# Patient Record
Sex: Female | Born: 1935
Health system: Southern US, Community
[De-identification: ages and names within clinical notes are randomized; demographics above are authoritative.]

## PROBLEM LIST (undated history)

## (undated) DIAGNOSIS — N183 Chronic kidney disease, stage 3 unspecified: Secondary | ICD-10-CM

## (undated) DIAGNOSIS — E890 Postprocedural hypothyroidism: Secondary | ICD-10-CM

## (undated) DIAGNOSIS — M949 Disorder of cartilage, unspecified: Secondary | ICD-10-CM

## (undated) DIAGNOSIS — E785 Hyperlipidemia, unspecified: Secondary | ICD-10-CM

## (undated) DIAGNOSIS — M899 Disorder of bone, unspecified: Secondary | ICD-10-CM

## (undated) DIAGNOSIS — C73 Malignant neoplasm of thyroid gland: Secondary | ICD-10-CM

## (undated) DIAGNOSIS — J309 Allergic rhinitis, unspecified: Secondary | ICD-10-CM

## (undated) HISTORY — PX: CERVICAL POLYPECTOMY: SHX88

## (undated) HISTORY — DX: Postprocedural hypothyroidism: E89.0

## (undated) HISTORY — DX: Allergic rhinitis, unspecified: J30.9

## (undated) HISTORY — PX: ABDOMINAL HYSTERECTOMY: SHX81

## (undated) HISTORY — DX: Malignant neoplasm of thyroid gland: C73

## (undated) HISTORY — DX: Hyperlipidemia, unspecified: E78.5

## (undated) HISTORY — DX: Disorder of bone, unspecified: M89.9

## (undated) HISTORY — DX: Disorder of cartilage, unspecified: M94.9

---

## 1999-04-14 ENCOUNTER — Other Ambulatory Visit: Admission: RE | Admit: 1999-04-14 | Discharge: 1999-04-14 | Payer: Self-pay | Admitting: Internal Medicine

## 1999-11-09 ENCOUNTER — Ambulatory Visit (HOSPITAL_COMMUNITY): Admission: RE | Admit: 1999-11-09 | Discharge: 1999-11-09 | Payer: Self-pay | Admitting: Internal Medicine

## 1999-11-09 ENCOUNTER — Encounter: Payer: Self-pay | Admitting: Internal Medicine

## 2004-02-23 ENCOUNTER — Ambulatory Visit: Payer: Self-pay | Admitting: Internal Medicine

## 2004-03-11 ENCOUNTER — Ambulatory Visit: Payer: Self-pay | Admitting: Internal Medicine

## 2004-05-26 ENCOUNTER — Other Ambulatory Visit: Admission: RE | Admit: 2004-05-26 | Discharge: 2004-05-26 | Payer: Self-pay | Admitting: Obstetrics and Gynecology

## 2004-07-06 ENCOUNTER — Ambulatory Visit: Payer: Self-pay | Admitting: Internal Medicine

## 2004-08-09 ENCOUNTER — Encounter (INDEPENDENT_AMBULATORY_CARE_PROVIDER_SITE_OTHER): Payer: Self-pay | Admitting: *Deleted

## 2004-08-09 ENCOUNTER — Inpatient Hospital Stay (HOSPITAL_COMMUNITY): Admission: RE | Admit: 2004-08-09 | Discharge: 2004-08-11 | Payer: Self-pay | Admitting: Obstetrics and Gynecology

## 2004-10-03 ENCOUNTER — Ambulatory Visit: Payer: Self-pay | Admitting: Internal Medicine

## 2005-02-09 ENCOUNTER — Ambulatory Visit: Payer: Self-pay | Admitting: Internal Medicine

## 2005-05-08 ENCOUNTER — Ambulatory Visit: Payer: Self-pay | Admitting: Internal Medicine

## 2005-06-01 ENCOUNTER — Ambulatory Visit: Payer: Self-pay | Admitting: Cardiology

## 2005-06-05 ENCOUNTER — Ambulatory Visit: Payer: Self-pay | Admitting: Internal Medicine

## 2005-06-12 ENCOUNTER — Other Ambulatory Visit: Admission: RE | Admit: 2005-06-12 | Discharge: 2005-06-12 | Payer: Self-pay | Admitting: Interventional Radiology

## 2005-06-12 ENCOUNTER — Encounter: Admission: RE | Admit: 2005-06-12 | Discharge: 2005-06-12 | Payer: Self-pay | Admitting: Internal Medicine

## 2005-06-12 ENCOUNTER — Encounter (INDEPENDENT_AMBULATORY_CARE_PROVIDER_SITE_OTHER): Payer: Self-pay | Admitting: *Deleted

## 2005-06-20 ENCOUNTER — Ambulatory Visit: Payer: Self-pay | Admitting: Internal Medicine

## 2005-08-01 ENCOUNTER — Other Ambulatory Visit: Admission: RE | Admit: 2005-08-01 | Discharge: 2005-08-01 | Payer: Self-pay | Admitting: Obstetrics and Gynecology

## 2005-08-21 ENCOUNTER — Encounter (INDEPENDENT_AMBULATORY_CARE_PROVIDER_SITE_OTHER): Payer: Self-pay | Admitting: *Deleted

## 2005-08-21 ENCOUNTER — Inpatient Hospital Stay (HOSPITAL_COMMUNITY): Admission: RE | Admit: 2005-08-21 | Discharge: 2005-08-22 | Payer: Self-pay | Admitting: Surgery

## 2005-09-27 ENCOUNTER — Ambulatory Visit: Payer: Self-pay | Admitting: Endocrinology

## 2005-11-22 ENCOUNTER — Encounter (INDEPENDENT_AMBULATORY_CARE_PROVIDER_SITE_OTHER): Payer: Self-pay | Admitting: *Deleted

## 2005-11-22 ENCOUNTER — Observation Stay (HOSPITAL_COMMUNITY): Admission: RE | Admit: 2005-11-22 | Discharge: 2005-11-23 | Payer: Self-pay | Admitting: Surgery

## 2005-12-13 ENCOUNTER — Ambulatory Visit: Payer: Self-pay | Admitting: Endocrinology

## 2005-12-26 ENCOUNTER — Ambulatory Visit: Payer: Self-pay | Admitting: Endocrinology

## 2006-01-08 ENCOUNTER — Ambulatory Visit: Payer: Self-pay | Admitting: Endocrinology

## 2006-01-16 ENCOUNTER — Encounter: Admission: RE | Admit: 2006-01-16 | Discharge: 2006-01-16 | Payer: Self-pay | Admitting: Endocrinology

## 2006-02-15 ENCOUNTER — Ambulatory Visit: Payer: Self-pay | Admitting: Internal Medicine

## 2006-02-19 ENCOUNTER — Encounter: Admission: RE | Admit: 2006-02-19 | Discharge: 2006-02-19 | Payer: Self-pay | Admitting: Endocrinology

## 2006-02-26 ENCOUNTER — Ambulatory Visit: Payer: Self-pay | Admitting: Endocrinology

## 2006-04-30 ENCOUNTER — Ambulatory Visit: Payer: Self-pay | Admitting: Endocrinology

## 2006-06-05 ENCOUNTER — Ambulatory Visit: Payer: Self-pay | Admitting: Endocrinology

## 2006-06-05 LAB — CONVERTED CEMR LAB: TSH: 25.33 microintl units/mL — ABNORMAL HIGH (ref 0.35–5.50)

## 2006-07-03 ENCOUNTER — Ambulatory Visit: Payer: Self-pay | Admitting: Internal Medicine

## 2006-07-23 ENCOUNTER — Ambulatory Visit: Payer: Self-pay | Admitting: Endocrinology

## 2006-07-23 LAB — CONVERTED CEMR LAB
Calcium, Total (PTH): 8.2 mg/dL — ABNORMAL LOW (ref 8.4–10.5)
PTH: 7.5 pg/mL — ABNORMAL LOW (ref 14.0–72.0)
TSH: 0.11 microintl units/mL — ABNORMAL LOW (ref 0.35–5.50)

## 2006-08-27 ENCOUNTER — Ambulatory Visit: Payer: Self-pay | Admitting: Endocrinology

## 2006-09-12 ENCOUNTER — Ambulatory Visit: Payer: Self-pay | Admitting: Endocrinology

## 2006-09-12 LAB — CONVERTED CEMR LAB: Thyroglobulin Ab: <30 (ref 0.0–60.0)

## 2006-09-18 ENCOUNTER — Encounter: Admission: RE | Admit: 2006-09-18 | Discharge: 2006-09-18 | Payer: Self-pay | Admitting: Endocrinology

## 2006-09-25 ENCOUNTER — Ambulatory Visit: Payer: Self-pay | Admitting: Endocrinology

## 2006-11-02 DIAGNOSIS — M899 Disorder of bone, unspecified: Secondary | ICD-10-CM

## 2006-11-02 DIAGNOSIS — J309 Allergic rhinitis, unspecified: Secondary | ICD-10-CM | POA: Insufficient documentation

## 2006-11-02 DIAGNOSIS — M949 Disorder of cartilage, unspecified: Secondary | ICD-10-CM

## 2006-11-02 HISTORY — DX: Disorder of bone, unspecified: M89.9

## 2006-11-02 HISTORY — DX: Allergic rhinitis, unspecified: J30.9

## 2006-11-04 DIAGNOSIS — E785 Hyperlipidemia, unspecified: Secondary | ICD-10-CM | POA: Insufficient documentation

## 2006-11-04 HISTORY — DX: Hyperlipidemia, unspecified: E78.5

## 2006-12-20 ENCOUNTER — Ambulatory Visit: Payer: Self-pay | Admitting: Endocrinology

## 2006-12-20 LAB — CONVERTED CEMR LAB: TSH: 0.03 microintl units/mL — ABNORMAL LOW (ref 0.35–5.50)

## 2007-01-23 ENCOUNTER — Ambulatory Visit: Payer: Self-pay | Admitting: Internal Medicine

## 2007-03-11 ENCOUNTER — Ambulatory Visit: Payer: Self-pay | Admitting: Internal Medicine

## 2007-03-11 ENCOUNTER — Telehealth: Payer: Self-pay | Admitting: Internal Medicine

## 2007-03-14 ENCOUNTER — Encounter: Payer: Self-pay | Admitting: Internal Medicine

## 2007-03-14 ENCOUNTER — Ambulatory Visit: Payer: Self-pay

## 2007-03-18 ENCOUNTER — Telehealth: Payer: Self-pay | Admitting: Internal Medicine

## 2007-03-28 ENCOUNTER — Ambulatory Visit: Payer: Self-pay | Admitting: Endocrinology

## 2007-03-28 DIAGNOSIS — E890 Postprocedural hypothyroidism: Secondary | ICD-10-CM

## 2007-03-28 DIAGNOSIS — C73 Malignant neoplasm of thyroid gland: Secondary | ICD-10-CM | POA: Insufficient documentation

## 2007-03-28 DIAGNOSIS — E209 Hypoparathyroidism, unspecified: Secondary | ICD-10-CM | POA: Insufficient documentation

## 2007-03-28 HISTORY — DX: Postprocedural hypothyroidism: E89.0

## 2007-03-28 HISTORY — DX: Malignant neoplasm of thyroid gland: C73

## 2007-04-01 ENCOUNTER — Ambulatory Visit: Payer: Self-pay | Admitting: Internal Medicine

## 2007-04-03 ENCOUNTER — Telehealth: Payer: Self-pay | Admitting: Endocrinology

## 2007-04-08 ENCOUNTER — Encounter (HOSPITAL_COMMUNITY): Admission: RE | Admit: 2007-04-08 | Discharge: 2007-04-10 | Payer: Self-pay | Admitting: Endocrinology

## 2007-04-11 ENCOUNTER — Encounter (HOSPITAL_COMMUNITY): Admission: RE | Admit: 2007-04-11 | Discharge: 2007-04-12 | Payer: Self-pay | Admitting: Endocrinology

## 2007-07-02 ENCOUNTER — Ambulatory Visit: Payer: Self-pay | Admitting: Internal Medicine

## 2007-09-24 ENCOUNTER — Ambulatory Visit: Payer: Self-pay | Admitting: Endocrinology

## 2007-09-24 LAB — CONVERTED CEMR LAB: TSH: 0.02 microintl units/mL — ABNORMAL LOW (ref 0.35–5.50)

## 2007-09-27 ENCOUNTER — Telehealth: Payer: Self-pay | Admitting: Internal Medicine

## 2007-11-06 ENCOUNTER — Ambulatory Visit: Payer: Self-pay | Admitting: Internal Medicine

## 2007-11-08 LAB — CONVERTED CEMR LAB
CO2: 33 meq/L — ABNORMAL HIGH (ref 19–32)
Chloride: 101 meq/L (ref 96–112)
Creatinine, Ser: 0.9 mg/dL (ref 0.4–1.2)
GFR calc Af Amer: 78 mL/min
GFR calc non Af Amer: 65 mL/min
HDL: 64.8 mg/dL (ref >39.0)
Sodium: 140 meq/L (ref 135–145)
TSH: 0.09 microintl units/mL — ABNORMAL LOW (ref 0.35–5.50)
Total CHOL/HDL Ratio: 3.2
Triglycerides: 72 mg/dL (ref 0–149)
VLDL: 14 mg/dL (ref 0–40)

## 2008-02-18 ENCOUNTER — Ambulatory Visit: Payer: Self-pay | Admitting: Internal Medicine

## 2008-03-23 ENCOUNTER — Ambulatory Visit: Payer: Self-pay | Admitting: Endocrinology

## 2008-03-23 LAB — CONVERTED CEMR LAB: TSH: 0.04 microintl units/mL — ABNORMAL LOW (ref 0.35–5.50)

## 2008-03-27 LAB — CONVERTED CEMR LAB: Calcium, Total (PTH): 8.7 mg/dL (ref 8.4–10.5)

## 2008-04-08 ENCOUNTER — Telehealth: Payer: Self-pay | Admitting: Internal Medicine

## 2008-04-15 ENCOUNTER — Encounter: Payer: Self-pay | Admitting: Internal Medicine

## 2008-05-21 ENCOUNTER — Ambulatory Visit: Payer: Self-pay | Admitting: Internal Medicine

## 2008-08-30 LAB — HM MAMMOGRAPHY

## 2008-09-08 LAB — CONVERTED CEMR LAB

## 2008-09-10 ENCOUNTER — Telehealth: Payer: Self-pay | Admitting: Internal Medicine

## 2008-09-29 ENCOUNTER — Encounter: Payer: Self-pay | Admitting: Internal Medicine

## 2008-09-30 ENCOUNTER — Ambulatory Visit: Payer: Self-pay | Admitting: Endocrinology

## 2008-09-30 LAB — CONVERTED CEMR LAB
Calcium, Total (PTH): 8.2 mg/dL — ABNORMAL LOW (ref 8.4–10.5)
PTH: 21.5 pg/mL (ref 14.0–72.0)
TSH: 0.13 microintl units/mL — ABNORMAL LOW (ref 0.35–5.50)

## 2008-11-25 ENCOUNTER — Ambulatory Visit: Payer: Self-pay | Admitting: Internal Medicine

## 2008-12-07 ENCOUNTER — Telehealth: Payer: Self-pay | Admitting: Internal Medicine

## 2008-12-11 ENCOUNTER — Encounter: Payer: Self-pay | Admitting: Internal Medicine

## 2009-01-14 ENCOUNTER — Ambulatory Visit: Payer: Self-pay | Admitting: Internal Medicine

## 2009-03-24 ENCOUNTER — Ambulatory Visit: Payer: Self-pay | Admitting: Endocrinology

## 2009-03-24 LAB — CONVERTED CEMR LAB: TSH: 0.02 microintl units/mL — ABNORMAL LOW (ref 0.35–5.50)

## 2009-03-29 ENCOUNTER — Encounter: Admission: RE | Admit: 2009-03-29 | Discharge: 2009-03-29 | Payer: Self-pay | Admitting: Endocrinology

## 2009-03-31 ENCOUNTER — Telehealth: Payer: Self-pay | Admitting: Endocrinology

## 2009-04-15 ENCOUNTER — Telehealth: Payer: Self-pay | Admitting: Endocrinology

## 2009-04-16 ENCOUNTER — Telehealth: Payer: Self-pay | Admitting: Endocrinology

## 2009-05-26 ENCOUNTER — Ambulatory Visit: Payer: Self-pay | Admitting: Internal Medicine

## 2009-05-27 LAB — CONVERTED CEMR LAB: TSH: 0.01 microintl units/mL — ABNORMAL LOW (ref 0.35–5.50)

## 2009-05-28 ENCOUNTER — Telehealth: Payer: Self-pay | Admitting: Endocrinology

## 2009-08-23 ENCOUNTER — Ambulatory Visit: Payer: Self-pay | Admitting: Internal Medicine

## 2009-08-26 ENCOUNTER — Ambulatory Visit: Payer: Self-pay | Admitting: Family Medicine

## 2009-09-27 ENCOUNTER — Ambulatory Visit: Payer: Self-pay | Admitting: Family Medicine

## 2009-09-27 DIAGNOSIS — L02419 Cutaneous abscess of limb, unspecified: Secondary | ICD-10-CM | POA: Insufficient documentation

## 2009-09-27 DIAGNOSIS — L03119 Cellulitis of unspecified part of limb: Secondary | ICD-10-CM

## 2009-09-28 ENCOUNTER — Ambulatory Visit: Payer: Self-pay | Admitting: Endocrinology

## 2009-09-28 ENCOUNTER — Ambulatory Visit: Payer: Self-pay | Admitting: Family Medicine

## 2009-09-28 LAB — CONVERTED CEMR LAB: Magnesium: 2.2 mg/dL (ref 1.5–2.5)

## 2009-09-29 ENCOUNTER — Encounter: Payer: Self-pay | Admitting: Endocrinology

## 2009-09-29 LAB — CONVERTED CEMR LAB
PTH: 28.2 pg/mL (ref 14.0–72.0)
Thyroglobulin Ab: <30 (ref 0.0–60.0)
Thyroperoxidase Ab SerPl-aCnc: 40.9 (ref 0.0–60.0)

## 2009-09-30 ENCOUNTER — Ambulatory Visit: Payer: Self-pay | Admitting: Family Medicine

## 2009-10-18 ENCOUNTER — Encounter: Admission: RE | Admit: 2009-10-18 | Discharge: 2009-10-18 | Payer: Self-pay | Admitting: Obstetrics and Gynecology

## 2009-11-03 ENCOUNTER — Encounter: Payer: Self-pay | Admitting: Internal Medicine

## 2009-11-03 ENCOUNTER — Ambulatory Visit: Payer: Self-pay | Admitting: Endocrinology

## 2009-11-04 LAB — CONVERTED CEMR LAB
Calcium, Total (PTH): 8.1 mg/dL — ABNORMAL LOW (ref 8.4–10.5)
PTH: 16.9 pg/mL (ref 14.0–72.0)

## 2009-12-01 ENCOUNTER — Encounter: Payer: Self-pay | Admitting: Family Medicine

## 2009-12-01 ENCOUNTER — Ambulatory Visit: Payer: Self-pay | Admitting: Family Medicine

## 2009-12-01 DIAGNOSIS — L0291 Cutaneous abscess, unspecified: Secondary | ICD-10-CM | POA: Insufficient documentation

## 2009-12-01 DIAGNOSIS — L0390 Cellulitis, unspecified: Secondary | ICD-10-CM

## 2009-12-03 ENCOUNTER — Ambulatory Visit: Payer: Self-pay | Admitting: Family Medicine

## 2009-12-03 DIAGNOSIS — IMO0002 Reserved for concepts with insufficient information to code with codable children: Secondary | ICD-10-CM | POA: Insufficient documentation

## 2009-12-04 ENCOUNTER — Encounter: Payer: Self-pay | Admitting: Internal Medicine

## 2010-01-11 ENCOUNTER — Ambulatory Visit: Payer: Self-pay | Admitting: Family Medicine

## 2010-02-23 ENCOUNTER — Ambulatory Visit: Payer: Self-pay | Admitting: Endocrinology

## 2010-02-23 LAB — CONVERTED CEMR LAB: Calcium, Total (PTH): 7.9 mg/dL — ABNORMAL LOW (ref 8.4–10.5)

## 2010-03-15 ENCOUNTER — Telehealth: Payer: Self-pay | Admitting: Internal Medicine

## 2010-05-01 ENCOUNTER — Encounter: Payer: Self-pay | Admitting: Endocrinology

## 2010-05-01 ENCOUNTER — Encounter: Payer: Self-pay | Admitting: Obstetrics and Gynecology

## 2010-05-12 NOTE — Assessment & Plan Note (Signed)
Summary: 2 DAY ROV/NJR   Vital Signs:  Patient profile:   77 year old female Weight:      144 pounds Temp:     97.8 degrees F oral BP sitting:   120 / 80  (left arm) Cuff size:   regular  Vitals Entered By: Duard Brady LPN (September 30, 2009 8:24 AM) CC: rov - cellulitis improving  Is Patient Diabetic? No   History of Present Illness: Pt here for f/u cellulitis thigh and buttock with draining abscess of buttock. Overall less pain.  Still some drainage but decreasing. No fever or chills.  Taking antibiotic and no adverse side effects. No new rashes.  Allergies (verified): No Known Drug Allergies  Past History:  Past Medical History: Last updated: 03/24/2009 MALIGNANT NEOPLASM OF THYROID GLAND, stage 2, follicular (ICD-193) 5/07:  right lobectomy--path shows 3 cm follicular ca 8/07:  completion left lobectomy, no more tumor seen 9/07  thyroglobulin 11.8 (ab neg) 11/07: i-131 rx, [facility] 6/08 thyroglogulin 0.2 (ab neg) 6/08: hypothyroid body scan neg (clinically tolerated hypothyroidism poorly) 6/09 tg undetectable (ab=1.5, normal) 12/10: tg undetectable (ab neg) HYPOTHYROIDISM, POSTSURGICAL (ICD-244.0) HYPOPARATHYROIDISM (ICD-252.1) LEG PAIN (ICD-729.5) HYPERLIPIDEMIA (ICD-272.4) OSTEOPENIA (ICD-733.90) ALLERGIC RHINITIS (ICD-477.9)  Review of Systems      See HPI  Physical Exam  General:  Well-developed,well-nourished,in no acute distress; alert,appropriate and cooperative throughout examination Heart:  normal rate and regular rhythm.   Skin:  greatly improved cellulits.  Erythema thigh almost resolved.  Buttock lesion not draining today.  Much less tender.   Impression & Recommendations:  Problem # 1:  CELLULITIS, THIGH (ICD-682.6) Assessment Improved finish out Keflex.  F/u with Dr Cato Mulligan as scheduled for med f/u.  Complete Medication List: 1)  Caltrate 600+d 600-400 Mg-unit Tabs (Calcium carbonate-vitamin d) .... Two times a day 2)  Zovirax 5 % Crea  (Acyclovir) .... Apply to lesion three times a day as needed outbreak of rash 3)  Centrum Silver Tabs (Multiple vitamins-minerals) .... Once a day 4)  Levothyroxine Sodium 100 Mcg Tabs (Levothyroxine sodium) .Marland Kitchen.. 1 once daily 5)  Calcitriol 0.25 Mcg Caps (Calcitriol) .Marland Kitchen.. 1 once daily  Patient Instructions: 1)  Continue sitz baths with epsom salts for another couple of days then should  be OK.
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
## 2010-05-12 NOTE — Assessment & Plan Note (Signed)
Summary: PAIN / SORE ON L ARM (INFECTED?) // RS   Vital Signs:  Patient profile:   77 year old female Temp:     97.5 degrees F oral BP sitting:   130 / 84  (left arm) Cuff size:   regular  Vitals Entered By: Sid Falcon LPN (Aug 26, 2009 11:52 AM) CC: Left arm red and itiching   History of Present Illness: L arm red since Monday this week.  Noted after friend's dog scratched arm.  No dog bite.  No fever.  tried peroxide and alcohol. redness spreading past couple of days. tetanus 2010.  Allergies (verified): No Known Drug Allergies  Past History:  Past Medical History: Last updated: 03/24/2009 MALIGNANT NEOPLASM OF THYROID GLAND, stage 2, follicular (ICD-193) 5/07:  right lobectomy--path shows 3 cm follicular ca 8/07:  completion left lobectomy, no more tumor seen 9/07  thyroglobulin 11.8 (ab neg) 11/07: i-131 rx, 101 [facility] 6/08 thyroglogulin 0.2 (ab neg) 6/08: hypothyroid body scan neg (clinically tolerated hypothyroidism poorly) 6/09 tg undetectable (ab=1.5, normal) 12/10: tg undetectable (ab neg) HYPOTHYROIDISM, POSTSURGICAL (ICD-244.0) HYPOPARATHYROIDISM (ICD-252.1) LEG PAIN (ICD-729.5) HYPERLIPIDEMIA (ICD-272.4) OSTEOPENIA (ICD-733.90) ALLERGIC RHINITIS (ICD-477.9) PMH reviewed for relevance  Review of Systems      See HPI  Physical Exam  General:  Well-developed,well-nourished,in no acute distress; alert,appropriate and cooperative throughout examination Lungs:  Normal respiratory effort, chest expands symmetrically. Lungs are clear to auscultation, no crackles or wheezes. Heart:  normal rate and regular rhythm.   Skin:  L prox forearm 5 X 7 cm area of erythema with mild tenderness.No drainage.  No pustules or vesicles.   Impression & Recommendations:  Problem # 1:  CELLULITIS AND ABSCESS OF UPPER ARM AND FOREARM (ICD-682.3)  Her updated medication list for this problem includes:    Cephalexin 500 Mg Caps (Cephalexin) ..... One by mouth three times a  day for 10 days  Orders: Prescription Created Electronically [phone])  Complete Medication List: 1)  Caltrate 600+d 600-400 Mg-unit Tabs (Calcium carbonate-vitamin d) .... Two times a day 2)  Zovirax 5 % Crea (Acyclovir) .... Apply to lesion three times a day as needed outbreak of rash 3)  Centrum Silver Tabs (Multiple vitamins-minerals) .... Once a day 4)  Levothyroxine Sodium 50 Mcg Tabs (Levothyroxine sodium) .... Take 1 tablet by mouth once a day 5)  Cephalexin 500 Mg Caps (Cephalexin) .... One by mouth three times a day for 10 days  Patient Instructions: 1)  use heating pad 2-3 times per day to forearm. 2)  Take your antibiotic as prescribed until ALL of it is gone, but stop if you develop a rash or swelling and contact our office as soon as possible.  3)  Follow up if you notice any progressive redness or fever. Prescriptions: CEPHALEXIN 500 MG CAPS (CEPHALEXIN) one by mouth three times a day for 10 days  #30 x 0   Entered and Authorized by:   Evelena Peat MD   Signed by:   Evelena Peat MD on 08/26/2009   Method used:   Electronically to        [facility] (retail)       [address]       [place], [place]  [address]       Ph: [phone]       Fax: [phone]   RxID:   [id number]

## 2010-05-12 NOTE — Assessment & Plan Note (Signed)
Summary: next day fup per dr burchette//ccm   Vital Signs:  Patient profile:   77 year old female Temp:     98.0 degrees F oral BP sitting:   150 / 80  (left arm) Cuff size:   regular  Vitals Entered By: Sid Falcon LPN (September 28, 2009 10:55 AM) CC: 1 day follow-up   History of Present Illness: Pt here for f/u cellulitis and abscess R buttock and cellulitis R ant thigh. Feels "100% better" today.  No fever or chills.  Cont to have some dranage. Less pain.  Able to sit better today. No nausea or vomiting.  Rocephin and started Keflex yesterday.  Allergies: No Known Drug Allergies  Past History:  Past Medical History: Last updated: 03/24/2009 MALIGNANT NEOPLASM OF THYROID GLAND, stage 2, follicular (ICD-193) 5/07:  right lobectomy--path shows 3 cm follicular ca 8/07:  completion left lobectomy, no more tumor seen 9/07  thyroglobulin 11.8 (ab neg) 11/07: i-131 rx, 101 [facility] 6/08 thyroglogulin 0.2 (ab neg) 6/08: hypothyroid body scan neg (clinically tolerated hypothyroidism poorly) 6/09 tg undetectable (ab=1.5, normal) 12/10: tg undetectable (ab neg) HYPOTHYROIDISM, POSTSURGICAL (ICD-244.0) HYPOPARATHYROIDISM (ICD-252.1) LEG PAIN (ICD-729.5) HYPERLIPIDEMIA (ICD-272.4) OSTEOPENIA (ICD-733.90) ALLERGIC RHINITIS (ICD-477.9)  Review of Systems      See HPI  Physical Exam  General:  Well-developed,well-nourished,in no acute distress; alert,appropriate and cooperative throughout examination Lungs:  Normal respiratory effort, chest expands symmetrically. Lungs are clear to auscultation, no crackles or wheezes. Heart:  normal rate and regular rhythm.   Skin:  R ant thigh less erythema and less tender.  R buttock-draining abscess with less drainage today than yesterday.  Erythema is less.   Impression & Recommendations:  Problem # 1:  CELLULITIS, THIGH (ICD-682.6) Assessment Improved Cont heating pad and oral antibiotics. Her updated medication list for this problem  includes:    Cephalexin 500 Mg Caps (Cephalexin) ..... One by mouth three times a day for 10 days  Complete Medication List: 1)  Caltrate 600+d 600-400 Mg-unit Tabs (Calcium carbonate-vitamin d) .... Two times a day 2)  Zovirax 5 % Crea (Acyclovir) .... Apply to lesion three times a day as needed outbreak of rash 3)  Centrum Silver Tabs (Multiple vitamins-minerals) .... Once a day 4)  Levothyroxine Sodium 50 Mcg Tabs (Levothyroxine sodium) .... Take 1 tablet by mouth once a day 5)  Cephalexin 500 Mg Caps (Cephalexin) .... One by mouth three times a day for 10 days  Patient Instructions: 1)  Consider daily bath with epsom salts to help with drainage. 2)  Change dressing daily. 3)  Office follow up in 2 days to reassess. 4)  Follow up sooner if you notice any fever or worsening rash.

## 2010-05-12 NOTE — Assessment & Plan Note (Signed)
Summary: Has a sore on hip/? insect bite/red around area/cjr   Vital Signs:  Patient profile:   77 year old female Weight:      145 pounds Temp:     98.7 degrees F oral BP sitting:   148 / 88  (left arm) Cuff size:   regular  Vitals Entered By: Sid Falcon LPN (September 27, 2009 3:32 PM) CC: Sore on right hip   History of Present Illness: Patient is seen with sore area right buttock for approximately one week. She initially thought was an insect bite. Earlier today had some spontaneous drainage of bleeding and pus. She denies any fever or chills. Also noticed earlier today red tender or warm spot right upper thigh. No history of MRSA. Recently placed on cephalexin for another unrelated problem and did well with that antibiotic.  Allergies (verified): No Known Drug Allergies  Past History:  Past Medical History: Last updated: 03/24/2009 MALIGNANT NEOPLASM OF THYROID GLAND, stage 2, follicular (ICD-193) 5/07:  right lobectomy--path shows 3 cm follicular ca 8/07:  completion left lobectomy, no more tumor seen 9/07  thyroglobulin 11.8 (ab neg) 11/07: i-131 rx, 101 [facility] 6/08 thyroglogulin 0.2 (ab neg) 6/08: hypothyroid body scan neg (clinically tolerated hypothyroidism poorly) 6/09 tg undetectable (ab=1.5, normal) 12/10: tg undetectable (ab neg) HYPOTHYROIDISM, POSTSURGICAL (ICD-244.0) HYPOPARATHYROIDISM (ICD-252.1) LEG PAIN (ICD-729.5) HYPERLIPIDEMIA (ICD-272.4) OSTEOPENIA (ICD-733.90) ALLERGIC RHINITIS (ICD-477.9)  Past Surgical History: Last updated: 11/02/2006 Hysterectomy Cervical polyps Colonoscopy-08/05/2003  Family History: Last updated: 11/06/2007 Family History Diabetes 1st degree relative---sister  Social History: Last updated: 05/26/2009 Married--widowed Never Smoked doesn't drive  Risk Factors: Smoking Status: never (05/26/2009) PMH-FH-SH reviewed for relevance  Review of Systems  The patient denies anorexia, fever, weight loss, abdominal pain,  hematuria, incontinence, muscle weakness, difficulty walking, and enlarged lymph nodes.    Physical Exam  General:  Well-developed,well-nourished,in no acute distress; alert,appropriate and cooperative throughout examination Lungs:  Normal respiratory effort, chest expands symmetrically. Lungs are clear to auscultation, no crackles or wheezes. Heart:  normal rate and regular rhythm.   Skin:  right buttock reveals she has area 7 x 12 centimeters of erythema. Tender to palpation and she has an area that is spontaneously draining and further purulence is expressed but there is no significant fluctuance. There is a little bloody drainage as well. Right anterior upper thigh reveals 9-by 15 cm area of erythema and slight tenderness and warmth. No pustules or evidence for abscess in this region.   Impression & Recommendations:  Problem # 1:  CELLULITIS, THIGH (ICD-682.6) Assessment New  patient has fairly severe cellulitis involving right thigh and buttock area with spontaneously draining abscess in the buttock region. Rocephin 1 g IM is given and start back Keflex 500 mg t.i.d. for 10 days and reassess tomorrow.  pt does not appear toxic. Her updated medication list for this problem includes:    Cephalexin 500 Mg Caps (Cephalexin) ..... One by mouth three times a day for 10 days  Orders: Rocephin  250mg  (Z6109) Admin of Therapeutic Inj  intramuscular or subcutaneous (60454)  Complete Medication List: 1)  Caltrate 600+d 600-400 Mg-unit Tabs (Calcium carbonate-vitamin d) .... Two times a day 2)  Zovirax 5 % Crea (Acyclovir) .... Apply to lesion three times a day as needed outbreak of rash 3)  Centrum Silver Tabs (Multiple vitamins-minerals) .... Once a day 4)  Levothyroxine Sodium 50 Mcg Tabs (Levothyroxine sodium) .... Take 1 tablet by mouth once a day 5)  Cephalexin 500 Mg Caps (Cephalexin) .... One by  mouth three times a day for 10 days  Patient Instructions: 1)  Schedule followup tomorrow  to reassess 2)  Use heating pad on low to moderate heat right thigh and right buttock area for the next few days 3)  start back cephalexin one tablet 3 times daily Prescriptions: CEPHALEXIN 500 MG CAPS (CEPHALEXIN) one by mouth three times a day for 10 days  #30 x 0   Entered and Authorized by:   Evelena Peat MD   Signed by:   Evelena Peat MD on 09/27/2009   Method used:   Electronically to        [facility] (retail)       [address]       [place], [place]  [address]       Ph: [phone]       Fax: [phone]   RxID:   [id number]    Medication Administration  Injection # 1:    Medication: Rocephin  250mg     Diagnosis: CELLULITIS, THIGH (ICD-682.6)    Route: IM    Site: RUOQ gluteus    Exp Date: 02/09/2012    Lot #: [id number]    Mfr: Sander Radon Plus    Patient tolerated injection without complications    Given by: Sid Falcon LPN (September 27, 2009 4:39 PM)  Orders Added: 1)  Est. Patient Level IV [40102] 2)  Rocephin  250mg  [J0696] 3)  Admin of Therapeutic Inj  intramuscular or subcutaneous [72536]

## 2010-05-12 NOTE — Progress Notes (Signed)
Summary: OTC med question  Phone Note Call from Patient Call back at [facility] Phone [phone]   Summary of Call: Patient called the office still c/o runny nose and sore throat stating that she was told by her drugist to get non-drowsy Claritin/Loratadine. She wants to know if this is ok per MD. Please advise. Initial call taken by: Lucious Groves,  April 16, 2009 4:00 PM  Follow-up for Phone Call        ok Follow-up by: Minus Breeding MD,  April 16, 2009 4:10 PM  Additional Follow-up for Phone Call Additional follow up Details #1::        Patient notified. Additional Follow-up by: Lucious Groves,  April 16, 2009 4:21 PM

## 2010-05-12 NOTE — Assessment & Plan Note (Signed)
Summary: flu shot//ccm  Nurse Visit   Allergies: No Known Drug Allergies  Immunizations Administered:  Influenza Vaccine # 1:    Vaccine Type: Fluvax MCR    Site: left deltoid    Mfr: GlaxoSmithKline    Dose: 0.5 ml    Route: IM    Given by: Kathrynn Speed CMA    Exp. Date: 10/08/2010    Lot #: [id number]    VIS given: 11/02/09 version given January 11, 2010.  Flu Vaccine Consent Questions:    Do you have a history of severe allergic reactions to this vaccine? no    Any prior history of allergic reactions to egg and/or gelatin? no    Do you have a sensitivity to the preservative Thimersol? no    Do you have a past history of Guillan-Barre Syndrome? no    Do you currently have an acute febrile illness? no    Have you ever had a severe reaction to latex? no    Vaccine information given and explained to patient? yes    Are you currently pregnant? no  Orders Added: 1)  Influenza Vaccine MCR [00025]

## 2010-05-12 NOTE — Assessment & Plan Note (Signed)
Summary: 2 DAY ROV/NJR   Vital Signs:  Patient profile:   77 year old female Weight:      140 pounds Temp:     97.9 degrees F oral BP sitting:   148 / 78  (left arm) Cuff size:   regular  Vitals Entered By: Sid Falcon LPN (December 03, 2009 11:44 AM) CC: 2 day follow-up   History of Present Illness: Patient seen for followup abscess buttock which has been draining some spontaneously. When seen 2 days ago this was not draining but indurated and nonfluctuant. We started doxycycline to cover for possible methicillin-resistant staph. Overall improved today with less pain and swelling. She is using saltwater soaks twice daily.  Since 2 days ago has developed small erythematous nodule with pustular center right axilla. Minimally painful.  No drainage.  Using heat topically.  Allergies (verified): No Known Drug Allergies  Past History:  Past Medical History: Last updated: 09/28/2009 MALIGNANT NEOPLASM OF THYROID GLAND, stage 2, follicular (ICD-193) 5/07:  right lobectomy--path shows 3 cm follicular ca 8/07:  completion left lobectomy, no more tumor seen 9/07  thyroglobulin 11.8 (ab neg) 11/07: i-131 rx, 101 [facility] 6/08 thyroglogulin 0.2 (ab neg) 6/08: hypothyroid body scan neg (clinically tolerated hypothyroidism poorly) 6/09 tg undetectable (ab=1.5, normal) 12/10: tg undetectable (ab neg) 6/11:  tg undetectable (ab neg) HYPOTHYROIDISM, POSTSURGICAL (ICD-244.0) HYPOPARATHYROIDISM (ICD-252.1) LEG PAIN (ICD-729.5) HYPERLIPIDEMIA (ICD-272.4) OSTEOPENIA (ICD-733.90) ALLERGIC RHINITIS (ICD-477.9) PMH reviewed for relevance  Review of Systems  The patient denies anorexia, fever, and weight loss.    Physical Exam  General:  Well-developed,well-nourished,in no acute distress; alert,appropriate and cooperative throughout examination Lungs:  Normal respiratory effort, chest expands symmetrically. Lungs are clear to auscultation, no crackles or wheezes. Heart:  normal rate and  regular rhythm.   Extremities:  right axillary region reveals about 1/2 cm area of erythema and slight swelling in induration. Very small pustular center which is unroofed with needle and cultured.  Minimal purulence with pressure. Skin:  right buttock reveals spontaneously draining wound with very minimal drainage at this time. Much less erythema and tenderness compared to 2 days ago. No fluctuance.   Impression & Recommendations:  Problem # 1:  ABSCESS, AXILLA, RIGHT (ICD-682.3) Assessment New this was opened with syringe and cultured to rule out MRSA. Continue doxycycline and warm soaks. Her updated medication list for this problem includes:    Doxycycline Hyclate 100 Mg Caps (Doxycycline hyclate) ..... One by mouth two times a day for 10 days  Orders: T-Culture, Wound ([id number]) I&D Abscess, Simple / Single (10060)  Problem # 2:  CELLULITIS AND ABSCESS OF BUTTOCK (ICD-682.5) Assessment: Improved follow up with Dr Cato Mulligan next week. Her updated medication list for this problem includes:    Doxycycline Hyclate 100 Mg Caps (Doxycycline hyclate) ..... One by mouth two times a day for 10 days  Complete Medication List: 1)  Caltrate 600+d 600-400 Mg-unit Tabs (Calcium carbonate-vitamin d) .... Two times a day 2)  Zovirax 5 % Crea (Acyclovir) .... Apply to lesion three times a day as needed outbreak of rash 3)  Centrum Silver Tabs (Multiple vitamins-minerals) .... Once a day 4)  Calcitriol 0.25 Mcg Caps (Calcitriol) .Marland Kitchen.. 1 once daily 5)  Levothyroxine Sodium 112 Mcg Tabs (Levothyroxine sodium) .Marland Kitchen.. 1 once daily 6)  Doxycycline Hyclate 100 Mg Caps (Doxycycline hyclate) .... One by mouth two times a day for 10 days

## 2010-05-12 NOTE — Assessment & Plan Note (Signed)
Vital Signs:  Patient profile:   77 year old female Temp:     98.0 degrees F oral BP sitting:   130 / 78  (left arm) Cuff size:   regular  Vitals Entered By: Sid Falcon LPN (December 01, 2009 11:48 AM) CC: Rt hip ongoing pain   History of Present Illness: Patient seen with a sore area on her right buttock.  back in June of this year she had cellulitis and spontaneously draining abscess of the right buttock area. She improved with Keflex at that time. No known history of MRSA. Current place has been sore for approximately one week. Denies fever or chills. She is doing warm soaks with minimal improvement.  Allergies (verified): No Known Drug Allergies  Past History:  Past Medical History: Last updated: 09/28/2009 MALIGNANT NEOPLASM OF THYROID GLAND, stage 2, follicular (ICD-193) 5/07:  right lobectomy--path shows 3 cm follicular ca 8/07:  completion left lobectomy, no more tumor seen 9/07  thyroglobulin 11.8 (ab neg) 11/07: i-131 rx, [facility] 6/08 thyroglogulin 0.2 (ab neg) 6/08: hypothyroid body scan neg (clinically tolerated hypothyroidism poorly) 6/09 tg undetectable (ab=1.5, normal) 12/10: tg undetectable (ab neg) 6/11:  tg undetectable (ab neg) HYPOTHYROIDISM, POSTSURGICAL (ICD-244.0) HYPOPARATHYROIDISM (ICD-252.1) LEG PAIN (ICD-729.5) HYPERLIPIDEMIA (ICD-272.4) OSTEOPENIA (ICD-733.90) ALLERGIC RHINITIS (ICD-477.9) PMH reviewed for relevance  Review of Systems  The patient denies fever and weight loss.    Physical Exam  General:  Well-developed,well-nourished,in no acute distress; alert,appropriate and cooperative throughout examination Lungs:  Normal respiratory effort, chest expands symmetrically. Lungs are clear to auscultation, no crackles or wheezes. Heart:  normal rate and regular rhythm.   Skin:  patient has area of cellulitis approximately 3 x 4 cm right buttock. In the center she has an indurated area.  NO fluctuance.  Slightly crusted central area  suggesting this has drained somewhat.   Impression & Recommendations:  Problem # 1:  CELLULITIS AND ABSCESS OF BUTTOCK (ICD-682.5) Assessment New  patient may have early abscess but this is nonfluctuant currently. Given recurrence consider possibility of MRSA. Start doxycycline continue warm soaks needs reassessment couple days  Her updated medication list for this problem includes:    Doxycycline Hyclate 100 Mg Caps (Doxycycline hyclate) ..... One by mouth two times a day for 10 days  Complete Medication List: 1)  Caltrate 600+d 600-400 Mg-unit Tabs (Calcium carbonate-vitamin d) .... Two times a day 2)  Zovirax 5 % Crea (Acyclovir) .... Apply to lesion three times a day as needed outbreak of rash 3)  Centrum Silver Tabs (Multiple vitamins-minerals) .... Once a day 4)  Calcitriol 0.25 Mcg Caps (Calcitriol) .Marland Kitchen.. 1 once daily 5)  Levothyroxine Sodium 112 Mcg Tabs (Levothyroxine sodium) .Marland Kitchen.. 1 once daily 6)  Doxycycline Hyclate 100 Mg Caps (Doxycycline hyclate) .... One by mouth two times a day for 10 days  Patient Instructions: 1)  Schedule follow up in 2 days to reassess 2)  Continue warm baths daily. Prescriptions: DOXYCYCLINE HYCLATE 100 MG CAPS (DOXYCYCLINE HYCLATE) one by mouth two times a day for 10 days  #20 x 0   Entered and Authorized by:   Evelena Peat MD   Signed by:   Evelena Peat MD on 12/01/2009   Method used:   Electronically to        [facility] (retail)       [address]       [place], [place]  [address]  Ph: [phone]       Fax: [phone]   RxID:   [id number]

## 2010-05-12 NOTE — Assessment & Plan Note (Signed)
Summary: Follow up    Vital Signs:  Patient profile:   77 year old female Height:      63 inches (160.02 cm) Weight:      138.50 pounds (62.95 kg) BMI:     24.62 O2 Sat:      97 % on Room air Temp:     97.8 degrees F (36.56 degrees C) oral Pulse rate:   67 / minute BP sitting:   128 / 70  (left arm) Cuff size:   regular  Vitals Entered By: Brenton Grills CMA Duncan Dull) (February 23, 2010 8:12 AM)  O2 Flow:  Room air CC: Follow-up visit/aj Is Patient Diabetic? No   Primary Provider:  Birdie Sons MD  CC:  Follow-up visit/aj.  History of Present Illness: pt has postsurgical hypothyroidism: denies numbness pt has postsurgical hypoparathyroidism. denies muscle weakness   Current Medications (verified): 1)  Caltrate 600+d 600-400 Mg-Unit  Tabs (Calcium Carbonate-Vitamin D) .... Two Times A Day 2)  Zovirax 5 % Crea (Acyclovir) .... Apply To Lesion Three Times A Day As Needed Outbreak of Rash 3)  Centrum Silver  Tabs (Multiple Vitamins-Minerals) .... Once A Day 4)  Calcitriol 0.25 Mcg Caps (Calcitriol) .Marland Kitchen.. 1 Once Daily 5)  Levothyroxine Sodium 112 Mcg Tabs (Levothyroxine Sodium) .Marland Kitchen.. 1 Once Daily  Allergies (verified): No Known Drug Allergies  Past History:  Past Medical History: MALIGNANT NEOPLASM OF THYROID GLAND, stage 2, follicular (ICD-193) 5/07:  right lobectomy--path shows 3 cm follicular ca 8/07:  completion left lobectomy, no more tumor seen 9/07  thyroglobulin 11.8 (ab neg) 11/07: i-131 rx, [facility] 6/08 thyroglogulin 0.2 (ab neg) 6/08: hypothyroid body scan neg (clinically tolerated hypothyroidism poorly) 6/09 tg undetectable (ab=1.5, normal) 12/10: tg undetectable (ab neg) 6/11:  tg undetectable (ab neg) HYPOTHYROIDISM, POSTSURGICAL (ICD-244.0) HYPOPARATHYROIDISM (ICD-252.1) LEG PAIN (ICD-729.5) HYPERLIPIDEMIA (ICD-272.4) OSTEOPENIA (ICD-733.90) ALLERGIC RHINITIS (ICD-477.9)  Review of Systems  The patient denies weight loss and weight gain.     denies seizure.   Physical Exam  General:  normal appearance.   Neck:  a healed scar is present.  i do not appreciate a nodule in the thyroid or elsewhere in the neck  Additional Exam:  Parathyroid Hormone       23.2 pg/mL                  14.0-72.0 Calcium              [L]  7.9 mg/dL   FastTSH              [L]  0.03 uIU/mL       Impression & Recommendations:  Problem # 1:  HYPOPARATHYROIDISM (ICD-252.1) pt declines changing rocaltrol to forteo needs increased rx  Problem # 2:  HYPOTHYROIDISM, POSTSURGICAL (ICD-244.0) in view of the big change in her tsh recently, with a small change in herdosage, i favor continuing the same synthroid  Medications Added to Medication List This Visit: 1)  Calcitriol 0.5 Mcg Caps (Calcitriol) .Marland Kitchen.. 1 tab once daily  Other Orders: T-Parathyroid Hormone, Intact w/ Calcium ([address]454) TLB-TSH (Thyroid Stimulating Hormone) (84443-TSH) Est. Patient Level III ([address])  Patient Instructions: 1)  blood tests are being ordered for you today.  please call [phone] to hear your test results. 2)  Please schedule a follow-up appointment in 6 months. 3)  (update: i left message on phone-tree:  increase rocaltrol to 0.5 micrograms/day) Prescriptions: CALCITRIOL 0.5 MCG CAPS (CALCITRIOL) 1 tab once daily  #30 x 11   Entered and  Authorized by:   Minus Breeding MD   Signed by:   Minus Breeding MD on 02/24/2010   Method used:   Electronically to        [facility] (retail)       [address]       [place], [place]  [address]       Ph: [phone]       Fax: [phone]   RxID:   [id number]    Orders Added: 1)  T-Parathyroid Hormone, Intact w/ Calcium [87564-33295] 2)  TLB-TSH (Thyroid Stimulating Hormone) [84443-TSH] 3)  Est. Patient Level III [[address]]

## 2010-05-12 NOTE — Assessment & Plan Note (Signed)
Summary: FOLLOW UP-LB   Vital Signs:  Patient profile:   77 year old female Height:      63 inches (160.02 cm) Weight:      146 pounds (66.36 kg) BMI:     25.96 O2 Sat:      96 % on Room air Temp:     98.3 degrees F (36.83 degrees C) oral Pulse rate:   66 / minute BP sitting:   114 / 80  (left arm) Cuff size:   regular  Vitals Entered By: Brenton Grills MA (September 28, 2009 1:15 PM)  O2 Flow:  Room air CC: endo f/u/aj   Primary Provider:  Birdie Sons MD  CC:  endo f/u/aj.  History of Present Illness: the status of at least 3 ongoing medical problems is addressed today: thyroid cancer:  she does not notice any nodule at the neck.   hypoparathyroidism:  she denies cramps.   postsurgical hypothyroidism:  she denies numbness.  she takes synthroid only 50 micrograms/day.  Current Medications (verified): 1)  Caltrate 600+d 600-400 Mg-Unit  Tabs (Calcium Carbonate-Vitamin D) .... Two Times A Day 2)  Zovirax 5 % Crea (Acyclovir) .... Apply To Lesion Three Times A Day As Needed Outbreak of Rash 3)  Centrum Silver  Tabs (Multiple Vitamins-Minerals) .... Once A Day 4)  Levothyroxine Sodium 50 Mcg Tabs (Levothyroxine Sodium) .... Take 1 Tablet By Mouth Once A Day 5)  Cephalexin 500 Mg Caps (Cephalexin) .... One By Mouth Three Times A Day For 10 Days  Allergies (verified): No Known Drug Allergies  Past History:  Past Medical History: MALIGNANT NEOPLASM OF THYROID GLAND, stage 2, follicular (ICD-193) 5/07:  right lobectomy--path shows 3 cm follicular ca 8/07:  completion left lobectomy, no more tumor seen 9/07  thyroglobulin 11.8 (ab neg) 11/07: i-131 rx, 101 [facility] 6/08 thyroglogulin 0.2 (ab neg) 6/08: hypothyroid body scan neg (clinically tolerated hypothyroidism poorly) 6/09 tg undetectable (ab=1.5, normal) 12/10: tg undetectable (ab neg) 6/11:  tg undetectable (ab neg) HYPOTHYROIDISM, POSTSURGICAL (ICD-244.0) HYPOPARATHYROIDISM (ICD-252.1) LEG PAIN (ICD-729.5) HYPERLIPIDEMIA  (ICD-272.4) OSTEOPENIA (ICD-733.90) ALLERGIC RHINITIS (ICD-477.9)  Review of Systems  The patient denies weight loss and weight gain.    Physical Exam  General:  normal appearance.   Neck:  a healed scar is present.  i do not appreciate a nodule in the thyroid or elsewhere in the neck  Additional Exam:  FastTSH              [H]  18.66 uIU/mL                0.35-5.50 Magnesium                 2.2 mg/dL                   5.6-4.3 Thyroglobulin Antibody           <30.0 U/mL                  0.0-60.0 Thyroglobulin             <0.2 ng/mL                  0.0-55.0    Parathyroid Hormone       28.2 pg/mL                  14.0-72.0   Calcium              [L]  7.2 mg/dL  Impression & Recommendations:  Problem # 1:  MALIGNANT NEOPLASM OF THYROID GLAND (ICD-193) stage 2 follicular adenocarcinoma no evidence of recurrence  Problem # 2:  HYPOTHYROIDISM, POSTSURGICAL (ICD-244.0) needs increased rx  Problem # 3:  HYPOPARATHYROIDISM (ICD-252.1) needs increased rx  Medications Added to Medication List This Visit: 1)  Levothyroxine Sodium 100 Mcg Tabs (Levothyroxine sodium) .Marland Kitchen.. 1 once daily 2)  Calcitriol 0.25 Mcg Caps (Calcitriol) .Marland Kitchen.. 1 once daily  Other Orders: T-Parathyroid Hormone, Intact w/ Calcium (44010-27253) T-Thyroglobulin Panel (66440, [id number]) TLB-TSH (Thyroid Stimulating Hormone) (84443-TSH) TLB-Magnesium (Mg) (83735-MG) Est. Patient Level IV (87564)  Patient Instructions: 1)  blood tests are being ordered for you today.  please call [phone] to hear your test results. 2)  pending the test results, please increase levothyroxine to 100 micrograms/day. 3)  go to lab in 1 month for tsh 244.0 and parathyroid hormone 252.1 4)  (update: i left message on phone-tree:  add rocaltrol 0.25 micrograms/day). Prescriptions: CALCITRIOL 0.25 MCG CAPS (CALCITRIOL) 1 once daily  #30 x 11   Entered and Authorized by:   Minus Breeding MD   Signed by:   Minus Breeding MD on  09/29/2009   Method used:   Electronically to        [facility] (retail)       [address]       [place], [place]  [address]       Ph: [phone]       Fax: [phone]   RxID:   [id number] LEVOTHYROXINE SODIUM 100 MCG TABS (LEVOTHYROXINE SODIUM) 1 once daily  #30 x 11   Entered and Authorized by:   Minus Breeding MD   Signed by:   Minus Breeding MD on 09/28/2009   Method used:   Electronically to        [facility] (retail)       [address]       [place], [place]  [address]       Ph: [phone]       Fax: [phone]   RxID:   [id number]

## 2010-05-12 NOTE — Progress Notes (Signed)
Summary: OTC meds?  Phone Note Call from Patient   Caller: Patient Summary of Call: pt called stating that she has a running nose, ST and cough. pt would like to know what OTC medications are okay to take with current meds? Initial call taken by: Margaret Pyle, CMA,  April 15, 2009 11:04 AM  Follow-up for Phone Call        only restriction is that you should avoid decongestants.  take saline nasal spray instead. Follow-up by: Minus Breeding MD,  April 15, 2009 11:21 AM  Additional Follow-up for Phone Call Additional follow up Details #1::        pt imformed and willgot to local pharmacy to buy Tylenol cold without decongestant and saline spray. Additional Follow-up by: Margaret Pyle, CMA,  April 15, 2009 11:29 AM

## 2010-05-12 NOTE — Progress Notes (Signed)
Summary: Pt req called req refill of Zovirax Cream  Phone Note Refill Request Call back at Home Phone [phone] Message from:  Patient on March 15, 2010 4:08 PM  Refills Requested: Medication #1:  ZOVIRAX 5 % CREA apply to lesion three times a day as needed outbreak of rash   Dosage confirmed as above?Dosage Confirmed   Supply Requested: 1 month Pls call in to [facility] on [address]    Caryn Section Requested: Telephone to Pharmacy Initial call taken by: Lucy Antigua,  March 15, 2010 4:08 PM  Follow-up for Phone Call        Rx called to pharmacy Follow-up by: Alfred Levins, CMA,  March 15, 2010 5:06 PM    Prescriptions: ZOVIRAX 5 % CREA (ACYCLOVIR) apply to lesion three times a day as needed outbreak of rash  #1 tube x 3   Entered by:   Alfred Levins, CMA   Authorized by:   Birdie Sons MD   Signed by:   Alfred Levins, CMA on 03/15/2010   Method used:   Electronically to        [facility] (retail)       [address]       [place], [place]  [address]       Ph: [phone]       Fax: [phone]   RxID:   [id number]

## 2010-05-12 NOTE — Progress Notes (Signed)
Summary: Sythroid reduction?  Phone Note Call from Patient   Caller: Patient [phone] Summary of Call: pt's daughter called stating that pt's PCP Dr. Cato Mulligan wants to reduce pt's Sythroid from to . pt's daughter is requesting MD opinion. please advise Initial call taken by: Margaret Pyle, CMA,  May 28, 2009 11:52 AM  Follow-up for Phone Call        yes, that is ok.  please erepeat the blood test (tsh) in 1 month 244.0 Follow-up by: Minus Breeding MD,  May 28, 2009 12:40 PM  Additional Follow-up for Phone Call Additional follow up Details #1::        pt's daughter informed and will have pt repeat blood work at PCP in 1 month Additional Follow-up by: Margaret Pyle, CMA,  May 28, 2009 1:45 PM

## 2010-08-23 NOTE — Consult Note (Signed)
[facility]                          ENDOCRINOLOGY CONSULTATION   NAME:[name], [name] WENGERT                       MRN:          [id number]  DATE:08/27/2006                            DOB:          1927-10-15    REASON FOR VISIT:  Follow up thyroid cancer.   HISTORY OF PRESENT ILLNESS:  1. This is a 77 year old woman, who had Iodine-131 therapy for her      follicular adenocarcinoma of the thyroid five months ago.  She does      not notice any nodule at the neck nor elsewhere.  2. Regarding her postoperative hypothyroidism, she currently takes      Synthroid 150 mcg a day.  3. Regarding her postoperative hypoparathyroidism, she does not take      Rocaltrol, but has only required a calcium supplement so far.   PAST MEDICAL HISTORY:  Otherwise healthy.  Osteoporosis.   REVIEW OF SYSTEMS:  She denies any change in her weight.   PHYSICAL EXAMINATION:  VITAL SIGNS:  Blood pressure 121/60, heart rate  72, temp is 97.2, the weight is 143.  GENERAL:  No distress.  NECK:  No nodule is palpable at the thyroid or elsewhere at the neck.   LABORATORY STUDIES:  On Aug 22, 2006, parathyroid hormone 7.5, calcium  8.2, TSH 0.11.   IMPRESSION:  1. Follicular adenocarcinoma of the thyroid, ready for rescanning.  2. Regarding her postoperative hypothyroidism, her Synthroid is at an      appropriate dosage (she is currently on Cytomel to prepare her for      her scanning).  3. Postoperative hypoparathyroidism.  It should be noted that the      recommendation in the setting of thyroid cancer is to slightly over      treat hypothyroidism, and to slightly under treat      hypoparathyroidism.   PLAN:  1. Continue your calcium supplement.  2. Discontinue the Cytomel.  3. Check a TSH and thyroglobulin on June 4th.  4. Radioiodine dose for scanning June 9th.  5. Scan in Nuclear Medicine Department June 12th.  6. Return here approximately June 13th or 16th.     Sean  A. Everardo All, MD  Electronically Signed    SAE/MedQ  DD: 08/29/2006  DT: 08/29/2006  Job #: [id number]   cc:   [name] Mole. Swords, MD

## 2010-08-23 NOTE — Consult Note (Signed)
[facility]                          ENDOCRINOLOGY CONSULTATION   NAME:[name], [name] WIDDOWSON                       MRN:          [id number]  DATE:09/25/2006                            DOB:          [birth year]    REASON FOR VISIT:  Follow up thyroid cancer.   HISTORY OF THE PRESENT ILLNESS:  A 77 year old woman who is recently  status post followup scanning for stage II follicular adenocarcinoma of  the thyroid.   Regarding her postoperative hypothyroidism, her appropriate dosage was  Synthroid 150 mcg a day.  She has been feeling very tired since she has  been off this and is anxious to resume.   PAST MEDICAL HISTORY:  Osteoporosis.   REVIEW OF SYSTEMS:  She has gained a few pounds since her last visit.   PHYSICAL EXAMINATION:  Blood pressure is 133/79, heart rate 65,  temperature is 97.5, her weight is 148.  GENERAL:  No distress.  NECK:  She has a healed surgical scar.  There is no palpable nodule in  the neck.   LABORATORY STUDIES:  On September 21, 2006, nuclear medicine total body scan  is negative.  On September 12, 2006, thyroglobulin is 0.2 ng/mL.  Thyroglobulin antibody is negative.   IMPRESSION:  1. Stage II follicular adenocarcinoma of the thyroid, status post      successful surgical resection and followup radioactive iodine      ablation of the remnant.  No evidence of recurrent or persistent      disease.  2. Postoperative hypothyroidism.   PLAN:  1. Resume Synthroid 150 mcg a day.  2. Return in about 5 months.     Sean A. Everardo All, MD  Electronically Signed    SAE/MedQ  DD: 09/27/2006  DT: 09/27/2006  Job #: [id number]   cc:   Valetta Mole. Swords, MD

## 2010-08-26 NOTE — Op Note (Signed)
NAMELANESHIA, PINA                ACCOUNT NO.:  [id number]   MEDICAL RECORD NO.:  [id number]          PATIENT TYPE:  INP   LOCATION:  0002                         FACILITY:  [facility]   PHYSICIAN:  Michelle L. Grewal, M.D.DATE OF BIRTH:  Feb 09, 1928   DATE OF PROCEDURE:  08/09/2004  DATE OF DISCHARGE:                                 OPERATIVE REPORT   PREOPERATIVE DIAGNOSES:  Total procidentia, stress urinary incontinence.   POSTOPERATIVE DIAGNOSES:  Total procidentia stress urinary incontinence.   PROCEDURE:  Total vaginal hysterectomy, anterior and posterior repair.   SURGEON:  Michelle L. Vincente Poli, M.D.   ASSISTANT:  Duke Salvia. Marcelle Overlie, M.D.   ANESTHESIA:  General.   SPECIMENS:  Uterus and cervix.   ESTIMATED BLOOD LOSS:  300 mL.   COMPLICATIONS:  None.   DESCRIPTION OF PROCEDURE:  The patient was taken to the operating room, she  was given informed consent. She was prepped and draped in the usual sterile  fashion, Foley catheter is inserted into the bladder and draining clear  urine. Total procidentia is noted at the time the patient is placed in  stirrups. She is prepped and draped in the usual sterile fashion. A  circumferential incision was performed around the cervix and carried up and  we then dissected with sharp and blunt dissection the vaginal epithelium  away from the cervix. The posterior cul-de-sac was entered sharply. We then  used curved Heaney clamps to clamp just beside the cervix on either side.  Each pedicle was cut and suture ligated using #0 Vicryl suture. We worked  our way up along the broad ligament and then entered the cul-de-sac  anteriorly using Metzenbaum scissors. The uterus was extremely small, the  cervix was approximately 4 cm long. We then retroflexed the uterus and  placed a curved Heaney clamp across the triple pedicle on either side. Each  pedicle was cut and suture ligated using #0 Vicryl suture. After the vaginal  hysterectomy was  performed, hemostasis was excellent. We placed a small  sponge in the peritoneal cavity, inspected all pedicles, pedicles were  hemostatic. At this point, we had performed our anterior repair where we  placed Allis clamps on either side and then dissected the vesicovaginal  fascia away from the vaginal epithelium, made a midline incision all the way  from the vaginal cuff back to the UV angle. We then reduced the cystocele  with sharp and blunt dissection, the cystocele was reduced using three  pursestring sutures using #0 Vicryl suture and at that point I trimmed off  the redundant vaginal epithelium. Dr. Wanda Plump then scrubbed in and  performed a pubovaginal sling and cystoscopy which that will be dictated by  him. We then closed the anterior vaginal cuff using 2-0 Vicryl in continuous  running locked stitch. At that point, I then placed the stitch in the cul-de-  sac using #0 Vicryl suture and closed the peritoneal cavity. I then placed  Allis clamps at the perineum at 5 and 7 o'clock, made a V shaped incision  and worked my way up all the way up to the  vaginal cuff posteriorly to do  the posterior repair using Strulley scissors. The rectocele was reduced  using sharp and blunt dissection with excellent hemostasis. The excess  vaginal epithelium was trimmed and then I closed the vaginal cuff as well as  the posterior incision using 2-0 Vicryl in continuous running locked stitch.  Hemostasis  was excellent. A vaginal packing with Estrace cream was inserted into the  vagina. Dr. Wanda Plump did assist me for the posterior repair. All sponge,  lap and instrument counts were correct x2. She was extubated and went to the  recovery room in stable condition.      MLG/MEDQ  D:  08/09/2004  T:  08/09/2004  Job:  [id number]

## 2010-08-26 NOTE — Op Note (Signed)
[name] [name], [name] [name]                ACCOUNT NO.:  [id number]   MEDICAL RECORD NO.:  [id number]          PATIENT TYPE:  INP   LOCATION:  0006                         FACILITY:  [facility]   PHYSICIAN:  Currie Paris, M.D.DATE OF BIRTH:  February 15, 1928   DATE OF PROCEDURE:  11/22/2005  DATE OF DISCHARGE:                                 OPERATIVE REPORT   OFFICE MEDICAL RECORD NUMBER:  [id number].   PREOPERATIVE DIAGNOSIS:  Carcinoma, right lobe of thyroid, status post right  thyroid lobectomy.   POSTOPERATIVE DIAGNOSIS:  Carcinoma, right lobe of thyroid, status post  right thyroid lobectomy.   OPERATION:  Completion thyroidectomy.   SURGEON:  Currie Paris, M.D.   ASSISTANT:  Ollen Gross. Carolynne Edouard, M.D.   ANESTHESIA:  General endotracheal.   CLINICAL HISTORY:  This is a 77 year old lady who had a large thyroid nodule  and underwent right thyroid lobectomy about 3 months ago.  Preliminary  pathology was benign but final pathology indicated a low-grade about 3 cm  carcinoma.  After evaluation by her endocrinologist, Dr. Everardo All, and  further discussions with me, she elected to proceed to a completion  thyroidectomy.  Of note was the fact that on the right thyroid lobe she had  an intrathyroidal parathyroid gland noted, which was obviously removed at  the time of surgery.  She was therefore missing at least one parathyroid   DESCRIPTION OF PROCEDURE:  The patient was seen in the holding area and she  had no further questions.  She was taken to the operating room and after  satisfactory general endotracheal anesthesia had been obtained, the neck was  prepped and draped.  The time-out occurred.   The old scar was opened and extended a little bit to the left side.  The  subcutaneous tissue and scar where the platysma used to be was divided and  subplatysmal flaps raised in the usual fashion.  The midline pretracheal  fascia was opened.  The thyroid was identified and the left lobe  dissected  off of the overlying strap muscles.   With downward traction I was able to identify the superior pole and gently  get around this and tie it off in continuity with 2-0 silk.  I also put a  clip on the stay side so that there was both a clip and a tie and the  superior pole was divided.  With the thyroid rotated medially, I was able to  divide some of the small vessels coming up onto the thyroid.  I identified  the superior parathyroid, dissected that off and let it rotate laterally.  Then what appeared to be the inferior parathyroid was also dissected off,  rotated laterally, keeping my dissection in the capsule of the thyroid and  the surgical plane of the thyroid.   As I rotated this medially I saw small branch of the arteries coming in, and  these were clipped individually.  The nerve was identified and I was able to  trace it up into the larynx in its normal anatomic position.  It was  carefully avoided.   As  I got close to the nerve I left a little thyroid behind on it so that I  did not impinge on that or have too much traction.  The remaining  attachments of the thyroid to the trachea were divided and the lobectomy  completed.   Careful palpation of the neck revealed no significant adenopathy, so no  further neck dissection was carried out.  There was no apparent thyroid  pyramidal lobe of the thyroid.   I made sure everything was dry and then put a little Surgicel along of the  area of the nerve just to be sure there was no oozing from this tiny bit of  residual thyroid.  The incision was closed in layers in the usual fashion  with 3-0 Vicryl, followed by some staples on the skin.   The patient tolerated the procedure well.  There were no operative  complications.  All counts were correct.      Currie Paris, M.D.  Electronically Signed     CJS/MEDQ  D:  11/22/2005  T:  11/22/2005  Job:  [id number]   cc:   Valetta Mole. Swords, MD  [address]  [place]  [place] [address]   Gregary Signs A. Everardo All, MD  [address]  [place]  [place] [address]

## 2010-08-26 NOTE — Op Note (Signed)
[name] [name], [name] [name]                ACCOUNT NO.:  [id number]   MEDICAL RECORD NO.:  [id number]          PATIENT TYPE:  INP   LOCATION:  0162                         FACILITY:  [facility]   PHYSICIAN:  Currie Paris, M.D.DATE OF BIRTH:  26-Nov-1927   DATE OF PROCEDURE:  08/21/2005  DATE OF DISCHARGE:                                 OPERATIVE REPORT   CCS [phone]   PREOPERATIVE DIAGNOSIS:  Right thyroid nodule.   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   OPERATION PERFORMED:  Right thyroid lobectomy with isthmusectomy.   SURGEON:  Currie Paris, M.D.   ASSISTANT:  Sheppard Plumber. Earlene Plater, M.D.   ANESTHESIA:  General.   INDICATIONS FOR PROCEDURE:  This is a 77 year old with a fairly large  hypervascular nodule of the right lobe and a couple of other smaller nodules  in both lobes.  Fine needle aspiration showed some atypical follicular cells  with a large dominant nodule.   DESCRIPTION OF PROCEDURE:  The patient was seen in the holding area and had  no further questions.  I confirmed right thyroid lobectomy with possible  total thyroidectomy as the planned procedure.   The patient was taken to the operating room and after satisfactory general  anesthesia had been obtained, she was positioned on the operating table with  the neck extended.  It was then prepped and draped.  The time out occurred.   I made a transverse incision about one fingerbreadth above the clavicles and  divided this platysma.  The subplatysmal flaps were raised in the usual  fashion and the self-retaining retractor was placed.  The midline fascia was  opened and the right lobe exposed.   I was able to free up and divide a couple of small inferior pole veins that  were very medially located to get better mobility here.  The muscle was  somewhat stuck to the superior pole where the biopsy had occurred.  I peeled  this off and then was able to expose the  superior pole vessels.  These were  ligated individually with clips and 2-0 silk ties ligating in continuity.  This gave me more mobility.   The middle vein was identified, clipped and divided.  Then further  dissection in the tracheo-esophageal groove allowed me to identify the  recurrent laryngeal nerve and traced it as it came out to the larynx.  Once  that was done, I began dividing in the surgical plane of the thyroid to  sweep what looked like superior parathyroid back down towards the nerve and  pulled the thyroid up and swept that material laterally and posterior again  keeping close attention to the nerve.  Several blood vessels were clipped or  cauterized.  Once I had this freed off the nerve, I then used cautery to  divide the thyroid off of the trachea. This was somewhat tethered and stuck  and I did supeficially enter into the trachea with the cautery but not all  the way through into the lumen.  Once I got across the midline, I divided  the isthmus on the left side of the midline and suture ligated with 2-0  silk.   I put a little Surgicel where there had been a little oozing around the  nerve.  This was dry at the end of the case.  I put several 4-0 Vicryl  sutures to be sure that the area where we had superficially entered into the  surface of the trachea were well sealed and there were no air leaks.   Once we had irrigated and everything was dry and the surgical site had  remained dry, I went ahead and closed using 4-0 Vicryl to close the midline  and the platysma and staples on the skin.  I held a little pressure for a  few minutes.  We waited about 25 minutes until pathology reported back that  the giant nodule appeared to be a Hurthle cell lesion.  It was well  circumscribed but no further assessment of benign or malignant nature could  be made on frozen section.  She also had nodules consistent with  multinodular goiter.   At this point we concluded the procedure.   She tolerated the procedure well.  There were no complications otherwise noted.  All counts were correct.      Currie Paris, M.D.  Electronically Signed     CJS/MEDQ  D:  08/21/2005  T:  08/22/2005  Job:  [id number]   cc:   Valetta Mole. Swords, M.D. [facility]  [address] [place]  [place] [address]

## 2010-08-26 NOTE — Op Note (Signed)
[name] [name], [name] [name]                ACCOUNT NO.:  [id number]   MEDICAL RECORD NO.:  [id number]          PATIENT TYPE:  INP   LOCATION:  0002                         FACILITY:  [facility]   PHYSICIAN:  Boston Service, M.D.DATE OF BIRTH:  1928/03/28   DATE OF PROCEDURE:  08/09/2004  DATE OF DISCHARGE:                                 OPERATIVE REPORT   INTERNAL MEDICINE:  Valetta Mole. Swords, M.D.   GYNECOLOGY:  Marcelle Overlie, M.D.   UROLOGY:  Boston Service, M.D.   PREOPERATIVE DIAGNOSES:  Complete uterine prolapse, rectocele, cystocele,  enterocele and stress urinary incontinence.   PROCEDURE:  Pubovaginal sling, fascial sling 15 x 3 cm.   POSTOPERATIVE DIAGNOSES:  Complete uterine prolapse, rectocele, cystocele,  enterocele and stress urinary incontinence.   DESCRIPTION OF PROCEDURE:  After Dr. Vincente Poli and Dr. Marcelle Overlie had completed  their portion of the procedure, an anterior incision along the vaginal wall  was left open. Index finger was used to create a space lateral to the  urethra, posterior to the symphysis pubis. Indwelling Foley catheter ensured  that the bladder was emptied. Dissection was carried up to the posterior  aspect of the rectus sheath on both the right and left side. Transverse  incision was then made suprapubically about 1 cm above the symphysis pubis,  carried down through the skin and subcutaneous layers to the fascia of the  rectus abdominis. Single Stamey needle was then passed lateral to the  bladder neck on both the right and left sides using the index finger within  the retropubic space as a guide. With right and left Stamey needles in  place, the catheter was removed. The cystoscope was inserted. The bladder  was carefully inspected with the 70 and 12 degrees lenses, showed no  evidence of bladder perforation. The trigone appeared to be well away from  the path of the needles. Clear efflux right and left side without evidence  of compromise or  obstruction. Cystoscope was removed. Foley catheter was  reinserted and left to straight drain. A 15 x 3 cm sling of fascia had been  prepared with a whipstitch of #1 nylon at either end. Tails of the nylon  sutures were brought through the eyelets of the Stamey needles and they were  retracted superiorly. [place] eye needle was used to separate the two ends of  the nylon stitch. The butt end of a DeBakey clamp was then passed between  the urethra and the fascial sling.  The sling was tied down with no undue tension on the urethra. Tails of the  two-line nylon stitches were then tied together in the midline. Foley  catheter was left to straight drain. The skin was closed with skin staples  after irrigation of the wound. Dr. Vincente Poli will dictate her portion of the  procedure.      RH/MEDQ  D:  08/09/2004  T:  08/09/2004  Job:  [id number]   cc:   Marcelino Duster L. Vincente Poli, M.D.  [address]  [place]  [place] [address]  Fax: [phone]   Valetta Mole. Swords, M.D. [facility]

## 2010-08-31 ENCOUNTER — Encounter: Payer: Self-pay | Admitting: Family Medicine

## 2010-08-31 ENCOUNTER — Ambulatory Visit (INDEPENDENT_AMBULATORY_CARE_PROVIDER_SITE_OTHER): Payer: Medicare Other | Admitting: Family Medicine

## 2010-08-31 VITALS — BP 120/80 | Temp 98.0°F | Ht 63.25 in | Wt 137.0 lb

## 2010-08-31 DIAGNOSIS — S0990XA Unspecified injury of head, initial encounter: Secondary | ICD-10-CM

## 2010-08-31 NOTE — Progress Notes (Signed)
  Subjective:    Patient ID: [name] [name], female    DOB: 12/15/1927, 77 y.o.   MRN: [id number]  HPI Patient seen with closed head injury. Occurred yesterday. She was working in the garden and hit in the head inadvertently with a large piece of wood. She fell to the ground but there was no loss of consciousness.  Injury was to left  Forehead.  She denies any headache, nausea, vomiting, focal weakness, extreme fatigue, dizziness, double vision, or knee other symptoms at this time. Gait is normal. Used ice after injury.  No anticoagulant use.  Her chronic counts include history of allergic rhinitis, osteopenia and past history of thyroid cancer.  Past Medical History  Diagnosis Date  . Malignant neoplasm of thyroid gland 03/28/2007  . HYPOTHYROIDISM, POSTSURGICAL 03/28/2007  . HYPERLIPIDEMIA 11/04/2006  . ALLERGIC RHINITIS 11/02/2006  . OSTEOPENIA 11/02/2006   Past Surgical History  Procedure Date  . Abdominal hysterectomy   . Cervical polypectomy     reports that she has never smoked. She does not have any smokeless tobacco history on file. Her alcohol and drug histories not on file. family history includes Diabetes in her sister. No Known Allergies   Review of Systems  Constitutional: Negative for appetite change, fatigue and unexpected weight change.  HENT: Negative for ear pain, neck pain and neck stiffness.   Eyes: Negative for pain and visual disturbance.  Cardiovascular: Negative for chest pain.  Neurological: Negative for dizziness, seizures, syncope, weakness, light-headedness and headaches.  Hematological: Negative for adenopathy.  Psychiatric/Behavioral: Negative for confusion.       Objective:   Physical Exam  Constitutional: She is oriented to person, place, and time. She appears well-developed and well-nourished. No distress.  HENT:  Head: Normocephalic.  Right Ear: External ear normal.  Left Ear: External ear normal.  Mouth/Throat: Oropharynx is clear and moist.        Patient has some mild swelling and ecchymosis left frontal region. Mildly tender to palpation. No periorbital tenderness.  Eyes: Pupils are equal, round, and reactive to light.  Neck: Neck supple.  Cardiovascular: Normal rate, regular rhythm and normal heart sounds.   Pulmonary/Chest: Effort normal and breath sounds normal. No respiratory distress. She has no wheezes. She has no rales.  Lymphadenopathy:    She has no cervical adenopathy.  Neurological: She is alert and oriented to person, place, and time.       Gait is normal. Cerebellar function normal. No focal strength deficits.  Skin: No rash noted.  Psychiatric: She has a normal mood and affect. Her behavior is normal.          Assessment & Plan:  Contusion left forehead.  She denies any cognitive impairment or any red flags such as nausea, vomiting, seizure, excessive malaise, or any focal neurologic symptoms. Head injury sheet given. Follow up promptly for any changes symptoms

## 2010-08-31 NOTE — Patient Instructions (Signed)
Head Injuries, Adult You have had a head injury which does not appear serious at this time. A concussion is a state of changed mental ability, usually from a blow to the head. You should take clear liquids for the rest of the day and then resume your regular diet. You should not take sedatives or alcoholic beverages for 48 hours after discharge. After injuries such as yours, most problems occur within the first 24 hours.  THESE MINOR SYMPTOMS MAY BE SEEN AFTER DISCHARGE:  Memory difficulties  Dizziness   Headaches   Double vision  Hearing difficulties   Depression   Tiredness  Weakness   Difficulty with concentration   If you experience any of these problems, you should not be alarmed. A concussion requires a few days for recovery. Many patients with head injuries frequently experience such symptoms. Usually, these problems disappear without medical care. If symptoms last for more than one day, notify your caregiver. See your caregiver sooner if symptoms are becoming worse rather than better. HOME CARE INSTRUCTIONS  During the next 24 hours you must stay with someone who can watch you for the warning signs listed below.  Although it is unlikely that serious side effects will occur, you should be aware of signs and symptoms which may necessitate your return to this location. Side effects may occur up to 7 - 10 days following the injury. It is important for you to carefully monitor your condition and contact your caregiver or seek immediate medical attention if there is a change in your condition. 1. SEEK IMMEDIATE MEDICAL CARE IF:   There is confusion or drowsiness.   You can not awaken the injured person.   There is nausea (feeling sick to your stomach) or continued, forceful vomiting.   You notice dizziness or unsteadiness which is getting worse, or inability to walk.   You have convulsions or unconsciousness.   You experience severe, persistent headaches not relieved by  over-the-counter or prescription medicines for pain. (Do not take aspirin as this impairs clotting abilities). Take other pain medications only as directed.   You can not use arms or legs normally.   There is clear or bloody discharge from the nose or ears.  MAKE SURE YOU:   Understand these instructions.   Will watch your condition.   Will get help right away if you are not doing well or get worse.  Document Released: 03/27/2005 Document Re-Released: 03/15/2009 [employer] Patient Information 2011 [employer]. 

## 2010-09-07 ENCOUNTER — Encounter: Payer: Self-pay | Admitting: Family Medicine

## 2010-09-07 ENCOUNTER — Ambulatory Visit (INDEPENDENT_AMBULATORY_CARE_PROVIDER_SITE_OTHER): Payer: Medicare Other | Admitting: Family Medicine

## 2010-09-07 VITALS — BP 120/70 | Temp 98.7°F

## 2010-09-07 DIAGNOSIS — L03119 Cellulitis of unspecified part of limb: Secondary | ICD-10-CM

## 2010-09-07 DIAGNOSIS — L02419 Cutaneous abscess of limb, unspecified: Secondary | ICD-10-CM

## 2010-09-07 MED ORDER — DOXYCYCLINE HYCLATE 100 MG PO CAPS: 100.0000 mg | ORAL_CAPSULE | Freq: Two times a day (BID) | ORAL | Status: AC

## 2010-09-07 NOTE — Patient Instructions (Signed)
Heating pad to leg few times daily. Follow up promptly for any fever or worsening redness or pain.

## 2010-09-07 NOTE — Progress Notes (Signed)
  Subjective:    Patient ID: [name] [name], female    DOB: [birth year], 77 y.o.   MRN: [id number]  HPI Patient seen with erythematous slightly tender slightly swollen area left medial calf first noted yesterday. She has had history of abscess in the past. No known history of MRSA. Denies recent leg injury. No fever or chills.  She noted small pustular center yesterday but none today. No drainage today.  Recent head injury. No headaches or any focal neurologic symptoms. Denies dizziness.   Review of Systems  Constitutional: Negative for fever, chills and fatigue.  Skin: Positive for rash.       Objective:   Physical Exam  Constitutional: She appears well-developed and well-nourished. No distress.  Cardiovascular: Normal rate, regular rhythm and normal heart sounds.   Pulmonary/Chest: Effort normal and breath sounds normal. No respiratory distress. She has no wheezes. She has no rales.  Skin:       Left upper medial calf reveals 3 x 5 cm area of erythema. Small punctate non-pustular center. No fluctuance          Assessment & Plan:  Cellulitis left calf possibly related to staph with history of small pustular center initially. No evidence for abscess. Doxycycline 100 mg twice daily for 10 days and warm compresses several times daily

## 2010-09-12 ENCOUNTER — Encounter: Payer: Self-pay | Admitting: *Deleted

## 2010-09-13 ENCOUNTER — Encounter: Payer: Self-pay | Admitting: Endocrinology

## 2010-09-13 ENCOUNTER — Other Ambulatory Visit (INDEPENDENT_AMBULATORY_CARE_PROVIDER_SITE_OTHER): Payer: Medicare Other

## 2010-09-13 ENCOUNTER — Telehealth: Payer: Self-pay | Admitting: Endocrinology

## 2010-09-13 ENCOUNTER — Ambulatory Visit (INDEPENDENT_AMBULATORY_CARE_PROVIDER_SITE_OTHER): Payer: Medicare Other | Admitting: Endocrinology

## 2010-09-13 DIAGNOSIS — E209 Hypoparathyroidism, unspecified: Secondary | ICD-10-CM

## 2010-09-13 DIAGNOSIS — E890 Postprocedural hypothyroidism: Secondary | ICD-10-CM

## 2010-09-13 DIAGNOSIS — C73 Malignant neoplasm of thyroid gland: Secondary | ICD-10-CM

## 2010-09-13 MED ORDER — LEVOTHYROXINE SODIUM 88 MCG PO TABS: 88.0000 ug | ORAL_TABLET | Freq: Every day | ORAL | Status: DC

## 2010-09-13 NOTE — Progress Notes (Signed)
  Subjective:    Patient ID: [name] [name], female    DOB: Dec 19, 1927, 77 y.o.   MRN: [id number]  HPI The state of at least three ongoing medical problems is addressed today: Stage-2 follicular adenocarcinoma of the thyroid.  She does not notice any nodule at the neck. Postsurgical hypothyroidism:  Denies weight change. Postsurgical hypoparathyroidism:  Denies cramps. Past Medical History  Diagnosis Date  . Malignant neoplasm of thyroid gland 03/28/2007    5/07: righte lobectomy--path shows 3 cm follicular ca 8/07: completion left lobectomy, no more tumor seen 09/07: thyroglobulin 0.2 9ab neg) 11/07: i-131 rx, [facility] 6/08: thyroglogulin 0.2 (ab neg) , hypothyroid body scan neg (clinically tolerated hypothyroidism poorly) 06/09: tg undetectable (ab=1.5, normal) 12/10: tg undetectable (ab neg) 6/11: tg undectable (ab neg)  . HYPOTHYROIDISM, POSTSURGICAL 03/28/2007  . HYPERLIPIDEMIA 11/04/2006  . ALLERGIC RHINITIS 11/02/2006  . OSTEOPENIA 11/02/2006    Past Surgical History  Procedure Date  . Abdominal hysterectomy   . Cervical polypectomy     History   Social History  . Marital Status: Married    Spouse Name: N/A    Number of Children: N/A  . Years of Education: N/A   Occupational History  . Not on file.   Social History Main Topics  . Smoking status: Never Smoker   . Smokeless tobacco: Not on file  . Alcohol Use: Not on file  . Drug Use: Not on file  . Sexually Active: Not on file   Other Topics Concern  . Not on file   Social History Narrative   Doesn't drive    Current Outpatient Prescriptions on File Prior to Visit  Medication Sig Dispense Refill  . acyclovir (ZOVIRAX) 5 % cream Apply topically every 3 (three) hours.        . calcium carbonate (OS-CAL) 600 MG TABS Take 600 mg by mouth 2 (two) times daily with a meal.        . doxycycline (VIBRAMYCIN) 100 MG capsule Take 1 capsule (100 mg total) by mouth 2 (two) times daily.  20 capsule  0  . levothyroxine  (SYNTHROID, LEVOTHROID) 112 MCG tablet Take 112 mcg by mouth daily.          No Known Allergies  Family History  Problem Relation Age of Onset  . Diabetes Sister     There were no vitals taken for this visit.    Review of Systems Denies neck pain and numbness.     Objective:   Physical Exam GENERAL: no distress Neck: a healed scar is present.  i do not appreciate a nodule in the thyroid or elsewhere in the neck        Assessment & Plan:  Stage-2 follicular adenocarcinoma of the thyroid: no evidence of recurrence Postsurgical hypothyroidism: in view of her age, she can now have a goal of a normal tsh Postsurgical hypoparathyroidism.  She has not required rocaltrol.

## 2010-09-13 NOTE — Patient Instructions (Addendum)
blood tests are being ordered for you today.  please call [phone] to hear your test results.  You will be prompted to enter the 9-digit "MRN" number that appears at the top left of this page, followed by #.  Then you will hear the message. Please make a follow-up appointment in 1 year.

## 2010-09-13 NOTE — Telephone Encounter (Signed)
i left message on phone tree Reduce synthroid to 88 mcg/day.  Go to lab in 1 month for tsh.

## 2010-09-14 ENCOUNTER — Telehealth: Payer: Self-pay | Admitting: Endocrinology

## 2010-09-14 DIAGNOSIS — E209 Hypoparathyroidism, unspecified: Secondary | ICD-10-CM

## 2010-09-14 LAB — THYROGLOBULIN LEVEL: Thyroglobulin: <0.2 ng/mL (ref 0.0–55.0)

## 2010-09-14 LAB — PTH, INTACT AND CALCIUM
Calcium, Total (PTH): 8.3 mg/dL — ABNORMAL LOW (ref 8.4–10.5)
PTH: 16.8 pg/mL (ref 14.0–72.0)

## 2010-09-14 MED ORDER — CALCITRIOL 0.25 MCG PO CAPS: 0.2500 ug | ORAL_CAPSULE | ORAL | Status: DC

## 2010-09-14 NOTE — Telephone Encounter (Signed)
i left message on phone tree: Add rocaltrol 0.25 mcg tiw (m, w, f) Recheck pth in 1 month

## 2010-10-27 ENCOUNTER — Other Ambulatory Visit (INDEPENDENT_AMBULATORY_CARE_PROVIDER_SITE_OTHER): Payer: Medicare Other

## 2010-10-27 DIAGNOSIS — E890 Postprocedural hypothyroidism: Secondary | ICD-10-CM

## 2010-10-27 DIAGNOSIS — E209 Hypoparathyroidism, unspecified: Secondary | ICD-10-CM

## 2010-10-27 MED ORDER — LEVOTHYROXINE SODIUM 75 MCG PO TABS: 75.0000 ug | ORAL_TABLET | Freq: Every day | ORAL | Status: DC

## 2010-10-27 NOTE — Progress Notes (Signed)
  Subjective:    Patient ID: [name] [name], female    DOB: 1928-04-05, 77 y.o.   MRN: [id number]  HPI    Review of Systems     Objective:   Physical Exam        Assessment & Plan:

## 2010-10-27 NOTE — Patient Instructions (Signed)
i left message on phone tree Reduce synthroid to 75/d Ret 2013

## 2010-10-28 LAB — PTH, INTACT AND CALCIUM: PTH: 16.3 pg/mL (ref 14.0–72.0)

## 2010-12-28 LAB — THYROGLOBULIN LEVEL

## 2011-01-09 ENCOUNTER — Ambulatory Visit (INDEPENDENT_AMBULATORY_CARE_PROVIDER_SITE_OTHER): Payer: Medicare Other | Admitting: Internal Medicine

## 2011-01-09 ENCOUNTER — Ambulatory Visit: Payer: Medicare Other

## 2011-01-09 DIAGNOSIS — Z23 Encounter for immunization: Secondary | ICD-10-CM

## 2011-01-20 ENCOUNTER — Telehealth: Payer: Self-pay | Admitting: Internal Medicine

## 2011-01-20 MED ORDER — ACYCLOVIR 5 % EX CREA: 1.0000 "application " | TOPICAL_CREAM | CUTANEOUS | Status: DC

## 2011-01-20 NOTE — Telephone Encounter (Signed)
Refill Zovirax 5% cream to [facility] on [address]. Thanks.

## 2011-01-20 NOTE — Telephone Encounter (Signed)
rx sent in electronically 

## 2011-03-12 ENCOUNTER — Emergency Department (HOSPITAL_COMMUNITY)
Admission: EM | Admit: 2011-03-12 | Discharge: 2011-03-13 | Disposition: A | Discharge status: Home / Self Care | Payer: Medicare Other | Attending: Emergency Medicine | Admitting: Emergency Medicine

## 2011-03-12 ENCOUNTER — Emergency Department (HOSPITAL_COMMUNITY): Payer: Medicare Other

## 2011-03-12 ENCOUNTER — Encounter (HOSPITAL_COMMUNITY): Payer: Self-pay | Admitting: Emergency Medicine

## 2011-03-12 DIAGNOSIS — R5383 Other fatigue: Secondary | ICD-10-CM | POA: Insufficient documentation

## 2011-03-12 DIAGNOSIS — R059 Cough, unspecified: Secondary | ICD-10-CM | POA: Insufficient documentation

## 2011-03-12 DIAGNOSIS — B9789 Other viral agents as the cause of diseases classified elsewhere: Secondary | ICD-10-CM | POA: Insufficient documentation

## 2011-03-12 DIAGNOSIS — E785 Hyperlipidemia, unspecified: Secondary | ICD-10-CM | POA: Insufficient documentation

## 2011-03-12 DIAGNOSIS — B349 Viral infection, unspecified: Secondary | ICD-10-CM

## 2011-03-12 DIAGNOSIS — E039 Hypothyroidism, unspecified: Secondary | ICD-10-CM | POA: Insufficient documentation

## 2011-03-12 DIAGNOSIS — R5381 Other malaise: Secondary | ICD-10-CM | POA: Insufficient documentation

## 2011-03-12 DIAGNOSIS — R05 Cough: Secondary | ICD-10-CM | POA: Insufficient documentation

## 2011-03-12 DIAGNOSIS — R112 Nausea with vomiting, unspecified: Secondary | ICD-10-CM | POA: Insufficient documentation

## 2011-03-12 LAB — BASIC METABOLIC PANEL
BUN: 17 mg/dL (ref 6–23)
CO2: 28 mEq/L (ref 19–32)
Chloride: 94 mEq/L — ABNORMAL LOW (ref 96–112)
GFR calc non Af Amer: 57 mL/min — ABNORMAL LOW (ref >90)
Glucose, Bld: 162 mg/dL — ABNORMAL HIGH (ref 70–99)
Potassium: 3.6 mEq/L (ref 3.5–5.1)

## 2011-03-12 LAB — DIFFERENTIAL
Eosinophils Absolute: 0 10*3/uL (ref 0.0–0.7)
Lymphocytes Relative: 3 % — ABNORMAL LOW (ref 12–46)
Lymphs Abs: 0.5 10*3/uL — ABNORMAL LOW (ref 0.7–4.0)
Monocytes Relative: 5 % (ref 3–12)
Neutrophils Relative %: 92 % — ABNORMAL HIGH (ref 43–77)

## 2011-03-12 LAB — CBC
Hemoglobin: 13.3 g/dL (ref 12.0–15.0)
MCH: 27.8 pg (ref 26.0–34.0)
RBC: 4.78 MIL/uL (ref 3.87–5.11)
WBC: 16.6 10*3/uL — ABNORMAL HIGH (ref 4.0–10.5)

## 2011-03-12 MED ORDER — SODIUM CHLORIDE 0.9 % IV BOLUS (SEPSIS)
500.0000 mL | Freq: Once | INTRAVENOUS | Status: AC
  Administered 2011-03-12: 1000 mL via INTRAVENOUS

## 2011-03-12 MED ORDER — MORPHINE SULFATE 4 MG/ML IJ SOLN
4.0000 mg | Freq: Once | INTRAMUSCULAR | Status: AC
  Administered 2011-03-12: 4 mg via INTRAVENOUS
  Filled 2011-03-12: qty 1

## 2011-03-12 MED ORDER — ONDANSETRON HCL 4 MG/2ML IJ SOLN
4.0000 mg | Freq: Once | INTRAMUSCULAR | Status: AC
  Administered 2011-03-12: 4 mg via INTRAVENOUS
  Filled 2011-03-12: qty 2

## 2011-03-12 MED ORDER — SODIUM CHLORIDE 0.9 % IV SOLN: INTRAVENOUS | Status: DC

## 2011-03-12 NOTE — ED Notes (Signed)
(  Denies: pain, sob,nd, bleeding, fever), here fro  Cough and weakness, c/o general weakness, sore from coughing, vomited x1 PTA (questionably post tussive emesis), coughing up thick yellow sputum regularly, mentions: wears dentures (upper & lower), has bilateral hearing aids, no assistive devices used to walk at home, husband at [facility].

## 2011-03-12 NOTE — ED Notes (Signed)
C/o nausea, vomiting, and productive cough with yellow phlegm since yesterday morning.  Reports generalized weakness.

## 2011-03-12 NOTE — ED Notes (Signed)
To x-ray in w/c

## 2011-03-12 NOTE — ED Notes (Signed)
IV team returned call to start IV

## 2011-03-12 NOTE — ED Notes (Signed)
Patient presents from stretcher with c/o cough since Friday night, slight fever.  Today her cough has gotten worse and fever increased.

## 2011-03-12 NOTE — ED Notes (Signed)
Assisted to the bathroom in a wheelchair but was unable to go

## 2011-03-13 LAB — URINALYSIS, ROUTINE W REFLEX MICROSCOPIC
Glucose, UA: NEGATIVE mg/dL
Ketones, ur: 15 mg/dL — AB
Nitrite: NEGATIVE
Protein, ur: 100 mg/dL — AB
Specific Gravity, Urine: 1.027 (ref 1.005–1.030)
Urobilinogen, UA: 1 mg/dL (ref 0.0–1.0)
pH: 5.5 (ref 5.0–8.0)

## 2011-03-13 LAB — URINE MICROSCOPIC-ADD ON

## 2011-03-13 MED ORDER — HYDROCOD POLST-CHLORPHEN POLST 10-8 MG/5ML PO LQCR: 5.0000 mL | Freq: Two times a day (BID) | ORAL | Status: DC | PRN

## 2011-03-13 MED ORDER — ONDANSETRON HCL 4 MG/2ML IJ SOLN
4.0000 mg | Freq: Once | INTRAMUSCULAR | Status: AC
  Administered 2011-03-13: 4 mg via INTRAVENOUS
  Filled 2011-03-13: qty 2

## 2011-03-13 MED ORDER — ONDANSETRON 8 MG PO TBDP: 8.0000 mg | ORAL_TABLET | Freq: Three times a day (TID) | ORAL | Status: AC | PRN

## 2011-03-13 NOTE — ED Provider Notes (Signed)
History     CSN: [id number] Arrival date & time: 03/12/2011  6:37 PM   First MD Initiated Contact with Patient 03/12/11 2107      Chief Complaint  Patient presents with  . Emesis     Patient is a 77 y.o. female presenting with cough. The history is provided by the patient.  Cough This is a new problem. The current episode started more than 2 days ago. The problem occurs every few minutes. The problem has been gradually worsening. The cough is productive of sputum. Associated symptoms include chills. Pertinent negatives include no chest pain. She is not a smoker. Her past medical history does not include pneumonia, COPD or asthma.   patient is an 77 year old ill-appearing female who presents with complaint of persistent productive cough since Friday. Yesterday patient began to feel as if she had a fever with chills several episodes of nausea and vomiting today. Reports increasing weakness with persistent coughing and vomiting. Denies abdominal pain, diarrhea, urinary tract infection symptoms or other associated complaints.   Past Medical History  Diagnosis Date  . Malignant neoplasm of thyroid gland 03/28/2007    5/07: righte lobectomy--path shows 3 cm follicular ca 8/07: completion left lobectomy, no more tumor seen 09/07: thyroglobulin 0.2 9ab neg) 11/07: i-131 rx, [facility] 6/08: thyroglogulin 0.2 (ab neg) , hypothyroid body scan neg (clinically tolerated hypothyroidism poorly) 06/09: tg undetectable (ab=1.5, normal) 12/10: tg undetectable (ab neg) 6/11: tg undectable (ab neg)  . HYPOTHYROIDISM, POSTSURGICAL 03/28/2007  . HYPERLIPIDEMIA 11/04/2006  . ALLERGIC RHINITIS 11/02/2006  . OSTEOPENIA 11/02/2006    Past Surgical History  Procedure Date  . Abdominal hysterectomy   . Cervical polypectomy     Family History  Problem Relation Age of Onset  . Diabetes Sister     History  Substance Use Topics  . Smoking status: Never Smoker   . Smokeless tobacco: Not on file  . Alcohol Use:  Not on file    OB History    Grav Para Term Preterm Abortions TAB SAB Ect Mult Living                  Review of Systems  Constitutional: Positive for chills.  Respiratory: Positive for cough.   Cardiovascular: Negative for chest pain.    Allergies  Review of patient's allergies indicates no known allergies.  Home Medications   Current Outpatient Rx  Name Route Sig Dispense Refill  . ACYCLOVIR 5 % EX CREA Topical Apply 1 application topically every 3 (three) hours. 15 g 11  . CALCIUM CARBONATE 600 MG PO TABS Oral Take 600 mg by mouth 2 (two) times daily with a meal.      . LEVOTHYROXINE SODIUM 75 MCG PO TABS Oral Take 1 tablet (75 mcg total) by mouth daily. 30 tablet 11  . THERA M PLUS PO TABS Oral Take 1 tablet by mouth daily.        BP 123/67  Pulse 90  Temp(Src) 101.9 F (38.8 C) (Oral)  Resp 16  SpO2 91%  Physical Exam  Constitutional: She is oriented to person, place, and time. She appears well-developed and well-nourished. She appears distressed.  HENT:  Head: Normocephalic and atraumatic.  Eyes: Conjunctivae are normal.  Neck: Neck supple.  Cardiovascular: Normal rate and regular rhythm.   Pulmonary/Chest: Effort normal and breath sounds normal.  Abdominal: Soft.  Musculoskeletal: Normal range of motion.  Neurological: She is alert and oriented to person, place, and time.  Skin: Skin is warm and  dry.  Psychiatric: She has a normal mood and affect.    ED Course  Procedures CXR and labs w/o acute findings. Will culture urine. Patient reports feeling much better after IV fluids and medication for nausea. Discussed patient with Dr. Hermina Staggers who has also seen and examined patient. We'll keep patient in CDU until morning for continued hydration and observation. Patient is agreeable with plan.  0600:  Patient has rested throughout the night in no acute distress. She reports cough has improved and there has been no further episodes of nausea and vomiting. Has been  tolerating by mouth fluids well. We'll plan for discharge home with medication for nausea and encourage close followup with her primary care physician this week. Patient and spouse agreeable with plan. Discussed patient with Dr. Dierdre Highman who is also agreeable with plan. Labs Reviewed  CBC - Abnormal; Notable for the following:    WBC 16.6 (*)    All other components within normal limits  DIFFERENTIAL - Abnormal; Notable for the following:    Neutrophils Relative 92 (*)    Neutro Abs 15.2 (*)    Lymphocytes Relative 3 (*)    Lymphs Abs 0.5 (*)    All other components within normal limits  BASIC METABOLIC PANEL - Abnormal; Notable for the following:    Sodium 133 (*)    Chloride 94 (*)    Glucose, Bld 162 (*)    Calcium 7.6 (*)    GFR calc non Af Amer 57 (*)    GFR calc Af Amer 66 (*)    All other components within normal limits  URINALYSIS, ROUTINE W REFLEX MICROSCOPIC - Abnormal; Notable for the following:    Color, Urine AMBER (*) BIOCHEMICALS MAY BE AFFECTED BY COLOR   APPearance CLOUDY (*)    Hgb urine dipstick SMALL (*)    Bilirubin Urine SMALL (*)    Ketones, ur 15 (*)    Protein, ur 100 (*)    Leukocytes, UA SMALL (*)    All other components within normal limits  URINE MICROSCOPIC-ADD ON - Abnormal; Notable for the following:    Squamous Epithelial / LPF FEW (*)    All other components within normal limits   Dg Chest 2 View  03/12/2011  *RADIOLOGY REPORT*  Clinical Data: Productive cough with chest pain, nausea and vomiting.  CHEST - 2 VIEW  Comparison: 08/14/2005.  Findings: The heart size and mediastinal contours are stable. There is probable chronic basilar lung disease with possible bronchiectasis.  No airspace disease, edema or pleural effusion is demonstrated.  There are surgical clips in the lower neck.  No acute osseous findings are identified.  IMPRESSION: No acute cardiopulmonary process.  Suspected chronic basilar lung disease.  Original Report Authenticated By: Gerrianne Scale, M.D.     No diagnosis found.    MDM  HPI, PE and clinical findings c/w viral syndrome.        Leanne Chang, NP 03/13/11 [phone]

## 2011-03-13 NOTE — ED Notes (Signed)
Patient and husband resting quietly   Patient states that is feeling better

## 2011-03-13 NOTE — ED Provider Notes (Signed)
Medical screening examination/treatment/procedure(s) were conducted as a shared visit with non-physician practitioner(s) and myself.  I personally evaluated the patient during the encounter  Patient seen by me currently in the CDU. Treat consistent with influenza or a severe upper respiratory infection. Patient states that she did have her flu shot this year. It started 2 days ago with a cough bodyaches and she's had a productive cough. Workup in the emergency department negative for any specific findings other than some leukocytosis. She is improved in the emergency department with IV fluids and feels better on 2 L of oxygen where her saturations are 96%.   Fields best for patient to stay in CDU overnight if by sunrise she is still feeling as good as she is now in her room air oxygen sats are normal she can be discharged home. If worse will require admission.   Shelda Jakes, MD 03/13/11 [phone]

## 2011-03-13 NOTE — ED Notes (Signed)
Patient states that she is feeling better.

## 2011-03-14 LAB — URINE CULTURE
Colony Count: 30000
Culture  Setup Time: 201212030825

## 2011-03-15 ENCOUNTER — Encounter: Payer: Self-pay | Admitting: Internal Medicine

## 2011-03-15 ENCOUNTER — Ambulatory Visit (INDEPENDENT_AMBULATORY_CARE_PROVIDER_SITE_OTHER): Payer: Medicare Other | Admitting: Internal Medicine

## 2011-03-15 VITALS — BP 120/70 | HR 88 | Temp 98.3°F | Wt 142.0 lb

## 2011-03-15 DIAGNOSIS — J069 Acute upper respiratory infection, unspecified: Secondary | ICD-10-CM

## 2011-03-15 MED ORDER — HYDROCODONE-HOMATROPINE 5-1.5 MG/5ML PO SYRP: 5.0000 mL | ORAL_SOLUTION | Freq: Four times a day (QID) | ORAL | Status: AC | PRN

## 2011-03-15 NOTE — Patient Instructions (Signed)
Get plenty of rest, Drink lots of  clear liquids, and use Tylenol or ibuprofen for fever and discomfort.    

## 2011-03-15 NOTE — Progress Notes (Signed)
  Subjective:    Patient ID: [name] [name], female    DOB: 1927-05-03, 77 y.o.   MRN: [id number]  HPI  77 year old patient who is seen today in followup. She is seen in the ED 3 days ago complaining of cough and congestion. A chest x-ray was negative at that time for pneumonia. She seems to be slowly improving but still having largely nonproductive cough. There's been no fever wheezing shortness of breath or chills. She has been using anti-tussive medications with some benefit    Review of Systems  Constitutional: Negative.   HENT: Positive for congestion, rhinorrhea, sneezing and sinus pressure. Negative for hearing loss, sore throat, dental problem and tinnitus.   Eyes: Negative for pain, discharge and visual disturbance.  Respiratory: Positive for cough. Negative for chest tightness, shortness of breath and wheezing.   Cardiovascular: Negative for chest pain, palpitations and leg swelling.  Gastrointestinal: Negative for nausea, vomiting, abdominal pain, diarrhea, constipation, blood in stool and abdominal distention.  Genitourinary: Negative for dysuria, urgency, frequency, hematuria, flank pain, vaginal bleeding, vaginal discharge, difficulty urinating, vaginal pain and pelvic pain.  Musculoskeletal: Negative for joint swelling, arthralgias and gait problem.  Skin: Negative for rash.  Neurological: Negative for dizziness, syncope, speech difficulty, weakness, numbness and headaches.  Hematological: Negative for adenopathy.  Psychiatric/Behavioral: Negative for behavioral problems, dysphoric mood and agitation. The patient is not nervous/anxious.        Objective:   Physical Exam  Constitutional: She is oriented to person, place, and time. She appears well-developed and well-nourished.  HENT:  Head: Normocephalic.  Right Ear: External ear normal.  Left Ear: External ear normal.  Mouth/Throat: Oropharynx is clear and moist.  Eyes: Conjunctivae and EOM are normal. Pupils are equal,  round, and reactive to light.  Neck: Normal range of motion. Neck supple. No thyromegaly present.  Cardiovascular: Normal rate, regular rhythm, normal heart sounds and intact distal pulses.   Pulmonary/Chest: Effort normal and breath sounds normal.       Breasts are diminished but clear. O2 saturation 94% pulse rate 86  Abdominal: Soft. Bowel sounds are normal. She exhibits no mass. There is no tenderness.  Musculoskeletal: Normal range of motion.  Lymphadenopathy:    She has no cervical adenopathy.  Neurological: She is alert and oriented to person, place, and time.  Skin: Skin is warm and dry. No rash noted.  Psychiatric: She has a normal mood and affect. Her behavior is normal.          Assessment & Plan:   Resolving URI. Will continue symptomatic treatment she seems to be improving daily. She'll will call there is any clinical worsening fever or chills

## 2011-03-27 ENCOUNTER — Encounter: Payer: Self-pay | Admitting: Family Medicine

## 2011-03-27 ENCOUNTER — Ambulatory Visit (INDEPENDENT_AMBULATORY_CARE_PROVIDER_SITE_OTHER): Payer: Medicare Other | Admitting: Family Medicine

## 2011-03-27 VITALS — BP 120/70 | HR 100 | Temp 97.8°F | Wt 136.0 lb

## 2011-03-27 DIAGNOSIS — R05 Cough: Secondary | ICD-10-CM

## 2011-03-27 DIAGNOSIS — J40 Bronchitis, not specified as acute or chronic: Secondary | ICD-10-CM

## 2011-03-27 MED ORDER — AZITHROMYCIN 250 MG PO TABS: 250.0000 mg | ORAL_TABLET | Freq: Every day | ORAL | Status: AC

## 2011-03-27 NOTE — Patient Instructions (Signed)

## 2011-03-27 NOTE — Progress Notes (Signed)
  Subjective:    Patient ID: Al Pimple, female    DOB: [birth year], 77 y.o.   MRN: [id number]  HPI 77 year old white female, nonsmoker, in with complaints of cough, congestion x2 weeks. She was seen in the emergency department on 03/12/2011 was given a cough medication, her symptoms never resolved. She now feels that she is worse, with increasing fatigue. Cough is nonproductive. Denies any lightheadedness or dizziness, chest pain, shortness of breath, nausea, vomiting, or edema.   Review of Systems As noted     Objective:   Physical Exam Constitutional: Alert and oriented in no acute distress ENT: Ears are clear, pharynx normal normal dentition, no sinus tenderness to palpation Neck: No lymphadenopathy Lungs: Clear to auscultation coarse breath sounds noted in the left base.  Cardiac: Regular rate and rhythm no murmurs rubs or gallops Skin: Warm and dry, no cyanosis  Psychiatric: Normal mood and affect       Assessment & Plan:   Assessment: Acute bronchitis, cough  Plan: Z-Pak as directed. Mucinex over-the-counter when necessary. Also mannitol and on ibuprofen when necessary. Call if symptoms worsen or persist. Recheck as scheduled and sooner when necessary.

## 2011-03-29 ENCOUNTER — Telehealth: Payer: Self-pay | Admitting: Internal Medicine

## 2011-03-29 MED ORDER — GUAIFENESIN-CODEINE 100-10 MG/5ML PO SYRP: 5.0000 mL | ORAL_SOLUTION | Freq: Three times a day (TID) | ORAL | Status: AC | PRN

## 2011-03-29 NOTE — Telephone Encounter (Signed)
Rx for cough faxed to pharmacy and pt aware

## 2011-03-29 NOTE — Telephone Encounter (Signed)
Here to see [facility] Monday. Only has 2 pills of abx left, but is not better. Wants to know if more or something else can be called in to [facility] on [address].

## 2011-09-13 ENCOUNTER — Encounter: Payer: Self-pay | Admitting: Endocrinology

## 2011-09-13 ENCOUNTER — Ambulatory Visit (INDEPENDENT_AMBULATORY_CARE_PROVIDER_SITE_OTHER): Payer: Medicare Other | Admitting: Endocrinology

## 2011-09-13 ENCOUNTER — Other Ambulatory Visit (INDEPENDENT_AMBULATORY_CARE_PROVIDER_SITE_OTHER): Payer: Medicare Other

## 2011-09-13 VITALS — BP 142/74 | HR 77 | Temp 97.0°F | Ht 64.0 in | Wt 137.0 lb

## 2011-09-13 DIAGNOSIS — E209 Hypoparathyroidism, unspecified: Secondary | ICD-10-CM

## 2011-09-13 DIAGNOSIS — C73 Malignant neoplasm of thyroid gland: Secondary | ICD-10-CM

## 2011-09-13 DIAGNOSIS — E890 Postprocedural hypothyroidism: Secondary | ICD-10-CM

## 2011-09-13 LAB — TSH: TSH: 3.36 u[IU]/mL (ref 0.35–5.50)

## 2011-09-13 NOTE — Progress Notes (Signed)
Subjective:    Patient ID: [name] [name], female    DOB: October 12, 1927, 78 y.o.   MRN: [id number]  HPI The state of at least three ongoing medical problems is addressed today: Stage-2 follicular adenocarcinoma of the thyroid.  She does not notice any nodule at the neck. 5/07:  right lobectomy--path shows 3 cm follicular ca 8/07:  completion left lobectomy, no more tumor seen 9/07  thyroglobulin 11.8 (ab neg) 11/07: i-131 rx, 101 [facility] 6/08 thyroglogulin 0.2 (ab neg) 6/08: hypothyroid body scan neg (clinically tolerated hypothyroidism poorly) 6/09 tg undetectable (ab=1.5, normal) 12/10: tg undetectable (ab neg) 6/11:  tg undetectable (ab neg) 6/12: tg undetectable (ab neg) Postsurgical hypothyroidism:  Denies weight change. Postsurgical hypoparathyroidism:  Denies cramps.  She does not take rocaltrol. Past Medical History  Diagnosis Date  . Malignant neoplasm of thyroid gland 03/28/2007    5/07: righte lobectomy--path shows 3 cm follicular ca 8/07: completion left lobectomy, no more tumor seen 09/07: thyroglobulin 0.2 9ab neg) 11/07: i-131 rx, 101 [facility] 6/08: thyroglogulin 0.2 (ab neg) , hypothyroid body scan neg (clinically tolerated hypothyroidism poorly) 06/09: tg undetectable (ab=1.5, normal) 12/10: tg undetectable (ab neg) 6/11: tg undectable (ab neg)  . HYPOTHYROIDISM, POSTSURGICAL 03/28/2007  . HYPERLIPIDEMIA 11/04/2006  . ALLERGIC RHINITIS 11/02/2006  . OSTEOPENIA 11/02/2006    Past Surgical History  Procedure Date  . Abdominal hysterectomy   . Cervical polypectomy     History   Social History  . Marital Status: Married    Spouse Name: N/A    Number of Children: N/A  . Years of Education: N/A   Occupational History  . Not on file.   Social History Main Topics  . Smoking status: Never Smoker   . Smokeless tobacco: Not on file  . Alcohol Use: Not on file  . Drug Use: Not on file  . Sexually Active: Not on file   Other Topics Concern  . Not on file   Social History  Narrative   Doesn't drive    Current Outpatient Prescriptions on File Prior to Visit  Medication Sig Dispense Refill  . acyclovir (ZOVIRAX) 5 % cream Apply 1 application topically every 3 (three) hours.  15 g  11  . calcium carbonate (OS-CAL) 600 MG TABS Take 600 mg by mouth 2 (two) times daily with a meal.        . levothyroxine (SYNTHROID, LEVOTHROID) 75 MCG tablet Take 1 tablet (75 mcg total) by mouth daily.  30 tablet  11  . Multiple Vitamins-Minerals (MULTIVITAMINS THER. W/MINERALS) TABS Take 1 tablet by mouth daily.          No Known Allergies  Family History  Problem Relation Age of Onset  . Diabetes Sister     There were no vitals taken for this visit.  Review of Systems Denies neck pain and dysphagia.    Objective:   Physical Exam VITAL SIGNS:  See vs page GENERAL: no distress Neck: a healed scar is present.  i do not appreciate a nodule in the thyroid or elsewhere in the neck  Lab Results  Component Value Date   TSH 3.36 09/13/2011   Lab Results  Component Value Date   PTH 22.1 09/13/2011   CALCIUM 8.3* 09/13/2011   TG is undetectable    Assessment & Plan:  Stage-2 papillary adenocarcinoma of the thyroid.  No evidence of recurrence. Postsurgical hypothyroidism.  In view of her long disease-free interval, she can take a replacement (rather than suppressive) dosage of synthroid now.  Postsurgical hypocalcemia, resolved.  She doesn't need rocaltrol

## 2011-09-13 NOTE — Patient Instructions (Addendum)
blood tests are being requested for you today.  You will receive a letter with results. Please return in 1 year. Please make an appointment for a regular physical with dr swords.

## 2011-09-14 LAB — THYROGLOBULIN LEVEL: Thyroglobulin: <0.2 ng/mL (ref 0.0–55.0)

## 2011-09-14 LAB — PTH, INTACT AND CALCIUM: PTH: 22.1 pg/mL (ref 14.0–72.0)

## 2011-09-15 ENCOUNTER — Encounter: Payer: Self-pay | Admitting: Endocrinology

## 2011-09-18 ENCOUNTER — Telehealth: Payer: Self-pay | Admitting: *Deleted

## 2011-09-18 MED ORDER — LEVOTHYROXINE SODIUM 75 MCG PO TABS: 75.0000 ug | ORAL_TABLET | Freq: Every day | ORAL | Status: DC

## 2011-09-18 NOTE — Telephone Encounter (Signed)
Called pt to inform of thyroid results, pt informed (letter also mailed to pt).

## 2011-11-10 ENCOUNTER — Ambulatory Visit (INDEPENDENT_AMBULATORY_CARE_PROVIDER_SITE_OTHER): Payer: Medicare Other | Admitting: Internal Medicine

## 2011-11-10 ENCOUNTER — Encounter: Payer: Self-pay | Admitting: Internal Medicine

## 2011-11-10 VITALS — BP 140/80 | Temp 97.8°F | Wt 143.0 lb

## 2011-11-10 DIAGNOSIS — M79605 Pain in left leg: Secondary | ICD-10-CM

## 2011-11-10 DIAGNOSIS — M79609 Pain in unspecified limb: Secondary | ICD-10-CM

## 2011-11-10 DIAGNOSIS — E890 Postprocedural hypothyroidism: Secondary | ICD-10-CM

## 2011-11-10 NOTE — Patient Instructions (Signed)
You  may move around, but avoid painful motions and activities.  Apply ice to the sore area for 15 to 20 minutes 3 or 4 times daily for the next two to 3 days.  celebrex  2 daily  Call or return to clinic prn if these symptoms worsen or fail to improve as anticipated.

## 2011-11-10 NOTE — Progress Notes (Signed)
  Subjective:    Patient ID: [name] [name], female    DOB: 31-May-1927, 78 y.o.   MRN: [id number]  HPI  78 year old patient who presents with a week and a half history of left lower leg pain. She, ties her anterior lower leg about a week and a half ago. Pain was fairly minor and did not really effect her usual daily activities. For the past several days she has had some pain mainly in the left popliteal area; there's been no swelling or calf pain  Past Medical History  Diagnosis Date  . Malignant neoplasm of thyroid gland 03/28/2007    5/07: righte lobectomy--path shows 3 cm follicular ca 8/07: completion left lobectomy, no more tumor seen 09/07: thyroglobulin 0.2 9ab neg) 11/07: i-131 rx, [facility] 6/08: thyroglogulin 0.2 (ab neg) , hypothyroid body scan neg (clinically tolerated hypothyroidism poorly) 06/09: tg undetectable (ab=1.5, normal) 12/10: tg undetectable (ab neg) 6/11: tg undectable (ab neg)  . HYPOTHYROIDISM, POSTSURGICAL 03/28/2007  . HYPERLIPIDEMIA 11/04/2006  . ALLERGIC RHINITIS 11/02/2006  . OSTEOPENIA 11/02/2006    History   Social History  . Marital Status: Married    Spouse Name: N/A    Number of Children: N/A  . Years of Education: N/A   Occupational History  . Not on file.   Social History Main Topics  . Smoking status: Never Smoker   . Smokeless tobacco: Not on file  . Alcohol Use: Not on file  . Drug Use: Not on file  . Sexually Active: Not on file   Other Topics Concern  . Not on file   Social History Narrative   Doesn't drive    Past Surgical History  Procedure Date  . Abdominal hysterectomy   . Cervical polypectomy     Family History  Problem Relation Age of Onset  . Diabetes Sister     No Known Allergies  Current Outpatient Prescriptions on File Prior to Visit  Medication Sig Dispense Refill  . acyclovir (ZOVIRAX) 5 % cream Apply 1 application topically every 3 (three) hours.  15 g  11  . calcium carbonate (OS-CAL) 600 MG TABS Take 600 mg  by mouth 2 (two) times daily with a meal.        . levothyroxine (SYNTHROID, LEVOTHROID) 75 MCG tablet Take 1 tablet (75 mcg total) by mouth daily.  30 tablet  11  . Multiple Vitamins-Minerals (MULTIVITAMINS THER. W/MINERALS) TABS Take 1 tablet by mouth daily.          BP 140/80  Temp 97.8 F (36.6 C) (Oral)  Wt 143 lb (64.864 kg)    Review of Systems  Musculoskeletal:       Left leg pain       Objective:   Physical Exam  Constitutional: She appears well-developed and well-nourished. No distress.  Musculoskeletal:       Extremities the left leg revealed no abnormalities. There is no local tenderness or swelling. No popliteal tenderness          Assessment & Plan:   L Leg pain-  Will check d dimer and treat with celebrex

## 2011-11-13 ENCOUNTER — Other Ambulatory Visit: Payer: Self-pay | Admitting: Internal Medicine

## 2011-11-13 ENCOUNTER — Encounter (INDEPENDENT_AMBULATORY_CARE_PROVIDER_SITE_OTHER): Payer: Medicare Other

## 2011-11-13 ENCOUNTER — Telehealth: Payer: Self-pay | Admitting: Internal Medicine

## 2011-11-13 DIAGNOSIS — M79606 Pain in leg, unspecified: Secondary | ICD-10-CM

## 2011-11-13 DIAGNOSIS — R7989 Other specified abnormal findings of blood chemistry: Secondary | ICD-10-CM

## 2011-11-13 DIAGNOSIS — M7989 Other specified soft tissue disorders: Secondary | ICD-10-CM

## 2011-11-13 DIAGNOSIS — R229 Localized swelling, mass and lump, unspecified: Secondary | ICD-10-CM

## 2011-11-13 DIAGNOSIS — M79609 Pain in unspecified limb: Secondary | ICD-10-CM

## 2011-11-13 NOTE — Telephone Encounter (Signed)
Pt husband called in regard to pt, husband seemed a little confused. Pt went today to a venus doppler (referral by Dr. Kirtland Bouchard)  and husband  called to see if pt needed to continue taking her normal medication. Please contact

## 2011-11-13 NOTE — Telephone Encounter (Signed)
Spoke with husband - informed results neg for clot- ok to take celebrex bid until samples run out - call back if sx dont improve. KIK

## 2011-11-15 ENCOUNTER — Other Ambulatory Visit: Payer: Self-pay | Admitting: Internal Medicine

## 2011-11-15 MED ORDER — CITALOPRAM HYDROBROMIDE 20 MG PO TABS: 20.0000 mg | ORAL_TABLET | Freq: Every day | ORAL | Status: DC

## 2011-11-15 NOTE — Telephone Encounter (Signed)
#  60 one or two daily

## 2011-11-15 NOTE — Telephone Encounter (Signed)
Please advise - this is a dr. Cato Mulligan pt

## 2011-11-15 NOTE — Telephone Encounter (Signed)
Pt was given samples of celebrex 200mg  by Dr Kirtland Bouchard. Please call new rx into [facility] [phone].

## 2011-11-15 NOTE — Telephone Encounter (Signed)
done

## 2012-01-05 ENCOUNTER — Ambulatory Visit (INDEPENDENT_AMBULATORY_CARE_PROVIDER_SITE_OTHER): Payer: Medicare Other

## 2012-01-05 DIAGNOSIS — Z23 Encounter for immunization: Secondary | ICD-10-CM

## 2012-01-24 ENCOUNTER — Other Ambulatory Visit: Payer: Self-pay | Admitting: Obstetrics and Gynecology

## 2012-01-24 DIAGNOSIS — Z1231 Encounter for screening mammogram for malignant neoplasm of breast: Secondary | ICD-10-CM

## 2012-02-15 ENCOUNTER — Other Ambulatory Visit: Payer: Self-pay | Admitting: Obstetrics and Gynecology

## 2012-02-15 DIAGNOSIS — Z780 Asymptomatic menopausal state: Secondary | ICD-10-CM

## 2012-02-15 DIAGNOSIS — M8580 Other specified disorders of bone density and structure, unspecified site: Secondary | ICD-10-CM

## 2012-02-19 ENCOUNTER — Ambulatory Visit
Admission: RE | Admit: 2012-02-19 | Discharge: 2012-02-19 | Disposition: A | Discharge status: Home / Self Care | Payer: Medicare Other | Source: Ambulatory Visit | Attending: Obstetrics and Gynecology | Admitting: Obstetrics and Gynecology

## 2012-02-19 DIAGNOSIS — M8580 Other specified disorders of bone density and structure, unspecified site: Secondary | ICD-10-CM

## 2012-02-19 DIAGNOSIS — Z780 Asymptomatic menopausal state: Secondary | ICD-10-CM

## 2012-02-26 ENCOUNTER — Ambulatory Visit: Payer: Medicare Other

## 2012-02-26 ENCOUNTER — Ambulatory Visit
Admission: RE | Admit: 2012-02-26 | Discharge: 2012-02-26 | Disposition: A | Discharge status: Home / Self Care | Payer: Medicare Other | Source: Ambulatory Visit | Attending: Obstetrics and Gynecology | Admitting: Obstetrics and Gynecology

## 2012-02-26 DIAGNOSIS — Z1231 Encounter for screening mammogram for malignant neoplasm of breast: Secondary | ICD-10-CM

## 2012-07-05 ENCOUNTER — Other Ambulatory Visit: Payer: Self-pay | Admitting: *Deleted

## 2012-07-05 MED ORDER — LEVOTHYROXINE SODIUM 75 MCG PO TABS: 75.0000 ug | ORAL_TABLET | Freq: Every day | ORAL | Status: DC

## 2012-07-30 ENCOUNTER — Telehealth: Payer: Self-pay | Admitting: Internal Medicine

## 2012-07-30 NOTE — Telephone Encounter (Signed)
Pt will come in to see Dr Selena Batten on Thurs. Advised pt she would need to come in the office in order to get script.

## 2012-07-30 NOTE — Telephone Encounter (Signed)
She has not seen Dr Cato Mulligan since 2011.  Can you see if she wants to transfer to another physician or if not then she needs to see Dr Cato Mulligan

## 2012-07-30 NOTE — Telephone Encounter (Signed)
Pt needs refill of acyclovir (ZOVIRAX) 5 % cream [facility]/ [address]

## 2012-08-01 ENCOUNTER — Encounter: Payer: Self-pay | Admitting: Family Medicine

## 2012-08-01 ENCOUNTER — Ambulatory Visit (INDEPENDENT_AMBULATORY_CARE_PROVIDER_SITE_OTHER): Payer: Medicare Other | Admitting: Family Medicine

## 2012-08-01 VITALS — BP 140/82 | HR 87 | Temp 98.0°F | Wt 151.0 lb

## 2012-08-01 DIAGNOSIS — B09 Unspecified viral infection characterized by skin and mucous membrane lesions: Secondary | ICD-10-CM

## 2012-08-01 MED ORDER — ACYCLOVIR 5 % EX CREA: 1.0000 "application " | TOPICAL_CREAM | CUTANEOUS | Status: DC

## 2012-08-01 NOTE — Progress Notes (Signed)
Chief Complaint  Patient presents with  . follow up on meds    HPI:  Here because advised needed appt for refill of medication: -reports needs refill on zovirax 5% cream -reports uses this 1-2 times per year when has mild flare of shingles on her buttock - has rx package prescribed by her PCP in 2013 - no outbreak currently -sees gyn for her yearly physicals and sees Dr. Everardo All for her thyroid - has follow up in 1 month  -otherwise no concerns or complaints today  ROS: See pertinent positives and negatives per HPI.  Past Medical History  Diagnosis Date  . Malignant neoplasm of thyroid gland 03/28/2007    5/07: righte lobectomy--path shows 3 cm follicular ca 8/07: completion left lobectomy, no more tumor seen 09/07: thyroglobulin 0.2 9ab neg) 11/07: i-131 rx, 101 [facility] 6/08: thyroglogulin 0.2 (ab neg) , hypothyroid body scan neg (clinically tolerated hypothyroidism poorly) 06/09: tg undetectable (ab=1.5, normal) 12/10: tg undetectable (ab neg) 6/11: tg undectable (ab neg)  . HYPOTHYROIDISM, POSTSURGICAL 03/28/2007  . HYPERLIPIDEMIA 11/04/2006  . ALLERGIC RHINITIS 11/02/2006  . OSTEOPENIA 11/02/2006    Family History  Problem Relation Age of Onset  . Diabetes Sister     History   Social History  . Marital Status: Married    Spouse Name: N/A    Number of Children: N/A  . Years of Education: N/A   Social History Main Topics  . Smoking status: Never Smoker   . Smokeless tobacco: None  . Alcohol Use: None  . Drug Use: None  . Sexually Active: None   Other Topics Concern  . None   Social History Narrative   Doesn't drive    Current outpatient prescriptions:acyclovir cream (ZOVIRAX) 5 %, Apply 1 application topically every 3 (three) hours., Disp: 5 g, Rfl: 2;  calcium carbonate (OS-CAL) 600 MG TABS, Take 600 mg by mouth 2 (two) times daily with a meal.  , Disp: , Rfl: ;  levothyroxine (SYNTHROID, LEVOTHROID) 75 MCG tablet, Take 1 tablet (75 mcg total) by mouth daily., Disp: 30  tablet, Rfl: 10 Multiple Vitamins-Minerals (MULTIVITAMINS THER. W/MINERALS) TABS, Take 1 tablet by mouth daily.  , Disp: , Rfl: ;  citalopram (CELEXA) 20 MG tablet, Take 1 tablet (20 mg total) by mouth daily., Disp: 60 tablet, Rfl: 1  EXAM:  Filed Vitals:   08/01/12 1256  BP: 140/82  Pulse: 87  Temp: 98 F (36.7 C)    Body mass index is 25.91 kg/(m^2).  GENERAL: vitals reviewed and listed above, alert, oriented, appears well hydrated and in no acute distress  HEENT: atraumatic, conjunttiva clear, no obvious abnormalities on inspection of external nose and ears  NECK: no obvious masses on inspection  SKIN: no abnormal lesions  MS: moves all extremities without noticeable abnormality  PSYCH: pleasant and cooperative, no obvious depression or anxiety  ASSESSMENT AND PLAN:  Discussed the following assessment and plan:  Viral rash - Plan: acyclovir cream (ZOVIRAX) 5 %  -refilled med per her request and prior PCP refill -has follow up with endo for her thyroid -Patient advised to return or notify a doctor immediately if symptoms worsen or persist or new concerns arise.  There are no Patient Instructions on file for this visit.   Kriste Basque R.

## 2012-09-11 ENCOUNTER — Ambulatory Visit (INDEPENDENT_AMBULATORY_CARE_PROVIDER_SITE_OTHER): Payer: Medicare Other | Admitting: Endocrinology

## 2012-09-11 ENCOUNTER — Encounter: Payer: Self-pay | Admitting: Endocrinology

## 2012-09-11 VITALS — BP 122/70 | HR 76 | Ht 64.0 in | Wt 150.0 lb

## 2012-09-11 DIAGNOSIS — C73 Malignant neoplasm of thyroid gland: Secondary | ICD-10-CM

## 2012-09-11 DIAGNOSIS — E209 Hypoparathyroidism, unspecified: Secondary | ICD-10-CM

## 2012-09-11 DIAGNOSIS — E890 Postprocedural hypothyroidism: Secondary | ICD-10-CM

## 2012-09-11 NOTE — Progress Notes (Signed)
Subjective:    Patient ID: [name] [name], female    DOB: January 10, 1928, 79 y.o.   MRN: [id number]  HPI The state of at least three ongoing medical problems is addressed today, with interval history of each noted here: Stage-2 follicular adenocarcinoma of the thyroid.  She does not notice any nodule at the neck. 5/07:  right lobectomy--path shows 3 cm right lobe follicular ca (T2 N0 M0). 8/07:  completion left lobectomy, no more tumor seen 9/07  thyroglobulin 11.8 (ab neg) 11/07: i-131 rx, [facility] 6/08 thyroglogulin 0.2 (ab neg) 6/08: hypothyroid body scan neg (clinically tolerated hypothyroidism poorly) 6/09 tg undetectable (ab=1.5, normal) 12/10: tg undetectable (ab neg) 6/11:  tg undetectable (ab neg) 6/12: tg undetectable (ab neg) 6/13: tg undetectable (ab neg) Postsurgical hypothyroidism:  Denies weight change. Postsurgical hypoparathyroidism:  Denies cramps.  She has not needed rx for this.  Past Medical History  Diagnosis Date  . Malignant neoplasm of thyroid gland 03/28/2007    5/07: righte lobectomy--path shows 3 cm follicular ca 8/07: completion left lobectomy, no more tumor seen 09/07: thyroglobulin 0.2 9ab neg) 11/07: i-131 rx, [facility] 6/08: thyroglogulin 0.2 (ab neg) , hypothyroid body scan neg (clinically tolerated hypothyroidism poorly) 06/09: tg undetectable (ab=1.5, normal) 12/10: tg undetectable (ab neg) 6/11: tg undectable (ab neg)  . HYPOTHYROIDISM, POSTSURGICAL 03/28/2007  . HYPERLIPIDEMIA 11/04/2006  . ALLERGIC RHINITIS 11/02/2006  . OSTEOPENIA 11/02/2006    Past Surgical History  Procedure Laterality Date  . Abdominal hysterectomy    . Cervical polypectomy      History   Social History  . Marital Status: Married    Spouse Name: N/A    Number of Children: N/A  . Years of Education: N/A   Occupational History  . Not on file.   Social History Main Topics  . Smoking status: Never Smoker   . Smokeless tobacco: Not on file  . Alcohol Use: Not on file  .  Drug Use: Not on file  . Sexually Active: Not on file   Other Topics Concern  . Not on file   Social History Narrative   Doesn't drive    Current Outpatient Prescriptions on File Prior to Visit  Medication Sig Dispense Refill  . acyclovir cream (ZOVIRAX) 5 % Apply 1 application topically every 3 (three) hours.  5 g  2  . calcium carbonate (OS-CAL) 600 MG TABS Take 600 mg by mouth 2 (two) times daily with a meal.        . citalopram (CELEXA) 20 MG tablet Take 1 tablet (20 mg total) by mouth daily.  60 tablet  1  . levothyroxine (SYNTHROID, LEVOTHROID) 75 MCG tablet Take 1 tablet (75 mcg total) by mouth daily.  30 tablet  10  . Multiple Vitamins-Minerals (MULTIVITAMINS THER. W/MINERALS) TABS Take 1 tablet by mouth daily.         No current facility-administered medications on file prior to visit.    No Known Allergies  Family History  Problem Relation Age of Onset  . Diabetes Sister     BP 122/70  Pulse 76  Ht 5\' 4"  (1.626 m)  Wt 150 lb (68.04 kg)  BMI 25.73 kg/m2  SpO2 98%    Review of Systems Denies neck pain and numbness.    Objective:   Physical Exam VITAL SIGNS:  See vs page GENERAL: no distress Neck: a healed scar is present.  i do not appreciate a nodule in the thyroid or elsewhere in the neck  Lab  Results  Component Value Date   TSH 1.51 09/11/2012      Assessment & Plan:  Thyroid cancer: no evidence of recurrence Postsurgical hypothyroidism: We discussed rx options.  She no longer needs a suppressed TSH, in view of her long disease-free interval Postsurgical hypoparathyroidism: We discussed rx options: she can stay off rocaltrol unless Ca++ goes below 8.

## 2012-09-11 NOTE — Patient Instructions (Addendum)
blood tests are being requested for you today.  We'll contact you with results.   Please return in 1 year.   

## 2012-09-12 LAB — PTH, INTACT AND CALCIUM: Calcium: 8.8 mg/dL (ref 8.4–10.5)

## 2012-09-12 LAB — THYROGLOBULIN LEVEL: Thyroglobulin: <0.2 ng/mL (ref 0.0–55.0)

## 2012-09-12 LAB — THYROGLOBULIN ANTIBODY: Thyroglobulin Ab: <20 U/mL (ref <40.0)

## 2013-03-31 ENCOUNTER — Other Ambulatory Visit: Payer: Self-pay | Admitting: *Deleted

## 2013-03-31 MED ORDER — LEVOTHYROXINE SODIUM 75 MCG PO TABS: 75.0000 ug | ORAL_TABLET | Freq: Every day | ORAL | Status: DC

## 2013-04-25 ENCOUNTER — Other Ambulatory Visit: Payer: Self-pay | Admitting: Obstetrics and Gynecology

## 2013-04-29 ENCOUNTER — Other Ambulatory Visit: Payer: Self-pay | Admitting: Obstetrics and Gynecology

## 2013-04-29 DIAGNOSIS — R928 Other abnormal and inconclusive findings on diagnostic imaging of breast: Secondary | ICD-10-CM

## 2013-05-12 ENCOUNTER — Ambulatory Visit
Admission: RE | Admit: 2013-05-12 | Discharge: 2013-05-12 | Disposition: A | Discharge status: Home / Self Care | Payer: Medicare Other | Source: Ambulatory Visit | Attending: Obstetrics and Gynecology | Admitting: Obstetrics and Gynecology

## 2013-05-12 DIAGNOSIS — R928 Other abnormal and inconclusive findings on diagnostic imaging of breast: Secondary | ICD-10-CM

## 2013-08-01 ENCOUNTER — Telehealth: Payer: Self-pay | Admitting: Internal Medicine

## 2013-08-01 NOTE — Telephone Encounter (Signed)
Ok per Dr Swords 

## 2013-08-01 NOTE — Telephone Encounter (Signed)
Pt would like to switch to dr Burnice Logan due to dr swords unavailable

## 2013-08-05 NOTE — Telephone Encounter (Signed)
ok 

## 2013-08-05 NOTE — Telephone Encounter (Signed)
lmom for pt to cb

## 2013-08-07 NOTE — Telephone Encounter (Signed)
Pt is aware new pcp

## 2013-09-04 ENCOUNTER — Telehealth: Payer: Self-pay | Admitting: Endocrinology

## 2013-09-04 NOTE — Telephone Encounter (Signed)
Pt needs Levothyroxine called in she has no more refills and only 2 pills left until her appt 6/9

## 2013-09-05 MED ORDER — LEVOTHYROXINE SODIUM 75 MCG PO TABS: 75.0000 ug | ORAL_TABLET | Freq: Every day | ORAL | Status: DC

## 2013-09-05 NOTE — Telephone Encounter (Signed)
Rx refilled.

## 2013-09-16 ENCOUNTER — Ambulatory Visit (INDEPENDENT_AMBULATORY_CARE_PROVIDER_SITE_OTHER): Payer: Medicare Other | Admitting: Endocrinology

## 2013-09-16 ENCOUNTER — Encounter: Payer: Self-pay | Admitting: Endocrinology

## 2013-09-16 VITALS — BP 126/80 | HR 91 | Temp 98.0°F | Ht 64.0 in | Wt 154.0 lb

## 2013-09-16 DIAGNOSIS — E890 Postprocedural hypothyroidism: Secondary | ICD-10-CM

## 2013-09-16 DIAGNOSIS — E209 Hypoparathyroidism, unspecified: Secondary | ICD-10-CM

## 2013-09-16 DIAGNOSIS — C73 Malignant neoplasm of thyroid gland: Secondary | ICD-10-CM

## 2013-09-16 LAB — TSH: TSH: 5.22 u[IU]/mL — ABNORMAL HIGH (ref 0.35–4.50)

## 2013-09-16 MED ORDER — LEVOTHYROXINE SODIUM 100 MCG PO TABS: 100.0000 ug | ORAL_TABLET | Freq: Every day | ORAL | Status: DC

## 2013-09-16 NOTE — Patient Instructions (Signed)
blood tests are being requested for you today.  We'll contact you with results. Let's also check an ultrasound.  you will receive a phone call, about a day and time for an appointment Please return in 1 year.  

## 2013-09-16 NOTE — Progress Notes (Signed)
Subjective:    Patient ID: [name] [name], female    DOB: May 21, 1927, 80 y.o.   MRN: [id number]  HPI Stage-2 follicular adenocarcinoma of the thyroid.  Pt states few weeks of slight swelling at the right anterior neck, but no assoc pain.  5/07:  right lobectomy--path shows 3 cm right lobe follicular ca (T2 N0 M0). 8/07:  completion left lobectomy, no more tumor seen 9/07  thyroglobulin 11.8 (ab neg) 11/07: i-131 rx, [facility] 6/08 thyroglogulin 0.2 (ab neg) 6/08: hypothyroid body scan neg (clinically tolerated hypothyroidism poorly) 6/09 tg undetectable (ab=1.5, normal) 12/10: tg undetectable (ab neg) 6/11:  tg undetectable (ab neg) 6/12: tg undetectable (ab neg) 6/13: tg undetectable (ab neg) Postsurgical hypothyroidism:  Denies weight change.   Postsurgical hypoparathyroidism:  Denies cramps.  She has not needed rx for this.  Past Medical History  Diagnosis Date  . Malignant neoplasm of thyroid gland 03/28/2007    5/07: righte lobectomy--path shows 3 cm follicular ca 6/62: completion left lobectomy, no more tumor seen 09/07: thyroglobulin 0.2 9ab neg) 11/07: i-131 rx, 101 [facility] 6/08: thyroglogulin 0.2 (ab neg) , hypothyroid body scan neg (clinically tolerated hypothyroidism poorly) 06/09: tg undetectable (ab=1.5, normal) 12/10: tg undetectable (ab neg) 6/11: tg undectable (ab neg)  . HYPOTHYROIDISM, POSTSURGICAL 03/28/2007  . HYPERLIPIDEMIA 11/04/2006  . ALLERGIC RHINITIS 11/02/2006  . OSTEOPENIA 11/02/2006    Past Surgical History  Procedure Laterality Date  . Abdominal hysterectomy    . Cervical polypectomy      History   Social History  . Marital Status: Married    Spouse Name: N/A    Number of Children: N/A  . Years of Education: N/A   Occupational History  . Not on file.   Social History Main Topics  . Smoking status: Never Smoker   . Smokeless tobacco: Not on file  . Alcohol Use: Not on file  . Drug Use: Not on file  . Sexual Activity: Not on file   Other  Topics Concern  . Not on file   Social History Narrative   Doesn't drive    Current Outpatient Prescriptions on File Prior to Visit  Medication Sig Dispense Refill  . acyclovir cream (ZOVIRAX) 5 % Apply 1 application topically every 3 (three) hours.  5 g  2  . calcium carbonate (OS-CAL) 600 MG TABS Take 600 mg by mouth 2 (two) times daily with a meal.        . Multiple Vitamins-Minerals (MULTIVITAMINS THER. W/MINERALS) TABS Take 1 tablet by mouth daily.        . citalopram (CELEXA) 20 MG tablet Take 1 tablet (20 mg total) by mouth daily.  60 tablet  1   No current facility-administered medications on file prior to visit.    No Known Allergies  Family History  Problem Relation Age of Onset  . Diabetes Sister     BP 126/80  Pulse 91  Temp(Src) 98 F (36.7 C) (Oral)  Ht 5\' 4"  (1.626 m)  Wt 154 lb (69.854 kg)  BMI 26.42 kg/m2  SpO2 91%    Review of Systems Denies tremor and dry skin.      Objective:   Physical Exam VITAL SIGNS:  See vs page GENERAL: no distress Neck: a healed scar is present.  i do not appreciate a nodule in the thyroid or elsewhere in the neck.    Lab Results  Component Value Date   TSH 5.22* 09/16/2013      Assessment & Plan:  Neck swelling, new, uncertain etiology.   Postsurgical hypothyroidism: mild exacerbation. Thyroid cancer: low risk of recurrence.   Hypoparathyroidism: has been improved in the past 1-2 years, but she should have recheck.    Patient is advised the following: Patient Instructions  blood tests are being requested for you today.  We'll contact you with results. Let's also check an ultrasound.  you will receive a phone call, about a day and time for an appointment Please return in 1 year.

## 2013-09-17 LAB — THYROGLOBULIN ANTIBODY: Thyroglobulin Ab: <20 IU/mL (ref <40.0)

## 2013-09-17 LAB — THYROGLOBULIN LEVEL

## 2013-09-18 ENCOUNTER — Ambulatory Visit
Admission: RE | Admit: 2013-09-18 | Discharge: 2013-09-18 | Disposition: A | Discharge status: Home / Self Care | Payer: Medicare Other | Source: Ambulatory Visit | Attending: Endocrinology | Admitting: Endocrinology

## 2013-09-18 DIAGNOSIS — C73 Malignant neoplasm of thyroid gland: Secondary | ICD-10-CM

## 2013-09-18 LAB — PTH, INTACT AND CALCIUM
Calcium: 7.9 mg/dL — ABNORMAL LOW (ref 8.4–10.5)
PTH: 22.4 pg/mL (ref 14.0–72.0)

## 2013-11-06 ENCOUNTER — Telehealth: Payer: Self-pay | Admitting: Endocrinology

## 2013-11-06 NOTE — Telephone Encounter (Signed)
Patient stated that ever since she started taking levothyroxine 100 mg she has been feeling really dizzy. Please advise

## 2013-11-07 NOTE — Telephone Encounter (Signed)
Called pt. She states that she has been having off and on dizzy spells for a couple of days and and has attributed it to her thyroid medication. Advised pt that Dr. Loanne Drilling is out of the office and he will be back on 11/17/2013. Suggested to pt that message could be forwarded to covering MD and she states that she would rather wait and until Dr. Cordelia Pen comes back to address. Pt instructed pt to contact office if dizzy spells continue or become worse. Pt voice understanding.

## 2014-01-14 ENCOUNTER — Emergency Department (HOSPITAL_COMMUNITY): Payer: Medicare Other | Admitting: Anesthesiology

## 2014-01-14 ENCOUNTER — Ambulatory Visit (HOSPITAL_COMMUNITY)
Admission: EM | Admit: 2014-01-14 | Discharge: 2014-01-15 | Disposition: A | Discharge status: Home / Self Care | Payer: Medicare Other | Attending: Orthopedic Surgery | Admitting: Orthopedic Surgery

## 2014-01-14 ENCOUNTER — Emergency Department (HOSPITAL_COMMUNITY): Payer: Medicare Other

## 2014-01-14 ENCOUNTER — Encounter (HOSPITAL_COMMUNITY): Payer: Medicare Other | Admitting: Anesthesiology

## 2014-01-14 ENCOUNTER — Encounter (HOSPITAL_COMMUNITY): Payer: Self-pay | Admitting: Emergency Medicine

## 2014-01-14 ENCOUNTER — Encounter (HOSPITAL_COMMUNITY)
Admission: EM | Disposition: A | Discharge status: Home / Self Care | Payer: Self-pay | Source: Home / Self Care | Attending: Emergency Medicine

## 2014-01-14 DIAGNOSIS — E785 Hyperlipidemia, unspecified: Secondary | ICD-10-CM | POA: Diagnosis not present

## 2014-01-14 DIAGNOSIS — W1789XA Other fall from one level to another, initial encounter: Secondary | ICD-10-CM | POA: Insufficient documentation

## 2014-01-14 DIAGNOSIS — Y9289 Other specified places as the place of occurrence of the external cause: Secondary | ICD-10-CM | POA: Insufficient documentation

## 2014-01-14 DIAGNOSIS — M8580 Other specified disorders of bone density and structure, unspecified site: Secondary | ICD-10-CM | POA: Diagnosis not present

## 2014-01-14 DIAGNOSIS — Z85850 Personal history of malignant neoplasm of thyroid: Secondary | ICD-10-CM | POA: Insufficient documentation

## 2014-01-14 DIAGNOSIS — Y9389 Activity, other specified: Secondary | ICD-10-CM | POA: Insufficient documentation

## 2014-01-14 DIAGNOSIS — Y998 Other external cause status: Secondary | ICD-10-CM | POA: Insufficient documentation

## 2014-01-14 DIAGNOSIS — S52509A Unspecified fracture of the lower end of unspecified radius, initial encounter for closed fracture: Secondary | ICD-10-CM | POA: Diagnosis present

## 2014-01-14 DIAGNOSIS — S52572A Other intraarticular fracture of lower end of left radius, initial encounter for closed fracture: Secondary | ICD-10-CM | POA: Insufficient documentation

## 2014-01-14 DIAGNOSIS — S62102A Fracture of unspecified carpal bone, left wrist, initial encounter for closed fracture: Secondary | ICD-10-CM

## 2014-01-14 DIAGNOSIS — E890 Postprocedural hypothyroidism: Secondary | ICD-10-CM | POA: Diagnosis not present

## 2014-01-14 HISTORY — PX: OPEN REDUCTION INTERNAL FIXATION (ORIF) DISTAL RADIAL FRACTURE: SHX5989

## 2014-01-14 LAB — CBC WITH DIFFERENTIAL/PLATELET
BASOS ABS: 0 10*3/uL (ref 0.0–0.1)
Basophils Relative: 0 % (ref 0–1)
EOS ABS: 0.1 10*3/uL (ref 0.0–0.7)
Eosinophils Relative: 1 % (ref 0–5)
HCT: 40.4 % (ref 36.0–46.0)
Hemoglobin: 13.1 g/dL (ref 12.0–15.0)
Lymphocytes Relative: 14 % (ref 12–46)
Lymphs Abs: 1.1 10*3/uL (ref 0.7–4.0)
MCH: 27.6 pg (ref 26.0–34.0)
MCHC: 32.4 g/dL (ref 30.0–36.0)
MCV: 85.2 fL (ref 78.0–100.0)
MONOS PCT: 6 % (ref 3–12)
Monocytes Absolute: 0.5 10*3/uL (ref 0.1–1.0)
NEUTROS PCT: 79 % — AB (ref 43–77)
Neutro Abs: 6.2 10*3/uL (ref 1.7–7.7)
Platelets: 332 10*3/uL (ref 150–400)
RBC: 4.74 MIL/uL (ref 3.87–5.11)
RDW: 13.1 % (ref 11.5–15.5)
WBC: 7.8 10*3/uL (ref 4.0–10.5)

## 2014-01-14 LAB — COMPREHENSIVE METABOLIC PANEL
ALT: 9 U/L (ref 0–35)
AST: 18 U/L (ref 0–37)
Albumin: 4 g/dL (ref 3.5–5.2)
Alkaline Phosphatase: 58 U/L (ref 39–117)
Anion gap: 13 (ref 5–15)
BILIRUBIN TOTAL: 0.5 mg/dL (ref 0.3–1.2)
BUN: 16 mg/dL (ref 6–23)
CALCIUM: 8.5 mg/dL (ref 8.4–10.5)
CO2: 26 meq/L (ref 19–32)
Chloride: 99 mEq/L (ref 96–112)
Creatinine, Ser: 0.8 mg/dL (ref 0.50–1.10)
GFR, EST AFRICAN AMERICAN: 76 mL/min — AB (ref >90)
GFR, EST NON AFRICAN AMERICAN: 66 mL/min — AB (ref >90)
GLUCOSE: 114 mg/dL — AB (ref 70–99)
Potassium: 4 mEq/L (ref 3.7–5.3)
Sodium: 138 mEq/L (ref 137–147)
Total Protein: 8.4 g/dL — ABNORMAL HIGH (ref 6.0–8.3)

## 2014-01-14 SURGERY — OPEN REDUCTION INTERNAL FIXATION (ORIF) DISTAL RADIUS FRACTURE
Anesthesia: General | Site: Arm Lower | Laterality: Left

## 2014-01-14 MED ORDER — ADULT MULTIVITAMIN W/MINERALS CH
1.0000 | ORAL_TABLET | Freq: Every day | ORAL | Status: DC
  Administered 2014-01-14 – 2014-01-15 (×2): 1 via ORAL
  Filled 2014-01-14 (×2): qty 1

## 2014-01-14 MED ORDER — LIDOCAINE HCL (CARDIAC) 20 MG/ML IV SOLN
INTRAVENOUS | Status: DC | PRN
  Administered 2014-01-14: 20 mg via INTRAVENOUS

## 2014-01-14 MED ORDER — MIDAZOLAM HCL 2 MG/2ML IJ SOLN
INTRAMUSCULAR | Status: AC
  Filled 2014-01-14: qty 2

## 2014-01-14 MED ORDER — CEFAZOLIN SODIUM 1-5 GM-% IV SOLN
INTRAVENOUS | Status: AC
  Filled 2014-01-14: qty 50

## 2014-01-14 MED ORDER — METHOCARBAMOL 500 MG PO TABS
ORAL_TABLET | ORAL | Status: AC
  Filled 2014-01-14: qty 1

## 2014-01-14 MED ORDER — FENTANYL CITRATE 0.05 MG/ML IJ SOLN
INTRAMUSCULAR | Status: DC | PRN
  Administered 2014-01-14: 50 ug via INTRAVENOUS

## 2014-01-14 MED ORDER — BUPIVACAINE HCL (PF) 0.25 % IJ SOLN
INTRAMUSCULAR | Status: DC | PRN
  Administered 2014-01-14: 30 mL

## 2014-01-14 MED ORDER — METHOCARBAMOL 1000 MG/10ML IJ SOLN
500.0000 mg | Freq: Four times a day (QID) | INTRAVENOUS | Status: DC | PRN
  Filled 2014-01-14: qty 5

## 2014-01-14 MED ORDER — METHOCARBAMOL 500 MG PO TABS
500.0000 mg | ORAL_TABLET | Freq: Four times a day (QID) | ORAL | Status: DC | PRN
Start: 2014-01-14 — End: 2014-01-15
  Administered 2014-01-14: 500 mg via ORAL

## 2014-01-14 MED ORDER — MORPHINE SULFATE 2 MG/ML IJ SOLN: 1.0000 mg | INTRAMUSCULAR | Status: DC | PRN

## 2014-01-14 MED ORDER — PHENYLEPHRINE HCL 10 MG/ML IJ SOLN
INTRAMUSCULAR | Status: DC | PRN
  Administered 2014-01-14: 120 ug via INTRAVENOUS
  Administered 2014-01-14: 40 ug via INTRAVENOUS
  Administered 2014-01-14 (×3): 80 ug via INTRAVENOUS

## 2014-01-14 MED ORDER — KCL IN DEXTROSE-NACL 20-5-0.45 MEQ/L-%-% IV SOLN
INTRAVENOUS | Status: AC
  Filled 2014-01-14: qty 1000

## 2014-01-14 MED ORDER — ONDANSETRON HCL 4 MG/2ML IJ SOLN
4.0000 mg | Freq: Four times a day (QID) | INTRAMUSCULAR | Status: DC | PRN
  Administered 2014-01-14: 4 mg via INTRAVENOUS
  Filled 2014-01-14: qty 2

## 2014-01-14 MED ORDER — HYDROCODONE-ACETAMINOPHEN 5-325 MG PO TABS
1.0000 | ORAL_TABLET | ORAL | Status: DC | PRN
Start: 2014-01-14 — End: 2014-01-15
  Administered 2014-01-14: 1 via ORAL
  Filled 2014-01-14: qty 1

## 2014-01-14 MED ORDER — DIPHENHYDRAMINE HCL 25 MG PO CAPS: 25.0000 mg | ORAL_CAPSULE | Freq: Four times a day (QID) | ORAL | Status: DC | PRN

## 2014-01-14 MED ORDER — VITAMIN C 500 MG PO TABS: 500.0000 mg | ORAL_TABLET | Freq: Every day | ORAL | Status: DC

## 2014-01-14 MED ORDER — BUPIVACAINE HCL (PF) 0.25 % IJ SOLN
INTRAMUSCULAR | Status: AC
  Filled 2014-01-14: qty 30

## 2014-01-14 MED ORDER — LACTATED RINGERS IV SOLN
INTRAVENOUS | Status: DC | PRN
  Administered 2014-01-14: 18:00:00 via INTRAVENOUS

## 2014-01-14 MED ORDER — ONDANSETRON HCL 4 MG PO TABS: 4.0000 mg | ORAL_TABLET | Freq: Four times a day (QID) | ORAL | Status: DC | PRN

## 2014-01-14 MED ORDER — HYDROCODONE-ACETAMINOPHEN 5-300 MG PO TABS: 1.0000 | ORAL_TABLET | Freq: Four times a day (QID) | ORAL | Status: DC | PRN

## 2014-01-14 MED ORDER — KETOROLAC TROMETHAMINE 15 MG/ML IJ SOLN: 15.0000 mg | Freq: Once | INTRAMUSCULAR | Status: DC | PRN

## 2014-01-14 MED ORDER — KCL IN DEXTROSE-NACL 20-5-0.45 MEQ/L-%-% IV SOLN
INTRAVENOUS | Status: DC
  Filled 2014-01-14 (×2): qty 1000

## 2014-01-14 MED ORDER — CEFAZOLIN SODIUM 1-5 GM-% IV SOLN
1.0000 g | Freq: Three times a day (TID) | INTRAVENOUS | Status: DC
  Administered 2014-01-15: 1 g via INTRAVENOUS
  Filled 2014-01-14 (×3): qty 50

## 2014-01-14 MED ORDER — PROPOFOL 10 MG/ML IV BOLUS
INTRAVENOUS | Status: AC
  Filled 2014-01-14: qty 20

## 2014-01-14 MED ORDER — LEVOTHYROXINE SODIUM 100 MCG PO TABS
100.0000 ug | ORAL_TABLET | Freq: Every day | ORAL | Status: DC
  Administered 2014-01-15: 100 ug via ORAL
  Filled 2014-01-14 (×2): qty 1

## 2014-01-14 MED ORDER — CEFAZOLIN SODIUM-DEXTROSE 2-3 GM-% IV SOLR
INTRAVENOUS | Status: AC
  Filled 2014-01-14: qty 50

## 2014-01-14 MED ORDER — HYDROCODONE-ACETAMINOPHEN 5-325 MG PO TABS
1.0000 | ORAL_TABLET | Freq: Once | ORAL | Status: AC
  Administered 2014-01-14: 1 via ORAL
  Filled 2014-01-14: qty 1

## 2014-01-14 MED ORDER — VITAMIN C 500 MG PO TABS
1000.0000 mg | ORAL_TABLET | Freq: Every day | ORAL | Status: DC
  Administered 2014-01-14 – 2014-01-15 (×2): 1000 mg via ORAL
  Filled 2014-01-14 (×2): qty 2

## 2014-01-14 MED ORDER — CEFAZOLIN SODIUM 1-5 GM-% IV SOLN
1.0000 g | INTRAVENOUS | Status: AC
  Administered 2014-01-14: 1 g via INTRAVENOUS

## 2014-01-14 MED ORDER — DOCUSATE SODIUM 100 MG PO CAPS
100.0000 mg | ORAL_CAPSULE | Freq: Two times a day (BID) | ORAL | Status: DC
  Administered 2014-01-14 – 2014-01-15 (×2): 100 mg via ORAL
  Filled 2014-01-14 (×3): qty 1

## 2014-01-14 MED ORDER — SODIUM CHLORIDE 0.9 % IV SOLN
Freq: Once | INTRAVENOUS | Status: AC
  Administered 2014-01-14: 100 mL/h via INTRAVENOUS

## 2014-01-14 MED ORDER — ONDANSETRON HCL 4 MG/2ML IJ SOLN
INTRAMUSCULAR | Status: DC | PRN
  Administered 2014-01-14: 4 mg via INTRAVENOUS

## 2014-01-14 MED ORDER — METOCLOPRAMIDE HCL 5 MG/ML IJ SOLN: 10.0000 mg | Freq: Once | INTRAMUSCULAR | Status: DC | PRN

## 2014-01-14 MED ORDER — LACTATED RINGERS IV SOLN: INTRAVENOUS | Status: DC

## 2014-01-14 MED ORDER — CEFAZOLIN SODIUM-DEXTROSE 2-3 GM-% IV SOLR
2.0000 g | INTRAVENOUS | Status: AC
  Administered 2014-01-14: 2 g via INTRAVENOUS

## 2014-01-14 MED ORDER — FENTANYL CITRATE 0.05 MG/ML IJ SOLN
25.0000 ug | Freq: Once | INTRAMUSCULAR | Status: AC
  Administered 2014-01-14: 25 ug via INTRAVENOUS
  Filled 2014-01-14: qty 2

## 2014-01-14 MED ORDER — CHLORHEXIDINE GLUCONATE 4 % EX LIQD: 60.0000 mL | Freq: Once | CUTANEOUS | Status: DC

## 2014-01-14 MED ORDER — OXYCODONE-ACETAMINOPHEN 5-325 MG PO TABS: 1.0000 | ORAL_TABLET | ORAL | Status: DC | PRN

## 2014-01-14 MED ORDER — FENTANYL CITRATE 0.05 MG/ML IJ SOLN
INTRAMUSCULAR | Status: AC
  Filled 2014-01-14: qty 5

## 2014-01-14 MED ORDER — DOCUSATE SODIUM 100 MG PO CAPS: 100.0000 mg | ORAL_CAPSULE | Freq: Two times a day (BID) | ORAL | Status: DC

## 2014-01-14 MED ORDER — CALCIUM CARBONATE 1250 (500 CA) MG PO TABS
1250.0000 mg | ORAL_TABLET | Freq: Every day | ORAL | Status: DC
  Administered 2014-01-15 (×2): 1250 mg via ORAL
  Filled 2014-01-14 (×3): qty 1

## 2014-01-14 MED ORDER — FENTANYL CITRATE 0.05 MG/ML IJ SOLN: 25.0000 ug | INTRAMUSCULAR | Status: DC | PRN

## 2014-01-14 MED ORDER — PROPOFOL 10 MG/ML IV BOLUS
INTRAVENOUS | Status: DC | PRN
  Administered 2014-01-14: 120 mg via INTRAVENOUS

## 2014-01-14 MED ORDER — 0.9 % SODIUM CHLORIDE (POUR BTL) OPTIME
TOPICAL | Status: DC | PRN
  Administered 2014-01-14: 1000 mL

## 2014-01-14 SURGICAL SUPPLY — 63 items
BANDAGE ELASTIC 3 VELCRO ST LF (GAUZE/BANDAGES/DRESSINGS) ×3 IMPLANT
BANDAGE ELASTIC 4 VELCRO ST LF (GAUZE/BANDAGES/DRESSINGS) ×3 IMPLANT
BIT DRILL 2.2 SS TIBIAL (BIT) ×2 IMPLANT
BLADE SURG ROTATE 9660 (MISCELLANEOUS) IMPLANT
BNDG CMPR 9X4 STRL LF SNTH (GAUZE/BANDAGES/DRESSINGS) ×1
BNDG ESMARK 4X9 LF (GAUZE/BANDAGES/DRESSINGS) ×3 IMPLANT
BNDG GAUZE ELAST 4 BULKY (GAUZE/BANDAGES/DRESSINGS) ×3 IMPLANT
CANISTER SUCTION 2500CC (MISCELLANEOUS) ×3 IMPLANT
CLOSURE WOUND 1/2 X4 (GAUZE/BANDAGES/DRESSINGS)
CORDS BIPOLAR (ELECTRODE) ×3 IMPLANT
COVER SURGICAL LIGHT HANDLE (MISCELLANEOUS) ×3 IMPLANT
CUFF TOURNIQUET SINGLE 18IN (TOURNIQUET CUFF) ×3 IMPLANT
CUFF TOURNIQUET SINGLE 24IN (TOURNIQUET CUFF) IMPLANT
DRAIN TLS ROUND 10FR (DRAIN) IMPLANT
DRAPE OEC MINIVIEW 54X84 (DRAPES) ×3 IMPLANT
DRAPE SURG 17X11 SM STRL (DRAPES) ×3 IMPLANT
DRSG ADAPTIC 3X8 NADH LF (GAUZE/BANDAGES/DRESSINGS) ×3 IMPLANT
ELECT REM PT RETURN 9FT ADLT (ELECTROSURGICAL)
ELECTRODE REM PT RTRN 9FT ADLT (ELECTROSURGICAL) IMPLANT
GAUZE SPONGE 4X4 12PLY STRL (GAUZE/BANDAGES/DRESSINGS) ×3 IMPLANT
GAUZE SPONGE 4X4 16PLY XRAY LF (GAUZE/BANDAGES/DRESSINGS) ×3 IMPLANT
GLOVE BIOGEL PI IND STRL 8.5 (GLOVE) ×1 IMPLANT
GLOVE BIOGEL PI INDICATOR 8.5 (GLOVE) ×2
GLOVE SURG ORTHO 8.0 STRL STRW (GLOVE) ×3 IMPLANT
GOWN STRL REUS W/ TWL LRG LVL3 (GOWN DISPOSABLE) ×1 IMPLANT
GOWN STRL REUS W/ TWL XL LVL3 (GOWN DISPOSABLE) ×1 IMPLANT
GOWN STRL REUS W/TWL LRG LVL3 (GOWN DISPOSABLE) ×3
GOWN STRL REUS W/TWL XL LVL3 (GOWN DISPOSABLE) ×3
GUIDEWIRE ORTH 6X062XTROC NS (WIRE) IMPLANT
K-WIRE .062 (WIRE) ×3
KIT BASIN OR (CUSTOM PROCEDURE TRAY) ×3 IMPLANT
KIT ROOM TURNOVER OR (KITS) ×3 IMPLANT
MANIFOLD NEPTUNE II (INSTRUMENTS) ×3 IMPLANT
NDL HYPO 25X1 1.5 SAFETY (NEEDLE) ×1 IMPLANT
NEEDLE HYPO 25X1 1.5 SAFETY (NEEDLE) ×3 IMPLANT
NS IRRIG 1000ML POUR BTL (IV SOLUTION) ×3 IMPLANT
PACK ORTHO EXTREMITY (CUSTOM PROCEDURE TRAY) ×3 IMPLANT
PAD ARMBOARD 7.5X6 YLW CONV (MISCELLANEOUS) ×6 IMPLANT
PAD CAST 4YDX4 CTTN HI CHSV (CAST SUPPLIES) ×1 IMPLANT
PADDING CAST COTTON 4X4 STRL (CAST SUPPLIES) ×3
PEG LOCKING SMOOTH 2.2X18 (Peg) ×2 IMPLANT
PEG LOCKING SMOOTH 2.2X20 (Screw) ×8 IMPLANT
PEG LOCKING SMOOTH 2.2X22 (Screw) ×2 IMPLANT
PLATE NARROW DVR LEFT (Plate) ×2 IMPLANT
SCREW LOCK 14X2.7X 3 LD TPR (Screw) IMPLANT
SCREW LOCKING 2.7X14 (Screw) ×12 IMPLANT
SCREW LOCKING 2.7X15MM (Screw) ×2 IMPLANT
SOAP 2 % CHG 4 OZ (WOUND CARE) ×3 IMPLANT
SPLINT FIBERGLASS 3X35 (CAST SUPPLIES) ×2 IMPLANT
SPONGE LAP 4X18 X RAY DECT (DISPOSABLE) ×3 IMPLANT
STRIP CLOSURE SKIN 1/2X4 (GAUZE/BANDAGES/DRESSINGS) IMPLANT
SUT ETHILON 4 0 PS 2 18 (SUTURE) IMPLANT
SUT MNCRL AB 4-0 PS2 18 (SUTURE) IMPLANT
SUT VIC AB 2-0 FS1 27 (SUTURE) ×2 IMPLANT
SUT VICRYL 4-0 PS2 18IN ABS (SUTURE) ×2 IMPLANT
SYR CONTROL 10ML LL (SYRINGE) IMPLANT
SYSTEM CHEST DRAIN TLS 7FR (DRAIN) IMPLANT
TOWEL OR 17X24 6PK STRL BLUE (TOWEL DISPOSABLE) ×3 IMPLANT
TOWEL OR 17X26 10 PK STRL BLUE (TOWEL DISPOSABLE) ×3 IMPLANT
TUBE CONNECTING 12'X1/4 (SUCTIONS) ×1
TUBE CONNECTING 12X1/4 (SUCTIONS) ×2 IMPLANT
WATER STERILE IRR 1000ML POUR (IV SOLUTION) ×3 IMPLANT
YANKAUER SUCT BULB TIP NO VENT (SUCTIONS) IMPLANT

## 2014-01-14 NOTE — Op Note (Signed)
PREOPERATIVE DIAGNOSIS: Left wrist intra-articular distal radius  fracture, 3 or more fragments.   POSTOPERATIVE DIAGNOSIS: Left wrist intra-articular distal radius  fracture, 3 or more fragments.   ATTENDING PHYSICIAN: Linna Hoff IV, MD who scrubbed and present  entire procedure.   ASSISTANT SURGEON: None.   ANESTHESIA: Supraclavicular block performed by Dr. Linna Caprice and general  anesthesia.   SURGICAL IMPLANTS: Biomet Cross Lock narrow plate  SURGICAL PROCEDURE:  1. Open treatment of left wrist intra-articular distal radius  fracture, 3 or more fragments.   2. Left wrist brachioradialis tenotomy and release.   3. Radiographs left wrist 3 view  SURGICAL INDICATIONS: [name] [name] is a right-hand-dominant female  sustained an intra-articular distal radius fracture after a fall. The  patient was seen and evaluated in the Hospital and  based on degree of  displacement, recommended that she undergo  the above procedure. Risks, benefits, and alternatives were discussed  in detail with the patient. Signed informed consent was obtained.  Risks include, but not limited to bleeding, infection, damage to nearby  nerves, arteries, or tendons, nonunion, malunion, hardware failure, loss  of motion of the elbow, wrist, and digits, and need for further surgical  intervention.   PROCEDURE: The patient was properly identified in the preop holding  area. A mark with a permanent marker was made on the left wrist to  indicate correct operative site. The patient tolerated the block  performed by Anesthesia. The patient was then brought back to the  operating room. The patient received preoperative antibiotics. General  anesthesia was induced. Left upper extremity was prepped and draped in  normal sterile fashion. Time-out was called. Correct site was  identified, and procedure then begun. Attention was then turned to the  left wrist. The limb was then elevated using Esmarch exsanguination and   tourniquet insufflated. A longitudinal incision was made directly over  the FCR sheath. Dissection was then carried down through the skin and  subcutaneous tissue. The FCR sheath was then opened proximally and  distally. Careful dissection was done going through the floor of the  FCR sheath where the FPL was identified. An L-shaped pronator quadratus  flap was then elevated. In order to aiding reduction of the radial condyle brachioradialis was  then released, careful dissection was then carried out to release and  tenotomize the brachioradialis off the radial styloid and make sure to  Protect the 1st dorsal compartment tendons. The fracture site was then opened and the  patient did have several fracture fragment extending in a fracture line,  extending into the joint through intra-articular fragment. Careful open  reduction was then carried out. The narrow DVR Cross lock plate was  then applied. The oblong screw hole was then drilled with a 2.2 mm  drill bit, then 2.4 mm bicortical screw. Plate height was adjusted.  After position was then confirmed using mini C-arm, the distal row  fixation was then carried out with the beginning from an ulnar to radial  direction with the 1.2-mm drill bit and 2.4 mm  locking pegs.  The total of 6 locking pegs were then placed. Following this, attention  was then turned proximally where 2 more locking screws were then placed  And 2 more nonlocking srews.   The wound was then thoroughly irrigated. Final stress radiography was then carried out.   Stress radiographs were then obtained under live fluoro showing no widening of the SL interval. I  did not see any carpal dissociation with good fixation,  without any  evidence of penetration in the articular margin with the locking pegs.  Postop, the pronator quadratus was then closed with 2-0 Vicryl.  Tourniquet was then deflated. Hemostasis was then obtained. The  subcutaneous tissues closed with 4-0 Vicryl  and skin closed with simple  prolene sutures. Adaptic dressing and sterile compressive bandage was  then applied. The patient was then placed in a well-padded sugar-tong  splint. Extubated and taken to recovery room in good condition.    RADIOGRAPHIC INTERPRETATION:, 3 views of the wrist do show  the volar plate fixation in place. There is good position in both  planes.   POSTOPERATIVE PLAN: The patient will be admitted for IV antibiotics and  pain control; discharged in the morning. Seen back in the office for  approximately 10-14 days for wound check, suture removal, and then x-  rays, short-arm cast for total 4 weeks, and then begin a therapy regimen  around a 4-week mark. Radiographs at each visit.   [name] Nakayama, MD

## 2014-01-14 NOTE — H&P (Signed)
[name] [name] is an 80 y.o. female.   Chief Complaint: LEFT WRIST INJURY HPI: [name] [name] is a 80 y.o. female who presents to the Emergency Department complaining of L wrist injury sustained PTA. Pt states that she was getting out of the car when she tripped and fell on her L wrist. She denies HA, dizziness, light-headedness. No other complaints at this time. No other injuries. No numbness or weakness in her hand. Did not hit her head or had LOC.   Past Medical History  Diagnosis Date  . Malignant neoplasm of thyroid gland 03/28/2007    5/07: righte lobectomy--path shows 3 cm follicular ca 2/94: completion left lobectomy, no more tumor seen 09/07: thyroglobulin 0.2 9ab neg) 11/07: i-131 rx, [facility] 6/08: thyroglogulin 0.2 (ab neg) , hypothyroid body scan neg (clinically tolerated hypothyroidism poorly) 06/09: tg undetectable (ab=1.5, normal) 12/10: tg undetectable (ab neg) 6/11: tg undectable (ab neg)  . HYPOTHYROIDISM, POSTSURGICAL 03/28/2007  . HYPERLIPIDEMIA 11/04/2006  . ALLERGIC RHINITIS 11/02/2006  . OSTEOPENIA 11/02/2006    Past Surgical History  Procedure Laterality Date  . Abdominal hysterectomy    . Cervical polypectomy      Family History  Problem Relation Age of Onset  . Diabetes Sister    Social History:  reports that she has never smoked. She does not have any smokeless tobacco history on file. Her alcohol and drug histories are not on file.  Allergies: No Known Allergies   (Not in a hospital admission)  Results for orders placed during the hospital encounter of 01/14/14 (from the past 48 hour(s))  CBC WITH DIFFERENTIAL     Status: Abnormal   Collection Time    01/14/14  1:00 PM      Result Value Ref Range   WBC 7.8  4.0 - 10.5 K/uL   RBC 4.74  3.87 - 5.11 MIL/uL   Hemoglobin 13.1  12.0 - 15.0 g/dL   HCT 40.4  36.0 - 46.0 %   MCV 85.2  78.0 - 100.0 fL   MCH 27.6  26.0 - 34.0 pg   MCHC 32.4  30.0 - 36.0 g/dL   RDW 13.1  11.5 - 15.5 %   Platelets 332  150 -  400 K/uL   Neutrophils Relative % 79 (*) 43 - 77 %   Neutro Abs 6.2  1.7 - 7.7 K/uL   Lymphocytes Relative 14  12 - 46 %   Lymphs Abs 1.1  0.7 - 4.0 K/uL   Monocytes Relative 6  3 - 12 %   Monocytes Absolute 0.5  0.1 - 1.0 K/uL   Eosinophils Relative 1  0 - 5 %   Eosinophils Absolute 0.1  0.0 - 0.7 K/uL   Basophils Relative 0  0 - 1 %   Basophils Absolute 0.0  0.0 - 0.1 K/uL  COMPREHENSIVE METABOLIC PANEL     Status: Abnormal   Collection Time    01/14/14  1:00 PM      Result Value Ref Range   Sodium 138  137 - 147 mEq/L   Potassium 4.0  3.7 - 5.3 mEq/L   Chloride 99  96 - 112 mEq/L   CO2 26  19 - 32 mEq/L   Glucose, Bld 114 (*) 70 - 99 mg/dL   BUN 16  6 - 23 mg/dL   Creatinine, Ser 0.80  0.50 - 1.10 mg/dL   Calcium 8.5  8.4 - 10.5 mg/dL   Total Protein 8.4 (*) 6.0 - 8.3 g/dL  Albumin 4.0  3.5 - 5.2 g/dL   AST 18  0 - 37 U/L   ALT 9  0 - 35 U/L   Alkaline Phosphatase 58  39 - 117 U/L   Total Bilirubin 0.5  0.3 - 1.2 mg/dL   GFR calc non Af Amer 66 (*) >90 mL/min   GFR calc Af Amer 76 (*) >90 mL/min   Comment: (NOTE)     The eGFR has been calculated using the CKD EPI equation.     This calculation has not been validated in all clinical situations.     eGFR's persistently <90 mL/min signify possible Chronic Kidney     Disease.   Anion gap 13  5 - 15   Dg Chest 2 View  01/14/2014   CLINICAL DATA:  Preoperative examination (left wrist fracture). Initial encounter.  EXAM: CHEST  2 VIEW  COMPARISON:  03/12/2011  FINDINGS: Grossly unchanged enlarged cardiac silhouette with atherosclerotic plaque within the thoracic aorta. The lungs remain mildly hyperexpanded with mild diffuse slightly nodular thickening of the pulmonary interstitium. Evaluation of the retrosternal clear space obscured secondary to overlying soft tissues. Grossly unchanged bilateral infrahilar heterogeneous opacities favored to represent atelectasis. No new focal airspace opacities. No pleural effusion or  pneumothorax. No evidence of edema. No acute osseus abnormalities.  IMPRESSION: Cardiomegaly, lung hyperexpansion and bronchitic change without acute cardiopulmonary disease.   Electronically Signed   By: Sandi Mariscal M.D.   On: 01/14/2014 13:38   Dg Wrist Complete Left  01/14/2014   CLINICAL DATA:  Fall.  Wrist pain and deformity.  Swelling.  EXAM: LEFT WRIST - COMPLETE 3+ VIEW  COMPARISON:  None.  FINDINGS: Transverse mildly comminuted fracture of the distal radial metaphysis. Distal fragment is displaced laterally by 6 mm and posteriorly by 12 mm with apex anterior angulation. The fracture excellent extends into the distal radioulnar joint and there is potentially a nondisplaced longitudinal component extending into the distal radial articular surface although this is uncertain and mostly based on mild irregularity of the posterior radial rim on the frontal and oblique views.  Borderline widening of the scapholunate space.  IMPRESSION: 1. Displaced transverse fracture of the distal radial metaphysis. Possible nondisplaced linear extension to the distal radial articular surface. The fracture does extend into the radial side of the distal radioulnar joint. 2. Borderline widening of the scapholunate space potentially representing a tear of the scapholunate ligament.   Electronically Signed   By: Sherryl Barters M.D.   On: 01/14/2014 12:38    ROS NO RECENT ILLNESSES OR HOSPITALIZATIONS  Blood pressure 171/67, pulse 67, temperature 97.6 F (36.4 C), temperature source Oral, resp. rate 16, SpO2 96.00%. Physical Exam  General Appearance:  Alert, cooperative, no distress, appears stated age  Head:  Normocephalic, without obvious abnormality, atraumatic  Eyes:  Pupils equal, conjunctiva/corneas clear,         Throat: Lips, mucosa, and tongue normal; teeth and gums normal  Neck: No visible masses     Lungs:   respirations unlabored  Chest Wall:  No tenderness or deformity  Heart:  Regular rate and  rhythm,  Abdomen:   Soft, non-tender,         Extremities: LUE: SKIN INTACT FINGERS WARM WELL PERFUSED LIMITED WRIST AND FOREARM MOBILITY ABLE TO EXTEND THUMB   Pulses: 2+ and symmetric  Skin: Skin color, texture, turgor normal, no rashes or lesions     Neurologic: Normal    Assessment/Plan LEFT DISTAL RADIUS COMMINUTED INTRAARTICULAR DISTAL RADIUS FRACTURE  LEFT WRIST OPEN REDUCTION AND INTERNAL FIXATION  R/B/A DISCUSSED WITH PT IN HOSPITAL.  PT VOICED UNDERSTANDING OF PLAN CONSENT SIGNED DAY OF SURGERY PT SEEN AND EXAMINED PRIOR TO OPERATIVE PROCEDURE/DAY OF SURGERY SITE MARKED. QUESTIONS ANSWERED WILL REMAIN OVERNIGHT OBSERVATION FOLLOWING SURGERY  WE ARE PLANNING SURGERY FOR YOUR UPPER EXTREMITY. THE RISKS AND BENEFITS OF SURGERY INCLUDE BUT NOT LIMITED TO BLEEDING INFECTION, DAMAGE TO NEARBY NERVES ARTERIES TENDONS, FAILURE OF SURGERY TO ACCOMPLISH ITS INTENDED GOALS, PERSISTENT SYMPTOMS AND NEED FOR FURTHER SURGICAL INTERVENTION. WITH THIS IN MIND WE WILL PROCEED. I HAVE DISCUSSED WITH THE PATIENT THE PRE AND POSTOPERATIVE REGIMEN AND THE DOS AND DON'TS. PT VOICED UNDERSTANDING AND INFORMED CONSENT SIGNED.  Linna Hoff 01/14/2014, 1700

## 2014-01-14 NOTE — ED Notes (Signed)
Pt sts was getting out of the car and fel injuring left wrist. Obvious deformity. Pulse and feeling present.

## 2014-01-14 NOTE — Anesthesia Procedure Notes (Addendum)
Procedure Name: LMA Insertion Date/Time: 01/14/2014 6:05 PM Performed by: Maude Leriche D Pre-anesthesia Checklist: Patient identified, Emergency Drugs available, Suction available, Patient being monitored and Timeout performed Patient Re-evaluated:Patient Re-evaluated prior to inductionOxygen Delivery Method: Circle system utilized Preoxygenation: Pre-oxygenation with 100% oxygen Intubation Type: IV induction Ventilation: Mask ventilation without difficulty LMA: LMA inserted LMA Size: 4.0 Placement Confirmation: positive ETCO2 and breath sounds checked- equal and bilateral Tube secured with: Tape Dental Injury: Teeth and Oropharynx as per pre-operative assessment    Anesthesia Regional Block:  Supraclavicular block  Pre-Anesthetic Checklist: ,, timeout performed, Correct Patient, Correct Site, Correct Laterality, Correct Procedure, Correct Position, site marked, Risks and benefits discussed,  Surgical consent,  Pre-op evaluation,  At surgeon's request and post-op pain management  Laterality: Left     Needles:  Injection technique: Single-shot  Needle Type: Stimulator Needle - 80     Needle Length: 9cm 9 cm Needle Gauge: 22 and 22 G    Additional Needles: Supraclavicular block Narrative:  Start time: 01/14/2014 5:35 PM End time: 01/14/2014 5:40 PM Injection made incrementally with aspirations every 5 mL.  Performed by: Personally   Additional Notes: 25 cc 0.5% Marcaine 1:200 Epi injected easily

## 2014-01-14 NOTE — ED Notes (Signed)
Ortho tech notified.  

## 2014-01-14 NOTE — Progress Notes (Signed)
Orthopedic Tech Progress Note Patient Details:  [name] [name] 1927/11/16 [id number]  Ortho Devices Type of Ortho Device: Ace wrap;Sugartong splint Ortho Device/Splint Location: lue Ortho Device/Splint Interventions: Application   Tyreesha Maharaj 01/14/2014, 2:22 PM

## 2014-01-14 NOTE — ED Provider Notes (Signed)
CSN: [id number]     Arrival date & time 01/14/14  1045 History  This chart was scribed for non-physician practitioner, Renold Genta, PA-C, working with Mariea Clonts, MD by Ladene Artist, ED Scribe. This patient was seen in room TR07C/TR07C and the patient's care was started at 11:07 AM.   Chief Complaint  Patient presents with  . Wrist Injury   The history is provided by the patient. No language interpreter was used.   HPI Comments: [name] [name] is a 80 y.o. female who presents to the Emergency Department complaining of L wrist injury sustained PTA. Pt states that she was getting out of the car when she tripped and fell on her L wrist. She denies HA, dizziness, light-headedness. No other complaints at this time. No other injuries. No numbness or weakness in her hand. Did not hit her head or had LOC.  Past Medical History  Diagnosis Date  . Malignant neoplasm of thyroid gland 03/28/2007    5/07: righte lobectomy--path shows 3 cm follicular ca 4/00: completion left lobectomy, no more tumor seen 09/07: thyroglobulin 0.2 9ab neg) 11/07: i-131 rx, [facility] 6/08: thyroglogulin 0.2 (ab neg) , hypothyroid body scan neg (clinically tolerated hypothyroidism poorly) 06/09: tg undetectable (ab=1.5, normal) 12/10: tg undetectable (ab neg) 6/11: tg undectable (ab neg)  . HYPOTHYROIDISM, POSTSURGICAL 03/28/2007  . HYPERLIPIDEMIA 11/04/2006  . ALLERGIC RHINITIS 11/02/2006  . OSTEOPENIA 11/02/2006   Past Surgical History  Procedure Laterality Date  . Abdominal hysterectomy    . Cervical polypectomy     Family History  Problem Relation Age of Onset  . Diabetes Sister    History  Substance Use Topics  . Smoking status: Never Smoker   . Smokeless tobacco: Not on file  . Alcohol Use: Not on file   OB History   Grav Para Term Preterm Abortions TAB SAB Ect Mult Living                 Review of Systems  Musculoskeletal: Positive for arthralgias.  Neurological: Negative for dizziness,  light-headedness and headaches.  All other systems reviewed and are negative.  Allergies  Review of patient's allergies indicates no known allergies.  Home Medications   Prior to Admission medications   Medication Sig Start Date End Date Taking? Authorizing Provider  acyclovir cream (ZOVIRAX) 5 % Apply 1 application topically every 3 (three) hours. 08/01/12   Lucretia Kern, DO  calcium carbonate (OS-CAL) 600 MG TABS Take 600 mg by mouth 2 (two) times daily with a meal.      Historical Provider, MD  citalopram (CELEXA) 20 MG tablet Take 1 tablet (20 mg total) by mouth daily. 11/15/11 11/14/12  Marletta Lor, MD  levothyroxine (SYNTHROID, LEVOTHROID) 100 MCG tablet Take 1 tablet (100 mcg total) by mouth daily before breakfast. 09/16/13   Renato Shin, MD  Multiple Vitamins-Minerals (MULTIVITAMINS THER. W/MINERALS) TABS Take 1 tablet by mouth daily.      Historical Provider, MD   Triage Vitals: BP 170/88  Pulse 88  Temp(Src) 98.3 F (36.8 C)  Resp 18  SpO2 98% Physical Exam  Nursing note and vitals reviewed. Constitutional: She is oriented to person, place, and time. She appears well-developed and well-nourished.  HENT:  Head: Normocephalic and atraumatic.  Eyes: Conjunctivae and EOM are normal.  Neck: Neck supple.  Cardiovascular: Normal rate.   Pulmonary/Chest: Effort normal.  Musculoskeletal: Normal range of motion.  Deformity noted to the left the wrist. Tender to palpation diffusely. Normal hand.  Normal elbow. Radial pulses intact. Hand is warm, pink, Refill less than 2 seconds in all fingertips. Sensation intact in all dermatomes.  Neurological: She is alert and oriented to person, place, and time.  Skin: Skin is warm and dry.  Psychiatric: She has a normal mood and affect. Her behavior is normal.   ED Course  Procedures (including critical care time) DIAGNOSTIC STUDIES: Oxygen Saturation is 98% on RA, normal by my interpretation.    COORDINATION OF CARE: 11:11  AM-Discussed treatment plan which includesXR with pt at bedside and pt agreed to plan.   Labs Review Labs Reviewed  CBC WITH DIFFERENTIAL - Abnormal; Notable for the following:    Neutrophils Relative % 79 (*)    All other components within normal limits  COMPREHENSIVE METABOLIC PANEL   Imaging Review Dg Chest 2 View  01/14/2014   CLINICAL DATA:  Preoperative examination (left wrist fracture). Initial encounter.  EXAM: CHEST  2 VIEW  COMPARISON:  03/12/2011  FINDINGS: Grossly unchanged enlarged cardiac silhouette with atherosclerotic plaque within the thoracic aorta. The lungs remain mildly hyperexpanded with mild diffuse slightly nodular thickening of the pulmonary interstitium. Evaluation of the retrosternal clear space obscured secondary to overlying soft tissues. Grossly unchanged bilateral infrahilar heterogeneous opacities favored to represent atelectasis. No new focal airspace opacities. No pleural effusion or pneumothorax. No evidence of edema. No acute osseus abnormalities.  IMPRESSION: Cardiomegaly, lung hyperexpansion and bronchitic change without acute cardiopulmonary disease.   Electronically Signed   By: Sandi Mariscal M.D.   On: 01/14/2014 13:38   Dg Wrist Complete Left  01/14/2014   CLINICAL DATA:  Fall.  Wrist pain and deformity.  Swelling.  EXAM: LEFT WRIST - COMPLETE 3+ VIEW  COMPARISON:  None.  FINDINGS: Transverse mildly comminuted fracture of the distal radial metaphysis. Distal fragment is displaced laterally by 6 mm and posteriorly by 12 mm with apex anterior angulation. The fracture excellent extends into the distal radioulnar joint and there is potentially a nondisplaced longitudinal component extending into the distal radial articular surface although this is uncertain and mostly based on mild irregularity of the posterior radial rim on the frontal and oblique views.  Borderline widening of the scapholunate space.  IMPRESSION: 1. Displaced transverse fracture of the distal  radial metaphysis. Possible nondisplaced linear extension to the distal radial articular surface. The fracture does extend into the radial side of the distal radioulnar joint. 2. Borderline widening of the scapholunate space potentially representing a tear of the scapholunate ligament.   Electronically Signed   By: Sherryl Barters M.D.   On: 01/14/2014 12:38    EKG Interpretation   Date/Time:  Wednesday January 14 2014 13:48:55 EDT Ventricular Rate:  67 PR Interval:  152 QRS Duration: 80 QT Interval:  398 QTC Calculation: 420 R Axis:   -20 Text Interpretation:  Normal sinus rhythm Left ventricular hypertrophy  with repolarization abnormality Abnormal ECG Confirmed by ZAVITZ  MD,  JOSHUA ([id number]) on 01/14/2014 3:21:48 PM      MDM   Final diagnoses:  None   Pt with left wrist deformity, mechanical fall, no head injury. Will get x-ray   Xray showing displaced comminuted fracture with displacement. Spoke with Dr. Arta Silence, will take to OR. Preop labs pending. Pt received norco when came in, otherwise NPO. Will monitor until goes to OR.  Neurovascularly intact.   Filed Vitals:   01/14/14 1102  BP: 170/88  Pulse: 88  Temp: 98.3 F (36.8 C)  Resp: 18  SpO2: 98%  I personally performed the services described in this documentation, which was scribed in my presence. The recorded information has been reviewed and is accurate.    Renold Genta, PA-C 01/15/14 1625

## 2014-01-14 NOTE — Transfer of Care (Signed)
Immediate Anesthesia Transfer of Care Note  Patient: [name] [name]  Procedure(s) Performed: Procedure(s): OPEN REDUCTION INTERNAL FIXATION DISTAL RADIAL FRACTURE (Left)  Patient Location: PACU  Anesthesia Type:General  Level of Consciousness: awake  Airway & Oxygen Therapy: Patient Spontanous Breathing and Patient connected to face mask oxygen  Post-op Assessment: Report given to PACU RN and Post -op Vital signs reviewed and stable  Post vital signs: Reviewed and stable  Complications: No apparent anesthesia complications

## 2014-01-14 NOTE — Anesthesia Preprocedure Evaluation (Signed)
Anesthesia Evaluation  Patient identified by MRN, date of birth, ID band Patient awake    Reviewed: Allergy & Precautions, H&P , NPO status   Airway Mallampati: II TM Distance: >3 FB Neck ROM: Full    Dental  (+) Edentulous Upper, Edentulous Lower   Pulmonary  breath sounds clear to auscultation        Cardiovascular Rhythm:Regular Rate:Normal     Neuro/Psych    GI/Hepatic   Endo/Other    Renal/GU      Musculoskeletal   Abdominal   Peds  Hematology   Anesthesia Other Findings   Reproductive/Obstetrics                           Anesthesia Physical Anesthesia Plan  ASA: II  Anesthesia Plan: General   Post-op Pain Management:    Induction: Intravenous  Airway Management Planned: LMA  Additional Equipment:   Intra-op Plan:   Post-operative Plan:   Informed Consent: I have reviewed the patients History and Physical, chart, labs and discussed the procedure including the risks, benefits and alternatives for the proposed anesthesia with the patient or authorized representative who has indicated his/her understanding and acceptance.     Plan Discussed with: CRNA and Anesthesiologist  Anesthesia Plan Comments:         Anesthesia Quick Evaluation

## 2014-01-14 NOTE — Brief Op Note (Signed)
01/14/2014  3:52 PM  PATIENT:  [name] [name]  80 y.o. female  PRE-OPERATIVE DIAGNOSIS:  Left Distal Radius Fx.  POST-OPERATIVE DIAGNOSIS:  SAME  PROCEDURE:  Procedure(s): OPEN REDUCTION INTERNAL FIXATION (ORIF) DISTAL RADIAL FRACTURE (Left)  SURGEON:  Surgeon(s) and Role:    * Linna Hoff, MD - Primary  PHYSICIAN ASSISTANT: NONE  ASSISTANTS: none   ANESTHESIA:   general  EBL:     BLOOD ADMINISTERED:none  DRAINS: none   LOCAL MEDICATIONS USED:  MARCAINE     SPECIMEN:  No Specimen  DISPOSITION OF SPECIMEN:  N/A  COUNTS:  YES  TOURNIQUET:    DICTATION: .SEE NOTE  PLAN OF CARE: Admit for overnight observation  PATIENT DISPOSITION:  PACU - hemodynamically stable.   Delay start of Pharmacological VTE agent (>24hrs) due to surgical blood loss or risk of bleeding: not applicable

## 2014-01-14 NOTE — Anesthesia Postprocedure Evaluation (Signed)
  Anesthesia Post-op Note  Patient: [name] [name]  Procedure(s) Performed: Procedure(s): OPEN REDUCTION INTERNAL FIXATION DISTAL RADIAL FRACTURE (Left)  Patient Location: PACU  Anesthesia Type:General and block  Level of Consciousness: awake and alert   Airway and Oxygen Therapy: Patient Spontanous Breathing  Post-op Pain: none  Post-op Assessment: Post-op Vital signs reviewed, Patient's Cardiovascular Status Stable and Respiratory Function Stable  Post-op Vital Signs: Reviewed  Filed Vitals:   01/14/14 2000  BP: 162/92  Pulse: 80  Temp:   Resp: 17    Complications: No apparent anesthesia complications

## 2014-01-14 NOTE — Discharge Instructions (Signed)
KEEP BANDAGE CLEAN AND DRY CALL OFFICE FOR F/U APPT [phone] IN 14 DAYS DR [facility] CELL PHONE [phone] KEEP HAND ELEVATED ABOVE HEART OK TO APPLY ICE TO OPERATIVE AREA CONTACT OFFICE IF ANY WORSENING PAIN OR CONCERNS.

## 2014-01-15 ENCOUNTER — Encounter (HOSPITAL_COMMUNITY): Payer: Self-pay | Admitting: Orthopedic Surgery

## 2014-01-15 NOTE — Discharge Summary (Signed)
Physician Discharge Summary  Patient ID: [name] [name] MRN: [id number] DOB/AGE: 1927/06/01 80 y.o.  Admit date: 01/14/2014 Discharge date: 01/15/2014  Admission Diagnoses: Left Distal Radius Fx. Past Medical History  Diagnosis Date  . Malignant neoplasm of thyroid gland 03/28/2007    5/07: righte lobectomy--path shows 3 cm follicular ca 6/38: completion left lobectomy, no more tumor seen 09/07: thyroglobulin 0.2 9ab neg) 11/07: i-131 rx, 101 [facility] 6/08: thyroglogulin 0.2 (ab neg) , hypothyroid body scan neg (clinically tolerated hypothyroidism poorly) 06/09: tg undetectable (ab=1.5, normal) 12/10: tg undetectable (ab neg) 6/11: tg undectable (ab neg)  . HYPOTHYROIDISM, POSTSURGICAL 03/28/2007  . HYPERLIPIDEMIA 11/04/2006  . ALLERGIC RHINITIS 11/02/2006  . OSTEOPENIA 11/02/2006    Discharge Diagnoses:  Active Problems:   Distal radius fracture   Surgeries: Procedure(s): OPEN REDUCTION INTERNAL FIXATION DISTAL RADIAL FRACTURE on 01/14/2014    Consultants:  none  Discharged Condition: Improved  Hospital Course: [name] [name] is an 80 y.o. female who was admitted 01/14/2014 with a chief complaint of  Chief Complaint  Patient presents with  . Wrist Injury  , and found to have a diagnosis of Left Distal Radius Fx..  They were brought to the operating room on 01/14/2014 and underwent Procedure(s): OPEN REDUCTION INTERNAL FIXATION DISTAL RADIAL FRACTURE.    They were given perioperative antibiotics: Anti-infectives   Start     Dose/Rate Route Frequency Ordered Stop   01/15/14 0600  ceFAZolin (ANCEF) IVPB 2 g/50 mL premix     2 g 100 mL/hr over 30 Minutes Intravenous On call to O.R. 01/14/14 1624 01/14/14 1755   01/15/14 0400  ceFAZolin (ANCEF) IVPB 1 g/50 mL premix     1 g 100 mL/hr over 30 Minutes Intravenous 3 times per day 01/14/14 2031     01/14/14 1915  ceFAZolin (ANCEF) IVPB 1 g/50 mL premix     1 g 100 mL/hr over 30 Minutes Intravenous NOW 01/14/14 1908 01/14/14 1943   01/14/14  1910  ceFAZolin (ANCEF) 1-5 GM-% IVPB    Comments:  Veneta Penton   : cabinet override      01/14/14 1910 01/15/14 0714   01/14/14 1628  ceFAZolin (ANCEF) 2-3 GM-% IVPB SOLR    Comments:  Sammuel Cooper   : cabinet override      01/14/14 1628 01/15/14 0429    .  They were given sequential compression devices, early ambulation, and Other (comment)ambulation for DVT prophylaxis.  Recent vital signs: Patient Vitals for the past 24 hrs:  BP Temp Temp src Pulse Resp SpO2  01/15/14 0426 137/68 mmHg 98.4 F (36.9 C) - 75 17 93 %  01/15/14 0110 135/64 mmHg 98.4 F (36.9 C) - 71 17 94 %  01/14/14 2043 164/84 mmHg 98.4 F (36.9 C) - 78 17 92 %  01/14/14 2000 162/92 mmHg - - 80 17 91 %  01/14/14 1945 156/75 mmHg 97.7 F (36.5 C) - 72 15 93 %  01/14/14 1930 163/69 mmHg - - 79 11 97 %  01/14/14 1915 159/70 mmHg - - 78 12 97 %  01/14/14 1900 178/93 mmHg - - 82 15 99 %  01/14/14 1856 178/93 mmHg 98.1 F (36.7 C) - 96 16 93 %  01/14/14 1543 171/67 mmHg 97.6 F (36.4 C) Oral 67 16 96 %  01/14/14 1434 169/86 mmHg 98.4 F (36.9 C) Oral 69 14 98 %  01/14/14 1102 170/88 mmHg 98.3 F (36.8 C) - 88 18 98 %  .  Recent laboratory studies: Dg Chest 2  View  01/14/2014   CLINICAL DATA:  Preoperative examination (left wrist fracture). Initial encounter.  EXAM: CHEST  2 VIEW  COMPARISON:  03/12/2011  FINDINGS: Grossly unchanged enlarged cardiac silhouette with atherosclerotic plaque within the thoracic aorta. The lungs remain mildly hyperexpanded with mild diffuse slightly nodular thickening of the pulmonary interstitium. Evaluation of the retrosternal clear space obscured secondary to overlying soft tissues. Grossly unchanged bilateral infrahilar heterogeneous opacities favored to represent atelectasis. No new focal airspace opacities. No pleural effusion or pneumothorax. No evidence of edema. No acute osseus abnormalities.  IMPRESSION: Cardiomegaly, lung hyperexpansion and bronchitic change without acute  cardiopulmonary disease.   Electronically Signed   By: Sandi Mariscal M.D.   On: 01/14/2014 13:38   Dg Wrist Complete Left  01/14/2014   CLINICAL DATA:  Fall.  Wrist pain and deformity.  Swelling.  EXAM: LEFT WRIST - COMPLETE 3+ VIEW  COMPARISON:  None.  FINDINGS: Transverse mildly comminuted fracture of the distal radial metaphysis. Distal fragment is displaced laterally by 6 mm and posteriorly by 12 mm with apex anterior angulation. The fracture excellent extends into the distal radioulnar joint and there is potentially a nondisplaced longitudinal component extending into the distal radial articular surface although this is uncertain and mostly based on mild irregularity of the posterior radial rim on the frontal and oblique views.  Borderline widening of the scapholunate space.  IMPRESSION: 1. Displaced transverse fracture of the distal radial metaphysis. Possible nondisplaced linear extension to the distal radial articular surface. The fracture does extend into the radial side of the distal radioulnar joint. 2. Borderline widening of the scapholunate space potentially representing a tear of the scapholunate ligament.   Electronically Signed   By: Sherryl Barters M.D.   On: 01/14/2014 12:38    Discharge Medications:     Medication List         calcium carbonate 600 MG Tabs tablet  Commonly known as:  OS-CAL  Take 600 mg by mouth daily.     docusate sodium 100 MG capsule  Commonly known as:  COLACE  Take 1 capsule (100 mg total) by mouth 2 (two) times daily.     Hydrocodone-Acetaminophen 5-300 MG Tabs  Commonly known as:  VICODIN  Take 1 tablet by mouth 4 (four) times daily as needed (PAIN).     levothyroxine 100 MCG tablet  Commonly known as:  SYNTHROID, LEVOTHROID  Take 1 tablet (100 mcg total) by mouth daily before breakfast.     multivitamins ther. w/minerals Tabs tablet  Take 1 tablet by mouth daily.     vitamin C 500 MG tablet  Commonly known as:  ASCORBIC ACID  Take 1 tablet  (500 mg total) by mouth daily.        Diagnostic Studies: Dg Chest 2 View  01/14/2014   CLINICAL DATA:  Preoperative examination (left wrist fracture). Initial encounter.  EXAM: CHEST  2 VIEW  COMPARISON:  03/12/2011  FINDINGS: Grossly unchanged enlarged cardiac silhouette with atherosclerotic plaque within the thoracic aorta. The lungs remain mildly hyperexpanded with mild diffuse slightly nodular thickening of the pulmonary interstitium. Evaluation of the retrosternal clear space obscured secondary to overlying soft tissues. Grossly unchanged bilateral infrahilar heterogeneous opacities favored to represent atelectasis. No new focal airspace opacities. No pleural effusion or pneumothorax. No evidence of edema. No acute osseus abnormalities.  IMPRESSION: Cardiomegaly, lung hyperexpansion and bronchitic change without acute cardiopulmonary disease.   Electronically Signed   By: Sandi Mariscal M.D.   On: 01/14/2014 13:38  Dg Wrist Complete Left  01/14/2014   CLINICAL DATA:  Fall.  Wrist pain and deformity.  Swelling.  EXAM: LEFT WRIST - COMPLETE 3+ VIEW  COMPARISON:  None.  FINDINGS: Transverse mildly comminuted fracture of the distal radial metaphysis. Distal fragment is displaced laterally by 6 mm and posteriorly by 12 mm with apex anterior angulation. The fracture excellent extends into the distal radioulnar joint and there is potentially a nondisplaced longitudinal component extending into the distal radial articular surface although this is uncertain and mostly based on mild irregularity of the posterior radial rim on the frontal and oblique views.  Borderline widening of the scapholunate space.  IMPRESSION: 1. Displaced transverse fracture of the distal radial metaphysis. Possible nondisplaced linear extension to the distal radial articular surface. The fracture does extend into the radial side of the distal radioulnar joint. 2. Borderline widening of the scapholunate space potentially representing a  tear of the scapholunate ligament.   Electronically Signed   By: Sherryl Barters M.D.   On: 01/14/2014 12:38    They benefited maximally from their hospital stay and there were no complications.     Disposition: 01-Home or Self Care      Follow-up Information   Follow up with Linna Hoff, MD In 2 weeks.   Specialty:  Orthopedic Surgery   Contact information:   [address] [place] [address] [phone]        Signed: Linna Hoff 01/15/2014, 8:57 AM

## 2014-01-15 NOTE — Evaluation (Signed)
Physical Therapy Evaluation Patient Details Name: [name] [name] MRN: [id number] DOB: 01-05-1928 Today's Date: 01/15/2014   History of Present Illness  L distal radius fracture  Clinical Impression  Pt is supervision for all mobility, including gait and stairs.  Pt d/c'd home today.    Follow Up Recommendations Home health PT;Supervision for mobility/OOB    Equipment Recommendations  None recommended by PT    Recommendations for Other Services       Precautions / Restrictions Precautions Precautions: None Required Braces or Orthoses: Sling Restrictions Other Position/Activity Restrictions: NWB LUE      Mobility  Bed Mobility Overal bed mobility: Needs Assistance Bed Mobility: Supine to Sit     Supine to sit: Supervision        Transfers Overall transfer level: Needs assistance Equipment used: None Transfers: Sit to/from Stand;Stand Pivot Transfers Sit to Stand: Supervision Stand pivot transfers: Supervision          Ambulation/Gait Ambulation/Gait assistance: Supervision Ambulation Distance (Feet): 250 Feet Assistive device: None Gait Pattern/deviations: WFL(Within Functional Limits)   Gait velocity interpretation: at or above normal speed for age/gender    Stairs Stairs: Yes Stairs assistance: Supervision Stair Management: One rail Right Number of Stairs: 5    Wheelchair Mobility    Modified Rankin (Stroke Patients Only)       Balance Overall balance assessment: Modified Independent                                           Pertinent Vitals/Pain Pain Assessment: 0-10 Pain Score: 3  Pain Descriptors / Indicators: Sore Pain Intervention(s): Monitored during session    Home Living Family/patient expects to be discharged to:: Private residence Living Arrangements: Spouse/significant other Available Help at Discharge: Family Type of Home: Mobile home Home Access: Stairs to enter   Technical brewer of Steps:  4 Home Layout: One level Home Equipment: Shower seat      Prior Function Level of Independence: Independent               Hand Dominance   Dominant Hand: Right    Extremity/Trunk Assessment   Upper Extremity Assessment: LUE deficits/detail       LUE Deficits / Details: s/p surgery. Pt educated on AROM L shoulder and fingers but needs encouragemet             Communication   Communication: No difficulties  Cognition Arousal/Alertness: Awake/alert Behavior During Therapy: WFL for tasks assessed/performed Overall Cognitive Status: Within Functional Limits for tasks assessed                      General Comments      Exercises        Assessment/Plan    PT Assessment All further PT needs can be met in the next venue of care  PT Diagnosis Difficulty walking;Acute pain   PT Problem List Decreased mobility;Pain  PT Treatment Interventions     PT Goals (Current goals can be found in the Care Plan section) Acute Rehab PT Goals Patient Stated Goal: get home with home therapy and my husband PT Goal Formulation: No goals set, d/c therapy    Frequency     Barriers to discharge        Co-evaluation               End of Session Equipment Utilized During Treatment:  Gait belt;Other (comment) (sling) Activity Tolerance: Patient tolerated treatment well Patient left: in chair;with call bell/phone within reach;with family/visitor present      Functional Assessment Tool Used: clinical judgement Functional Limitation: Mobility: Walking and moving around Mobility: Walking and Moving Around Current Status ([id number]): At least 1 percent but less than 20 percent impaired, limited or restricted Mobility: Walking and Moving Around Goal Status [phone]): At least 1 percent but less than 20 percent impaired, limited or restricted Mobility: Walking and Moving Around Discharge Status [id number]): At least 1 percent but less than 20 percent impaired, limited or  restricted    Time: 1028-1053 PT Time Calculation (min): 25 min   Charges:   PT Evaluation $Initial PT Evaluation Tier I: 1 Procedure PT Treatments $Gait Training: 8-22 mins   PT G Codes:   Functional Assessment Tool Used: clinical judgement Functional Limitation: Mobility: Walking and moving around    Lorriane Shire 01/15/2014, 12:05 PM

## 2014-01-15 NOTE — Progress Notes (Signed)
Patients daughter Debbe Bales called to check on the patient. RN shared with daughter that patient slept fine throughout the night and that her pain levels were controlled as well. Daughter thanked Therapist, sports for nursing care.

## 2014-01-15 NOTE — Discharge Summary (Signed)
KEEP BANDAGE CLEAN AND DRY °CALL OFFICE FOR F/U APPT [phone] in 14 days °KEEP HAND ELEVATED ABOVE HEART °OK TO APPLY ICE TO OPERATIVE AREA °CONTACT OFFICE IF ANY WORSENING PAIN OR CONCERNS. °

## 2014-01-21 NOTE — ED Provider Notes (Signed)
Medical screening examination/treatment/procedure(s) were conducted as a shared visit with non-physician practitioner(s) or resident and myself. I personally evaluated the patient during the encounter and agree with the findings.  I have personally reviewed any xrays and/ or EKG's with the provider and I agree with interpretation.  Patient with mechanical fall and injured her left wrist. On exam patient has mild swelling and moderate tenderness to dorsal distal wrist with obvious deformity. Neurovascularly intact. Patient has flexion and extension with mild decreased range of extension. 2+ distal pulses in the left arm. Patient well-appearing otherwise. Ortho consult and plan for surgery.  Fall, distal radius fracture left       Mariea Clonts, MD 01/21/14 2156

## 2014-04-08 ENCOUNTER — Other Ambulatory Visit: Payer: Self-pay | Admitting: Endocrinology

## 2014-04-29 ENCOUNTER — Other Ambulatory Visit: Payer: Self-pay | Admitting: Endocrinology

## 2014-06-10 ENCOUNTER — Other Ambulatory Visit: Payer: Self-pay | Admitting: Endocrinology

## 2014-06-17 DIAGNOSIS — H903 Sensorineural hearing loss, bilateral: Secondary | ICD-10-CM | POA: Diagnosis not present

## 2014-06-17 DIAGNOSIS — H6123 Impacted cerumen, bilateral: Secondary | ICD-10-CM | POA: Diagnosis not present

## 2014-06-23 DIAGNOSIS — S52562D Barton's fracture of left radius, subsequent encounter for closed fracture with routine healing: Secondary | ICD-10-CM | POA: Diagnosis not present

## 2014-07-07 ENCOUNTER — Other Ambulatory Visit: Payer: Self-pay | Admitting: Endocrinology

## 2014-09-17 ENCOUNTER — Ambulatory Visit: Payer: Medicare Other | Admitting: Endocrinology

## 2014-09-18 ENCOUNTER — Encounter: Payer: Self-pay | Admitting: Endocrinology

## 2014-09-18 ENCOUNTER — Ambulatory Visit (INDEPENDENT_AMBULATORY_CARE_PROVIDER_SITE_OTHER): Payer: Medicare Other | Admitting: Endocrinology

## 2014-09-18 VITALS — BP 138/70 | HR 86 | Temp 97.6°F | Wt 152.0 lb

## 2014-09-18 DIAGNOSIS — E890 Postprocedural hypothyroidism: Secondary | ICD-10-CM | POA: Diagnosis not present

## 2014-09-18 DIAGNOSIS — E209 Hypoparathyroidism, unspecified: Secondary | ICD-10-CM

## 2014-09-18 DIAGNOSIS — C73 Malignant neoplasm of thyroid gland: Secondary | ICD-10-CM | POA: Diagnosis not present

## 2014-09-18 LAB — VITAMIN D 25 HYDROXY (VIT D DEFICIENCY, FRACTURES): VITD: 33.54 ng/mL (ref 30.00–100.00)

## 2014-09-18 LAB — TSH: TSH: 0.41 u[IU]/mL (ref 0.35–4.50)

## 2014-09-18 NOTE — Progress Notes (Signed)
Subjective:    Patient ID: [name] [name], female    DOB: 1927/06/05, 81 y.o.   MRN: [id number]  HPI Stage-2 follicular adenocarcinoma of the thyroid.  Pt states few weeks of slight swelling at the right anterior neck, but no assoc pain.  5/07:  right lobectomy--path shows 3 cm right lobe follicular ca (T2 N0 M0). 8/07:  completion left lobectomy, no more tumor seen 9/07  thyroglobulin 11.8 (ab neg) 11/07: i-131 rx, 101 mci 6/08 thyroglogulin 0.2 (ab neg) 6/08: hypothyroid body scan neg (clinically tolerated hypothyroidism poorly) 6/09 tg undetectable (ab=1.5, normal) 12/10: tg undetectable (ab neg) 6/11:  tg undetectable (ab neg) 6/12: tg undetectable (ab neg) 6/13: tg undetectable (ab neg) 6/15: tg undetectable (ab neg) Postsurgical hypothyroidism:  Denies weight change.   Postsurgical hypoparathyroidism:  She has slight leg cramps.  She has not needed rx for this.   Past Medical History  Diagnosis Date  . Malignant neoplasm of thyroid gland 03/28/2007    5/07: righte lobectomy--path shows 3 cm follicular ca 0/25: completion left lobectomy, no more tumor seen 09/07: thyroglobulin 0.2 9ab neg) 11/07: i-131 rx, 101 mci 6/08: thyroglogulin 0.2 (ab neg) , hypothyroid body scan neg (clinically tolerated hypothyroidism poorly) 06/09: tg undetectable (ab=1.5, normal) 12/10: tg undetectable (ab neg) 6/11: tg undectable (ab neg)  . HYPOTHYROIDISM, POSTSURGICAL 03/28/2007  . HYPERLIPIDEMIA 11/04/2006  . ALLERGIC RHINITIS 11/02/2006  . OSTEOPENIA 11/02/2006    Past Surgical History  Procedure Laterality Date  . Abdominal hysterectomy    . Cervical polypectomy    . Open reduction internal fixation (orif) distal radial fracture Left 01/14/2014    Procedure: OPEN REDUCTION INTERNAL FIXATION DISTAL RADIAL FRACTURE;  Surgeon: Linna Hoff, MD;  Location: [place];  Service: Orthopedics;  Laterality: Left;    History   Social History  . Marital Status: Married    Spouse Name: N/A  . Number  of Children: N/A  . Years of Education: N/A   Occupational History  . Not on file.   Social History Main Topics  . Smoking status: Never Smoker   . Smokeless tobacco: Not on file  . Alcohol Use: Not on file  . Drug Use: Not on file  . Sexual Activity: Not on file   Other Topics Concern  . Not on file   Social History Narrative   Doesn't drive    Current Outpatient Prescriptions on File Prior to Visit  Medication Sig Dispense Refill  . calcium carbonate (OS-CAL) 600 MG TABS Take 600 mg by mouth daily.     Marland Kitchen docusate sodium (COLACE) 100 MG capsule Take 1 capsule (100 mg total) by mouth 2 (two) times daily. 10 capsule 0  . Hydrocodone-Acetaminophen (VICODIN) 5-300 MG TABS Take 1 tablet by mouth 4 (four) times daily as needed (PAIN). 40 each 0  . levothyroxine (SYNTHROID, LEVOTHROID) 100 MCG tablet TAKE ONE TABLET BY MOUTH ONCE DAILY BEFORE BREAKFAST 30 tablet 3  . Multiple Vitamins-Minerals (MULTIVITAMINS THER. W/MINERALS) TABS Take 1 tablet by mouth daily.      . vitamin C (ASCORBIC ACID) 500 MG tablet Take 1 tablet (500 mg total) by mouth daily. 50 tablet 0   No current facility-administered medications on file prior to visit.    No Known Allergies  Family History  Problem Relation Age of Onset  . Diabetes Sister     BP 138/70 mmHg  Pulse 86  Temp(Src) 97.6 F (36.4 C) (Oral)  Wt 152 lb (68.947 kg)  SpO2 96%  Review  of Systems Denies depression and tremor    Objective:   Physical Exam VITAL SIGNS:  See vs page GENERAL: no distress Neck: a healed scar is present.  i do not appreciate a nodule in the thyroid or elsewhere in the neck.    Lab Results  Component Value Date   TSH 0.41 09/18/2014       Assessment & Plan:  Differentiated thyroid cancer, no clinical evidence of recurrence.   Postsurgical hypothyroidism, well-replaced.  Please continue the same medication Postsurgical hypoparathyroidism: he has not recently required rx.  Patient is advised the  following: Patient Instructions  blood tests are being requested for you today.  We'll contact you with results. Let's also check an ultrasound.  you will receive a phone call, about a day and time for an appointment Please return in 1 year.

## 2014-09-18 NOTE — Patient Instructions (Addendum)
blood tests are being requested for you today.  We'll contact you with results. Let's also check an ultrasound.  you will receive a phone call, about a day and time for an appointment Please return in 1 year.

## 2014-09-19 LAB — THYROGLOBULIN LEVEL: Thyroglobulin: <0.1 ng/mL — ABNORMAL LOW (ref 2.8–40.9)

## 2014-09-19 LAB — THYROGLOBULIN ANTIBODY: Thyroglobulin Ab: <1 IU/mL (ref <2)

## 2014-09-21 ENCOUNTER — Other Ambulatory Visit: Payer: Self-pay | Admitting: Endocrinology

## 2014-09-21 LAB — PTH, INTACT AND CALCIUM
Calcium: 7.9 mg/dL — ABNORMAL LOW (ref 8.4–10.5)
PTH: 22 pg/mL (ref 14–64)

## 2014-09-21 MED ORDER — CALCITRIOL 0.25 MCG PO CAPS: 0.2500 ug | ORAL_CAPSULE | ORAL | Status: DC

## 2014-11-03 ENCOUNTER — Other Ambulatory Visit: Payer: Self-pay | Admitting: Endocrinology

## 2014-11-19 ENCOUNTER — Ambulatory Visit (INDEPENDENT_AMBULATORY_CARE_PROVIDER_SITE_OTHER): Payer: Medicare Other | Admitting: Endocrinology

## 2014-11-19 ENCOUNTER — Encounter: Payer: Self-pay | Admitting: Endocrinology

## 2014-11-19 VITALS — BP 137/82 | HR 81 | Temp 97.7°F | Wt 153.0 lb

## 2014-11-19 DIAGNOSIS — E209 Hypoparathyroidism, unspecified: Secondary | ICD-10-CM | POA: Diagnosis not present

## 2014-11-19 DIAGNOSIS — G629 Polyneuropathy, unspecified: Secondary | ICD-10-CM | POA: Insufficient documentation

## 2014-11-19 DIAGNOSIS — R200 Anesthesia of skin: Secondary | ICD-10-CM | POA: Insufficient documentation

## 2014-11-19 DIAGNOSIS — E890 Postprocedural hypothyroidism: Secondary | ICD-10-CM | POA: Diagnosis not present

## 2014-11-19 LAB — TSH: TSH: 0.35 u[IU]/mL (ref 0.35–4.50)

## 2014-11-19 LAB — MAGNESIUM: Magnesium: 2.1 mg/dL (ref 1.5–2.5)

## 2014-11-19 LAB — VITAMIN B12: Vitamin B-12: 493 pg/mL (ref 211–911)

## 2014-11-19 LAB — PHOSPHORUS: Phosphorus: 3.4 mg/dL (ref 2.3–4.6)

## 2014-11-19 NOTE — Patient Instructions (Signed)
blood tests are being requested for you today.  We'll contact you with results. Please come back for a follow-up appointment in 6 months  

## 2014-11-19 NOTE — Progress Notes (Signed)
Subjective:    Patient ID: [name] [name], female    DOB: Nov 05, 1927, 81 y.o.   MRN: [id number]  HPI Stage-2 follicular adenocarcinoma of the thyroid.    5/07:  right lobectomy--path shows 3 cm right lobe follicular ca (T2 N0 M0). 8/07: completion left lobectomy, no more tumor seen.   9/07  thyroglobulin 11.8 (ab neg) 11/07: i-131 rx, 101 mci 6/08 thyroglogulin 0.2 (ab neg) 6/08: hypothyroid body scan neg (she tolerated thyroid hormone withdrawal poorly) 6/09 tg undetectable (ab=1.5, normal) 12/10: tg undetectable (ab neg) 6/11:  tg undetectable (ab neg) 6/12: tg undetectable (ab neg) 6/13: tg undetectable (ab neg) 6/15: tg undetectable (ab neg) 6/16: tg undetectable (ab neg) She denies any nodule at the neck Postsurgical hypothyroidism:  Denies weight change.   Postsurgical hypoparathyroidism:  Since on the rocaltrol, leg cramps have resolved.   Past Medical History  Diagnosis Date  . Malignant neoplasm of thyroid gland 03/28/2007    5/07: righte lobectomy--path shows 3 cm follicular ca 9/67: completion left lobectomy, no more tumor seen 09/07: thyroglobulin 0.2 9ab neg) 11/07: i-131 rx, 101 mci 6/08: thyroglogulin 0.2 (ab neg) , hypothyroid body scan neg (clinically tolerated hypothyroidism poorly) 06/09: tg undetectable (ab=1.5, normal) 12/10: tg undetectable (ab neg) 6/11: tg undectable (ab neg)  . HYPOTHYROIDISM, POSTSURGICAL 03/28/2007  . HYPERLIPIDEMIA 11/04/2006  . ALLERGIC RHINITIS 11/02/2006  . OSTEOPENIA 11/02/2006    Past Surgical History  Procedure Laterality Date  . Abdominal hysterectomy    . Cervical polypectomy    . Open reduction internal fixation (orif) distal radial fracture Left 01/14/2014    Procedure: OPEN REDUCTION INTERNAL FIXATION DISTAL RADIAL FRACTURE;  Surgeon: Linna Hoff, MD;  Location: [place];  Service: Orthopedics;  Laterality: Left;    Social History   Social History  . Marital Status: Married    Spouse Name: N/A  . Number of Children:  N/A  . Years of Education: N/A   Occupational History  . Not on file.   Social History Main Topics  . Smoking status: Never Smoker   . Smokeless tobacco: Not on file  . Alcohol Use: Not on file  . Drug Use: Not on file  . Sexual Activity: Not on file   Other Topics Concern  . Not on file   Social History Narrative   Doesn't drive    Current Outpatient Prescriptions on File Prior to Visit  Medication Sig Dispense Refill  . calcitRIOL (ROCALTROL) 0.25 MCG capsule Take 1 capsule (0.25 mcg total) by mouth 3 (three) times a week. 13 capsule 5  . calcium carbonate (OS-CAL) 600 MG TABS Take 600 mg by mouth daily.     Marland Kitchen docusate sodium (COLACE) 100 MG capsule Take 1 capsule (100 mg total) by mouth 2 (two) times daily. 10 capsule 0  . Hydrocodone-Acetaminophen (VICODIN) 5-300 MG TABS Take 1 tablet by mouth 4 (four) times daily as needed (PAIN). 40 each 0  . levothyroxine (SYNTHROID, LEVOTHROID) 100 MCG tablet TAKE ONE TABLET BY MOUTH ONCE DAILY BEFORE  BREAKFAST 30 tablet 0  . Multiple Vitamins-Minerals (MULTIVITAMINS THER. W/MINERALS) TABS Take 1 tablet by mouth daily.      . vitamin C (ASCORBIC ACID) 500 MG tablet Take 1 tablet (500 mg total) by mouth daily. 50 tablet 0   No current facility-administered medications on file prior to visit.    No Known Allergies  Family History  Problem Relation Age of Onset  . Diabetes Sister     BP 137/82 mmHg  Pulse 81  Temp(Src) 97.7 F (36.5 C) (Oral)  Wt 153 lb (69.4 kg)  SpO2 91%  Review of Systems She has slight numbness of the hands.  Denies edema    Objective:   Physical Exam VITAL SIGNS:  See vs page GENERAL: no distress Neck: a healed scar is present.  i do not appreciate a nodule in the thyroid or elsewhere in the neck   Lab Results  Component Value Date   TSH 0.35 11/19/2014   Lab Results  Component Value Date   PTH 18 11/19/2014   CALCIUM 8.2* 11/19/2014   PHOS 3.4 11/19/2014      Assessment & Plan:    Postsurgical hypoparathyroidism, improved on rocaltrol. Please continue the same medication Postsurgical hypothyroidism, well-replaced.  Please continue the same medication. Differentiated thyroid cancer: no clinical evidence of recurrence.  We;ll follow  Patient is advised the following: Patient Instructions  blood tests are being requested for you today.  We'll contact you with results. Please come back for a follow-up appointment in 6 months.

## 2014-11-20 LAB — PTH, INTACT AND CALCIUM
Calcium: 8.2 mg/dL — ABNORMAL LOW (ref 8.4–10.5)
PTH: 18 pg/mL (ref 14–64)

## 2014-12-01 ENCOUNTER — Telehealth: Payer: Self-pay | Admitting: Internal Medicine

## 2014-12-02 ENCOUNTER — Encounter: Payer: Self-pay | Admitting: Internal Medicine

## 2014-12-02 ENCOUNTER — Ambulatory Visit (INDEPENDENT_AMBULATORY_CARE_PROVIDER_SITE_OTHER): Payer: Medicare Other | Admitting: Internal Medicine

## 2014-12-02 VITALS — BP 150/84 | HR 91 | Temp 98.1°F | Resp 20 | Ht 64.0 in | Wt 150.0 lb

## 2014-12-02 DIAGNOSIS — L0291 Cutaneous abscess, unspecified: Secondary | ICD-10-CM | POA: Diagnosis not present

## 2014-12-02 DIAGNOSIS — L0390 Cellulitis, unspecified: Secondary | ICD-10-CM | POA: Diagnosis not present

## 2014-12-02 DIAGNOSIS — E890 Postprocedural hypothyroidism: Secondary | ICD-10-CM

## 2014-12-02 MED ORDER — DOXYCYCLINE HYCLATE 100 MG PO CAPS: 100.0000 mg | ORAL_CAPSULE | Freq: Two times a day (BID) | ORAL | Status: DC

## 2014-12-02 NOTE — Progress Notes (Signed)
Subjective:    Patient ID: [name] [name], female    DOB: May 17, 1927, 81 y.o.   MRN: [id number]  HPI  81 year old patient who is seen today complaining of some lesions of the left buttock area.  She has remote history of shingles and was concerned about recurrent shingles.  She has been seen in the past for treatment of cellulitis with small abscesses of the buttock area.  She has been applying topical antibiotics with some improvement   She is followed by endocrine due to surgical hypothyroidism  Past Medical History  Diagnosis Date  . Malignant neoplasm of thyroid gland 03/28/2007    5/07: righte lobectomy--path shows 3 cm follicular ca 2/77: completion left lobectomy, no more tumor seen 09/07: thyroglobulin 0.2 9ab neg) 11/07: i-131 rx, 101 mci 6/08: thyroglogulin 0.2 (ab neg) , hypothyroid body scan neg (clinically tolerated hypothyroidism poorly) 06/09: tg undetectable (ab=1.5, normal) 12/10: tg undetectable (ab neg) 6/11: tg undectable (ab neg)  . HYPOTHYROIDISM, POSTSURGICAL 03/28/2007  . HYPERLIPIDEMIA 11/04/2006  . ALLERGIC RHINITIS 11/02/2006  . OSTEOPENIA 11/02/2006    Social History   Social History  . Marital Status: Married    Spouse Name: N/A  . Number of Children: N/A  . Years of Education: N/A   Occupational History  . Not on file.   Social History Main Topics  . Smoking status: Never Smoker   . Smokeless tobacco: Not on file  . Alcohol Use: Not on file  . Drug Use: Not on file  . Sexual Activity: Not on file   Other Topics Concern  . Not on file   Social History Narrative   Doesn't drive    Past Surgical History  Procedure Laterality Date  . Abdominal hysterectomy    . Cervical polypectomy    . Open reduction internal fixation (orif) distal radial fracture Left 01/14/2014    Procedure: OPEN REDUCTION INTERNAL FIXATION DISTAL RADIAL FRACTURE;  Surgeon: Linna Hoff, MD;  Location: [place];  Service: Orthopedics;  Laterality: Left;    Family History   Problem Relation Age of Onset  . Diabetes Sister     No Known Allergies  Current Outpatient Prescriptions on File Prior to Visit  Medication Sig Dispense Refill  . calcitRIOL (ROCALTROL) 0.25 MCG capsule Take 1 capsule (0.25 mcg total) by mouth 3 (three) times a week. 13 capsule 5  . calcium carbonate (OS-CAL) 600 MG TABS Take 600 mg by mouth daily.     . cetirizine (ZYRTEC) 10 MG tablet Take 10 mg by mouth daily.    Marland Kitchen docusate sodium (COLACE) 100 MG capsule Take 1 capsule (100 mg total) by mouth 2 (two) times daily. 10 capsule 0  . levothyroxine (SYNTHROID, LEVOTHROID) 100 MCG tablet TAKE ONE TABLET BY MOUTH ONCE DAILY BEFORE  BREAKFAST 30 tablet 0  . Multiple Vitamins-Minerals (MULTIVITAMINS THER. W/MINERALS) TABS Take 1 tablet by mouth daily.      . vitamin C (ASCORBIC ACID) 500 MG tablet Take 1 tablet (500 mg total) by mouth daily. 50 tablet 0   No current facility-administered medications on file prior to visit.    BP 150/84 mmHg  Pulse 91  Temp(Src) 98.1 F (36.7 C) (Oral)  Resp 20  Ht 5\' 4"  (1.626 m)  Wt 150 lb (68.04 kg)  BMI 25.73 kg/m2  SpO2 97%     Review of Systems  Constitutional: Negative.   HENT: Negative for congestion, dental problem, hearing loss, rhinorrhea, sinus pressure, sore throat and tinnitus.   Eyes:  Negative for pain, discharge and visual disturbance.  Respiratory: Negative for cough and shortness of breath.   Cardiovascular: Negative for chest pain, palpitations and leg swelling.  Gastrointestinal: Negative for nausea, vomiting, abdominal pain, diarrhea, constipation, blood in stool and abdominal distention.  Genitourinary: Negative for dysuria, urgency, frequency, hematuria, flank pain, vaginal bleeding, vaginal discharge, difficulty urinating, vaginal pain and pelvic pain.  Musculoskeletal: Negative for joint swelling, arthralgias and gait problem.  Skin: Positive for wound. Negative for rash.  Neurological: Negative for dizziness, syncope,  speech difficulty, weakness, numbness and headaches.  Hematological: Negative for adenopathy.  Psychiatric/Behavioral: Negative for behavioral problems, dysphoric mood and agitation. The patient is not nervous/anxious.        Objective:   Physical Exam  Neck:  Status post thyroidectomy scar.  No lesions  Skin:  2.  Inflammatory nodules involving the left buttock 1 and 1.5 centimeters in diameter.  No fluctuance          Assessment & Plan:   Skin and soft tissue infection left buttock area.  Local wound care discussed.  Will treat with doxycycline for 7 days Surgical hypothyroidism.  Recent TSH normal  CPX one year

## 2014-12-02 NOTE — Progress Notes (Signed)
Pre visit review using our clinic review tool, if applicable. No additional management support is needed unless otherwise documented below in the visit note. 

## 2014-12-02 NOTE — Patient Instructions (Addendum)
Take your antibiotic as prescribed until ALL of it is gone, but stop if you develop a rash, swelling, or any side effects of the medication.  Contact our office as soon as possible if  there are side effects of the medication.Abscess An abscess (boil or furuncle) is an infected area on or under the skin. This area is filled with yellowish-white fluid (pus) and other material (debris). HOME CARE   Only take medicines as told by your doctor.  If you were given antibiotic medicine, take it as directed. Finish the medicine even if you start to feel better.  If gauze is used, follow your doctor's directions for changing the gauze.  To avoid spreading the infection:  Keep your abscess covered with a bandage.  Wash your hands well.  Do not share personal care items, towels, or whirlpools with others.  Avoid skin contact with others.  Keep your skin and clothes clean around the abscess.  Keep all doctor visits as told. GET HELP RIGHT AWAY IF:   You have more pain, puffiness (swelling), or redness in the wound site.  You have more fluid or blood coming from the wound site.  You have muscle aches, chills, or you feel sick.  You have a fever. MAKE SURE YOU:   Understand these instructions.  Will watch your condition.  Will get help right away if you are not doing well or get worse. Document Released: 09/13/2007 Document Revised: 09/26/2011 Document Reviewed: 06/09/2011 [facility] Patient Information 2015 [place], [place]. This information is not intended to replace advice given to you by your health care provider. Make sure you discuss any questions you have with your health care provider.

## 2014-12-04 ENCOUNTER — Other Ambulatory Visit: Payer: Self-pay | Admitting: Endocrinology

## 2015-01-04 ENCOUNTER — Other Ambulatory Visit: Payer: Self-pay | Admitting: Endocrinology

## 2015-01-18 ENCOUNTER — Ambulatory Visit (INDEPENDENT_AMBULATORY_CARE_PROVIDER_SITE_OTHER): Payer: Medicare Other | Admitting: *Deleted

## 2015-01-18 DIAGNOSIS — Z23 Encounter for immunization: Secondary | ICD-10-CM

## 2015-02-03 ENCOUNTER — Other Ambulatory Visit: Payer: Self-pay | Admitting: Endocrinology

## 2015-02-23 NOTE — Telephone Encounter (Signed)
error 

## 2015-03-06 ENCOUNTER — Other Ambulatory Visit: Payer: Self-pay | Admitting: Endocrinology

## 2015-04-05 ENCOUNTER — Other Ambulatory Visit: Payer: Self-pay | Admitting: Endocrinology

## 2015-05-20 DIAGNOSIS — H6123 Impacted cerumen, bilateral: Secondary | ICD-10-CM | POA: Diagnosis not present

## 2015-05-20 DIAGNOSIS — H903 Sensorineural hearing loss, bilateral: Secondary | ICD-10-CM | POA: Diagnosis not present

## 2015-05-21 ENCOUNTER — Ambulatory Visit (INDEPENDENT_AMBULATORY_CARE_PROVIDER_SITE_OTHER): Payer: Medicare Other | Admitting: Endocrinology

## 2015-05-21 VITALS — BP 124/84 | HR 76 | Temp 97.9°F | Wt 153.0 lb

## 2015-05-21 DIAGNOSIS — E890 Postprocedural hypothyroidism: Secondary | ICD-10-CM | POA: Diagnosis not present

## 2015-05-21 DIAGNOSIS — E208 Other hypoparathyroidism: Secondary | ICD-10-CM

## 2015-05-21 LAB — TSH: TSH: 0.21 u[IU]/mL — ABNORMAL LOW (ref 0.35–4.50)

## 2015-05-21 MED ORDER — LEVOTHYROXINE SODIUM 88 MCG PO TABS: 88.0000 ug | ORAL_TABLET | Freq: Every day | ORAL | Status: DC

## 2015-05-21 NOTE — Progress Notes (Signed)
Subjective:    Patient ID: [name] [name], female    DOB: [birth year], 82 y.o.   MRN: [id number]  HPI  The state of at least three ongoing medical problems is addressed today, with interval history of each noted here: Stage-2 follicular adenocarcinoma of the thyroid.    5/07:  right lobectomy--path shows 3 cm right lobe follicular ca (T2 N0 M0). 8/07: completion left lobectomy, no more tumor seen.   9/07  thyroglobulin 11.8 (ab neg) 11/07: i-131 rx, 101 mci 6/08 thyroglogulin 0.2 (ab neg) 6/08: hypothyroid body scan neg (she tolerated thyroid hormone withdrawal poorly) 6/09 tg undetectable (ab=1.5, normal) 12/10: tg undetectable (ab neg) 6/11:  tg undetectable (ab neg) 6/12: tg undetectable (ab neg) 6/13: tg undetectable (ab neg) 6/15: tg undetectable (ab neg) 6/16: tg undetectable (ab neg) She denies any nodule at the neck Postsurgical hypothyroidism:  She has cold intolerance Postsurgical hypoparathyroidism:  Denies leg cramps Past Medical History  Diagnosis Date  . Malignant neoplasm of thyroid gland 03/28/2007    5/07: righte lobectomy--path shows 3 cm follicular ca [id number]: completion left lobectomy, no more tumor seen 09/07: thyroglobulin 0.2 9ab neg) 11/07: i-131 rx, 101 mci 6/08: thyroglogulin 0.2 (ab neg) , hypothyroid body scan neg (clinically tolerated hypothyroidism poorly) 06/09: tg undetectable (ab=1.5, normal) 12/10: tg undetectable (ab neg) 6/11: tg undectable (ab neg)  . HYPOTHYROIDISM, POSTSURGICAL 03/28/2007  . HYPERLIPIDEMIA 11/04/2006  . ALLERGIC RHINITIS 11/02/2006  . OSTEOPENIA 11/02/2006    Past Surgical History  Procedure Laterality Date  . Abdominal hysterectomy    . Cervical polypectomy    . Open reduction internal fixation (orif) distal radial fracture Left 01/14/2014    Procedure: OPEN REDUCTION INTERNAL FIXATION DISTAL RADIAL FRACTURE;  Surgeon: Linna Hoff, MD;  Location: [place];  Service: Orthopedics;  Laterality: Left;    Social History   Social  History  . Marital Status: Married    Spouse Name: N/A  . Number of Children: N/A  . Years of Education: N/A   Occupational History  . Not on file.   Social History Main Topics  . Smoking status: Never Smoker   . Smokeless tobacco: Not on file  . Alcohol Use: Not on file  . Drug Use: Not on file  . Sexual Activity: Not on file   Other Topics Concern  . Not on file   Social History Narrative   Doesn't drive    Current Outpatient Prescriptions on File Prior to Visit  Medication Sig Dispense Refill  . calcitRIOL (ROCALTROL) 0.25 MCG capsule Take 1 capsule (0.25 mcg total) by mouth 3 (three) times a week. 13 capsule 5  . calcium carbonate (OS-CAL) 600 MG TABS Take 600 mg by mouth daily.     . cetirizine (ZYRTEC) 10 MG tablet Take 10 mg by mouth daily.    . Multiple Vitamins-Minerals (MULTIVITAMINS THER. W/MINERALS) TABS Take 1 tablet by mouth daily.      . vitamin C (ASCORBIC ACID) 500 MG tablet Take 1 tablet (500 mg total) by mouth daily. 50 tablet 0   No current facility-administered medications on file prior to visit.    No Known Allergies  Family History  Problem Relation Age of Onset  . Diabetes Sister     BP 124/84 mmHg  Pulse 76  Temp(Src) 97.9 F (36.6 C) (Oral)  Wt 153 lb (69.4 kg)  SpO2 95%  Review of Systems Denies weight change and fatigue.      Objective:   Physical Exam VITAL SIGNS:  See vs page GENERAL: no distress Neck: a healed scar is present.  i do not appreciate a nodule in the thyroid or elsewhere in the neck  Lab Results  Component Value Date   TSH 0.21* 05/21/2015      Assessment & Plan:  Hypothyroidism, slightly overreplaced Differentiated thyroid cancer: no clinical evidence of recurrence Postsurgical hypoparathyroidism: due for recheck.   Patient is advised the following: Patient Instructions  blood tests are being requested for you today.  We'll contact you with results. Please come back for a follow-up appointment in 6  months.    addendum: i have sent a prescription to your pharmacy, to reduce synthroid.

## 2015-05-21 NOTE — Patient Instructions (Signed)
blood tests are being requested for you today.  We'll contact you with results. Please come back for a follow-up appointment in 6 months  

## 2015-05-24 LAB — PTH, INTACT AND CALCIUM
Calcium: 8.5 mg/dL (ref 8.4–10.5)
PTH: 20 pg/mL (ref 14–64)

## 2015-06-16 ENCOUNTER — Other Ambulatory Visit: Payer: Self-pay | Admitting: Endocrinology

## 2015-08-23 DIAGNOSIS — H903 Sensorineural hearing loss, bilateral: Secondary | ICD-10-CM | POA: Diagnosis not present

## 2015-08-23 DIAGNOSIS — H6123 Impacted cerumen, bilateral: Secondary | ICD-10-CM | POA: Diagnosis not present

## 2015-09-02 ENCOUNTER — Other Ambulatory Visit: Payer: Self-pay | Admitting: Endocrinology

## 2015-10-14 ENCOUNTER — Ambulatory Visit (INDEPENDENT_AMBULATORY_CARE_PROVIDER_SITE_OTHER): Payer: Medicare Other | Admitting: Family Medicine

## 2015-10-14 ENCOUNTER — Encounter: Payer: Self-pay | Admitting: Family Medicine

## 2015-10-14 VITALS — BP 120/74 | HR 94 | Temp 97.9°F | Ht 64.0 in | Wt 152.8 lb

## 2015-10-14 DIAGNOSIS — L03317 Cellulitis of buttock: Secondary | ICD-10-CM | POA: Diagnosis not present

## 2015-10-14 MED ORDER — DOXYCYCLINE HYCLATE 100 MG PO TABS: 100.0000 mg | ORAL_TABLET | Freq: Two times a day (BID) | ORAL | Status: DC

## 2015-10-14 NOTE — Progress Notes (Signed)
Pre visit review using our clinic review tool, if applicable. No additional management support is needed unless otherwise documented below in the visit note. 

## 2015-10-14 NOTE — Progress Notes (Signed)
Subjective:    Patient ID: [name] [name], female    DOB: February 25, 1928, 82 y.o.   MRN: [id number]  HPI  [name] is an 82 year old female who presents today with "sore on her bottom" that she noticed 6 days ago when sitting in a chair. Associated symptoms of erythema and warmth have been present. She denies fever, chills, sweats, recent trigger or injury. She does report a bee sting on her leg that occurred just prior to this episode that resolved without difficulty.  She has not noticed a pustule or drainage at this time. Treatment at home includes warm epsom salt soaks and warm washcloth compresses that have provided some benefit. She reports a history of cellulitis in the past that was treated successfully with oral antibiotic therapy.  Review of Systems  Constitutional: Negative for fever and chills.  Eyes: Negative for visual disturbance.  Respiratory: Negative for cough, shortness of breath and wheezing.   Cardiovascular: Negative for chest pain and palpitations.  Gastrointestinal: Negative for nausea, vomiting, abdominal pain, diarrhea and constipation.  Genitourinary: Negative for dysuria and hematuria.  Skin:       Erythematous area on right buttock   Neurological: Negative for dizziness, light-headedness and headaches.  Psychiatric/Behavioral:       Denies depressed or anxious mood   Past Medical History  Diagnosis Date  . Malignant neoplasm of thyroid gland ([place]) 03/28/2007    5/07: righte lobectomy--path shows 3 cm follicular ca [id number]: completion left lobectomy, no more tumor seen 09/07: thyroglobulin 0.2 9ab neg) 11/07: i-131 rx, 101 mci 6/08: thyroglogulin 0.2 (ab neg) , hypothyroid body scan neg (clinically tolerated hypothyroidism poorly) 06/09: tg undetectable (ab=1.5, normal) 12/10: tg undetectable (ab neg) 6/11: tg undectable (ab neg)  . HYPOTHYROIDISM, POSTSURGICAL 03/28/2007  . HYPERLIPIDEMIA 11/04/2006  . ALLERGIC RHINITIS 11/02/2006  . OSTEOPENIA 11/02/2006       Social History   Social History  . Marital Status: Married    Spouse Name: N/A  . Number of Children: N/A  . Years of Education: N/A   Occupational History  . Not on file.   Social History Main Topics  . Smoking status: Never Smoker   . Smokeless tobacco: Not on file  . Alcohol Use: Not on file  . Drug Use: Not on file  . Sexual Activity: Not on file   Other Topics Concern  . Not on file   Social History Narrative   Doesn't drive    Past Surgical History  Procedure Laterality Date  . Abdominal hysterectomy    . Cervical polypectomy    . Open reduction internal fixation (orif) distal radial fracture Left 01/14/2014    Procedure: OPEN REDUCTION INTERNAL FIXATION DISTAL RADIAL FRACTURE;  Surgeon: Linna Hoff, MD;  Location: [facility];  Service: Orthopedics;  Laterality: Left;    Family History  Problem Relation Age of Onset  . Diabetes Sister     No Known Allergies  Current Outpatient Prescriptions on File Prior to Visit  Medication Sig Dispense Refill  . calcitRIOL (ROCALTROL) 0.25 MCG capsule TAKE ONE TABLET BY MOUTH THREE TIMES WEEKLY 13 capsule 0  . calcium carbonate (OS-CAL) 600 MG TABS Take 600 mg by mouth daily.     . cetirizine (ZYRTEC) 10 MG tablet Take 10 mg by mouth daily.    Marland Kitchen levothyroxine (SYNTHROID, LEVOTHROID) 88 MCG tablet Take 1 tablet (88 mcg total) by mouth daily before breakfast. 90 tablet 3  . Multiple Vitamins-Minerals (MULTIVITAMINS THER. W/MINERALS) TABS Take  1 tablet by mouth daily.      . vitamin C (ASCORBIC ACID) 500 MG tablet Take 1 tablet (500 mg total) by mouth daily. 50 tablet 0   No current facility-administered medications on file prior to visit.    BP 120/74 mmHg  Pulse 94  Temp(Src) 97.9 F (36.6 C) (Oral)  Ht 5\' 4"  (1.626 m)  Wt 152 lb 12.8 oz (69.31 kg)  BMI 26.22 kg/m2        Objective:   Physical Exam  Constitutional: She is oriented to person, place, and time. She appears well-developed and well-nourished.   Cardiovascular: Normal rate and regular rhythm.   Pulmonary/Chest: Effort normal and breath sounds normal. She has no wheezes. She has no rales.  Neurological: She is alert and oriented to person, place, and time. Coordination normal.  Skin: Skin is warm and dry.  Right lower buttock reveals a 4 x 5 cm area of erythema.  No pustule or fluctuance noted.  Psychiatric: She has a normal mood and affect. Her behavior is normal. Judgment and thought content normal.       Assessment & Plan:  1. Cellulitis of buttock History of cellulitis noted. Treat for 10 days. Advised patient to seek care if she develops increase swelling, erythema, tenderness,or develops a fever with treatment. Also advised her to monitor area for red streaks and if these are noticed, she has agreed to seek care at an urgent care setting if needed. Continue warm compresses   - doxycycline (VIBRA-TABS) 100 MG tablet; Take 1 tablet (100 mg total) by mouth 2 (two) times daily.  Dispense: 20 tablet; Refill: 0  Delano Metz, FNP-C

## 2015-10-14 NOTE — Patient Instructions (Signed)
Please take antibiotic as directed and seek care if you notice any increased redness, swelling, or you develop red streaks or a fever or you do not tolerate antibiotic.   Cellulitis Cellulitis is an infection of the skin and the tissue beneath it. The infected area is usually red and tender. Cellulitis occurs most often in the arms and lower legs.  CAUSES  Cellulitis is caused by bacteria that enter the skin through cracks or cuts in the skin. The most common types of bacteria that cause cellulitis are staphylococci and streptococci. SIGNS AND SYMPTOMS   Redness and warmth.  Swelling.  Tenderness or pain.  Fever. DIAGNOSIS  Your health care provider can usually determine what is wrong based on a physical exam. Blood tests may also be done. TREATMENT  Treatment usually involves taking an antibiotic medicine. HOME CARE INSTRUCTIONS   Take your antibiotic medicine as directed by your health care provider. Finish the antibiotic even if you start to feel better.  Keep the infected arm or leg elevated to reduce swelling.  Apply a warm cloth to the affected area up to 4 times per day to relieve pain.  Take medicines only as directed by your health care provider.  Keep all follow-up visits as directed by your health care provider. SEEK MEDICAL CARE IF:   You notice red streaks coming from the infected area.  Your red area gets larger or turns dark in color.  Your bone or joint underneath the infected area becomes painful after the skin has healed.  Your infection returns in the same area or another area.  You notice a swollen bump in the infected area.  You develop new symptoms.  You have a fever. SEEK IMMEDIATE MEDICAL CARE IF:   You feel very sleepy.  You develop vomiting or diarrhea.  You have a general ill feeling (malaise) with muscle aches and pains.   This information is not intended to replace advice given to you by your health care provider. Make sure you  discuss any questions you have with your health care provider.   Document Released: 01/04/2005 Document Revised: 12/16/2014 Document Reviewed: 06/12/2011 Elsevier Interactive Patient Education [employer].

## 2015-10-25 ENCOUNTER — Telehealth: Payer: Self-pay | Admitting: Internal Medicine

## 2015-10-25 NOTE — Telephone Encounter (Signed)
Pt would like you to know that the cyst is not sore or hurting any longer, but she is wondering if she needs to get a another antibiotic for the small spot that is still there.

## 2015-10-26 NOTE — Telephone Encounter (Signed)
Spoke with patient and relayed Dr. Marthann Schiller message. Patient thanked [place] and stated she would let us know if she has any further concerns.

## 2015-10-26 NOTE — Telephone Encounter (Signed)
Recommend observation without further antibiotics at this time but notify if increasing pain, swelling or redness

## 2015-10-26 NOTE — Telephone Encounter (Signed)
Left voicemail for patient to call the office back.   

## 2015-11-15 DIAGNOSIS — Z01419 Encounter for gynecological examination (general) (routine) without abnormal findings: Secondary | ICD-10-CM | POA: Diagnosis not present

## 2015-11-15 DIAGNOSIS — Z6827 Body mass index (BMI) 27.0-27.9, adult: Secondary | ICD-10-CM | POA: Diagnosis not present

## 2015-11-15 DIAGNOSIS — N390 Urinary tract infection, site not specified: Secondary | ICD-10-CM | POA: Diagnosis not present

## 2015-11-15 DIAGNOSIS — Z1231 Encounter for screening mammogram for malignant neoplasm of breast: Secondary | ICD-10-CM | POA: Diagnosis not present

## 2015-11-18 ENCOUNTER — Other Ambulatory Visit: Payer: Self-pay | Admitting: Family Medicine

## 2015-11-18 DIAGNOSIS — L03317 Cellulitis of buttock: Secondary | ICD-10-CM

## 2015-11-22 ENCOUNTER — Other Ambulatory Visit: Payer: Self-pay | Admitting: Endocrinology

## 2015-11-22 ENCOUNTER — Ambulatory Visit (INDEPENDENT_AMBULATORY_CARE_PROVIDER_SITE_OTHER): Payer: Medicare Other | Admitting: Endocrinology

## 2015-11-22 ENCOUNTER — Other Ambulatory Visit: Payer: Self-pay

## 2015-11-22 DIAGNOSIS — E208 Other hypoparathyroidism: Secondary | ICD-10-CM

## 2015-11-22 DIAGNOSIS — E890 Postprocedural hypothyroidism: Secondary | ICD-10-CM | POA: Diagnosis not present

## 2015-11-22 DIAGNOSIS — C73 Malignant neoplasm of thyroid gland: Secondary | ICD-10-CM | POA: Diagnosis not present

## 2015-11-22 LAB — TSH: TSH: 1.84 u[IU]/mL (ref 0.35–4.50)

## 2015-11-22 MED ORDER — CALCITRIOL 0.25 MCG PO CAPS: ORAL_CAPSULE | ORAL | 2 refills | Status: DC

## 2015-11-22 NOTE — Progress Notes (Signed)
Subjective:    Patient ID: [name] [name], female    DOB: 21-Apr-1927, 82 y.o.   MRN: [id number]  HPI The state of at least three ongoing medical problems is addressed today, with interval history of each noted here: Stage-2 follicular adenocarcinoma of the thyroid.    5/07:  right lobectomy--path shows 3 cm right lobe follicular ca (T2 N0 M0). 8/07: completion left lobectomy, no more tumor seen.   9/07  thyroglobulin 11.8 (ab neg) 11/07: i-131 rx, 101 mCi 6/08 thyroglogulin 0.2 (ab neg) 6/08: hypothyroid body scan neg (she tolerated thyroid hormone withdrawal poorly) 6/09 tg undetectable (ab=1.5) 12/10: tg undetectable (ab neg) 6/11:  tg undetectable (ab neg) 6/12: tg undetectable (ab neg) 6/13: tg undetectable (ab neg) 6/15: tg undetectable (ab neg) 6/16: tg undetectable (ab neg).   She denies any nodule at the neck.   Postsurgical hypothyroidism:  She denies cold intolerance.   Postsurgical hypoparathyroidism:  Denies leg cramps.   Past Medical History:  Diagnosis Date  . ALLERGIC RHINITIS 11/02/2006  . HYPERLIPIDEMIA 11/04/2006  . HYPOTHYROIDISM, POSTSURGICAL 03/28/2007  . Malignant neoplasm of thyroid gland ([place]) 03/28/2007   5/07: righte lobectomy--path shows 3 cm follicular ca [id number]: completion left lobectomy, no more tumor seen 09/07: thyroglobulin 0.2 9ab neg) 11/07: i-131 rx, 101 mci 6/08: thyroglogulin 0.2 (ab neg) , hypothyroid body scan neg (clinically tolerated hypothyroidism poorly) 06/09: tg undetectable (ab=1.5, normal) 12/10: tg undetectable (ab neg) 6/11: tg undectable (ab neg)  . OSTEOPENIA 11/02/2006    Past Surgical History:  Procedure Laterality Date  . ABDOMINAL HYSTERECTOMY    . CERVICAL POLYPECTOMY    . OPEN REDUCTION INTERNAL FIXATION (ORIF) DISTAL RADIAL FRACTURE Left 01/14/2014   Procedure: OPEN REDUCTION INTERNAL FIXATION DISTAL RADIAL FRACTURE;  Surgeon: Linna Hoff, MD;  Location: [place] [facility];  Service: Orthopedics;  Laterality: Left;    Social  History   Social History  . Marital status: Married    Spouse name: N/A  . Number of children: N/A  . Years of education: N/A   Occupational History  . Not on file.   Social History Main Topics  . Smoking status: Never Smoker  . Smokeless tobacco: Not on file  . Alcohol use Not on file  . Drug use: Unknown  . Sexual activity: Not on file   Other Topics Concern  . Not on file   Social History Narrative   Doesn't drive    Current Outpatient Prescriptions on File Prior to Visit  Medication Sig Dispense Refill  . calcium carbonate (OS-CAL) 600 MG TABS Take 600 mg by mouth daily.     . cetirizine (ZYRTEC) 10 MG tablet Take 10 mg by mouth daily.    Marland Kitchen levothyroxine (SYNTHROID, LEVOTHROID) 88 MCG tablet Take 1 tablet (88 mcg total) by mouth daily before breakfast. 90 tablet 3  . Multiple Vitamins-Minerals (MULTIVITAMINS THER. W/MINERALS) TABS Take 1 tablet by mouth daily.      . vitamin C (ASCORBIC ACID) 500 MG tablet Take 1 tablet (500 mg total) by mouth daily. 50 tablet 0  . doxycycline (VIBRA-TABS) 100 MG tablet Take 1 tablet (100 mg total) by mouth 2 (two) times daily. (Patient not taking: Reported on 11/22/2015) 20 tablet 0   No current facility-administered medications on file prior to visit.     No Known Allergies  Family History  Problem Relation Age of Onset  . Diabetes Sister     There were no vitals taken for this visit.  Review of Systems Denies  weight change and neck pain.      Objective:   Physical Exam VITAL SIGNS:  See vs page GENERAL: no distress Neck: a healed scar is present.  i do not appreciate a nodule in the thyroid or elsewhere in the neck.    Lab Results  Component Value Date   TSH 1.84 11/22/2015   Lab Results  Component Value Date   PTH 18 11/22/2015   CALCIUM 8.3 (L) 11/22/2015   PHOS 3.4 11/19/2014   TG=0.1    Assessment & Plan:  Hypothyroidism, well-controlled Differentiated thyroid cancer: no evidence of  recurrence Postsurgical hypoparathyroidism: well-controlled (goal Ca++ is 8-9)  Please continue the same medications

## 2015-11-22 NOTE — Progress Notes (Signed)
Pre visit review using our clinic tool,if applicable. No additional management support is needed unless otherwise documented below in the visit note.  

## 2015-11-22 NOTE — Patient Instructions (Signed)
blood tests are being requested for you today.  We'll contact you with results. Please come back for a follow-up appointment in 6 months  

## 2015-11-23 LAB — THYROGLOBULIN LEVEL: Thyroglobulin: 0.1 ng/mL — ABNORMAL LOW (ref 2.8–40.9)

## 2015-11-23 LAB — THYROGLOBULIN ANTIBODY: Thyroglobulin Ab: <1 IU/mL (ref <2)

## 2015-11-23 LAB — PTH, INTACT AND CALCIUM
CALCIUM: 8.3 mg/dL — AB (ref 8.4–10.5)
PTH: 18 pg/mL (ref 14–64)

## 2015-11-23 MED ORDER — CALCITRIOL 0.25 MCG PO CAPS: ORAL_CAPSULE | ORAL | 3 refills | Status: DC

## 2015-12-07 DIAGNOSIS — H6123 Impacted cerumen, bilateral: Secondary | ICD-10-CM | POA: Diagnosis not present

## 2015-12-07 DIAGNOSIS — H903 Sensorineural hearing loss, bilateral: Secondary | ICD-10-CM | POA: Diagnosis not present

## 2016-01-11 ENCOUNTER — Ambulatory Visit (INDEPENDENT_AMBULATORY_CARE_PROVIDER_SITE_OTHER): Payer: Medicare Other | Admitting: *Deleted

## 2016-01-11 DIAGNOSIS — Z23 Encounter for immunization: Secondary | ICD-10-CM

## 2016-02-21 DIAGNOSIS — H903 Sensorineural hearing loss, bilateral: Secondary | ICD-10-CM | POA: Diagnosis not present

## 2016-02-21 DIAGNOSIS — H6123 Impacted cerumen, bilateral: Secondary | ICD-10-CM | POA: Diagnosis not present

## 2016-04-21 ENCOUNTER — Other Ambulatory Visit: Payer: Self-pay | Admitting: Endocrinology

## 2016-05-03 DIAGNOSIS — H6123 Impacted cerumen, bilateral: Secondary | ICD-10-CM | POA: Diagnosis not present

## 2016-05-03 DIAGNOSIS — H903 Sensorineural hearing loss, bilateral: Secondary | ICD-10-CM | POA: Diagnosis not present

## 2016-05-08 ENCOUNTER — Ambulatory Visit (INDEPENDENT_AMBULATORY_CARE_PROVIDER_SITE_OTHER): Payer: PPO | Admitting: Internal Medicine

## 2016-05-08 ENCOUNTER — Encounter: Payer: Self-pay | Admitting: Internal Medicine

## 2016-05-08 VITALS — BP 158/80 | HR 91 | Temp 98.0°F | Ht 64.0 in | Wt 153.0 lb

## 2016-05-08 DIAGNOSIS — L0292 Furuncle, unspecified: Secondary | ICD-10-CM

## 2016-05-08 DIAGNOSIS — E890 Postprocedural hypothyroidism: Secondary | ICD-10-CM

## 2016-05-08 DIAGNOSIS — E785 Hyperlipidemia, unspecified: Secondary | ICD-10-CM

## 2016-05-08 MED ORDER — DOXYCYCLINE HYCLATE 100 MG PO TABS: 100.0000 mg | ORAL_TABLET | Freq: Two times a day (BID) | ORAL | 0 refills | Status: DC

## 2016-05-08 NOTE — Progress Notes (Signed)
Subjective:    Patient ID: [name] [name], female    DOB: [birth year], 83 y.o.   MRN: [id number]  HPI  83 year old patient who does quite well.  She is followed by endocrine for surgical hypothyroidism. 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  The left greater than the right.  No fever or constitutional complaints  Past Medical History:  Diagnosis Date  . ALLERGIC RHINITIS 11/02/2006  . HYPERLIPIDEMIA 11/04/2006  . HYPOTHYROIDISM, POSTSURGICAL 03/28/2007  . Malignant neoplasm of thyroid gland ([place]) 03/28/2007   5/07: righte lobectomy--path shows 3 cm follicular ca [id number]: completion left lobectomy, no more tumor seen 09/07: thyroglobulin 0.2 9ab neg) 11/07: i-131 rx, 101 mci 6/08: thyroglogulin 0.2 (ab neg) , hypothyroid body scan neg (clinically tolerated hypothyroidism poorly) 06/09: tg undetectable (ab=1.5, normal) 12/10: tg undetectable (ab neg) 6/11: tg undectable (ab neg)  . OSTEOPENIA 11/02/2006     Social History   Social History  . Marital status: Married    Spouse name: N/A  . Number of children: N/A  . Years of education: N/A   Occupational History  . Not on file.   Social History Main Topics  . Smoking status: Never Smoker  . Smokeless tobacco: Never Used  . Alcohol use Not on file  . Drug use: Unknown  . Sexual activity: Not on file   Other Topics Concern  . Not on file   Social History Narrative   Doesn't drive    Past Surgical History:  Procedure Laterality Date  . ABDOMINAL HYSTERECTOMY    . CERVICAL POLYPECTOMY    . OPEN REDUCTION INTERNAL FIXATION (ORIF) DISTAL RADIAL FRACTURE Left 01/14/2014   Procedure: OPEN REDUCTION INTERNAL FIXATION DISTAL RADIAL FRACTURE;  Surgeon: Linna Hoff, MD;  Location: [facility];  Service: Orthopedics;  Laterality: Left;    Family History  Problem Relation Age of Onset  . Diabetes  Sister     No Known Allergies  Current Outpatient Prescriptions on File Prior to Visit  Medication Sig Dispense Refill  . calcitRIOL (ROCALTROL) 0.25 MCG capsule TAKE 1 CAPSULE BY MOUTH THREE TIMES WEEKLY 13 capsule 3  . calcium carbonate (OS-CAL) 600 MG TABS Take 600 mg by mouth daily.     . cetirizine (ZYRTEC) 10 MG tablet Take 10 mg by mouth daily.    Marland Kitchen docusate sodium (COLACE) 100 MG capsule Take 100 mg by mouth daily.    Marland Kitchen doxycycline (VIBRA-TABS) 100 MG tablet Take 1 tablet (100 mg total) by mouth 2 (two) times daily. 20 tablet 0  . levothyroxine (SYNTHROID, LEVOTHROID) 88 MCG tablet TAKE ONE TABLET BY MOUTH ONCE DAILY BEFORE  BREAKFAST 90 tablet 3  . Multiple Vitamins-Minerals (MULTIVITAMINS THER. W/MINERALS) TABS Take 1 tablet by mouth daily.      . vitamin C (ASCORBIC ACID) 500 MG tablet Take 1 tablet (500 mg total) by mouth daily. 50 tablet 0   No current facility-administered medications on file prior to visit.     BP (!) 158/80 (BP Location: Right Arm, Patient Position: Sitting, Cuff Size: Normal)   Pulse 91   Temp 98 F (36.7 C) (Oral)   Ht 5\' 4"  (1.626 m)   Wt 153 lb (69.4 kg)   SpO2 96%   BMI 26.26 kg/m     Review of Systems  Constitutional: Negative.   HENT: Negative for congestion, dental problem, hearing loss, rhinorrhea,  sinus pressure, sore throat and tinnitus.   Eyes: Negative for pain, discharge and visual disturbance.  Respiratory: Negative for cough and shortness of breath.   Cardiovascular: Negative for chest pain, palpitations and leg swelling.  Gastrointestinal: Negative for abdominal distention, abdominal pain, blood in stool, constipation, diarrhea, nausea and vomiting.  Genitourinary: Negative for difficulty urinating, dysuria, flank pain, frequency, hematuria, pelvic pain, urgency, vaginal bleeding, vaginal discharge and vaginal pain.  Musculoskeletal: Negative for arthralgias, gait problem and joint swelling.  Skin: Positive for rash.    Neurological: Negative for dizziness, syncope, speech difficulty, weakness, numbness and headaches.  Hematological: Negative for adenopathy.  Psychiatric/Behavioral: Negative for agitation, behavioral problems and dysphoric mood. The patient is not nervous/anxious.        Objective:   Physical Exam  Constitutional:  Blood pressure 140/76  Cardiovascular: Normal rate.   Pulmonary/Chest: Effort normal and breath sounds normal.  Musculoskeletal: She exhibits no edema.  Skin:  Scattered red inflammatory nodules 0.5 to 1 cm in diameter.  8 nodules were noted over the left buttock and 5.  Similar lesions noted involving the right buttock area          Assessment & Plan:   Recurrent furunculosis .  Local wound care discussed.  Will treat with 10 days of doxycycline which has been quite helpful in the past Surgical hypothyroidism.  Follow-up endocrine  Nyoka Cowden

## 2016-05-08 NOTE — Progress Notes (Signed)
Pre visit review using our clinic review tool, if applicable. No additional management support is needed unless otherwise documented below in the visit note. 

## 2016-05-08 NOTE — Patient Instructions (Addendum)

## 2016-05-24 ENCOUNTER — Encounter: Payer: Self-pay | Admitting: Endocrinology

## 2016-05-24 ENCOUNTER — Ambulatory Visit (INDEPENDENT_AMBULATORY_CARE_PROVIDER_SITE_OTHER): Payer: PPO | Admitting: Endocrinology

## 2016-05-24 VITALS — BP 130/86 | HR 74 | Ht 63.0 in | Wt 151.0 lb

## 2016-05-24 DIAGNOSIS — E208 Other hypoparathyroidism: Secondary | ICD-10-CM

## 2016-05-24 DIAGNOSIS — C73 Malignant neoplasm of thyroid gland: Secondary | ICD-10-CM

## 2016-05-24 DIAGNOSIS — E890 Postprocedural hypothyroidism: Secondary | ICD-10-CM

## 2016-05-24 LAB — TSH: TSH: 1.1 u[IU]/mL (ref 0.35–4.50)

## 2016-05-24 LAB — VITAMIN D 25 HYDROXY (VIT D DEFICIENCY, FRACTURES): VITD: 33.5 ng/mL (ref 30.00–100.00)

## 2016-05-24 NOTE — Progress Notes (Signed)
Subjective:    Patient ID: [name] [name], female    DOB: 07-17-1927, 83 y.o.   MRN: [id number]  HPI The state of at least three ongoing medical problems is addressed today, with interval history of each noted here: Stage-2 follicular adenocarcinoma of the thyroid.    5/07:  right lobectomy--path shows 3 cm right lobe follicular ca (T2 N0 M0). 8/07: completion left lobectomy, no more tumor seen.   9/07  thyroglobulin 11.8 (ab neg).   11/07: RAI, 101 mCi 6/08 thyroglogulin 0.2 (ab neg) 6/08: hypothyroid body scan neg (she tolerated thyroid hormone withdrawal poorly) 6/09 tg undetectable (ab=1.5) 12/10: tg undetectable (ab neg) 6/11:  tg undetectable (ab neg) 6/12: tg undetectable (ab neg) 6/13: tg undetectable (ab neg) 6/15: tg undetectable (ab neg) 6/16: tg undetectable (ab neg).   8/17: tg=0.1 (ab neg).  She denies any nodule at the neck.   Postsurgical hypothyroidism:  She denies cold intolerance.   Postsurgical hypoparathyroidism:  Denies leg cramps.   Past Medical History:  Diagnosis Date  . ALLERGIC RHINITIS 11/02/2006  . HYPERLIPIDEMIA 11/04/2006  . HYPOTHYROIDISM, POSTSURGICAL 03/28/2007  . Malignant neoplasm of thyroid gland ([place]) 03/28/2007   5/07: righte lobectomy--path shows 3 cm follicular ca [id number]: completion left lobectomy, no more tumor seen 09/07: thyroglobulin 0.2 9ab neg) 11/07: i-131 rx, 101 mci 6/08: thyroglogulin 0.2 (ab neg) , hypothyroid body scan neg (clinically tolerated hypothyroidism poorly) 06/09: tg undetectable (ab=1.5, normal) 12/10: tg undetectable (ab neg) 6/11: tg undectable (ab neg)  . OSTEOPENIA 11/02/2006    Past Surgical History:  Procedure Laterality Date  . ABDOMINAL HYSTERECTOMY    . CERVICAL POLYPECTOMY    . OPEN REDUCTION INTERNAL FIXATION (ORIF) DISTAL RADIAL FRACTURE Left 01/14/2014   Procedure: OPEN REDUCTION INTERNAL FIXATION DISTAL RADIAL FRACTURE;  Surgeon: Linna Hoff, MD;  Location: [facility];  Service: Orthopedics;  Laterality:  Left;    Social History   Social History  . Marital status: Married    Spouse name: N/A  . Number of children: N/A  . Years of education: N/A   Occupational History  . Not on file.   Social History Main Topics  . Smoking status: Never Smoker  . Smokeless tobacco: Never Used  . Alcohol use Not on file  . Drug use: Unknown  . Sexual activity: Not on file   Other Topics Concern  . Not on file   Social History Narrative   Doesn't drive    Current Outpatient Prescriptions on File Prior to Visit  Medication Sig Dispense Refill  . calcitRIOL (ROCALTROL) 0.25 MCG capsule TAKE 1 CAPSULE BY MOUTH THREE TIMES WEEKLY 13 capsule 3  . calcium carbonate (OS-CAL) 600 MG TABS Take 600 mg by mouth daily.     . cetirizine (ZYRTEC) 10 MG tablet Take 10 mg by mouth daily.    Marland Kitchen docusate sodium (COLACE) 100 MG capsule Take 100 mg by mouth daily.    Marland Kitchen levothyroxine (SYNTHROID, LEVOTHROID) 88 MCG tablet TAKE ONE TABLET BY MOUTH ONCE DAILY BEFORE  BREAKFAST 90 tablet 3  . Multiple Vitamins-Minerals (MULTIVITAMINS THER. W/MINERALS) TABS Take 1 tablet by mouth daily.      . vitamin C (ASCORBIC ACID) 500 MG tablet Take 1 tablet (500 mg total) by mouth daily. 50 tablet 0   No current facility-administered medications on file prior to visit.     No Known Allergies  Family History  Problem Relation Age of Onset  . Diabetes Sister     BP 130/86  Pulse 74   Ht 5\' 3"  (1.6 m)   Wt 151 lb (68.5 kg)   SpO2 95%   BMI 26.75 kg/m    Review of Systems Denies numbness.  She has slight leg edema    Objective:   Physical Exam VITAL SIGNS:  See vs page GENERAL: no distress.   Neck: a healed scar is present.  i do not appreciate a nodule in the thyroid or elsewhere in the neck.     Lab Results  Component Value Date   TSH 1.10 05/24/2016   Lab Results  Component Value Date   PTH 21 05/24/2016   CALCIUM 8.6 05/24/2016   PHOS 3.4 11/19/2014   TG=undetectable    Assessment & Plan:    Postsurgical hypothyroidism, well-controlled Differentiated thyroid cancer: no evidence of recurrence Postsurgical hypoparathyroidism: well-controlled.  Please continue the same medications  Patient is advised the following: Patient Instructions  blood tests are being requested for you today.  We'll contact you with results. Please come back for a follow-up appointment in 6 months.

## 2016-05-24 NOTE — Patient Instructions (Addendum)
blood tests are being requested for you today.  We'll contact you with results. Please come back for a follow-up appointment in 6 months  

## 2016-05-25 LAB — THYROGLOBULIN ANTIBODY: Thyroglobulin Ab: <1 IU/mL (ref <2)

## 2016-05-25 LAB — THYROGLOBULIN LEVEL: Thyroglobulin: <0.1 ng/mL — ABNORMAL LOW

## 2016-05-25 LAB — PTH, INTACT AND CALCIUM
Calcium: 8.6 mg/dL (ref 8.6–10.4)
PTH: 21 pg/mL (ref 14–64)

## 2016-07-31 ENCOUNTER — Other Ambulatory Visit: Payer: Self-pay | Admitting: Endocrinology

## 2016-09-12 DIAGNOSIS — H6123 Impacted cerumen, bilateral: Secondary | ICD-10-CM | POA: Diagnosis not present

## 2016-09-12 DIAGNOSIS — H903 Sensorineural hearing loss, bilateral: Secondary | ICD-10-CM | POA: Diagnosis not present

## 2016-10-01 ENCOUNTER — Emergency Department (HOSPITAL_COMMUNITY): Payer: PPO

## 2016-10-01 ENCOUNTER — Encounter (HOSPITAL_COMMUNITY): Payer: Self-pay

## 2016-10-01 ENCOUNTER — Emergency Department (HOSPITAL_COMMUNITY)
Admission: EM | Admit: 2016-10-01 | Discharge: 2016-10-01 | Disposition: A | Discharge status: Home / Self Care | Payer: PPO | Attending: Emergency Medicine | Admitting: Emergency Medicine

## 2016-10-01 DIAGNOSIS — Z79899 Other long term (current) drug therapy: Secondary | ICD-10-CM | POA: Insufficient documentation

## 2016-10-01 DIAGNOSIS — Y939 Activity, unspecified: Secondary | ICD-10-CM | POA: Insufficient documentation

## 2016-10-01 DIAGNOSIS — Y999 Unspecified external cause status: Secondary | ICD-10-CM | POA: Insufficient documentation

## 2016-10-01 DIAGNOSIS — Z85850 Personal history of malignant neoplasm of thyroid: Secondary | ICD-10-CM | POA: Diagnosis not present

## 2016-10-01 DIAGNOSIS — Y9289 Other specified places as the place of occurrence of the external cause: Secondary | ICD-10-CM | POA: Insufficient documentation

## 2016-10-01 DIAGNOSIS — E039 Hypothyroidism, unspecified: Secondary | ICD-10-CM | POA: Insufficient documentation

## 2016-10-01 DIAGNOSIS — W01198A Fall on same level from slipping, tripping and stumbling with subsequent striking against other object, initial encounter: Secondary | ICD-10-CM | POA: Insufficient documentation

## 2016-10-01 DIAGNOSIS — S6991XA Unspecified injury of right wrist, hand and finger(s), initial encounter: Secondary | ICD-10-CM | POA: Diagnosis not present

## 2016-10-01 DIAGNOSIS — M7989 Other specified soft tissue disorders: Secondary | ICD-10-CM | POA: Diagnosis not present

## 2016-10-01 DIAGNOSIS — S0990XA Unspecified injury of head, initial encounter: Secondary | ICD-10-CM | POA: Diagnosis not present

## 2016-10-01 DIAGNOSIS — S63681A Other sprain of right thumb, initial encounter: Secondary | ICD-10-CM | POA: Insufficient documentation

## 2016-10-01 NOTE — ED Provider Notes (Signed)
[facility] [facility] DEPT Provider Note   CSN: [id number] Arrival date & time: 10/01/16  1302     History   Chief Complaint Chief Complaint  Patient presents with  . Fall    HPI [name] [name] is a 83 y.o. female.  83 year old female with past medical history below who presents with right thumb injury after a fall. Earlier this morning just before 11 AM, the patient was in the laundry room when she tripped over a detergent bottle, falling backwards and injuring her right thumb. She bumped the back of her head but did not lose consciousness. She applied ice and some sports cream on her thumb and went on to church. She presents now because of the thumb pain, denies any other areas of pain. She denies headache, neck pain, visual changes, chest or abdominal pain, back pain, or other extremity pain. She does not take blood thinners.   The history is provided by the patient.  Fall     Past Medical History:  Diagnosis Date  . ALLERGIC RHINITIS 11/02/2006  . HYPERLIPIDEMIA 11/04/2006  . HYPOTHYROIDISM, POSTSURGICAL 03/28/2007  . Malignant neoplasm of thyroid gland ([place]) 03/28/2007   5/07: righte lobectomy--path shows 3 cm follicular ca 2/37: completion left lobectomy, no more tumor seen 09/07: thyroglobulin 0.2 9ab neg) 11/07: i-131 rx, 101 mci 6/08: thyroglogulin 0.2 (ab neg) , hypothyroid body scan neg (clinically tolerated hypothyroidism poorly) 06/09: tg undetectable (ab=1.5, normal) 12/10: tg undetectable (ab neg) 6/11: tg undectable (ab neg)  . OSTEOPENIA 11/02/2006    Patient Active Problem List   Diagnosis Date Noted  . Numbness 11/19/2014  . Distal radius fracture 01/14/2014  . Malignant neoplasm of thyroid gland ([place]) 03/28/2007  . HYPOTHYROIDISM, POSTSURGICAL 03/28/2007  . HYPOPARATHYROIDISM 03/28/2007  . Dyslipidemia 11/04/2006  . ALLERGIC RHINITIS 11/02/2006  . OSTEOPENIA 11/02/2006    Past Surgical History:  Procedure Laterality Date  . ABDOMINAL HYSTERECTOMY    .  CERVICAL POLYPECTOMY    . OPEN REDUCTION INTERNAL FIXATION (ORIF) DISTAL RADIAL FRACTURE Left 01/14/2014   Procedure: OPEN REDUCTION INTERNAL FIXATION DISTAL RADIAL FRACTURE;  Surgeon: Linna Hoff, MD;  Location: [facility];  Service: Orthopedics;  Laterality: Left;    OB History    No data available       Home Medications    Prior to Admission medications   Medication Sig Start Date End Date Taking? Authorizing Provider  calcitRIOL (ROCALTROL) 0.25 MCG capsule TAKE 1 CAPSULE BY MOUTH THREE TIMES WEEKLY 11/23/15   Renato Shin, MD  calcitRIOL (ROCALTROL) 0.25 MCG capsule TKAE ONE CAPSULE BY MOUTH 3 TIMES WEEKLY 07/31/16   Renato Shin, MD  calcium carbonate (OS-CAL) 600 MG TABS Take 600 mg by mouth daily.     [provider]  cetirizine (ZYRTEC) 10 MG tablet Take 10 mg by mouth daily.    [provider]  docusate sodium (COLACE) 100 MG capsule Take 100 mg by mouth daily.    [provider]  levothyroxine (SYNTHROID, LEVOTHROID) 88 MCG tablet TAKE ONE TABLET BY MOUTH ONCE DAILY BEFORE  BREAKFAST 04/21/16   Renato Shin, MD  Multiple Vitamins-Minerals (MULTIVITAMINS THER. W/MINERALS) TABS Take 1 tablet by mouth daily.      [provider]  vitamin C (ASCORBIC ACID) 500 MG tablet Take 1 tablet (500 mg total) by mouth daily. 01/14/14   Iran Planas, MD    Family History Family History  Problem Relation Age of Onset  . Diabetes Sister     Social History Social History  Substance Use Topics  . Smoking status: Never Smoker  . Smokeless tobacco: Never Used  . Alcohol use Not on file     Allergies   Patient has no known allergies.   Review of Systems Review of Systems 10 Systems reviewed and are negative for acute change except as noted in the HPI.   Physical Exam Updated Vital Signs BP (!) 171/93 (BP Location: Right Arm)   Pulse 79   Temp 98.2 F (36.8 C) (Oral)   Resp 16   Ht 5\' 4"  (1.626 m)   Wt 71.7 kg (158 lb)   SpO2 96%   BMI  27.12 kg/m   Physical Exam  Constitutional: She is oriented to person, place, and time. She appears well-developed and well-nourished. No distress.  Awake, alert  HENT:  Head: Normocephalic and atraumatic.  No scalp contusions or hematomas  Eyes: Conjunctivae and EOM are normal. Pupils are equal, round, and reactive to light.  Neck: Normal range of motion. Neck supple.  No midline tenderness  Cardiovascular: Normal rate, regular rhythm and normal heart sounds.   No murmur heard. Pulmonary/Chest: Effort normal and breath sounds normal. No respiratory distress.  Abdominal: Soft. Bowel sounds are normal. She exhibits no distension. There is no tenderness.  Musculoskeletal: She exhibits edema and tenderness.  Ecchymosis and swelling at base of R thumb, normal flex/extension at IP joint, tenderness over MCP joint, limited ROM 2/2 pain, no obvious joint laxity; normal ROM at wrist and fingers  Neurological: She is alert and oriented to person, place, and time. She has normal reflexes. No cranial nerve deficit. She exhibits normal muscle tone.  Fluent speech, normal finger-to-nose testing 5/5 strength and normal sensation x all 4 extremities  Skin: Skin is warm and dry.  Psychiatric: She has a normal mood and affect. Judgment and thought content normal.  Nursing note and vitals reviewed.    ED Treatments / Results  Labs (all labs ordered are listed, but only abnormal results are displayed) Labs Reviewed - No data to display  EKG  EKG Interpretation None       Radiology Dg Hand Complete Right  Result Date: 10/01/2016 CLINICAL DATA:  Patient reports that today she hit a laundry detergent bottle with her foot and fell backwards onto her wooden porch. Only complains of right hand thumb pain with swelling and bruising to same. No prev hx of injury or surgery EXAM: RIGHT HAND - COMPLETE 3+ VIEW COMPARISON:  None. FINDINGS: No evidence of fracture of the carpal or metacarpal bones.  Radiocarpal joint is intact. Phalanges are normal. No soft tissue injury. Mild spurring of the distal interphalangeal joints. IMPRESSION: No fracture or dislocation. Electronically Signed   By: Suzy Bouchard M.D.   On: 10/01/2016 15:06    Procedures Procedures (including critical care time)  Medications Ordered in ED Medications - No data to display   Initial Impression / Assessment and Plan / ED Course  I have reviewed the triage vital signs and the nursing notes.  Pertinent imaging results that were available during my care of the patient were reviewed by me and considered in my medical decision making (see chart for details).     PT w/ R thumb pain after fall. Neurovascularly intact. Normal neurologic exam and no evidence of head injury on scalp exam. Place patient in thumb spica although imaging is negative, as she may have skiers thumb. Exam limited due to swelling and pain. I have recommended ice and elevation, follow-up with Dr. Caralyn Guile with  whom she has seen previously for other injury. Regarding her fall, she did not lose consciousness, head injury with minor, she does not take blood thinners, and she denies any other complaints to suggest intracranial injury. She is 5 hours out from her injury with no concerning symptoms therefore I do not feel she needs any head imaging at this time. I have extensively reviewed return precautions with her. Patient voiced understanding and was discharged in satisfactory condition.  Final Clinical Impressions(s) / ED Diagnoses   Final diagnoses:  Other sprain of right thumb, initial encounter  Minor head injury, initial encounter    New Prescriptions New Prescriptions   No medications on file     Deward Sebek, Wenda Overland, MD 10/01/16 1626

## 2016-10-01 NOTE — ED Notes (Signed)
Found patient in Waiting room after multiple calls

## 2016-10-01 NOTE — Discharge Instructions (Signed)
KEEP THUMB ELEVATED AND APPLY ICE FOR 20-30 MIN AT A TIME FOR THE NEXT 24 HOURS. FOLLOW UP WITH ORTHOPEDICS IN 1-2 WEEKS. WEAR SPLINT UNLESS BATHING.  RETURN TO ER IMMEDIATELY IF ANY CONFUSION, VISION CHANGES, BALANCE PROBLEMS, OR VOMITING.

## 2016-10-01 NOTE — ED Triage Notes (Signed)
Patient reports that she hit the detergent bottle with her foot and fell backwards, no loc. Only complains of right hand thumb pain with swelling to same. Alert and oriented

## 2016-10-01 NOTE — Progress Notes (Signed)
Orthopedic Tech Progress Note Patient Details:  [name] [name] [birth year] [id number]  Ortho Devices Type of Ortho Device: Thumb velcro splint Ortho Device/Splint Location: RUE Ortho Device/Splint Interventions: Ordered, Application   Braulio Bosch 10/01/2016, 3:46 PM

## 2016-10-01 NOTE — ED Notes (Signed)
Called Ortho for Thumb Spica

## 2016-10-01 NOTE — ED Notes (Signed)
MD Little at the bedside  

## 2016-10-01 NOTE — ED Notes (Signed)
Ortho Tech to come

## 2016-10-01 NOTE — ED Notes (Signed)
Called Xray and stated that they didn't have patient.

## 2016-10-16 DIAGNOSIS — S63641A Sprain of metacarpophalangeal joint of right thumb, initial encounter: Secondary | ICD-10-CM | POA: Diagnosis not present

## 2016-11-13 ENCOUNTER — Encounter: Payer: Self-pay | Admitting: Internal Medicine

## 2016-11-13 ENCOUNTER — Ambulatory Visit (INDEPENDENT_AMBULATORY_CARE_PROVIDER_SITE_OTHER): Payer: PPO | Admitting: Internal Medicine

## 2016-11-13 VITALS — BP 152/80 | HR 78 | Temp 98.1°F | Ht 64.0 in | Wt 154.4 lb

## 2016-11-13 DIAGNOSIS — S80862A Insect bite (nonvenomous), left lower leg, initial encounter: Secondary | ICD-10-CM

## 2016-11-13 NOTE — Patient Instructions (Signed)
Call or return to clinic prn if these symptoms worsen or fail to improve as anticipated.  Call if you develop any worsening pain, swelling or redness to the left leg area

## 2016-11-13 NOTE — Progress Notes (Signed)
Subjective:    Patient ID: [name] [name], female    DOB: Jul 10, 1927, 83 y.o.   MRN: [id number]  HPI  83 year old patient who presents today complaining of pain, redness and swelling involving the left lateral lower leg.  She was bit by an insect in this area.  3 days ago.  She has been treating with ice and elevation and the erythema over the past 24 hours has improved nicely. She has been treated for cellulitis in the past and was concerned about a soft tissue infection.  No fever, chills or other constitutional complaints.  Past Medical History:  Diagnosis Date  . ALLERGIC RHINITIS 11/02/2006  . HYPERLIPIDEMIA 11/04/2006  . HYPOTHYROIDISM, POSTSURGICAL 03/28/2007  . Malignant neoplasm of thyroid gland ([place]) 03/28/2007   5/07: righte lobectomy--path shows 3 cm follicular ca 7/34: completion left lobectomy, no more tumor seen 09/07: thyroglobulin 0.2 9ab neg) 11/07: i-131 rx, 101 mci 6/08: thyroglogulin 0.2 (ab neg) , hypothyroid body scan neg (clinically tolerated hypothyroidism poorly) 06/09: tg undetectable (ab=1.5, normal) 12/10: tg undetectable (ab neg) 6/11: tg undectable (ab neg)  . OSTEOPENIA 11/02/2006     Social History   Social History  . Marital status: Married    Spouse name: N/A  . Number of children: N/A  . Years of education: N/A   Occupational History  . Not on file.   Social History Main Topics  . Smoking status: Never Smoker  . Smokeless tobacco: Never Used  . Alcohol use Not on file  . Drug use: Unknown  . Sexual activity: Not on file   Other Topics Concern  . Not on file   Social History Narrative   Doesn't drive    Past Surgical History:  Procedure Laterality Date  . ABDOMINAL HYSTERECTOMY    . CERVICAL POLYPECTOMY    . OPEN REDUCTION INTERNAL FIXATION (ORIF) DISTAL RADIAL FRACTURE Left 01/14/2014   Procedure: OPEN REDUCTION INTERNAL FIXATION DISTAL RADIAL FRACTURE;  Surgeon: Linna Hoff, MD;  Location: [facility];  Service: Orthopedics;   Laterality: Left;    Family History  Problem Relation Age of Onset  . Diabetes Sister     No Known Allergies  Current Outpatient Prescriptions on File Prior to Visit  Medication Sig Dispense Refill  . calcitRIOL (ROCALTROL) 0.25 MCG capsule TAKE 1 CAPSULE BY MOUTH THREE TIMES WEEKLY 13 capsule 3  . calcitRIOL (ROCALTROL) 0.25 MCG capsule TKAE ONE CAPSULE BY MOUTH 3 TIMES WEEKLY 13 capsule 2  . calcium carbonate (OS-CAL) 600 MG TABS Take 600 mg by mouth daily.     Marland Kitchen docusate sodium (COLACE) 100 MG capsule Take 100 mg by mouth daily.    Marland Kitchen levothyroxine (SYNTHROID, LEVOTHROID) 88 MCG tablet TAKE ONE TABLET BY MOUTH ONCE DAILY BEFORE  BREAKFAST 90 tablet 3  . Multiple Vitamins-Minerals (MULTIVITAMINS THER. W/MINERALS) TABS Take 1 tablet by mouth daily.      . vitamin C (ASCORBIC ACID) 500 MG tablet Take 1 tablet (500 mg total) by mouth daily. 50 tablet 0  . cetirizine (ZYRTEC) 10 MG tablet Take 10 mg by mouth daily.     No current facility-administered medications on file prior to visit.     BP (!) 152/80 (BP Location: Left Arm, Patient Position: Sitting, Cuff Size: Normal)   Pulse 78   Temp 98.1 F (36.7 C) (Oral)   Ht 5\' 4"  (1.626 m)   Wt 154 lb 6.4 oz (70 kg)   SpO2 96%   BMI 26.50 kg/m  Review of Systems  Constitutional: Negative.   HENT: Negative for congestion, dental problem, hearing loss, rhinorrhea, sinus pressure, sore throat and tinnitus.   Eyes: Negative for pain, discharge and visual disturbance.  Respiratory: Negative for cough and shortness of breath.   Cardiovascular: Negative for chest pain, palpitations and leg swelling.  Gastrointestinal: Negative for abdominal distention, abdominal pain, blood in stool, constipation, diarrhea, nausea and vomiting.  Genitourinary: Negative for difficulty urinating, dysuria, flank pain, frequency, hematuria, pelvic pain, urgency, vaginal bleeding, vaginal discharge and vaginal pain.  Musculoskeletal: Negative for  arthralgias, gait problem and joint swelling.  Skin: Positive for color change and wound. Negative for rash.  Neurological: Negative for dizziness, syncope, speech difficulty, weakness, numbness and headaches.  Hematological: Negative for adenopathy.  Psychiatric/Behavioral: Negative for agitation, behavioral problems and dysphoric mood. The patient is not nervous/anxious.        Objective:   Physical Exam  Constitutional: She appears well-developed and well-nourished. No distress.  Afebrile No distress Blood pressure controlled  Skin:  Mild erythema and slight excess of warmth involving the lateral aspect of the left lower leg just proximal to the ankle No tenderness or fluctuance            Assessment & Plan:   Insect bite with local allergic reaction.  The inflammatory changes are improving daily.  Will observe off medication Signs and symptoms of cellulitis discussed.  Will call if she develops any worsening pain, redness or swelling or fever.  Schedule CPX  Nyoka Cowden

## 2016-11-21 ENCOUNTER — Encounter: Payer: Self-pay | Admitting: Endocrinology

## 2016-11-21 ENCOUNTER — Ambulatory Visit (INDEPENDENT_AMBULATORY_CARE_PROVIDER_SITE_OTHER): Payer: PPO | Admitting: Endocrinology

## 2016-11-21 VITALS — BP 142/92 | HR 67 | Wt 155.6 lb

## 2016-11-21 DIAGNOSIS — E890 Postprocedural hypothyroidism: Secondary | ICD-10-CM

## 2016-11-21 DIAGNOSIS — E208 Other hypoparathyroidism: Secondary | ICD-10-CM | POA: Diagnosis not present

## 2016-11-21 DIAGNOSIS — C73 Malignant neoplasm of thyroid gland: Secondary | ICD-10-CM | POA: Diagnosis not present

## 2016-11-21 LAB — VITAMIN D 25 HYDROXY (VIT D DEFICIENCY, FRACTURES): VITD: 48.29 ng/mL (ref 30.00–100.00)

## 2016-11-21 LAB — TSH: TSH: 5.62 u[IU]/mL — AB (ref 0.35–4.50)

## 2016-11-21 MED ORDER — LEVOTHYROXINE SODIUM 100 MCG PO TABS: 100.0000 ug | ORAL_TABLET | Freq: Every day | ORAL | 3 refills | Status: DC

## 2016-11-21 NOTE — Patient Instructions (Signed)
blood tests are being requested for you today.  We'll contact you with results. Please come back for a follow-up appointment in 6 months  

## 2016-11-21 NOTE — Progress Notes (Signed)
Subjective:    Patient ID: [name] [name], female    DOB: [birth year], 83 y.o.   MRN: [id number]  HPI The state of at least three ongoing medical problems is addressed today, with interval history of each noted here: Stage-2 follicular adenocarcinoma of the thyroid.    5/07:  right lobectomy--path shows 3 cm right lobe follicular ca (T2 N0 M0). 8/07: completion left lobectomy, no more tumor seen.   9/07  thyroglobulin 11.8 (ab neg).   11/07: RAI, 101 mCi 6/08 thyroglogulin 0.2 (ab neg) 6/08: hypothyroid body scan neg (she tolerated thyroid hormone withdrawal poorly) 6/09 tg undetectable (ab=1.5) 12/10: tg undetectable (ab neg) 6/11:  tg undetectable (ab neg) 6/12: tg undetectable (ab neg) 6/13: tg undetectable (ab neg) 6/15: tg undetectable (ab neg) 6/16: tg undetectable (ab neg).   8/17: tg=0.1 (ab neg).  She denies any nodule at the neck.  This is a stable problem. Postsurgical hypothyroidism:  She denies cold intolerance.  This is a stable problem. Postsurgical hypoparathyroidism:  Denies leg cramps.  This is a stable problem. Past Medical History:  Diagnosis Date  . ALLERGIC RHINITIS 11/02/2006  . HYPERLIPIDEMIA 11/04/2006  . HYPOTHYROIDISM, POSTSURGICAL 03/28/2007  . Malignant neoplasm of thyroid gland ([place]) 03/28/2007   5/07: righte lobectomy--path shows 3 cm follicular ca 7/85: completion left lobectomy, no more tumor seen 09/07: thyroglobulin 0.2 9ab neg) 11/07: i-131 rx, 101 mci 6/08: thyroglogulin 0.2 (ab neg) , hypothyroid body scan neg (clinically tolerated hypothyroidism poorly) 06/09: tg undetectable (ab=1.5, normal) 12/10: tg undetectable (ab neg) 6/11: tg undectable (ab neg)  . OSTEOPENIA 11/02/2006    Past Surgical History:  Procedure Laterality Date  . ABDOMINAL HYSTERECTOMY    . CERVICAL POLYPECTOMY    . OPEN REDUCTION INTERNAL FIXATION (ORIF) DISTAL RADIAL FRACTURE Left 01/14/2014   Procedure: OPEN REDUCTION INTERNAL FIXATION DISTAL RADIAL FRACTURE;  Surgeon:  Linna Hoff, MD;  Location: [facility];  Service: Orthopedics;  Laterality: Left;    Social History   Social History  . Marital status: Married    Spouse name: N/A  . Number of children: N/A  . Years of education: N/A   Occupational History  . Not on file.   Social History Main Topics  . Smoking status: Never Smoker  . Smokeless tobacco: Never Used  . Alcohol use Not on file  . Drug use: Unknown  . Sexual activity: Not on file   Other Topics Concern  . Not on file   Social History Narrative   Doesn't drive    Current Outpatient Prescriptions on File Prior to Visit  Medication Sig Dispense Refill  . calcitRIOL (ROCALTROL) 0.25 MCG capsule TKAE ONE CAPSULE BY MOUTH 3 TIMES WEEKLY 13 capsule 2  . calcium carbonate (OS-CAL) 600 MG TABS Take 600 mg by mouth daily.     . cetirizine (ZYRTEC) 10 MG tablet Take 10 mg by mouth daily.    Marland Kitchen docusate sodium (COLACE) 100 MG capsule Take 100 mg by mouth daily.    . Multiple Vitamins-Minerals (MULTIVITAMINS THER. W/MINERALS) TABS Take 1 tablet by mouth daily.      . vitamin C (ASCORBIC ACID) 500 MG tablet Take 1 tablet (500 mg total) by mouth daily. 50 tablet 0   No current facility-administered medications on file prior to visit.     No Known Allergies  Family History  Problem Relation Age of Onset  . Diabetes Sister     BP (!) 142/92   Pulse 67   Wt 155 lb  9.6 oz (70.6 kg)   SpO2 96%   BMI 26.71 kg/m    Review of Systems Denies numbness and weight change    Objective:   Physical Exam VITAL SIGNS:  See vs page GENERAL: no distress Neck: a healed scar is present.  I do not appreciate a nodule in the thyroid or elsewhere in the neck      Assessment & Plan:  Differentiated thyroid cancer: no clinical evidence of recurrence Frail elderly state: I told pt she does not need any more thyroid cancer f/u, but she requests to continue.  Postsurgical hypothyroidism: due for recheck.   Postsurgical hypoparathyroidism: due for  recheck.    Patient Instructions  blood tests are being requested for you today.  We'll contact you with results. Please come back for a follow-up appointment in 6 months.

## 2016-11-22 LAB — THYROGLOBULIN ANTIBODY: Thyroglobulin Ab: <1 IU/mL (ref <2)

## 2016-11-22 LAB — THYROGLOBULIN LEVEL: Thyroglobulin: <0.1 ng/mL — ABNORMAL LOW

## 2016-11-23 LAB — PTH, INTACT AND CALCIUM
CALCIUM: 8.7 mg/dL (ref 8.6–10.4)
PTH: 19 pg/mL (ref 14–64)

## 2016-11-27 DIAGNOSIS — S63641D Sprain of metacarpophalangeal joint of right thumb, subsequent encounter: Secondary | ICD-10-CM | POA: Diagnosis not present

## 2016-12-12 DIAGNOSIS — H6123 Impacted cerumen, bilateral: Secondary | ICD-10-CM | POA: Diagnosis not present

## 2016-12-12 DIAGNOSIS — H903 Sensorineural hearing loss, bilateral: Secondary | ICD-10-CM | POA: Diagnosis not present

## 2016-12-14 DIAGNOSIS — N390 Urinary tract infection, site not specified: Secondary | ICD-10-CM | POA: Diagnosis not present

## 2016-12-14 DIAGNOSIS — N958 Other specified menopausal and perimenopausal disorders: Secondary | ICD-10-CM | POA: Diagnosis not present

## 2016-12-14 DIAGNOSIS — Z13820 Encounter for screening for osteoporosis: Secondary | ICD-10-CM | POA: Diagnosis not present

## 2016-12-14 DIAGNOSIS — Z1231 Encounter for screening mammogram for malignant neoplasm of breast: Secondary | ICD-10-CM | POA: Diagnosis not present

## 2016-12-14 DIAGNOSIS — M8588 Other specified disorders of bone density and structure, other site: Secondary | ICD-10-CM | POA: Diagnosis not present

## 2016-12-14 DIAGNOSIS — Z01419 Encounter for gynecological examination (general) (routine) without abnormal findings: Secondary | ICD-10-CM | POA: Diagnosis not present

## 2016-12-14 DIAGNOSIS — Z6827 Body mass index (BMI) 27.0-27.9, adult: Secondary | ICD-10-CM | POA: Diagnosis not present

## 2017-01-08 DIAGNOSIS — S63641D Sprain of metacarpophalangeal joint of right thumb, subsequent encounter: Secondary | ICD-10-CM | POA: Diagnosis not present

## 2017-01-24 ENCOUNTER — Other Ambulatory Visit: Payer: Self-pay | Admitting: Endocrinology

## 2017-04-16 ENCOUNTER — Encounter: Payer: Self-pay | Admitting: Internal Medicine

## 2017-04-16 DIAGNOSIS — R944 Abnormal results of kidney function studies: Secondary | ICD-10-CM | POA: Diagnosis not present

## 2017-04-16 DIAGNOSIS — R7309 Other abnormal glucose: Secondary | ICD-10-CM | POA: Diagnosis not present

## 2017-05-02 DIAGNOSIS — H6123 Impacted cerumen, bilateral: Secondary | ICD-10-CM | POA: Diagnosis not present

## 2017-05-02 DIAGNOSIS — H906 Mixed conductive and sensorineural hearing loss, bilateral: Secondary | ICD-10-CM | POA: Diagnosis not present

## 2017-05-24 ENCOUNTER — Encounter: Payer: Self-pay | Admitting: Endocrinology

## 2017-05-24 ENCOUNTER — Ambulatory Visit: Payer: PPO | Admitting: Endocrinology

## 2017-05-24 VITALS — BP 138/78 | HR 72 | Wt 145.8 lb

## 2017-05-24 DIAGNOSIS — E890 Postprocedural hypothyroidism: Secondary | ICD-10-CM

## 2017-05-24 DIAGNOSIS — C73 Malignant neoplasm of thyroid gland: Secondary | ICD-10-CM

## 2017-05-24 DIAGNOSIS — E208 Other hypoparathyroidism: Secondary | ICD-10-CM | POA: Diagnosis not present

## 2017-05-24 DIAGNOSIS — E2089 Other specified hypoparathyroidism: Secondary | ICD-10-CM

## 2017-05-24 LAB — TSH: TSH: 0.36 u[IU]/mL (ref 0.35–4.50)

## 2017-05-24 NOTE — Patient Instructions (Signed)
blood tests are being requested for you today.  We'll contact you with results. Please come back for a follow-up appointment in 6 months  

## 2017-05-24 NOTE — Progress Notes (Signed)
Subjective:    Patient ID: [name] [name], female    DOB: 06/15/1927, 84 y.o.   MRN: [id number]  HPI he state of at least three ongoing medical problems is addressed today, with interval history of each noted here: Stage-2 follicular adenocarcinoma of the thyroid.    5/07:  right lobectomy--path shows 3 cm right lobe follicular ca (T2 N0 M0). 8/07: completion left lobectomy, no more tumor seen.   9/07  thyroglobulin 11.8 (ab neg).   11/07: RAI, 101 mCi 6/08 thyroglogulin 0.2 (ab neg) 6/08: hypothyroid body scan neg (she tolerated thyroid hormone withdrawal poorly) 6/09 tg undetectable (ab=1.5) 12/10: tg undetectable (ab neg) 6/11:  tg undetectable (ab neg) 6/12: tg undetectable (ab neg) 6/13: tg undetectable (ab neg) 6/15: tg undetectable (ab neg) 6/16: tg undetectable (ab neg).   8/17: tg=0.1 (ab neg).  She denies any nodule at the neck.  This is a stable problem. Postsurgical hypothyroidism:  She denies cold intolerance.  This is a stable problem.   Postsurgical hypoparathyroidism:  Denies leg cramps.  This is a stable problem.   Past Medical History:  Diagnosis Date  . ALLERGIC RHINITIS 11/02/2006  . HYPERLIPIDEMIA 11/04/2006  . HYPOTHYROIDISM, POSTSURGICAL 03/28/2007  . Malignant neoplasm of thyroid gland ([place]) 03/28/2007   5/07: righte lobectomy--path shows 3 cm follicular ca 6/28: completion left lobectomy, no more tumor seen 09/07: thyroglobulin 0.2 9ab neg) 11/07: i-131 rx, 101 mci 6/08: thyroglogulin 0.2 (ab neg) , hypothyroid body scan neg (clinically tolerated hypothyroidism poorly) 06/09: tg undetectable (ab=1.5, normal) 12/10: tg undetectable (ab neg) 6/11: tg undectable (ab neg)  . OSTEOPENIA 11/02/2006    Past Surgical History:  Procedure Laterality Date  . ABDOMINAL HYSTERECTOMY    . CERVICAL POLYPECTOMY    . OPEN REDUCTION INTERNAL FIXATION (ORIF) DISTAL RADIAL FRACTURE Left 01/14/2014   Procedure: OPEN REDUCTION INTERNAL FIXATION DISTAL RADIAL FRACTURE;   Surgeon: Linna Hoff, MD;  Location: [facility];  Service: Orthopedics;  Laterality: Left;    Social History   Socioeconomic History  . Marital status: Married    Spouse name: Not on file  . Number of children: Not on file  . Years of education: Not on file  . Highest education level: Not on file  Social Needs  . Financial resource strain: Not on file  . Food insecurity - worry: Not on file  . Food insecurity - inability: Not on file  . Transportation needs - medical: Not on file  . Transportation needs - non-medical: Not on file  Occupational History  . Not on file  Tobacco Use  . Smoking status: Never Smoker  . Smokeless tobacco: Never Used  Substance and Sexual Activity  . Alcohol use: Not on file  . Drug use: Not on file  . Sexual activity: Not on file  Other Topics Concern  . Not on file  Social History Narrative   Doesn't drive    Current Outpatient Medications on File Prior to Visit  Medication Sig Dispense Refill  . calcitRIOL (ROCALTROL) 0.25 MCG capsule TAKE 1 CAPSULE BY MOUTH THREE TIMES A WEEK 13 capsule 2  . calcium carbonate (OS-CAL) 600 MG TABS Take 600 mg by mouth daily.     . cetirizine (ZYRTEC) 10 MG tablet Take 10 mg by mouth daily.    Marland Kitchen docusate sodium (COLACE) 100 MG capsule Take 100 mg by mouth daily.    Marland Kitchen levothyroxine (SYNTHROID, LEVOTHROID) 100 MCG tablet Take 1 tablet (100 mcg total) by mouth daily before breakfast.  90 tablet 3  . Multiple Vitamins-Minerals (MULTIVITAMINS THER. W/MINERALS) TABS Take 1 tablet by mouth daily.      . vitamin C (ASCORBIC ACID) 500 MG tablet Take 1 tablet (500 mg total) by mouth daily. 50 tablet 0   No current facility-administered medications on file prior to visit.     No Known Allergies  Family History  Problem Relation Age of Onset  . Diabetes Sister     BP 138/78 (BP Location: Left Arm, Patient Position: Sitting, Cuff Size: Normal)   Pulse 72   Wt 145 lb 12.8 oz (66.1 kg)   SpO2 94%   BMI 25.03 kg/m     Review of Systems Denies numbness and weight change.     Objective:   Physical Exam VITAL SIGNS:  See vs page GENERAL: no distress Neck: a healed scar is present.  I do not appreciate a nodule in the thyroid or elsewhere in the neck       Assessment & Plan:  Stage-2 follicular adenocarcinoma of the thyroid: due for recheck Postsurgical hypothyroidism:due for recheck Postsurgical hypoparathyroidism: due for recheck  Patient Instructions  blood tests are being requested for you today.  We'll contact you with results. Please come back for a follow-up appointment in 6 months.

## 2017-05-25 LAB — PTH, INTACT AND CALCIUM
CALCIUM: 8.1 mg/dL — AB (ref 8.6–10.4)
PTH: 21 pg/mL (ref 14–64)

## 2017-05-25 LAB — VITAMIN D 25 HYDROXY (VIT D DEFICIENCY, FRACTURES): VITD: 48.3 ng/mL (ref 30.00–100.00)

## 2017-05-25 LAB — THYROGLOBULIN LEVEL

## 2017-05-25 LAB — THYROGLOBULIN ANTIBODY

## 2017-05-29 ENCOUNTER — Telehealth: Payer: Self-pay | Admitting: Endocrinology

## 2017-05-29 NOTE — Telephone Encounter (Signed)
I called patient & gave her lab results.

## 2017-05-29 NOTE — Telephone Encounter (Signed)
Patient received voice mail but her answering machine was messed up so she could not understand the message. She would like a call at ph# [phone] to find out about her lab results and anything else the message was about

## 2017-08-04 ENCOUNTER — Emergency Department (HOSPITAL_COMMUNITY)
Admission: EM | Admit: 2017-08-04 | Discharge: 2017-08-04 | Disposition: A | Discharge status: Home / Self Care | Payer: PPO | Source: Home / Self Care | Attending: Emergency Medicine | Admitting: Emergency Medicine

## 2017-08-04 ENCOUNTER — Emergency Department (HOSPITAL_COMMUNITY): Payer: PPO

## 2017-08-04 ENCOUNTER — Other Ambulatory Visit: Payer: Self-pay

## 2017-08-04 ENCOUNTER — Encounter (HOSPITAL_COMMUNITY): Payer: Self-pay | Admitting: *Deleted

## 2017-08-04 DIAGNOSIS — E890 Postprocedural hypothyroidism: Secondary | ICD-10-CM | POA: Diagnosis not present

## 2017-08-04 DIAGNOSIS — W5501XA Bitten by cat, initial encounter: Secondary | ICD-10-CM | POA: Insufficient documentation

## 2017-08-04 DIAGNOSIS — L0291 Cutaneous abscess, unspecified: Secondary | ICD-10-CM | POA: Diagnosis not present

## 2017-08-04 DIAGNOSIS — M810 Age-related osteoporosis without current pathological fracture: Secondary | ICD-10-CM | POA: Diagnosis not present

## 2017-08-04 DIAGNOSIS — L03113 Cellulitis of right upper limb: Secondary | ICD-10-CM | POA: Diagnosis not present

## 2017-08-04 DIAGNOSIS — E785 Hyperlipidemia, unspecified: Secondary | ICD-10-CM | POA: Diagnosis not present

## 2017-08-04 DIAGNOSIS — E039 Hypothyroidism, unspecified: Secondary | ICD-10-CM | POA: Insufficient documentation

## 2017-08-04 DIAGNOSIS — Y939 Activity, unspecified: Secondary | ICD-10-CM | POA: Insufficient documentation

## 2017-08-04 DIAGNOSIS — Z79899 Other long term (current) drug therapy: Secondary | ICD-10-CM | POA: Insufficient documentation

## 2017-08-04 DIAGNOSIS — Y999 Unspecified external cause status: Secondary | ICD-10-CM

## 2017-08-04 DIAGNOSIS — N183 Chronic kidney disease, stage 3 (moderate): Secondary | ICD-10-CM | POA: Diagnosis not present

## 2017-08-04 DIAGNOSIS — Z23 Encounter for immunization: Secondary | ICD-10-CM

## 2017-08-04 DIAGNOSIS — Z85850 Personal history of malignant neoplasm of thyroid: Secondary | ICD-10-CM | POA: Diagnosis not present

## 2017-08-04 DIAGNOSIS — M79644 Pain in right finger(s): Secondary | ICD-10-CM | POA: Diagnosis not present

## 2017-08-04 DIAGNOSIS — Y92009 Unspecified place in unspecified non-institutional (private) residence as the place of occurrence of the external cause: Secondary | ICD-10-CM | POA: Diagnosis not present

## 2017-08-04 DIAGNOSIS — S51851A Open bite of right forearm, initial encounter: Secondary | ICD-10-CM | POA: Insufficient documentation

## 2017-08-04 DIAGNOSIS — W07XXXA Fall from chair, initial encounter: Secondary | ICD-10-CM

## 2017-08-04 DIAGNOSIS — L02413 Cutaneous abscess of right upper limb: Secondary | ICD-10-CM | POA: Diagnosis not present

## 2017-08-04 DIAGNOSIS — S51831A Puncture wound without foreign body of right forearm, initial encounter: Secondary | ICD-10-CM | POA: Diagnosis not present

## 2017-08-04 DIAGNOSIS — Y929 Unspecified place or not applicable: Secondary | ICD-10-CM

## 2017-08-04 DIAGNOSIS — L089 Local infection of the skin and subcutaneous tissue, unspecified: Secondary | ICD-10-CM | POA: Diagnosis not present

## 2017-08-04 DIAGNOSIS — S59911A Unspecified injury of right forearm, initial encounter: Secondary | ICD-10-CM | POA: Diagnosis not present

## 2017-08-04 DIAGNOSIS — S6991XA Unspecified injury of right wrist, hand and finger(s), initial encounter: Secondary | ICD-10-CM | POA: Diagnosis not present

## 2017-08-04 DIAGNOSIS — S51851S Open bite of right forearm, sequela: Secondary | ICD-10-CM | POA: Diagnosis not present

## 2017-08-04 DIAGNOSIS — D649 Anemia, unspecified: Secondary | ICD-10-CM | POA: Diagnosis not present

## 2017-08-04 DIAGNOSIS — W5501XS Bitten by cat, sequela: Secondary | ICD-10-CM | POA: Diagnosis not present

## 2017-08-04 MED ORDER — AMOXICILLIN-POT CLAVULANATE 875-125 MG PO TABS: 1.0000 | ORAL_TABLET | Freq: Two times a day (BID) | ORAL | 0 refills | Status: DC

## 2017-08-04 MED ORDER — TETANUS-DIPHTH-ACELL PERTUSSIS 5-2.5-18.5 LF-MCG/0.5 IM SUSP
0.5000 mL | Freq: Once | INTRAMUSCULAR | Status: AC
  Administered 2017-08-04: 0.5 mL via INTRAMUSCULAR
  Filled 2017-08-04: qty 0.5

## 2017-08-04 MED ORDER — AMOXICILLIN-POT CLAVULANATE 875-125 MG PO TABS
1.0000 | ORAL_TABLET | Freq: Once | ORAL | Status: AC
  Administered 2017-08-04: 1 via ORAL
  Filled 2017-08-04: qty 1

## 2017-08-04 NOTE — ED Notes (Signed)
ED Provider at bedside. 

## 2017-08-04 NOTE — ED Provider Notes (Signed)
[facility] EMERGENCY DEPARTMENT Provider Note   CSN: [id number] Arrival date & time: 08/04/17  0143     History   Chief Complaint Chief Complaint  Patient presents with  . Animal Bite    HPI [name] [name] is a 84 y.o. female with a hx of osteopenia, hyperlipidemia, allergic rhinitis, postsurgical hypothyroidism presents to the Emergency Department complaining of being bitten by a cat just after midnight tonight.  Patient reports she was sitting on a stool to take out her dentures when she leaned forward and lost her balance.  She reports she fell off the stool striking her left hip on the floor.  She reports that when she screamed her cat attacked her.  She states the cat scratched and bit her right arm.  She states she was able to "beat off" the cat with her cane.  Patient reports she was able to get up off the floor and has been ambulatory without difficulty since that time.  She denies any pain in her hip, leg or low back.  She reports her cat is primarily an indoor cat and has only been outside once in the last year however during that time the cat did become pregnant.  The cat is not up-to-date on its shots.  She reports the cat has never attacked her before.  She reports washing the wounds at home prior to arrival.  No aggravating or alleviating factors.  She denies a history of diabetes or immunocompromise.  She denies numbness, tingling or weakness.  Unknown last tetanus.     The history is provided by the patient and medical records. No language interpreter was used.    Past Medical History:  Diagnosis Date  . ALLERGIC RHINITIS 11/02/2006  . HYPERLIPIDEMIA 11/04/2006  . HYPOTHYROIDISM, POSTSURGICAL 03/28/2007  . Malignant neoplasm of thyroid gland ([place]) 03/28/2007   5/07: righte lobectomy--path shows 3 cm follicular ca 7/35: completion left lobectomy, no more tumor seen 09/07: thyroglobulin 0.2 9ab neg) 11/07: i-131 rx, 101 [facility] 6/08: thyroglogulin 0.2 (ab neg)  , hypothyroid body scan neg (clinically tolerated hypothyroidism poorly) 06/09: tg undetectable (ab=1.5, normal) 12/10: tg undetectable (ab neg) 6/11: tg undectable (ab neg)  . OSTEOPENIA 11/02/2006    Patient Active Problem List   Diagnosis Date Noted  . Numbness 11/19/2014  . Distal radius fracture 01/14/2014  . Malignant neoplasm of thyroid gland ([place]) 03/28/2007  . HYPOTHYROIDISM, POSTSURGICAL 03/28/2007  . HYPOPARATHYROIDISM 03/28/2007  . Dyslipidemia 11/04/2006  . ALLERGIC RHINITIS 11/02/2006  . OSTEOPENIA 11/02/2006    Past Surgical History:  Procedure Laterality Date  . ABDOMINAL HYSTERECTOMY    . CERVICAL POLYPECTOMY    . OPEN REDUCTION INTERNAL FIXATION (ORIF) DISTAL RADIAL FRACTURE Left 01/14/2014   Procedure: OPEN REDUCTION INTERNAL FIXATION DISTAL RADIAL FRACTURE;  Surgeon: Linna Hoff, MD;  Location: [facility];  Service: Orthopedics;  Laterality: Left;     OB History   None      Home Medications    Prior to Admission medications   Medication Sig Start Date End Date Taking? Authorizing Provider  amoxicillin-clavulanate (AUGMENTIN) 875-125 MG tablet Take 1 tablet by mouth 2 (two) times daily. One po bid x 7 days 08/04/17   Laurie Penado, Jarrett Soho, PA-C  calcitRIOL (ROCALTROL) 0.25 MCG capsule TAKE 1 CAPSULE BY MOUTH THREE TIMES A WEEK 01/24/17   Renato Shin, MD  calcium carbonate (OS-CAL) 600 MG TABS Take 600 mg by mouth daily.     [provider]  cetirizine (ZYRTEC) 10 MG  tablet Take 10 mg by mouth daily.    [provider]  docusate sodium (COLACE) 100 MG capsule Take 100 mg by mouth daily.    [provider]  levothyroxine (SYNTHROID, LEVOTHROID) 100 MCG tablet Take 1 tablet (100 mcg total) by mouth daily before breakfast. 11/21/16   Renato Shin, MD  Multiple Vitamins-Minerals (MULTIVITAMINS THER. W/MINERALS) TABS Take 1 tablet by mouth daily.      [provider]  vitamin C (ASCORBIC ACID) 500 MG tablet Take 1 tablet (500 mg  total) by mouth daily. 01/14/14   Iran Planas, MD    Family History Family History  Problem Relation Age of Onset  . Diabetes Sister     Social History Social History   Tobacco Use  . Smoking status: Never Smoker  . Smokeless tobacco: Never Used  Substance Use Topics  . Alcohol use: Not on file  . Drug use: Not on file     Allergies   Patient has no known allergies.   Review of Systems Review of Systems  Constitutional: Negative for appetite change, diaphoresis, fatigue, fever and unexpected weight change.  HENT: Negative for mouth sores.   Eyes: Negative for visual disturbance.  Respiratory: Negative for cough, chest tightness, shortness of breath and wheezing.   Cardiovascular: Negative for chest pain.  Gastrointestinal: Negative for abdominal pain, constipation, diarrhea, nausea and vomiting.  Endocrine: Negative for polydipsia, polyphagia and polyuria.  Genitourinary: Negative for dysuria, frequency, hematuria and urgency.  Musculoskeletal: Negative for back pain and neck stiffness.  Skin: Positive for wound. Negative for rash.  Allergic/Immunologic: Negative for immunocompromised state.  Neurological: Negative for syncope, light-headedness and headaches.  Hematological: Does not bruise/bleed easily.  Psychiatric/Behavioral: Negative for sleep disturbance. The patient is not nervous/anxious.      Physical Exam Updated Vital Signs BP (!) 172/86   Pulse 70   Temp 99.7 F (37.6 C) (Oral)   Resp 20   Ht 5\' 4"  (1.626 m)   Wt 68 kg (150 lb)   SpO2 100%   BMI 25.75 kg/m   Physical Exam  Constitutional: She appears well-developed and well-nourished. No distress.  Awake, alert, nontoxic appearance  HENT:  Head: Normocephalic and atraumatic.  Mouth/Throat: Oropharynx is clear and moist. No oropharyngeal exudate.  Eyes: Conjunctivae are normal. No scleral icterus.  Neck: Normal range of motion. Neck supple.  Cardiovascular: Normal rate, regular rhythm and  intact distal pulses.  Pulmonary/Chest: Effort normal and breath sounds normal. No respiratory distress. She has no wheezes.  Equal chest expansion  Abdominal: Soft. Bowel sounds are normal. She exhibits no mass. There is no tenderness. There is no rebound and no guarding.  Musculoskeletal: Normal range of motion. She exhibits no edema.  Full range of motion of the right shoulder, elbow, wrist and hand.  Slightly decreased range of motion of the right thumb. Full range of motion of the left hip without pain.  No midline or paraspinal tenderness to the T-spine or L-spine.  Neurological: She is alert.  Speech is clear and goal oriented Moves extremities without ataxia Patient intact to normal touch in the entire right upper extremity Strength 5/5 in the right upper extremity including strong grip strength.    Skin: Skin is warm and dry. She is not diaphoretic.  Numerous scratches and puncture wounds to the right forearm  Psychiatric: She has a normal mood and affect.  Nursing note and vitals reviewed.  ED Treatments / Results   Radiology Dg Forearm Right  Result Date: 08/04/2017 CLINICAL DATA:  Patient was bit by her cat this morning after she fell down. Patient has multiple scratches and bites to right forearm and is also complains of right thumb pain EXAM: RIGHT FOREARM - 2 VIEW COMPARISON:  None. FINDINGS: Radius and ulna appear intact. No acute fracture or dislocation. Mild ulna minus. Degenerative changes in the carpus. Soft tissues are unremarkable. No radiopaque soft tissue foreign bodies. IMPRESSION: No acute bony abnormalities. No radiopaque soft tissue foreign bodies. Electronically Signed   By: Lucienne Capers M.D.   On: 08/04/2017 06:23   Dg Finger Thumb Right  Result Date: 08/04/2017 CLINICAL DATA:  Patient was bit by her cat this morning after she fell down. Patient has multiple scratches and bites to right forearm and is also complains of right  thumb pain EXAM: RIGHT THUMB 2+V COMPARISON:  Right hand 10/01/2016 FINDINGS: Degenerative changes in the first metacarpal phalangeal and interphalangeal joints. No evidence of acute fracture or dislocation. No focal bone lesion or bone destruction. Soft tissue swelling over the metacarpal phalangeal joint. No radiopaque soft tissue foreign bodies or gas collections. IMPRESSION: Degenerative changes in the right first finger. Soft tissue swelling over the metacarpal phalangeal joint. No acute bony abnormalities. No radiopaque foreign bodies. Electronically Signed   By: Lucienne Capers M.D.   On: 08/04/2017 06:24    Procedures Procedures (including critical care time)  Medications Ordered in ED Medications  Tdap (BOOSTRIX) injection 0.5 mL (has no administration in time range)  amoxicillin-clavulanate (AUGMENTIN) 875-125 MG per tablet 1 tablet (1 tablet Oral Given 08/04/17 0544)     Initial Impression / Assessment and Plan / ED Course  I have reviewed the triage vital signs and the nursing notes.  Pertinent labs & imaging results that were available during my care of the patient were reviewed by me and considered in my medical decision making (see chart for details).     Patient presents with cat bite and scratches after fall.  Patient with full range of motion of the left hip no midline tenderness to her spine.  Wounds soaked and irrigated with copious amounts of saline.  Patient without immunocompromise or diabetes.  Augmentin given here in the emergency department and patient will be discharged home with the same.    Long discussion with patient about rabies.  Her cat is unvaccinated but is largely an indoor cat.  Cat's behavior tonight was not out of the ordinary.  Risk and benefit discussed.  Patient wishes to defer rabies vaccine and loop of monitoring of the cat.  Discussed the importance of close animal control follow-up today.  X-rays are without foreign bodies, acute fractures or  dislocations.  I personally evaluated the images.  Discussed wound care and importance of close follow-up.  Patient is to return in 24 hours for wound check.  She states understanding and is in agreement with the plan.  Final Clinical Impressions(s) / ED Diagnoses   Final diagnoses:  Cat bite, initial encounter  Fall from chair, initial encounter    ED Discharge Orders        Ordered    amoxicillin-clavulanate (AUGMENTIN) 875-125 MG tablet  2 times daily     08/04/17 0637       Atanacio Melnyk, Jarrett Soho, PA-C 71/24/58 0998    Delora Fuel, MD 33/82/50 [phone]

## 2017-08-04 NOTE — ED Notes (Signed)
Pt departed in NAD, refused use of wheelchair.  

## 2017-08-04 NOTE — ED Notes (Signed)
Pt's arm placed in basin to soak in saline/iodine solution.

## 2017-08-04 NOTE — Discharge Instructions (Addendum)
1. Medications: Augmentin, usual home medications 2. Treatment: rest, drink plenty of fluids, keep wounds clean with warm soap and water 3. Follow Up: Please followup with your primary doctor in 2 days for discussion of your diagnoses and further evaluation after today's visit; if you do not have a primary care doctor use the resource guide provided to find one; Please return to the ER for wound check in 24 hours or sooner for signs of infection.  Please call animal control for close observation of the cat.

## 2017-08-04 NOTE — ED Triage Notes (Signed)
The pt was bitten and scratched  By her cat tonight whenever  She fell in her bathroom  The pt has numersu scratched and  Bites to her rt arm  The cat has not had its shots

## 2017-08-04 NOTE — ED Notes (Signed)
Pt ambulatory to room in Pod D from waiting area without difficulty or assistance.

## 2017-08-04 NOTE — ED Notes (Signed)
Patient transported to X-ray 

## 2017-08-04 NOTE — ED Notes (Signed)
Arm bandaged with xeroform and wrapped with gauze roll. Antibiotic ointment placed on scratches.

## 2017-08-05 ENCOUNTER — Encounter (HOSPITAL_COMMUNITY): Payer: Self-pay | Admitting: Emergency Medicine

## 2017-08-05 ENCOUNTER — Inpatient Hospital Stay (HOSPITAL_COMMUNITY)
Admission: EM | Admit: 2017-08-05 | Discharge: 2017-08-08 | DRG: 605 | Disposition: A | Discharge status: Home / Self Care | Payer: PPO | Attending: Internal Medicine | Admitting: Internal Medicine

## 2017-08-05 ENCOUNTER — Other Ambulatory Visit: Payer: Self-pay

## 2017-08-05 DIAGNOSIS — D649 Anemia, unspecified: Secondary | ICD-10-CM | POA: Diagnosis present

## 2017-08-05 DIAGNOSIS — Z23 Encounter for immunization: Secondary | ICD-10-CM

## 2017-08-05 DIAGNOSIS — Z85850 Personal history of malignant neoplasm of thyroid: Secondary | ICD-10-CM

## 2017-08-05 DIAGNOSIS — N183 Chronic kidney disease, stage 3 unspecified: Secondary | ICD-10-CM | POA: Diagnosis present

## 2017-08-05 DIAGNOSIS — E785 Hyperlipidemia, unspecified: Secondary | ICD-10-CM | POA: Diagnosis present

## 2017-08-05 DIAGNOSIS — L089 Local infection of the skin and subcutaneous tissue, unspecified: Secondary | ICD-10-CM | POA: Diagnosis not present

## 2017-08-05 DIAGNOSIS — Y92009 Unspecified place in unspecified non-institutional (private) residence as the place of occurrence of the external cause: Secondary | ICD-10-CM | POA: Diagnosis not present

## 2017-08-05 DIAGNOSIS — L0291 Cutaneous abscess, unspecified: Secondary | ICD-10-CM | POA: Diagnosis present

## 2017-08-05 DIAGNOSIS — S51859A Open bite of unspecified forearm, initial encounter: Secondary | ICD-10-CM

## 2017-08-05 DIAGNOSIS — L03113 Cellulitis of right upper limb: Secondary | ICD-10-CM | POA: Diagnosis present

## 2017-08-05 DIAGNOSIS — W5501XA Bitten by cat, initial encounter: Secondary | ICD-10-CM

## 2017-08-05 DIAGNOSIS — Z79899 Other long term (current) drug therapy: Secondary | ICD-10-CM | POA: Diagnosis not present

## 2017-08-05 DIAGNOSIS — N1831 Chronic kidney disease, stage 3a: Secondary | ICD-10-CM | POA: Diagnosis present

## 2017-08-05 DIAGNOSIS — E890 Postprocedural hypothyroidism: Secondary | ICD-10-CM | POA: Diagnosis present

## 2017-08-05 DIAGNOSIS — S51851A Open bite of right forearm, initial encounter: Secondary | ICD-10-CM | POA: Diagnosis present

## 2017-08-05 DIAGNOSIS — M810 Age-related osteoporosis without current pathological fracture: Secondary | ICD-10-CM | POA: Diagnosis present

## 2017-08-05 DIAGNOSIS — W5501XS Bitten by cat, sequela: Secondary | ICD-10-CM | POA: Diagnosis not present

## 2017-08-05 DIAGNOSIS — N179 Acute kidney failure, unspecified: Secondary | ICD-10-CM | POA: Diagnosis present

## 2017-08-05 DIAGNOSIS — E039 Hypothyroidism, unspecified: Secondary | ICD-10-CM | POA: Diagnosis not present

## 2017-08-05 DIAGNOSIS — S51851S Open bite of right forearm, sequela: Secondary | ICD-10-CM | POA: Diagnosis not present

## 2017-08-05 HISTORY — DX: Chronic kidney disease, stage 3 unspecified: N18.30

## 2017-08-05 HISTORY — DX: Chronic kidney disease, stage 3 (moderate): N18.3

## 2017-08-05 LAB — BASIC METABOLIC PANEL
Anion gap: 9 (ref 5–15)
BUN: 19 mg/dL (ref 6–20)
CALCIUM: 7.7 mg/dL — AB (ref 8.9–10.3)
CO2: 26 mmol/L (ref 22–32)
CREATININE: 1.05 mg/dL — AB (ref 0.44–1.00)
Chloride: 101 mmol/L (ref 101–111)
GFR calc non Af Amer: 46 mL/min — ABNORMAL LOW (ref >60)
GFR, EST AFRICAN AMERICAN: 54 mL/min — AB (ref >60)
Glucose, Bld: 135 mg/dL — ABNORMAL HIGH (ref 65–99)
Potassium: 3.8 mmol/L (ref 3.5–5.1)
SODIUM: 136 mmol/L (ref 135–145)

## 2017-08-05 LAB — CBC WITH DIFFERENTIAL/PLATELET
BASOS PCT: 0 %
Basophils Absolute: 0 10*3/uL (ref 0.0–0.1)
EOS ABS: 0.1 10*3/uL (ref 0.0–0.7)
EOS PCT: 2 %
HCT: 38.8 % (ref 36.0–46.0)
Hemoglobin: 12.1 g/dL (ref 12.0–15.0)
Lymphocytes Relative: 20 %
Lymphs Abs: 1.8 10*3/uL (ref 0.7–4.0)
MCH: 26.7 pg (ref 26.0–34.0)
MCHC: 31.2 g/dL (ref 30.0–36.0)
MCV: 85.7 fL (ref 78.0–100.0)
MONOS PCT: 7 %
Monocytes Absolute: 0.7 10*3/uL (ref 0.1–1.0)
Neutro Abs: 6.3 10*3/uL (ref 1.7–7.7)
Neutrophils Relative %: 71 %
PLATELETS: 337 10*3/uL (ref 150–400)
RBC: 4.53 MIL/uL (ref 3.87–5.11)
RDW: 14 % (ref 11.5–15.5)
WBC: 8.9 10*3/uL (ref 4.0–10.5)

## 2017-08-05 LAB — LACTIC ACID, PLASMA
Lactic Acid, Venous: 0.6 mmol/L (ref 0.5–1.9)
Lactic Acid, Venous: 1.1 mmol/L (ref 0.5–1.9)

## 2017-08-05 LAB — SEDIMENTATION RATE: SED RATE: 50 mm/h — AB (ref 0–22)

## 2017-08-05 MED ORDER — CALCITRIOL 0.25 MCG PO CAPS
0.2500 ug | ORAL_CAPSULE | ORAL | Status: DC
  Administered 2017-08-06 – 2017-08-08 (×2): 0.25 ug via ORAL
  Filled 2017-08-05 (×2): qty 1

## 2017-08-05 MED ORDER — PIPERACILLIN-TAZOBACTAM 3.375 G IVPB 30 MIN
3.3750 g | Freq: Once | INTRAVENOUS | Status: AC
  Administered 2017-08-05: 3.375 g via INTRAVENOUS
  Filled 2017-08-05: qty 50

## 2017-08-05 MED ORDER — SODIUM CHLORIDE 0.9 % IV SOLN
1.5000 g | Freq: Once | INTRAVENOUS | Status: DC
  Filled 2017-08-05: qty 1.5

## 2017-08-05 MED ORDER — VANCOMYCIN HCL 10 G IV SOLR
1250.0000 mg | Freq: Once | INTRAVENOUS | Status: AC
  Administered 2017-08-05: 1250 mg via INTRAVENOUS
  Filled 2017-08-05: qty 1250

## 2017-08-05 MED ORDER — ONDANSETRON HCL 4 MG/2ML IJ SOLN: 4.0000 mg | Freq: Four times a day (QID) | INTRAMUSCULAR | Status: DC | PRN

## 2017-08-05 MED ORDER — LEVOTHYROXINE SODIUM 100 MCG PO TABS
100.0000 ug | ORAL_TABLET | Freq: Every day | ORAL | Status: DC
  Administered 2017-08-06 – 2017-08-08 (×3): 100 ug via ORAL
  Filled 2017-08-05 (×3): qty 1

## 2017-08-05 MED ORDER — SODIUM CHLORIDE 0.9 % IV SOLN
1.5000 g | Freq: Three times a day (TID) | INTRAVENOUS | Status: DC
  Administered 2017-08-05 – 2017-08-08 (×8): 1.5 g via INTRAVENOUS
  Filled 2017-08-05 (×9): qty 1.5

## 2017-08-05 MED ORDER — CALCIUM CARBONATE 1250 (500 CA) MG PO TABS
1.0000 | ORAL_TABLET | Freq: Every day | ORAL | Status: DC
  Administered 2017-08-06 – 2017-08-08 (×3): 500 mg via ORAL
  Filled 2017-08-05 (×3): qty 1

## 2017-08-05 MED ORDER — OXYCODONE HCL 5 MG PO TABS: 5.0000 mg | ORAL_TABLET | ORAL | Status: DC | PRN

## 2017-08-05 MED ORDER — SODIUM CHLORIDE 0.9 % IV SOLN
1.5000 g | Freq: Three times a day (TID) | INTRAVENOUS | Status: DC
  Filled 2017-08-05 (×3): qty 1.5

## 2017-08-05 MED ORDER — SODIUM CHLORIDE 0.9 % IV SOLN
Freq: Once | INTRAVENOUS | Status: AC
  Administered 2017-08-05: 12:00:00 via INTRAVENOUS

## 2017-08-05 MED ORDER — SODIUM CHLORIDE 0.9% FLUSH
3.0000 mL | Freq: Two times a day (BID) | INTRAVENOUS | Status: DC
  Administered 2017-08-06 – 2017-08-08 (×5): 3 mL via INTRAVENOUS

## 2017-08-05 MED ORDER — MORPHINE SULFATE (PF) 4 MG/ML IV SOLN: 1.0000 mg | INTRAVENOUS | Status: DC | PRN

## 2017-08-05 MED ORDER — VITAMIN C 500 MG PO TABS
500.0000 mg | ORAL_TABLET | Freq: Every day | ORAL | Status: DC
  Administered 2017-08-05 – 2017-08-08 (×4): 500 mg via ORAL
  Filled 2017-08-05 (×4): qty 1

## 2017-08-05 MED ORDER — DOCUSATE SODIUM 100 MG PO CAPS
100.0000 mg | ORAL_CAPSULE | Freq: Every day | ORAL | Status: DC
  Administered 2017-08-05 – 2017-08-08 (×4): 100 mg via ORAL
  Filled 2017-08-05 (×4): qty 1

## 2017-08-05 MED ORDER — ACETAMINOPHEN 650 MG RE SUPP: 650.0000 mg | Freq: Four times a day (QID) | RECTAL | Status: DC | PRN

## 2017-08-05 MED ORDER — ADULT MULTIVITAMIN W/MINERALS CH
1.0000 | ORAL_TABLET | Freq: Every day | ORAL | Status: DC
  Administered 2017-08-05 – 2017-08-08 (×4): 1 via ORAL
  Filled 2017-08-05 (×4): qty 1

## 2017-08-05 MED ORDER — ONDANSETRON HCL 4 MG PO TABS: 4.0000 mg | ORAL_TABLET | Freq: Four times a day (QID) | ORAL | Status: DC | PRN

## 2017-08-05 MED ORDER — CALCIUM CARBONATE 1500 (600 CA) MG PO TABS
600.0000 mg | ORAL_TABLET | Freq: Every day | ORAL | Status: DC
  Filled 2017-08-05: qty 1

## 2017-08-05 MED ORDER — PIPERACILLIN-TAZOBACTAM 3.375 G IVPB: 3.3750 g | Freq: Three times a day (TID) | INTRAVENOUS | Status: DC

## 2017-08-05 MED ORDER — LORATADINE 10 MG PO TABS
10.0000 mg | ORAL_TABLET | Freq: Every day | ORAL | Status: DC
  Administered 2017-08-05 – 2017-08-08 (×4): 10 mg via ORAL
  Filled 2017-08-05 (×4): qty 1

## 2017-08-05 MED ORDER — ACETAMINOPHEN 325 MG PO TABS: 650.0000 mg | ORAL_TABLET | Freq: Four times a day (QID) | ORAL | Status: DC | PRN

## 2017-08-05 MED ORDER — VANCOMYCIN HCL IN DEXTROSE 1-5 GM/200ML-% IV SOLN: 1000.0000 mg | INTRAVENOUS | Status: DC

## 2017-08-05 NOTE — ED Provider Notes (Signed)
[facility] EMERGENCY DEPARTMENT Provider Note   CSN: [id number] Arrival date & time: 08/05/17  0734     History   Chief Complaint Chief Complaint  Patient presents with  . Follow-up    HPI [name] [name] is a 84 y.o. female who presents the emergency department for wound check.  Patient was seen in the emergency department on 08/04/2017 after being bitten and scratched on the right hand and forearm by her cat.  The patient has taken 3 doses of Augmentin and returns for reevaluation.  She denies any significant pain but does notice that her thumb is more swollen.  She states she thinks that she may have jammed the thumb when she fell after being attacked by the cat.  She had negative imaging.  She denies fevers, chills or pain.  HPI  Past Medical History:  Diagnosis Date  . ALLERGIC RHINITIS 11/02/2006  . CKD (chronic kidney disease), stage III ([employer])   . HYPERLIPIDEMIA 11/04/2006  . HYPOTHYROIDISM, POSTSURGICAL 03/28/2007  . Malignant neoplasm of thyroid gland ([place]) 03/28/2007   5/07: righte lobectomy--path shows 3 cm follicular ca 0/73: completion left lobectomy, no more tumor seen 09/07: thyroglobulin 0.2 9ab neg) 11/07: i-131 rx, 101 mci 6/08: thyroglogulin 0.2 (ab neg) , hypothyroid body scan neg (clinically tolerated hypothyroidism poorly) 06/09: tg undetectable (ab=1.5, normal) 12/10: tg undetectable (ab neg) 6/11: tg undectable (ab neg)  . OSTEOPENIA 11/02/2006    Patient Active Problem List   Diagnosis Date Noted  . Infected cat bite of forearm 08/05/2017  . Hypothyroidism 08/05/2017  . HLD (hyperlipidemia) 08/05/2017  . CKD (chronic kidney disease), stage III ([place]) 08/05/2017  . Numbness 11/19/2014  . Distal radius fracture 01/14/2014  . Malignant neoplasm of thyroid gland ([place]) 03/28/2007  . HYPOTHYROIDISM, POSTSURGICAL 03/28/2007  . HYPOPARATHYROIDISM 03/28/2007  . Dyslipidemia 11/04/2006  . ALLERGIC RHINITIS 11/02/2006  . OSTEOPENIA 11/02/2006     Past Surgical History:  Procedure Laterality Date  . ABDOMINAL HYSTERECTOMY    . CERVICAL POLYPECTOMY    . OPEN REDUCTION INTERNAL FIXATION (ORIF) DISTAL RADIAL FRACTURE Left 01/14/2014   Procedure: OPEN REDUCTION INTERNAL FIXATION DISTAL RADIAL FRACTURE;  Surgeon: Linna Hoff, MD;  Location: [facility];  Service: Orthopedics;  Laterality: Left;     OB History   None      Home Medications    Prior to Admission medications   Medication Sig Start Date End Date Taking? Authorizing Provider  amoxicillin-clavulanate (AUGMENTIN) 875-125 MG tablet Take 1 tablet by mouth 2 (two) times daily. One po bid x 7 days 08/04/17  Yes Muthersbaugh, Jarrett Soho, PA-C  calcitRIOL (ROCALTROL) 0.25 MCG capsule TAKE 1 CAPSULE BY MOUTH THREE TIMES A WEEK 01/24/17  Yes Renato Shin, MD  calcium carbonate (OS-CAL) 600 MG TABS Take 600 mg by mouth daily.    Yes [provider]  cetirizine (ZYRTEC) 10 MG tablet Take 10 mg by mouth daily.   Yes [provider]  docusate sodium (COLACE) 100 MG capsule Take 100 mg by mouth daily.   Yes [provider]  levothyroxine (SYNTHROID, LEVOTHROID) 100 MCG tablet Take 1 tablet (100 mcg total) by mouth daily before breakfast. 11/21/16  Yes Renato Shin, MD  Menthol-Ascorbic Acid (LUDENS COUGH DROPS MT) Use as directed 1 drop in the mouth or throat as needed (dry throat).   Yes [provider]  Multiple Vitamins-Minerals (MULTIVITAMINS THER. W/MINERALS) TABS Take 1 tablet by mouth daily.     Yes [provider]  vitamin C (  ASCORBIC ACID) 500 MG tablet Take 1 tablet (500 mg total) by mouth daily. 01/14/14  Yes Iran Planas, MD    Family History Family History  Problem Relation Age of Onset  . Diabetes Sister     Social History Social History   Tobacco Use  . Smoking status: Never Smoker  . Smokeless tobacco: Never Used  Substance Use Topics  . Alcohol use: Not on file  . Drug use: Not on file     Allergies   Patient  has no known allergies.   Review of Systems Review of Systems  Ten systems reviewed and are negative for acute change, except as noted in the HPI.   Physical Exam Updated Vital Signs BP (!) 149/76 (BP Location: Left Arm)   Pulse 83   Temp (!) 97.5 F (36.4 C) (Oral)   Resp 20   SpO2 98%   Physical Exam Physical Exam  Nursing note and vitals reviewed. Constitutional: She is oriented to person, place, and time. She appears well-developed and well-nourished. No distress.  HENT:  Head: Normocephalic and atraumatic.  Eyes: Conjunctivae normal and EOM are normal. Pupils are equal, round, and reactive to light. No scleral icterus.  Neck: Normal range of motion.  Cardiovascular: Normal rate, regular rhythm and normal heart sounds.  Exam reveals no gallop and no friction rub.   No murmur heard. Pulmonary/Chest: Effort normal and breath sounds normal. No respiratory distress.  Abdominal: Soft. Bowel sounds are normal. She exhibits no distension and no mass. There is no tenderness. There is no guarding.  Musculoskeletal: Right forearm and hand examined show significant swelling in the right thumb.  No tenderness to palpation, range motion is limited due to swelling but there is no pain elicited with passive or active range of motion. Neurological: She is alert and oriented to person, place, and time.  Skin: Skin is warm and dry. She is not diaphoretic. Right forearm has increasing erythema from previous day.  There is a bite wound to the lateral forearm which easily expresses purulence from the bite wound.          ED Treatments / Results  Labs (all labs ordered are listed, but only abnormal results are displayed) Labs Reviewed  BASIC METABOLIC PANEL - Abnormal; Notable for the following components:      Result Value   Glucose, Bld 135 (*)    Creatinine, Ser 1.05 (*)    Calcium 7.7 (*)    GFR calc non Af Amer 46 (*)    GFR calc Af Amer 54 (*)    All other components within  normal limits  SEDIMENTATION RATE - Abnormal; Notable for the following components:   Sed Rate 50 (*)    All other components within normal limits  AEROBIC/ANAEROBIC CULTURE (SURGICAL/DEEP WOUND)  CULTURE, BLOOD (ROUTINE X 2)  CULTURE, BLOOD (ROUTINE X 2)  CBC WITH DIFFERENTIAL/PLATELET  LACTIC ACID, PLASMA  LACTIC ACID, PLASMA    EKG None  Radiology Dg Forearm Right  Result Date: 08/04/2017 CLINICAL DATA:  Patient was bit by her cat this morning after she fell down. Patient has multiple scratches and bites to right forearm and is also complains of right thumb pain EXAM: RIGHT FOREARM - 2 VIEW COMPARISON:  None. FINDINGS: Radius and ulna appear intact. No acute fracture or dislocation. Mild ulna minus. Degenerative changes in the carpus. Soft tissues are unremarkable. No radiopaque soft tissue foreign bodies. IMPRESSION: No acute bony abnormalities. No radiopaque soft tissue foreign bodies. Electronically Signed  By: Lucienne Capers M.D.   On: 08/04/2017 06:23   Dg Finger Thumb Right  Result Date: 08/04/2017 CLINICAL DATA:  Patient was bit by her cat this morning after she fell down. Patient has multiple scratches and bites to right forearm and is also complains of right thumb pain EXAM: RIGHT THUMB 2+V COMPARISON:  Right hand 10/01/2016 FINDINGS: Degenerative changes in the first metacarpal phalangeal and interphalangeal joints. No evidence of acute fracture or dislocation. No focal bone lesion or bone destruction. Soft tissue swelling over the metacarpal phalangeal joint. No radiopaque soft tissue foreign bodies or gas collections. IMPRESSION: Degenerative changes in the right first finger. Soft tissue swelling over the metacarpal phalangeal joint. No acute bony abnormalities. No radiopaque foreign bodies. Electronically Signed   By: Lucienne Capers M.D.   On: 08/04/2017 06:24    Procedures Procedures (including critical care time)  Medications Ordered in ED Medications  oxyCODONE  (Oxy IR/ROXICODONE) immediate release tablet 5 mg (has no administration in time range)  morphine 4 MG/ML injection 1-4 mg (has no administration in time range)  calcitRIOL (ROCALTROL) capsule 0.25 mcg (has no administration in time range)  calcium carbonate (OSCAL) tablet 600 mg (has no administration in time range)  loratadine (CLARITIN) tablet 10 mg (10 mg Oral Given 08/05/17 1514)  docusate sodium (COLACE) capsule 100 mg (100 mg Oral Given 08/05/17 1514)  levothyroxine (SYNTHROID, LEVOTHROID) tablet 100 mcg (has no administration in time range)  multivitamin with minerals tablet 1 tablet (1 tablet Oral Given 08/05/17 1514)  vitamin C (ASCORBIC ACID) tablet 500 mg (500 mg Oral Given 08/05/17 1514)  sodium chloride flush (NS) 0.9 % injection 3 mL (3 mLs Intravenous Not Given 08/05/17 1230)  acetaminophen (TYLENOL) tablet 650 mg (has no administration in time range)    Or  acetaminophen (TYLENOL) suppository 650 mg (has no administration in time range)  ondansetron (ZOFRAN) tablet 4 mg (has no administration in time range)    Or  ondansetron (ZOFRAN) injection 4 mg (has no administration in time range)  ampicillin-sulbactam (UNASYN) 1.5 g in sodium chloride 0.9 % 100 mL IVPB (has no administration in time range)  0.9 %  sodium chloride infusion ( Intravenous New Bag/Given 08/05/17 1149)  vancomycin (VANCOCIN) 1,250 mg in sodium chloride 0.9 % 250 mL IVPB (0 mg Intravenous Stopped 08/05/17 1520)  piperacillin-tazobactam (ZOSYN) IVPB 3.375 g (0 g Intravenous Stopped 08/05/17 1219)     Initial Impression / Assessment and Plan / ED Course  I have reviewed the triage vital signs and the nursing notes.  Pertinent labs & imaging results that were available during my care of the patient were reviewed by me and considered in my medical decision making (see chart for details).  Clinical Course as of Aug 06 1602  Sun Aug 05, 2017  1207 Spoke with Dr. Doreatha Martin who will assess the patient.    [AH]     Clinical Course User Index [AH] Margarita Mail, PA-C    Patient seen and shared visit with Dr. Ellender Hose.  She has failed outpatient antibiotics and has expressible purulence from the cat bite to the right forearm.  We have started an IV in her beginning Vanco and Zosyn.  Have placed a consult to the hand surgeon.  She will need admission for antibiotics. Final Clinical Impressions(s) / ED Diagnoses   Final diagnoses:  Cat bite of right forearm, initial encounter  Abscess    ED Discharge Orders    None       Trevin Gartrell,  Maryjane Hurter 08/05/17 1604    Duffy Bruce, MD 08/06/17 [phone]

## 2017-08-05 NOTE — Progress Notes (Signed)
Pharmacy Antibiotic Note  [name] [name] is a 84 y.o. female admitted on 08/05/2017 with cellulitisafter cat bite.  Pharmacy has been consulted for Unasynn dosing.  Given 1 time doses of vancomycin and Zosyn in the ED. Recent cat bite- was given prescription for Augmentin and only took for ~1 day, but bite worsened.  Plan: Unasyn 1.5g IV q8h Follow renal function, ability to transition to PO Augmentin   Temp (24hrs), Avg:97.9 F (36.6 C), Min:97.7 F (36.5 C), Max:98.1 F (36.7 C)  Recent Labs  Lab 08/05/17 0814  WBC 8.9  CREATININE 1.05*    Estimated Creatinine Clearance: 35.8 mL/min (A) (by C-G formula based on SCr of 1.05 mg/dL (H)).    No Known Allergies  Vanc 4/28 x1 Zosyn 4/28 x1 Unasyn 4/28>>  4/28 BCx:  Thank you for allowing pharmacy to be a part of this patient's care. Dorris Vangorder D. Ladonne Sharples, PharmD, BCPS Clinical Pharmacist Clinical Phone for 08/05/2017 until 3:30pm: [phone] If after 3:30pm, please call main pharmacy at [phone] 08/05/2017 12:36 PM

## 2017-08-05 NOTE — Progress Notes (Addendum)
Pharmacy Antibiotic Note  [name] [name] is a 84 y.o. female admitted on 08/05/2017 with cellulitis.  Pharmacy has been consulted for vancomycin and zosyn dosing.  Was attacked by cat recently and given prescription for Augmentin. Has not had a full day of abx yet, but came back and the bite has significantly worsened. MD would like to cover broadly for now.  Plan: Give vancomycin 1.25g IV x 1, then start vancomycin 1g IV Q24h Start Zosyn 3.375 gm IV q8h (4 hour infusion) Monitor clinical picture, renal function, VT prn F/U C&S, abx deescalation / LOT  F/U de-escalation to Unasyn or Augmentin if improves rapidly     Temp (24hrs), Avg:97.7 F (36.5 C), Min:97.7 F (36.5 C), Max:97.7 F (36.5 C)  No results for input(s): WBC, CREATININE, LATICACIDVEN, VANCOTROUGH, VANCOPEAK, VANCORANDOM, GENTTROUGH, GENTPEAK, GENTRANDOM, TOBRATROUGH, TOBRAPEAK, TOBRARND, AMIKACINPEAK, AMIKACINTROU, AMIKACIN in the last 168 hours.  CrCl cannot be calculated (Patient's most recent lab result is older than the maximum 21 days allowed.).    No Known Allergies  Thank you for allowing pharmacy to be a part of this patient's care.  Reginia Naas 08/05/2017 8:28 AM

## 2017-08-05 NOTE — ED Notes (Signed)
Pt to be escorted to 5W via w/c. Son w/pt.

## 2017-08-05 NOTE — ED Notes (Signed)
Provider collected only aerobic sample in triage, once back to a room, a red top wound sample needs to be sent to the microlab.

## 2017-08-05 NOTE — ED Triage Notes (Signed)
Pt to ER states, for dressing change of cat bite that occurred 24 hours ago, was seen yesterday here and given prescriptions. States is compliant with those. Denies pain. States was told to come back here today for a dressing change.

## 2017-08-05 NOTE — Consult Note (Signed)
[facility] (OTS) Consult   Patient ID: [name] [name] MRN: [id number] DOB/AGE: [id number] 82 y.o.  Reason for Consult:Right forearm cat bite Referring Physician: Dr. Myrene Buddy, MD [facility]  HPI: [name] [name] is an 84 y.o. female who is being seen in consultation at the request of Dr. Ellender Hose for right forearm infection.  The patient was seen in the emergency department on 427 when she had a scratch on her right hand and forearm by her cat.  She was placed on Augmentin and she had 3 doses but the redness and some mild drainage occurred and she presented back to the emergency room for recheck.  Due to the increased redness and erythema hand surgery was consulted for recommendations regarding treatment.  She denies any fevers or chills.  Denies any pain with movement of the wrist elbow or wrist.    Past Medical History:  Diagnosis Date  . ALLERGIC RHINITIS 11/02/2006  . CKD (chronic kidney disease), stage III ([place])   . HYPERLIPIDEMIA 11/04/2006  . HYPOTHYROIDISM, POSTSURGICAL 03/28/2007  . Malignant neoplasm of thyroid gland ([place]) 03/28/2007   5/07: righte lobectomy--path shows 3 cm follicular ca 8/33: completion left lobectomy, no more tumor seen 09/07: thyroglobulin 0.2 9ab neg) 11/07: i-131 rx, [facility] 6/08: thyroglogulin 0.2 (ab neg) , hypothyroid body scan neg (clinically tolerated hypothyroidism poorly) 06/09: tg undetectable (ab=1.5, normal) 12/10: tg undetectable (ab neg) 6/11: tg undectable (ab neg)  . OSTEOPENIA 11/02/2006    Past Surgical History:  Procedure Laterality Date  . ABDOMINAL HYSTERECTOMY    . CERVICAL POLYPECTOMY    . OPEN REDUCTION INTERNAL FIXATION (ORIF) DISTAL RADIAL FRACTURE Left 01/14/2014   Procedure: OPEN REDUCTION INTERNAL FIXATION DISTAL RADIAL FRACTURE;  Surgeon: Linna Hoff, MD;  Location: [place];  Service: Orthopedics;  Laterality: Left;    Family History  Problem Relation Age of Onset  . Diabetes Sister     Social History:  reports  that she has never smoked. She has never used smokeless tobacco. Her alcohol and drug histories are not on file.  Allergies: No Known Allergies  Medications:  No current facility-administered medications on file prior to encounter.    Current Outpatient Medications on File Prior to Encounter  Medication Sig Dispense Refill  . amoxicillin-clavulanate (AUGMENTIN) 875-125 MG tablet Take 1 tablet by mouth 2 (two) times daily. One po bid x 7 days 14 tablet 0  . calcitRIOL (ROCALTROL) 0.25 MCG capsule TAKE 1 CAPSULE BY MOUTH THREE TIMES A WEEK 13 capsule 2  . calcium carbonate (OS-CAL) 600 MG TABS Take 600 mg by mouth daily.     . cetirizine (ZYRTEC) 10 MG tablet Take 10 mg by mouth daily.    Marland Kitchen docusate sodium (COLACE) 100 MG capsule Take 100 mg by mouth daily.    Marland Kitchen levothyroxine (SYNTHROID, LEVOTHROID) 100 MCG tablet Take 1 tablet (100 mcg total) by mouth daily before breakfast. 90 tablet 3  . Menthol-Ascorbic Acid (LUDENS COUGH DROPS MT) Use as directed 1 drop in the mouth or throat as needed (dry throat).    . Multiple Vitamins-Minerals (MULTIVITAMINS THER. W/MINERALS) TABS Take 1 tablet by mouth daily.      . vitamin C (ASCORBIC ACID) 500 MG tablet Take 1 tablet (500 mg total) by mouth daily. 50 tablet 0    ROS: Constitutional: No fever or chills Vision: No changes in vision ENT: No difficulty swallowing CV: No chest pain Pulm: No SOB or wheezing GI: No nausea or vomiting GU: No urgency  or inability to hold urine Skin: No poor wound healing Neurologic: No numbness or tingling Psychiatric: No depression or anxiety Heme: No bruising Allergic: No reaction to medications or food   Exam: Blood pressure 130/66, pulse 61, temperature 98.1 F (36.7 C), temperature source Oral, resp. rate 16, SpO2 92 %. General:NAD Orientation:AAOx3 Mood and Affect: Cooperative and pleasant Gait: WNL Coordination and balance: Within normal limits  Right upper extremity: Reveals some small punctate  lesions over the right forearm.  Mild erythema.  No active fluctuance or significant drainage.  Passive range of motion of the wrist and elbows without pain.  Compartments are soft and compressible.  She is motor and sensory function intact median radial and ulnar nerve distribution.  Warm well-perfused hand with 2+ radial pulse.  Left upper extremity: Skin without lesions. No tenderness to palpation. Full painless ROM, full strength in each muscle groups without evidence of instability.   Medical Decision Making: Imaging: No imaging  Labs:  CBC    Component Value Date/Time   WBC 8.9 08/05/2017 0814   RBC 4.53 08/05/2017 0814   HGB 12.1 08/05/2017 0814   HCT 38.8 08/05/2017 0814   PLT 337 08/05/2017 0814   MCV 85.7 08/05/2017 0814   MCH 26.7 08/05/2017 0814   MCHC 31.2 08/05/2017 0814   RDW 14.0 08/05/2017 0814   LYMPHSABS 1.8 08/05/2017 0814   MONOABS 0.7 08/05/2017 0814   EOSABS 0.1 08/05/2017 0814   BASOSABS 0.0 08/05/2017 0814    Medical history and chart was reviewed  Assessment/Plan: 84 year old female status post cat scratch with some increased cellulitis and erythema.  Do not feel that there is certain any surgical indication at this point.  I recommend a short course of IV antibiotics with transition to p.o. antibiotics.  Will defer to the medical team regarding antibiotic therapy.  She may follow-up with me on an as-needed basis.  Please contact me if she does not improve with the IV antibiotics.  Shona Needles, MD [facility] [phone] (phone)

## 2017-08-05 NOTE — ED Notes (Signed)
Family member visiting w/pt and brought pt food.

## 2017-08-05 NOTE — ED Notes (Signed)
Supervising Admitting MD in w/pt.

## 2017-08-05 NOTE — H&P (Signed)
History and Physical    [name] [name]:[id number] DOB: 1927/06/15 DOA: 08/05/2017  **Will admit patient based on the expectation that the patient will need hospitalization/ hospital care that crosses at least 2 midnights  PCP: Marletta Lor, MD   Attending physician: Jamse Arn  Patient coming from/Resides with: Private residence/alone  Chief Complaint: Failed outpatient antibiotic therapy for cat bite  HPI: [name] [name] is a 84 y.o. female with medical history significant for dyslipidemia, osteoporosis on medical therapy, chronic kidney disease, and hypothyroidism.  Patient sustained a cat bite to right forearm and received treatment in the ER on 4/27.  Plain x-rays were negative.  She was prescribed Augmentin.  He is taken 3 doses.  She return for wound check.  Wound appeared to be infected.  Hand surgeon consulted and recommended admission for IV antibiotics.  ED Course:  Vital Signs: BP (!) 141/87   Pulse 79   Temp 98.1 F (36.7 C) (Oral)   Resp 16   SpO2 96%  Lab data: 136, potassium 3.8, chloride 101, CO2 26, glucose 135, BUN 19, creatinine 1.05, calcium 7.7, anion gap 9, white count 8900 with normal differential in the context of recent antibiotics, hemoglobin 12.1, platelets 337,000; cultures obtained in the ER Medications and treatments: Vancomycin 1250 mg IV x1, Zosyn 3.375 g IV x1  Review of Systems:  In addition to the HPI above,  No Fever-chills, myalgias or other constitutional symptoms No Headache, changes with Vision or hearing, new weakness, tingling, numbness in any extremity, dizziness, dysarthria or word finding difficulty, gait disturbance or imbalance, tremors or seizure activity No problems swallowing food or Liquids, indigestion/reflux, choking or coughing while eating, abdominal pain with or after eating No Chest pain, Cough or Shortness of Breath, palpitations, orthopnea or DOE No Abdominal pain, N/V, melena,hematochezia, dark tarry stools,  constipation No dysuria, malodorous urine, hematuria or flank pain No new skin rashes, masses or bruises, No new joint pains, aches, swelling or redness No recent unintentional weight gain or loss No polyuria, polydypsia or polyphagia   Past Medical History:  Diagnosis Date  . ALLERGIC RHINITIS 11/02/2006  . CKD (chronic kidney disease), stage III ([place])   . HYPERLIPIDEMIA 11/04/2006  . HYPOTHYROIDISM, POSTSURGICAL 03/28/2007  . Malignant neoplasm of thyroid gland ([place]) 03/28/2007   5/07: righte lobectomy--path shows 3 cm follicular ca 6/50: completion left lobectomy, no more tumor seen 09/07: thyroglobulin 0.2 9ab neg) 11/07: i-131 rx, 101 [facility] 6/08: thyroglogulin 0.2 (ab neg) , hypothyroid body scan neg (clinically tolerated hypothyroidism poorly) 06/09: tg undetectable (ab=1.5, normal) 12/10: tg undetectable (ab neg) 6/11: tg undectable (ab neg)  . OSTEOPENIA 11/02/2006    Past Surgical History:  Procedure Laterality Date  . ABDOMINAL HYSTERECTOMY    . CERVICAL POLYPECTOMY    . OPEN REDUCTION INTERNAL FIXATION (ORIF) DISTAL RADIAL FRACTURE Left 01/14/2014   Procedure: OPEN REDUCTION INTERNAL FIXATION DISTAL RADIAL FRACTURE;  Surgeon: Linna Hoff, MD;  Location: [place];  Service: Orthopedics;  Laterality: Left;    Social History   Socioeconomic History  . Marital status: Married    Spouse name: Not on file  . Number of children: Not on file  . Years of education: Not on file  . Highest education level: Not on file  Occupational History  . Not on file  Social Needs  . Financial resource strain: Not on file  . Food insecurity:    Worry: Not on file    Inability: Not on file  . Transportation needs:  Medical: Not on file    Non-medical: Not on file  Tobacco Use  . Smoking status: Never Smoker  . Smokeless tobacco: Never Used  Substance and Sexual Activity  . Alcohol use: Not on file  . Drug use: Not on file  . Sexual activity: Not on file  Lifestyle  . Physical  activity:    Days per week: Not on file    Minutes per session: Not on file  . Stress: Not on file  Relationships  . Social connections:    Talks on phone: Not on file    Gets together: Not on file    Attends religious service: Not on file    Active member of club or organization: Not on file    Attends meetings of clubs or organizations: Not on file    Relationship status: Not on file  . Intimate partner violence:    Fear of current or ex partner: Not on file    Emotionally abused: Not on file    Physically abused: Not on file    Forced sexual activity: Not on file  Other Topics Concern  . Not on file  Social History Narrative   Doesn't drive    Mobility: Occasionally utilizes a cane Work history: Not obtained   No Known Allergies  Family History  Problem Relation Age of Onset  . Diabetes Sister      Prior to Admission medications   Medication Sig Start Date End Date Taking? Authorizing Provider  amoxicillin-clavulanate (AUGMENTIN) 875-125 MG tablet Take 1 tablet by mouth 2 (two) times daily. One po bid x 7 days 08/04/17   Muthersbaugh, Jarrett Soho, PA-C  calcitRIOL (ROCALTROL) 0.25 MCG capsule TAKE 1 CAPSULE BY MOUTH THREE TIMES A WEEK 01/24/17   Renato Shin, MD  calcium carbonate (OS-CAL) 600 MG TABS Take 600 mg by mouth daily.     [provider]  cetirizine (ZYRTEC) 10 MG tablet Take 10 mg by mouth daily.    [provider]  docusate sodium (COLACE) 100 MG capsule Take 100 mg by mouth daily.    [provider]  levothyroxine (SYNTHROID, LEVOTHROID) 100 MCG tablet Take 1 tablet (100 mcg total) by mouth daily before breakfast. 11/21/16   Renato Shin, MD  Multiple Vitamins-Minerals (MULTIVITAMINS THER. W/MINERALS) TABS Take 1 tablet by mouth daily.      [provider]  vitamin C (ASCORBIC ACID) 500 MG tablet Take 1 tablet (500 mg total) by mouth daily. 01/14/14   Iran Planas, MD    Physical Exam: Vitals:   08/05/17 0742 08/05/17  0957 08/05/17 1143 08/05/17 1200  BP: 131/69 111/70 (!) 146/75 (!) 141/87  Pulse: 89 73 65 79  Resp: 18 18 16 16   Temp: 97.7 F (36.5 C) 98.1 F (36.7 C)    TempSrc: Oral Oral    SpO2: 99% 96% 98% 96%      Constitutional: NAD, calm, comfortable Eyes: PERRL, lids and conjunctivae normal ENMT: Mucous membranes are moist. Posterior pharynx clear of any exudate or lesions.Normal dentition.  Neck: normal, supple, no masses, no thyromegaly Respiratory: clear to auscultation bilaterally, no wheezing, no crackles. Normal respiratory effort. No accessory muscle use.  Cardiovascular: Regular rate and rhythm, no murmurs / rubs / gallops. No extremity edema. 2+ pedal pulses. No carotid bruits.  Abdomen: no tenderness, no masses palpated. No hepatosplenomegaly. Bowel sounds positive.  Musculoskeletal: no clubbing / cyanosis. No joint deformity upper and lower extremities. Good ROM, no contractures. Normal muscle tone.  Skin: no rashes,  multiple puncture wounds involving the right forearm sparing the hand.  Deep puncture wound mid forearm/dorsal side with small amount of yellow purulent drainage.  Diffuse erythema involving the mid forearm extending to the hand with minimal edema. Neurologic: CN 2-12 grossly intact. Sensation intact, DTR normal. Strength 5/5 x all 4 extremities.  Psychiatric: Normal judgment and insight. Alert and oriented x 3. Normal mood.         Labs on Admission: I have personally reviewed following labs and imaging studies  CBC: Recent Labs  Lab 08/05/17 0814  WBC 8.9  NEUTROABS 6.3  HGB 12.1  HCT 38.8  MCV 85.7  PLT 638   Basic Metabolic Panel: Recent Labs  Lab 08/05/17 0814  NA 136  K 3.8  CL 101  CO2 26  GLUCOSE 135*  BUN 19  CREATININE 1.05*  CALCIUM 7.7*   GFR: Estimated Creatinine Clearance: 35.8 mL/min (A) (by C-G formula based on SCr of 1.05 mg/dL (H)). Liver Function Tests: No results for input(s): AST, ALT, ALKPHOS, BILITOT, PROT, ALBUMIN  in the last 168 hours. No results for input(s): LIPASE, AMYLASE in the last 168 hours. No results for input(s): AMMONIA in the last 168 hours. Coagulation Profile: No results for input(s): INR, PROTIME in the last 168 hours. Cardiac Enzymes: No results for input(s): CKTOTAL, CKMB, CKMBINDEX, TROPONINI in the last 168 hours. BNP (last 3 results) No results for input(s): PROBNP in the last 8760 hours. HbA1C: No results for input(s): HGBA1C in the last 72 hours. CBG: No results for input(s): GLUCAP in the last 168 hours. Lipid Profile: No results for input(s): CHOL, HDL, LDLCALC, TRIG, CHOLHDL, LDLDIRECT in the last 72 hours. Thyroid Function Tests: No results for input(s): TSH, T4TOTAL, FREET4, T3FREE, THYROIDAB in the last 72 hours. Anemia Panel: No results for input(s): VITAMINB12, FOLATE, FERRITIN, TIBC, IRON, RETICCTPCT in the last 72 hours. Urine analysis:    Component Value Date/Time   COLORURINE AMBER (A) 03/13/2011 0011   APPEARANCEUR CLOUDY (A) 03/13/2011 0011   LABSPEC 1.027 03/13/2011 0011   PHURINE 5.5 03/13/2011 0011   GLUCOSEU NEGATIVE 03/13/2011 0011   HGBUR SMALL (A) 03/13/2011 0011   BILIRUBINUR SMALL (A) 03/13/2011 0011   KETONESUR 15 (A) 03/13/2011 0011   PROTEINUR 100 (A) 03/13/2011 0011   UROBILINOGEN 1.0 03/13/2011 0011   NITRITE NEGATIVE 03/13/2011 0011   LEUKOCYTESUR SMALL (A) 03/13/2011 0011   Sepsis Labs: @LABRCNTIP (procalcitonin:4,lacticidven:4) )No results found for this or any previous visit (from the past 240 hour(s)).   Radiological Exams on Admission: Dg Forearm Right  Result Date: 08/04/2017 CLINICAL DATA:  Patient was bit by her cat this morning after she fell down. Patient has multiple scratches and bites to right forearm and is also complains of right thumb pain EXAM: RIGHT FOREARM - 2 VIEW COMPARISON:  None. FINDINGS: Radius and ulna appear intact. No acute fracture or dislocation. Mild ulna minus. Degenerative changes in the carpus. Soft  tissues are unremarkable. No radiopaque soft tissue foreign bodies. IMPRESSION: No acute bony abnormalities. No radiopaque soft tissue foreign bodies. Electronically Signed   By: Lucienne Capers M.D.   On: 08/04/2017 06:23   Dg Finger Thumb Right  Result Date: 08/04/2017 CLINICAL DATA:  Patient was bit by her cat this morning after she fell down. Patient has multiple scratches and bites to right forearm and is also complains of right thumb pain EXAM: RIGHT THUMB 2+V COMPARISON:  Right hand 10/01/2016 FINDINGS: Degenerative changes in the first metacarpal phalangeal and interphalangeal joints. No evidence  of acute fracture or dislocation. No focal bone lesion or bone destruction. Soft tissue swelling over the metacarpal phalangeal joint. No radiopaque soft tissue foreign bodies or gas collections. IMPRESSION: Degenerative changes in the right first finger. Soft tissue swelling over the metacarpal phalangeal joint. No acute bony abnormalities. No radiopaque foreign bodies. Electronically Signed   By: Lucienne Capers M.D.   On: 08/04/2017 06:24    Assessment/Plan Principal Problem:   Infected cat bite of forearm -Patient returns to ER for wound check and was found to have signs consistent with infection secondary to cat bite sustained in the past 36 hours, failed outpatient therapy with oral Augmentin -Unasyn IV with pharmacy managing dosing -Follow-up on blood cultures obtained in the ER -EDP has consulted hand surgeon/Haddix who saw no indication for surgery intervention but recommended IV antibiotics -Tylenol for mild pain -OxyIR for moderate pain -Morphine IV for severe pain -Routine wound care  Active Problems:   CKD (chronic kidney disease), stage III  -Last available GFR in 2015 was 66 -Her GFR has decreased to 46 consistent with stage III kidney disease    Hypothyroidism -Continue Synthroid    HLD (hyperlipidemia) -Diet controlled    **Additional lab, imaging and/or diagnostic  evaluation at discretion of supervising physician  DVT prophylaxis: SCDs Code Status: Full Family Communication: No family at bedside Disposition Plan: Home Consults called: Hand surgery/Haddix    ELLIS,ALLISON L. ANP-BC [facility] Pager [phone]   If 7PM-7AM, please contact night-coverage [email or web address] Password [facility]  08/05/2017, 12:40 PM

## 2017-08-06 LAB — CBC
HCT: 36.7 % (ref 36.0–46.0)
Hemoglobin: 11.3 g/dL — ABNORMAL LOW (ref 12.0–15.0)
MCH: 26.3 pg (ref 26.0–34.0)
MCHC: 30.8 g/dL (ref 30.0–36.0)
MCV: 85.5 fL (ref 78.0–100.0)
PLATELETS: 310 10*3/uL (ref 150–400)
RBC: 4.29 MIL/uL (ref 3.87–5.11)
RDW: 14.2 % (ref 11.5–15.5)
WBC: 7.6 10*3/uL (ref 4.0–10.5)

## 2017-08-06 LAB — BASIC METABOLIC PANEL
Anion gap: 7 (ref 5–15)
BUN: 14 mg/dL (ref 6–20)
CO2: 26 mmol/L (ref 22–32)
CREATININE: 0.92 mg/dL (ref 0.44–1.00)
Calcium: 7 mg/dL — ABNORMAL LOW (ref 8.9–10.3)
Chloride: 105 mmol/L (ref 101–111)
GFR, EST NON AFRICAN AMERICAN: 54 mL/min — AB (ref >60)
Glucose, Bld: 104 mg/dL — ABNORMAL HIGH (ref 65–99)
POTASSIUM: 3.7 mmol/L (ref 3.5–5.1)
SODIUM: 138 mmol/L (ref 135–145)

## 2017-08-06 MED ORDER — SODIUM CHLORIDE 0.9% FLUSH
10.0000 mL | INTRAVENOUS | Status: DC | PRN
  Administered 2017-08-07: 20 mL
  Filled 2017-08-06: qty 40

## 2017-08-06 NOTE — Progress Notes (Signed)
PROGRESS NOTE    [name] [name]  WUJ:[id number] DOB: July 27, 1927 DOA: 08/05/2017 PCP: Marletta Lor, MD  Brief Narrative:84 y.o. female with medical history significant for dyslipidemia, osteoporosis on medical therapy, chronic kidney disease, and hypothyroidism.  Patient sustained a cat bite to right forearm and received treatment in the ER on 4/27.  Plain x-rays were negative.  She was prescribed Augmentin.  He is taken 3 doses.  She return for wound check.  Wound appeared to be infected.  Hand surgeon consulted and recommended admission for IV antibiotics.  ED Course:  Vital Signs: BP (!) 141/87   Pulse 79   Temp 98.1 F (36.7 C) (Oral)   Resp 16   SpO2 96%  Lab data: 136, potassium 3.8, chloride 101, CO2 26, glucose 135, BUN 19, creatinine 1.05, calcium 7.7, anion gap 9, white count 8900 with normal differential in the context of recent antibiotics, hemoglobin 12.1, platelets 337,000; cultures obtained in the ER Medications and treatments: Vancomycin 1250 mg IV x1, Zosyn 3.375 g IV x1    Assessment & Plan:   Principal Problem:   Infected cat bite of forearm Active Problems:   Hypothyroidism   HLD (hyperlipidemia)   CKD (chronic kidney disease), stage III (HCC)   Infected cat bite of forearm -, failed outpatient therapy with oral Augmentin -Unasyn IV with pharmacy managing dosing -Follow-up on blood cultures obtained in the ER -EDP has consulted hand surgeon/Haddix who saw no indication for surgery intervention but recommended IV antibiotics -Tylenol for mild pain -OxyIR for moderate pain -Morphine IV for severe pain -Routine wound care  Active Problems:   CKD (chronic kidney disease), stage III  -Last available GFR in 2015 was 66 -Her GFR has decreased to 46 consistent with stage III kidney disease    Hypothyroidism -Continue Synthroid    HLD (hyperlipidemia) -Diet controlled      DVT prophylaxis:scd Code Status full Family  Communication:none Disposition Plan:tbd Consultants: ortho  Procedures:none Antimicrobials: unasyn Subjective:feels better can move fingers.   Objective: Vitals:   08/05/17 2000 08/05/17 2149 08/06/17 0505 08/06/17 1533  BP:  (!) 141/90 (!) 144/79 (!) 154/99  Pulse:  79 74 90  Resp:  18 16 18   Temp:   97.9 F (36.6 C) 98 F (36.7 C)  TempSrc:   Oral Oral  SpO2:  94% 97% 94%  Weight: 68.2 kg (150 lb 7.1 oz)     Height: 5\' 4"  (1.626 m)       Intake/Output Summary (Last 24 hours) at 08/06/2017 1600 Last data filed at 08/05/2017 1744 Gross per 24 hour  Intake 100 ml  Output -  Net 100 ml   Filed Weights   08/05/17 2000  Weight: 68.2 kg (150 lb 7.1 oz)    Examination:  General exam: Appears calm and comfortable  Respiratory system: Clear to auscultation. Respiratory effort normal. Cardiovascular system: S1 & S2 heard, RRR. No JVD, murmurs, rubs, gallops or clicks. No pedal edema. Gastrointestinal system: Abdomen is nondistended, soft and nontender. No organomegaly or masses felt. Normal bowel sounds heard. Central nervous system: Alert and oriented. No focal neurological deficits. Extremities:RUE dressings on. Skin: No rashes, lesions or ulcers Psychiatry: Judgement and insight appear normal. Mood & affect appropriate.     Data Reviewed: I have personally reviewed following labs and imaging studies  CBC: Recent Labs  Lab 08/05/17 0814 08/06/17 0422  WBC 8.9 7.6  NEUTROABS 6.3  --   HGB 12.1 11.3*  HCT 38.8 36.7  MCV 85.7 85.5  PLT 337 631   Basic Metabolic Panel: Recent Labs  Lab 08/05/17 0814 08/06/17 0422  NA 136 138  K 3.8 3.7  CL 101 105  CO2 26 26  GLUCOSE 135* 104*  BUN 19 14  CREATININE 1.05* 0.92  CALCIUM 7.7* 7.0*   GFR: Estimated Creatinine Clearance: 40.9 mL/min (by C-G formula based on SCr of 0.92 mg/dL). Liver Function Tests: No results for input(s): AST, ALT, ALKPHOS, BILITOT, PROT, ALBUMIN in the last 168 hours. No results for  input(s): LIPASE, AMYLASE in the last 168 hours. No results for input(s): AMMONIA in the last 168 hours. Coagulation Profile: No results for input(s): INR, PROTIME in the last 168 hours. Cardiac Enzymes: No results for input(s): CKTOTAL, CKMB, CKMBINDEX, TROPONINI in the last 168 hours. BNP (last 3 results) No results for input(s): PROBNP in the last 8760 hours. HbA1C: No results for input(s): HGBA1C in the last 72 hours. CBG: No results for input(s): GLUCAP in the last 168 hours. Lipid Profile: No results for input(s): CHOL, HDL, LDLCALC, TRIG, CHOLHDL, LDLDIRECT in the last 72 hours. Thyroid Function Tests: No results for input(s): TSH, T4TOTAL, FREET4, T3FREE, THYROIDAB in the last 72 hours. Anemia Panel: No results for input(s): VITAMINB12, FOLATE, FERRITIN, TIBC, IRON, RETICCTPCT in the last 72 hours. Sepsis Labs: Recent Labs  Lab 08/05/17 1406 08/05/17 1710  LATICACIDVEN 0.6 1.1    Recent Results (from the past 240 hour(s))  Aerobic/Anaerobic Culture (surgical/deep wound)     Status: None (Preliminary result)   Collection Time: 08/05/17  8:11 AM  Result Value Ref Range Status   Specimen Description ARM  Final   Special Requests RIGHT  Final   Gram Stain   Final    RARE WBC PRESENT, PREDOMINANTLY PMN NO ORGANISMS SEEN    Culture   Final    NO GROWTH < 24 HOURS Performed at [facility], [place] [address], [place], [place] [address]    Report Status PENDING  Incomplete  Blood culture (routine x 2)     Status: None (Preliminary result)   Collection Time: 08/05/17  8:14 AM  Result Value Ref Range Status   Specimen Description BLOOD RIGHT HAND  Final   Special Requests   Final    IN BOTH AEROBIC AND ANAEROBIC BOTTLES Blood Culture adequate volume   Culture   Final    NO GROWTH 1 DAY Performed at [facility], [place] [address] [place], [place] [address]    Report Status PENDING  Incomplete  Blood culture (routine x 2)     Status: None (Preliminary result)    Collection Time: 08/05/17  8:16 AM  Result Value Ref Range Status   Specimen Description BLOOD RIGHT ANTECUBITAL  Final   Special Requests   Final    IN BOTH AEROBIC AND ANAEROBIC BOTTLES Blood Culture adequate volume   Culture   Final    NO GROWTH 1 DAY Performed at [facility], [place] [address] [place], [place] [address]    Report Status PENDING  Incomplete         Radiology Studies: No results found.      Scheduled Meds: . calcitRIOL  0.25 mcg Oral Once per day on Mon Wed Fri  . calcium carbonate  1 tablet Oral Q breakfast  . docusate sodium  100 mg Oral Daily  . levothyroxine  100 mcg Oral QAC breakfast  . loratadine  10 mg Oral Daily  . multivitamin with minerals  1 tablet Oral  Daily  . sodium chloride flush  3 mL Intravenous Q12H  . vitamin C  500 mg Oral Daily   Continuous Infusions: . ampicillin-sulbactam (UNASYN) IV Stopped (08/06/17 1423)     LOS: 1 day     Georgette Shell, MD [facility]  If 7PM-7AM, please contact night-coverage [email or web address] Password [email or web address] 08/06/2017, 4:00 PM

## 2017-08-07 LAB — CBC WITH DIFFERENTIAL/PLATELET
BASOS PCT: 1 %
Basophils Absolute: 0.1 10*3/uL (ref 0.0–0.1)
EOS ABS: 0.3 10*3/uL (ref 0.0–0.7)
Eosinophils Relative: 4 %
HEMATOCRIT: 36.9 % (ref 36.0–46.0)
HEMOGLOBIN: 11.3 g/dL — AB (ref 12.0–15.0)
Lymphocytes Relative: 23 %
Lymphs Abs: 2 10*3/uL (ref 0.7–4.0)
MCH: 26.4 pg (ref 26.0–34.0)
MCHC: 30.6 g/dL (ref 30.0–36.0)
MCV: 86.2 fL (ref 78.0–100.0)
Monocytes Absolute: 0.5 10*3/uL (ref 0.1–1.0)
Monocytes Relative: 6 %
NEUTROS ABS: 5.8 10*3/uL (ref 1.7–7.7)
Neutrophils Relative %: 66 %
Platelets: 318 10*3/uL (ref 150–400)
RBC: 4.28 MIL/uL (ref 3.87–5.11)
RDW: 14 % (ref 11.5–15.5)
WBC: 8.8 10*3/uL (ref 4.0–10.5)

## 2017-08-07 LAB — COMPREHENSIVE METABOLIC PANEL
ALBUMIN: 3 g/dL — AB (ref 3.5–5.0)
ALT: 19 U/L (ref 14–54)
AST: 23 U/L (ref 15–41)
Alkaline Phosphatase: 45 U/L (ref 38–126)
Anion gap: 7 (ref 5–15)
BILIRUBIN TOTAL: 0.3 mg/dL (ref 0.3–1.2)
BUN: 14 mg/dL (ref 6–20)
CO2: 28 mmol/L (ref 22–32)
CREATININE: 0.88 mg/dL (ref 0.44–1.00)
Calcium: 6.7 mg/dL — ABNORMAL LOW (ref 8.9–10.3)
Chloride: 105 mmol/L (ref 101–111)
GFR calc Af Amer: >60 mL/min (ref >60)
GFR calc non Af Amer: 57 mL/min — ABNORMAL LOW (ref >60)
Glucose, Bld: 96 mg/dL (ref 65–99)
Potassium: 3.8 mmol/L (ref 3.5–5.1)
SODIUM: 140 mmol/L (ref 135–145)
TOTAL PROTEIN: 6.8 g/dL (ref 6.5–8.1)

## 2017-08-07 NOTE — Progress Notes (Signed)
PROGRESS NOTE    [name] [name]  KKX:[id number] DOB: [birth year] DOA: 08/05/2017 PCP: Marletta Lor, MD  Brief Narrative: :84 y.o.femalewith medical history significant fordyslipidemia, osteoporosis on medical therapy, chronic kidney disease, and hypothyroidism. Patient sustained a cat bite to right forearm and received treatment in the ER on 4/27.Plain x-rays were negative. She was prescribed Augmentin.He is taken 3 doses. She return for wound check. Wound appeared to be infected. Hand surgeon consulted and recommended admission for IV antibiotics.  ED Course: Vital Signs:BP (!) 141/87  Pulse 79  Temp 98.1 F (36.7 C) (Oral)  Resp 16  SpO2 96%  Lab data:136, potassium 3.8, chloride 101, CO2 26, glucose 135, BUN 19, creatinine 1.05, calcium 7.7, anion gap 9, white count 8900 with normal differential in the context of recent antibiotics, hemoglobin 12.1, platelets 337,000;cultures obtained in the ER Medications and treatments:Vancomycin 1250 mg IV x1, Zosyn 3.375 g IV x1     Assessment & Plan:   Principal Problem:   Infected cat bite of forearm Active Problems:   Hypothyroidism   HLD (hyperlipidemia)   CKD (chronic kidney disease), stage III (HCC)  Infected cat bite of forearm-patient feeling a lot better able to move the hand and arms without much pain.  Patient can be discharged home 08/08/2017 on Augmentin 875 mg twice daily. -,failed outpatient therapy with oral Augmentin -Unasyn IV with pharmacy managing dosing -Follow-up on blood cultures obtained in the ER -EDP has consulted hand surgeon/Haddixwho saw no indication for surgery intervention but recommended IV antibiotics -Tylenol for mild pain -OxyIR for moderate pain -Morphine IV for severe pain -Routine wound care -  Active Problems: CKD (chronic kidney disease), stage III  -Last available GFR in 2015 was66 -Her GFR has decreased to 46 consistent with stage III kidney  disease  Hypothyroidism -Continue Synthroid  HLD (hyperlipidemia) -Diet controlled      DVT prophylaxis: SCD Code Status full code Family Communication: None Disposition Plan: Discharge home 5/19 on Augmentin 875 twice a day.  Consultants: Ortho  Procedures: None  Antimicrobials: Unasyn Subjective: Feels better and well.   Objective: Vitals:   08/06/17 0505 08/06/17 1533 08/06/17 2149 08/07/17 0531  BP: (!) 144/79 (!) 154/99 (!) 176/77 (!) 163/90  Pulse: 74 90 78 80  Resp: 16 18 16 18   Temp: 97.9 F (36.6 C) 98 F (36.7 C) 97.8 F (36.6 C) 97.9 F (36.6 C)  TempSrc: Oral Oral Oral Oral  SpO2: 97% 94% 97% 95%  Weight:      Height:        Intake/Output Summary (Last 24 hours) at 08/07/2017 1514 Last data filed at 08/07/2017 1500 Gross per 24 hour  Intake 1060 ml  Output -  Net 1060 ml   Filed Weights   08/05/17 2000  Weight: 68.2 kg (150 lb 7.1 oz)    Examination:  General exam: Appears calm and comfortable  Respiratory system: Clear to auscultation. Respiratory effort normal. Cardiovascular system: S1 & S2 heard, RRR. No JVD, murmurs, rubs, gallops or clicks. No pedal edema. Gastrointestinal system: Abdomen is nondistended, soft and nontender. No organomegaly or masses felt. Normal bowel sounds heard. Central nervous system: Alert and oriented. No focal neurological deficits. Extremities:moves the right arm and fingers without pain Skin: No rashes, lesions or ulcers Psychiatry: Judgement and insight appear normal. Mood & affect appropriate.     Data Reviewed: I have personally reviewed following labs and imaging studies  CBC: Recent Labs  Lab 08/05/17 0814 08/06/17 0422 08/07/17 0500  WBC  8.9 7.6 8.8  NEUTROABS 6.3  --  5.8  HGB 12.1 11.3* 11.3*  HCT 38.8 36.7 36.9  MCV 85.7 85.5 86.2  PLT 337 310 606   Basic Metabolic Panel: Recent Labs  Lab 08/05/17 0814 08/06/17 0422 08/07/17 0500  NA 136 138 140  K 3.8 3.7 3.8  CL 101 105  105  CO2 26 26 28   GLUCOSE 135* 104* 96  BUN 19 14 14   CREATININE 1.05* 0.92 0.88  CALCIUM 7.7* 7.0* 6.7*   GFR: Estimated Creatinine Clearance: 42.7 mL/min (by C-G formula based on SCr of 0.88 mg/dL). Liver Function Tests: Recent Labs  Lab 08/07/17 0500  AST 23  ALT 19  ALKPHOS 45  BILITOT 0.3  PROT 6.8  ALBUMIN 3.0*   No results for input(s): LIPASE, AMYLASE in the last 168 hours. No results for input(s): AMMONIA in the last 168 hours. Coagulation Profile: No results for input(s): INR, PROTIME in the last 168 hours. Cardiac Enzymes: No results for input(s): CKTOTAL, CKMB, CKMBINDEX, TROPONINI in the last 168 hours. BNP (last 3 results) No results for input(s): PROBNP in the last 8760 hours. HbA1C: No results for input(s): HGBA1C in the last 72 hours. CBG: No results for input(s): GLUCAP in the last 168 hours. Lipid Profile: No results for input(s): CHOL, HDL, LDLCALC, TRIG, CHOLHDL, LDLDIRECT in the last 72 hours. Thyroid Function Tests: No results for input(s): TSH, T4TOTAL, FREET4, T3FREE, THYROIDAB in the last 72 hours. Anemia Panel: No results for input(s): VITAMINB12, FOLATE, FERRITIN, TIBC, IRON, RETICCTPCT in the last 72 hours. Sepsis Labs: Recent Labs  Lab 08/05/17 1406 08/05/17 1710  LATICACIDVEN 0.6 1.1    Recent Results (from the past 240 hour(s))  Aerobic/Anaerobic Culture (surgical/deep wound)     Status: None (Preliminary result)   Collection Time: 08/05/17  8:11 AM  Result Value Ref Range Status   Specimen Description ARM  Final   Special Requests RIGHT  Final   Gram Stain   Final    RARE WBC PRESENT, PREDOMINANTLY PMN NO ORGANISMS SEEN    Culture   Final    NO GROWTH 2 DAYS NO ANAEROBES ISOLATED; CULTURE IN PROGRESS FOR 5 DAYS Performed at [facility], [address], [place], [place] [address]    Report Status PENDING  Incomplete  Blood culture (routine x 2)     Status: None (Preliminary result)   Collection Time: 08/05/17  8:14  AM  Result Value Ref Range Status   Specimen Description BLOOD RIGHT HAND  Final   Special Requests   Final    IN BOTH AEROBIC AND ANAEROBIC BOTTLES Blood Culture adequate volume   Culture   Final    NO GROWTH 2 DAYS Performed at [facility], [place] [address] [place], [place] [address]    Report Status PENDING  Incomplete  Blood culture (routine x 2)     Status: None (Preliminary result)   Collection Time: 08/05/17  8:16 AM  Result Value Ref Range Status   Specimen Description BLOOD RIGHT ANTECUBITAL  Final   Special Requests   Final    IN BOTH AEROBIC AND ANAEROBIC BOTTLES Blood Culture adequate volume   Culture   Final    NO GROWTH 2 DAYS Performed at [facility], [place] [address], [place], [place] [address]    Report Status PENDING  Incomplete         Radiology Studies: No results found.      Scheduled Meds: . calcitRIOL  0.25 mcg Oral Once per day on Mon Wed Fri  . calcium carbonate  1 tablet Oral Q breakfast  . docusate sodium  100 mg Oral Daily  . levothyroxine  100 mcg Oral QAC breakfast  . loratadine  10 mg Oral Daily  . multivitamin with minerals  1 tablet Oral Daily  . sodium chloride flush  3 mL Intravenous Q12H  . vitamin C  500 mg Oral Daily   Continuous Infusions: . ampicillin-sulbactam (UNASYN) IV Stopped (08/07/17 1058)     LOS: 2 days     Georgette Shell, MD [facility] If 7PM-7AM, please contact night-coverage [email or web address] Password [email or web address] 08/07/2017, 3:14 PM

## 2017-08-08 DIAGNOSIS — E785 Hyperlipidemia, unspecified: Secondary | ICD-10-CM

## 2017-08-08 DIAGNOSIS — S51851S Open bite of right forearm, sequela: Secondary | ICD-10-CM

## 2017-08-08 DIAGNOSIS — N183 Chronic kidney disease, stage 3 (moderate): Secondary | ICD-10-CM

## 2017-08-08 DIAGNOSIS — E039 Hypothyroidism, unspecified: Secondary | ICD-10-CM

## 2017-08-08 DIAGNOSIS — L089 Local infection of the skin and subcutaneous tissue, unspecified: Secondary | ICD-10-CM

## 2017-08-08 DIAGNOSIS — W5501XS Bitten by cat, sequela: Secondary | ICD-10-CM

## 2017-08-08 DIAGNOSIS — L03113 Cellulitis of right upper limb: Secondary | ICD-10-CM

## 2017-08-08 MED ORDER — AMOXICILLIN-POT CLAVULANATE 875-125 MG PO TABS
1.0000 | ORAL_TABLET | Freq: Two times a day (BID) | ORAL | Status: DC
  Administered 2017-08-08: 1 via ORAL
  Filled 2017-08-08: qty 1

## 2017-08-08 NOTE — Discharge Summary (Signed)
Physician Discharge Summary  [name] [name] [id number]:[id number] DOB: 08-Feb-1928  PCP: Marletta Lor, MD  Admit date: 08/05/2017 Discharge date: 08/08/2017  Recommendations for Outpatient Follow-up:  1. Dr. Bluford Kaufmann, PCP in 1 week with repeat labs (CBC & BMP).  Please follow final blood culture results that were sent from the hospital. 2. Dr. Katha Hamming, Orthopedics, as needed  Home Health: None Equipment/Devices: None  Discharge Condition: Improved and stable CODE STATUS: Full Diet recommendation: Heart healthy diet.  Discharge Diagnoses:  Principal Problem:   Infected cat bite of forearm Active Problems:   Hypothyroidism   HLD (hyperlipidemia)   CKD (chronic kidney disease), stage III (HCC)   Brief Summary: 84 year old female, lives alone, independent of her activities, PMH of lipidemia, osteoporosis, chronic kidney disease and hypothyroidism, while attempting to sit down on a chair, the chair slid and patient sustained fall at home without injuries but this scared her pet cat who recently had kittens 4 weeks ago and the cat bit her on her right forearm.  She was seen in the ED and placed on oral Augmentin and discharged home on 4/27.  Patient noted some redness, pain and mild drainage and return to the ED and admitted for further evaluation and management.  Assessment and plan:  1. Cellulitis of right forearm: Secondary to pet cat bite.  History as noted above.  Possibly not enough time for oral Augmentin to have taken effect.  Admitted and started on IV and completed 3 days course.  Orthopedics was consulted and did not see any surgical needs.  Blood cultures x3: Negative to date.  X-rays 4/27 without acute findings.  Cellulitis clinically resolved.  Right forearm wounds are healing well.  I discussed with infectious disease MD on call and appropriate to discharge her back on oral Augmentin.  She is advised to complete additional 4 days.  Outpatient follow-up with PCP in a  week's time and with orthopedics as needed if symptoms or signs worsen.  She verbalized understanding. 2. Stage III chronic kidney disease: Creatinine stable and normal. 3. Hypothyroid: Clinically euthyroid.  Continue Synthroid. 4. Dyslipidemia: Not on medications PTA. 5. Normocytic anemia: Stable.  Follow CBC in a week as outpatient. 6. Hypocalcemia: Corrected serum calcium is 7.5.  Asymptomatic.  Continue calcitriol and home oral calcium supplements and follow BMP next week.   Consultations:  Orthopedics  Procedures:  None   Discharge Instructions  Discharge Instructions    Call MD for:  difficulty breathing, headache or visual disturbances   Complete by:  As directed    Call MD for:  extreme fatigue   Complete by:  As directed    Call MD for:  persistant dizziness or light-headedness   Complete by:  As directed    Call MD for:  persistant nausea and vomiting   Complete by:  As directed    Call MD for:  redness, tenderness, or signs of infection (pain, swelling, redness, odor or green/yellow discharge around incision site)   Complete by:  As directed    Call MD for:  severe uncontrolled pain   Complete by:  As directed    Call MD for:  temperature >100.4   Complete by:  As directed    Diet - low sodium heart healthy   Complete by:  As directed    Discharge instructions   Complete by:  As directed    Take the Augmentin for 4 more days then stop.   Increase activity slowly   Complete by:  As directed        Medication List    TAKE these medications   amoxicillin-clavulanate 875-125 MG tablet Commonly known as:  AUGMENTIN Take 1 tablet by mouth 2 (two) times daily. One po bid x 7 days   calcitRIOL 0.25 MCG capsule Commonly known as:  ROCALTROL TAKE 1 CAPSULE BY MOUTH THREE TIMES A WEEK   calcium carbonate 600 MG Tabs tablet Commonly known as:  OS-CAL Take 600 mg by mouth daily.   cetirizine 10 MG tablet Commonly known as:  ZYRTEC Take 10 mg by mouth  daily.   docusate sodium 100 MG capsule Commonly known as:  COLACE Take 100 mg by mouth daily.   levothyroxine 100 MCG tablet Commonly known as:  SYNTHROID, LEVOTHROID Take 1 tablet (100 mcg total) by mouth daily before breakfast.   LUDENS COUGH DROPS MT Use as directed 1 drop in the mouth or throat as needed (dry throat).   multivitamins ther. w/minerals Tabs tablet Take 1 tablet by mouth daily.   vitamin C 500 MG tablet Commonly known as:  ASCORBIC ACID Take 1 tablet (500 mg total) by mouth daily.      Follow-up Information    Marletta Lor, MD. Schedule an appointment as soon as possible for a visit in 1 week(s).   Specialty:  Internal Medicine Why:  To be seen with repeat labs (CBC & BMP). Contact information: [place] [address] [phone]        Shona Needles, MD Follow up.   Specialty:  Orthopedic Surgery Why:  as needed if Right forearm infection gets worse. Contact information: [place] [address] [phone]          No Known Allergies    Procedures/Studies: Dg Forearm Right  Result Date: 08/04/2017 CLINICAL DATA:  Patient was bit by her cat this morning after she fell down. Patient has multiple scratches and bites to right forearm and is also complains of right thumb pain EXAM: RIGHT FOREARM - 2 VIEW COMPARISON:  None. FINDINGS: Radius and ulna appear intact. No acute fracture or dislocation. Mild ulna minus. Degenerative changes in the carpus. Soft tissues are unremarkable. No radiopaque soft tissue foreign bodies. IMPRESSION: No acute bony abnormalities. No radiopaque soft tissue foreign bodies. Electronically Signed   By: Lucienne Capers M.D.   On: 08/04/2017 06:23   Dg Finger Thumb Right  Result Date: 08/04/2017 CLINICAL DATA:  Patient was bit by her cat this morning after she fell down. Patient has multiple scratches and bites to right forearm and is also complains of right thumb  pain EXAM: RIGHT THUMB 2+V COMPARISON:  Right hand 10/01/2016 FINDINGS: Degenerative changes in the first metacarpal phalangeal and interphalangeal joints. No evidence of acute fracture or dislocation. No focal bone lesion or bone destruction. Soft tissue swelling over the metacarpal phalangeal joint. No radiopaque soft tissue foreign bodies or gas collections. IMPRESSION: Degenerative changes in the right first finger. Soft tissue swelling over the metacarpal phalangeal joint. No acute bony abnormalities. No radiopaque foreign bodies. Electronically Signed   By: Lucienne Capers M.D.   On: 08/04/2017 06:24      Subjective: Patient states she feels much better.  Denies pain, redness or drainage from healing wounds of right forearm.  No fever or chills.  As per RN, ambulated in the room without unsteadiness or difficulty.  No other active issues reported.  Discharge Exam:  Vitals:   08/07/17 2315 08/07/17 2317 08/08/17  0500 08/08/17 0510  BP: (!) 151/90 (!) 160/95  (!) 156/87  Pulse: 83 78  68  Resp: 16   16  Temp: (!) 97.5 F (36.4 C)  97.7 F (36.5 C)   TempSrc: Oral  Oral   SpO2: 96%   96%  Weight:      Height:        General: Pleasant elderly female, small built in thinly nourished, sitting up comfortably in chair this morning. Cardiovascular: S1 & S2 heard, RRR, S1/S2 +. No murmurs, rubs, gallops or clicks. No JVD or pedal edema. Respiratory: Clear to auscultation without wheezing, rhonchi or crackles. No increased work of breathing. Abdominal:  Non distended, non tender & soft. No organomegaly or masses appreciated. Normal bowel sounds heard. CNS: Alert and oriented. No focal deficits. Extremities: no edema, no cyanosis.  Right forearm without erythema, swelling, increased warmth or tenderness.  Couple of superficial wounds over mid forearm healing well with scabbing.  Good peripheral pulses felt.    The results of significant diagnostics from this hospitalization (including  imaging, microbiology, ancillary and laboratory) are listed below for reference.     Microbiology: Recent Results (from the past 240 hour(s))  Aerobic/Anaerobic Culture (surgical/deep wound)     Status: None (Preliminary result)   Collection Time: 08/05/17  8:11 AM  Result Value Ref Range Status   Specimen Description ARM  Final   Special Requests RIGHT  Final   Gram Stain   Final    RARE WBC PRESENT, PREDOMINANTLY PMN NO ORGANISMS SEEN    Culture   Final    NO GROWTH 3 DAYS NO ANAEROBES ISOLATED; CULTURE IN PROGRESS FOR 5 DAYS Performed at [facility], [address] [place], [place] [address]    Report Status PENDING  Incomplete  Blood culture (routine x 2)     Status: None (Preliminary result)   Collection Time: 08/05/17  8:14 AM  Result Value Ref Range Status   Specimen Description BLOOD RIGHT HAND  Final   Special Requests   Final    IN BOTH AEROBIC AND ANAEROBIC BOTTLES Blood Culture adequate volume   Culture   Final    NO GROWTH 3 DAYS Performed at [facility], [place] [address] [place], [place] [address]    Report Status PENDING  Incomplete  Blood culture (routine x 2)     Status: None (Preliminary result)   Collection Time: 08/05/17  8:16 AM  Result Value Ref Range Status   Specimen Description BLOOD RIGHT ANTECUBITAL  Final   Special Requests   Final    IN BOTH AEROBIC AND ANAEROBIC BOTTLES Blood Culture adequate volume   Culture   Final    NO GROWTH 3 DAYS Performed at [facility], [place] [address] [place], [place] [address]    Report Status PENDING  Incomplete     Labs: CBC: Recent Labs  Lab 08/05/17 0814 08/06/17 0422 08/07/17 0500  WBC 8.9 7.6 8.8  NEUTROABS 6.3  --  5.8  HGB 12.1 11.3* 11.3*  HCT 38.8 36.7 36.9  MCV 85.7 85.5 86.2  PLT 337 310 355   Basic Metabolic Panel: Recent Labs  Lab 08/05/17 0814 08/06/17 0422 08/07/17 0500  NA 136 138 140  K 3.8 3.7 3.8  CL 101 105 105  CO2 26 26 28   GLUCOSE 135* 104* 96  BUN  19 14 14   CREATININE 1.05* 0.92 0.88  CALCIUM 7.7* 7.0* 6.7*   Liver Function Tests: Recent Labs  Lab 08/07/17 0500  AST 23  ALT 19  ALKPHOS 45  BILITOT 0.3  PROT 6.8  ALBUMIN 3.0*    I was unable to reach her 3 children via phone to provide care update.   Time coordinating discharge: 25 minutes  SIGNED:  Vernell Leep, MD, FACP, [facility]. [facility] Pager [phone]  If 7PM-7AM, please contact night-coverage [email or web address] Password [email or web address] 08/08/2017, 1:43 PM

## 2017-08-08 NOTE — Progress Notes (Signed)
Patient was discharged home by MD order; discharged instructions  review and give to patient with care notes; IV DIC; dressing on RFA changed; patient will be escorted to the car by nurse tech via wheelchair.

## 2017-08-08 NOTE — Discharge Instructions (Signed)
Please get your medications reviewed and adjusted by your Primary MD. ° °Please request your Primary MD to go over all Hospital Tests and Procedure/Radiological results at the follow up, please get all Hospital records sent to your Prim MD by signing hospital release before you go home. ° °If you had Pneumonia of Lung problems at the Hospital: °Please get a 2 view Chest X ray done in 6-8 weeks after hospital discharge or sooner if instructed by your Primary MD. ° °If you have Congestive Heart Failure: °Please call your Cardiologist or Primary MD anytime you have any of the following symptoms:  °1) 3 pound weight gain in 24 hours or 5 pounds in 1 week  °2) shortness of breath, with or without a dry hacking cough  °3) swelling in the hands, feet or stomach  °4) if you have to sleep on extra pillows at night in order to breathe ° °Follow cardiac low salt diet and 1.5 lit/day fluid restriction. ° °If you have diabetes °Accuchecks 4 times/day, Once in AM empty stomach and then before each meal. °Log in all results and show them to your primary doctor at your next visit. °If any glucose reading is under 80 or above 300 call your primary MD immediately. ° °If you have Seizure/Convulsions/Epilepsy: °Please do not drive, operate heavy machinery, participate in activities at heights or participate in high speed sports until you have seen by Primary MD or a Neurologist and advised to do so again. ° °If you had Gastrointestinal Bleeding: °Please ask your Primary MD to check a complete blood count within one week of discharge or at your next visit. Your endoscopic/colonoscopic biopsies that are pending at the time of discharge, will also need to followed by your Primary MD. ° °Get Medicines reviewed and adjusted. °Please take all your medications with you for your next visit with your Primary MD ° °Please request your Primary MD to go over all hospital tests and procedure/radiological results at the follow up, please ask your  Primary MD to get all Hospital records sent to his/her office. ° °If you experience worsening of your admission symptoms, develop shortness of breath, life threatening emergency, suicidal or homicidal thoughts you must seek medical attention immediately by calling 911 or calling your MD immediately  if symptoms less severe. ° °You must read complete instructions/literature along with all the possible adverse reactions/side effects for all the Medicines you take and that have been prescribed to you. Take any new Medicines after you have completely understood and accpet all the possible adverse reactions/side effects.  ° °Do not drive or operate heavy machinery when taking Pain medications.  ° °Do not take more than prescribed Pain, Sleep and Anxiety Medications ° °Special Instructions: If you have smoked or chewed Tobacco  in the last 2 yrs please stop smoking, stop any regular Alcohol  and or any Recreational drug use. ° °Wear Seat belts while driving. ° °Please note °You were cared for by a hospitalist during your hospital stay. If you have any questions about your discharge medications or the care you received while you were in the hospital after you are discharged, you can call the unit and asked to speak with the hospitalist on call if the hospitalist that took care of you is not available. Once you are discharged, your primary care physician will handle any further medical issues. Please note that NO REFILLS for any discharge medications will be authorized once you are discharged, as it is imperative that you   return to your primary care physician (or establish a relationship with a primary care physician if you do not have one) for your aftercare needs so that they can reassess your need for medications and monitor your lab values. ° °You can reach the hospitalist office at phone [phone] or fax [phone] °  °If you do not have a primary care physician, you can call [phone] for a physician  referral. ° ° °Cellulitis, Adult °Cellulitis is a skin infection. The infected area is usually red and tender. This condition occurs most often in the arms and lower legs. The infection can travel to the muscles, blood, and underlying tissue and become serious. It is very important to get treated for this condition. °What are the causes? °Cellulitis is caused by bacteria. The bacteria enter through a break in the skin, such as a cut, burn, insect bite, open sore, or crack. °What increases the risk? °This condition is more likely to occur in people who: °· Have a weak defense system (immune system). °· Have open wounds on the skin such as cuts, burns, bites, and scrapes. Bacteria can enter the body through these open wounds. °· Are older. °· Have diabetes. °· Have a type of long-lasting (chronic) liver disease (cirrhosis) or kidney disease. °· Use IV drugs. ° °What are the signs or symptoms? °Symptoms of this condition include: °· Redness, streaking, or spotting on the skin. °· Swollen area of the skin. °· Tenderness or pain when an area of the skin is touched. °· Warm skin. °· Fever. °· Chills. °· Blisters. ° °How is this diagnosed? °This condition is diagnosed based on a medical history and physical exam. You may also have tests, including: °· Blood tests. °· Lab tests. °· Imaging tests. ° °How is this treated? °Treatment for this condition may include: °· Medicines, such as antibiotic medicines or antihistamines. °· Supportive care, such as rest and application of cold or warm cloths (cold or warm compresses) to the skin. °· Hospital care, if the condition is severe. ° °The infection usually gets better within 1-2 days of treatment. °Follow these instructions at home: °· Take over-the-counter and prescription medicines only as told by your health care provider. °· If you were prescribed an antibiotic medicine, take it as told by your health care provider. Do not stop taking the antibiotic even if you start to feel  better. °· Drink enough fluid to keep your urine clear or pale yellow. °· Do not touch or rub the infected area. °· Raise (elevate) the infected area above the level of your heart while you are sitting or lying down. °· Apply warm or cold compresses to the affected area as told by your health care provider. °· Keep all follow-up visits as told by your health care provider. This is important. These visits let your health care provider make sure a more serious infection is not developing. °Contact a health care provider if: °· You have a fever. °· Your symptoms do not improve within 1-2 days of starting treatment. °· Your bone or joint underneath the infected area becomes painful after the skin has healed. °· Your infection returns in the same area or another area. °· You notice a swollen bump in the infected area. °· You develop new symptoms. °· You have a general ill feeling (malaise) with muscle aches and pains. °Get help right away if: °· Your symptoms get worse. °· You feel very sleepy. °· You develop vomiting or diarrhea that persists. °· You notice   red streaks coming from the infected area. °· Your red area gets larger or turns dark in color. °This information is not intended to replace advice given to you by your health care provider. Make sure you discuss any questions you have with your health care provider. °Document Released: 01/04/2005 Document Revised: 08/05/2015 Document Reviewed: 02/03/2015 °Elsevier Interactive Patient Education © 2018 [employer] ° °

## 2017-08-08 NOTE — Consult Note (Signed)
            [facility] CM Primary Care Navigator  08/08/2017  [name] [name] [birth year] [id number]   Seen patient at the bedsideto identify possible discharge needs. Patientreports that she almost fell on her cat who had just had kittens and therefore attacked her. She was then seen and treated in the emergency room on 4/27 but returned back for a wound check that appeared infected which resulted to this admission.  PatientendorsesDr.Peter Burnice Logan with [facility] [place] asherprimary care provider.   [facility] [place] [facility].   Patientstatesthatshehas been managingherownmedications at [facility] use of "pill box" filled on a daily basis.  Patient reports that she does not drive but her daughter [name]- lives nearby) has been providing transportation to her doctors'appointments.  Patientlivesalone and independent with self care. Her daughter serves asherprimary caregiver at home as stated.   Anticipated discharge plan ishomeperpatient.  Patientvoiced understanding to call primary care provider's office whenshereturns home, for a post discharge follow-up appointment within1- 2 weeksor sooner if needs arise.Patient letter (with PCP's contact number) was provided asareminder.  Explained topatient regarding[facility] CM services available for health managementat homebut shedeniesanycurrent needs or concernsat thistime.  Patientexpressedunderstandingto seek referral from primary care provider to [facility] care management ifdeemednecessaryandappropriate for anyservices in the future.  [facility] care management information provided for future needs thatshemay have.  Patienthad opted andverbally agreed forEMMIcalls tofollowup her recoveryat home.  Referral made for [facility] calls after discharge.   For additional questions please contact:  Edwena Felty A.  Dexter Signor, BSN, RN-BC [facility] PRIMARY CARE Navigator Cell: [phone]

## 2017-08-09 ENCOUNTER — Telehealth: Payer: Self-pay | Admitting: *Deleted

## 2017-08-09 NOTE — Telephone Encounter (Signed)
Unable to reach patient at time of TCM Call. Left message for patient to return call when available.  

## 2017-08-10 LAB — CULTURE, BLOOD (ROUTINE X 2)
Culture: NO GROWTH
Culture: NO GROWTH

## 2017-08-10 LAB — AEROBIC/ANAEROBIC CULTURE W GRAM STAIN (SURGICAL/DEEP WOUND): Culture: NORMAL

## 2017-08-10 LAB — AEROBIC/ANAEROBIC CULTURE (SURGICAL/DEEP WOUND)

## 2017-08-10 NOTE — Telephone Encounter (Signed)
Unable to reach patient at time of TCM Call. Left message for patient to return call when available.  

## 2017-08-23 ENCOUNTER — Ambulatory Visit (INDEPENDENT_AMBULATORY_CARE_PROVIDER_SITE_OTHER): Payer: PPO | Admitting: Adult Health

## 2017-08-23 ENCOUNTER — Encounter: Payer: Self-pay | Admitting: Adult Health

## 2017-08-23 VITALS — BP 170/80 | Temp 97.8°F | Wt 144.0 lb

## 2017-08-23 DIAGNOSIS — L03113 Cellulitis of right upper limb: Secondary | ICD-10-CM

## 2017-08-23 MED ORDER — DOXYCYCLINE HYCLATE 100 MG PO CAPS: 100.0000 mg | ORAL_CAPSULE | Freq: Two times a day (BID) | ORAL | 0 refills | Status: DC

## 2017-08-23 NOTE — Progress Notes (Signed)
Subjective:    Patient ID: [name] [name], female    DOB: 01/13/1928, 84 y.o.   MRN: [id number]  HPI 84 year old female who  has a past medical history of ALLERGIC RHINITIS (11/02/2006), CKD (chronic kidney disease), stage III ([place]), HYPERLIPIDEMIA (11/04/2006), HYPOTHYROIDISM, POSTSURGICAL (03/28/2007), Malignant neoplasm of thyroid gland ([place]) (03/28/2007), and OSTEOPENIA (11/02/2006). She is a patient of Dr. Raliegh Ip who I am seeing today for an acute issue after hospital admission.  She was discharged from hospital on 08/08/2017 for cellulitis of right forearm arm secondary to pet cat bite.  When she was admitted she was started on IV antibiotics and complete a 3-day course, cultures were negative x3 she was discharged on a 4-day course of Augmentin, which she has since finished up.  Upon discharge cellulitis had clinically resolved.  Since being discharged cellulitis has returned to right upper arm.  Reports redness, warmth, and pain.    Review of Systems See HPI   Past Medical History:  Diagnosis Date  . ALLERGIC RHINITIS 11/02/2006  . CKD (chronic kidney disease), stage III ([place])   . HYPERLIPIDEMIA 11/04/2006  . HYPOTHYROIDISM, POSTSURGICAL 03/28/2007  . Malignant neoplasm of thyroid gland ([place]) 03/28/2007   5/07: righte lobectomy--path shows 3 cm follicular ca 4/09: completion left lobectomy, no more tumor seen 09/07: thyroglobulin 0.2 9ab neg) 11/07: i-131 rx, [facility] 6/08: thyroglogulin 0.2 (ab neg) , hypothyroid body scan neg (clinically tolerated hypothyroidism poorly) 06/09: tg undetectable (ab=1.5, normal) 12/10: tg undetectable (ab neg) 6/11: tg undectable (ab neg)  . OSTEOPENIA 11/02/2006    Social History   Socioeconomic History  . Marital status: Married    Spouse name: Not on file  . Number of children: Not on file  . Years of education: Not on file  . Highest education level: Not on file  Occupational History  . Not on file  Social Needs  . Financial resource strain: Not on  file  . Food insecurity:    Worry: Not on file    Inability: Not on file  . Transportation needs:    Medical: Not on file    Non-medical: Not on file  Tobacco Use  . Smoking status: Never Smoker  . Smokeless tobacco: Never Used  Substance and Sexual Activity  . Alcohol use: Not on file  . Drug use: Not on file  . Sexual activity: Not on file  Lifestyle  . Physical activity:    Days per week: Not on file    Minutes per session: Not on file  . Stress: Not on file  Relationships  . Social connections:    Talks on phone: Not on file    Gets together: Not on file    Attends religious service: Not on file    Active member of club or organization: Not on file    Attends meetings of clubs or organizations: Not on file    Relationship status: Not on file  . Intimate partner violence:    Fear of current or ex partner: Not on file    Emotionally abused: Not on file    Physically abused: Not on file    Forced sexual activity: Not on file  Other Topics Concern  . Not on file  Social History Narrative   Doesn't drive    Past Surgical History:  Procedure Laterality Date  . ABDOMINAL HYSTERECTOMY    . CERVICAL POLYPECTOMY    . OPEN REDUCTION INTERNAL FIXATION (ORIF) DISTAL RADIAL FRACTURE Left 01/14/2014  Procedure: OPEN REDUCTION INTERNAL FIXATION DISTAL RADIAL FRACTURE;  Surgeon: Linna Hoff, MD;  Location: [place];  Service: Orthopedics;  Laterality: Left;    Family History  Problem Relation Age of Onset  . Diabetes Sister     No Known Allergies  Current Outpatient Medications on File Prior to Visit  Medication Sig Dispense Refill  . calcitRIOL (ROCALTROL) 0.25 MCG capsule TAKE 1 CAPSULE BY MOUTH THREE TIMES A WEEK 13 capsule 2  . calcium carbonate (OS-CAL) 600 MG TABS Take 600 mg by mouth daily.     . cetirizine (ZYRTEC) 10 MG tablet Take 10 mg by mouth daily.    Marland Kitchen docusate sodium (COLACE) 100 MG capsule Take 100 mg by mouth daily.    Marland Kitchen levothyroxine (SYNTHROID,  LEVOTHROID) 100 MCG tablet Take 1 tablet (100 mcg total) by mouth daily before breakfast. 90 tablet 3  . Menthol-Ascorbic Acid (LUDENS COUGH DROPS MT) Use as directed 1 drop in the mouth or throat as needed (dry throat).    . Multiple Vitamins-Minerals (MULTIVITAMINS THER. W/MINERALS) TABS Take 1 tablet by mouth daily.      . vitamin C (ASCORBIC ACID) 500 MG tablet Take 1 tablet (500 mg total) by mouth daily. 50 tablet 0   No current facility-administered medications on file prior to visit.     BP (!) 170/80   Temp 97.8 F (36.6 C) (Oral)   Wt 144 lb (65.3 kg)   BMI 24.72 kg/m       Objective:   Physical Exam  Constitutional: She is oriented to person, place, and time. She appears well-developed and well-nourished. No distress.  Cardiovascular: Normal rate, regular rhythm, normal heart sounds and intact distal pulses. Exam reveals no gallop and no friction rub.  No murmur heard. Pulmonary/Chest: Effort normal and breath sounds normal. No stridor. No respiratory distress. She has no wheezes. She has no rales. She exhibits no tenderness.  Neurological: She is alert and oriented to person, place, and time.  Skin: Capillary refill takes less than 2 seconds. Rash noted. She is not diaphoretic. There is erythema.  Redness and warmth noted to right upper arm and right forearm.  No active streaking noted  Psychiatric: She has a normal mood and affect. Her behavior is normal. Judgment and thought content normal.  Nursing note and vitals reviewed.     Assessment & Plan:  1. Cellulitis of right arm -Scribe 10-day course of doxycycline.  She already has a follow-up appointment with her primary care in 4 days.  Advised if cellulitis does not start to resolve over the weekend that she needs to go the emergency room for possible IV abxs  - doxycycline (VIBRAMYCIN) 100 MG capsule; Take 1 capsule (100 mg total) by mouth 2 (two) times daily.  Dispense: 20 capsule; Refill: 0   Dorothyann Peng, NP

## 2017-08-27 ENCOUNTER — Ambulatory Visit (INDEPENDENT_AMBULATORY_CARE_PROVIDER_SITE_OTHER): Payer: PPO | Admitting: Internal Medicine

## 2017-08-27 ENCOUNTER — Encounter: Payer: Self-pay | Admitting: Internal Medicine

## 2017-08-27 VITALS — BP 120/60 | HR 75 | Temp 98.2°F | Wt 143.0 lb

## 2017-08-27 DIAGNOSIS — L03113 Cellulitis of right upper limb: Secondary | ICD-10-CM | POA: Diagnosis not present

## 2017-08-27 DIAGNOSIS — W5501XD Bitten by cat, subsequent encounter: Secondary | ICD-10-CM

## 2017-08-27 MED ORDER — AMOXICILLIN-POT CLAVULANATE 875-125 MG PO TABS: 1.0000 | ORAL_TABLET | Freq: Two times a day (BID) | ORAL | 0 refills | Status: DC

## 2017-08-27 NOTE — Progress Notes (Signed)
Subjective:    Patient ID: [name] [name], female    DOB: [birth year], 84 y.o.   MRN: [id number]  HPI  84 year old patient who is seen today for follow-up after a cat bite.  The patient had a brief hospital admission and was treated with parenteral antibiotics for 2 days and discharged on 7 days of Augmentin.  She had a very nice clinical response and had total resolution of her right arm cellulitis but a few days after completion of antibiotic therapy and onset of increasing pain redness and soft tissue swelling.  She was seen 4 days ago and placed on doxycycline.  There has been modest clinical improvement.  No fever chills or other constitutional complaints.  Past Medical History:  Diagnosis Date  . ALLERGIC RHINITIS 11/02/2006  . CKD (chronic kidney disease), stage III ([place])   . HYPERLIPIDEMIA 11/04/2006  . HYPOTHYROIDISM, POSTSURGICAL 03/28/2007  . Malignant neoplasm of thyroid gland ([place]) 03/28/2007   5/07: righte lobectomy--path shows 3 cm follicular ca 7/86: completion left lobectomy, no more tumor seen 09/07: thyroglobulin 0.2 9ab neg) 11/07: i-131 rx, [facility] 6/08: thyroglogulin 0.2 (ab neg) , hypothyroid body scan neg (clinically tolerated hypothyroidism poorly) 06/09: tg undetectable (ab=1.5, normal) 12/10: tg undetectable (ab neg) 6/11: tg undectable (ab neg)  . OSTEOPENIA 11/02/2006     Social History   Socioeconomic History  . Marital status: Married    Spouse name: Not on file  . Number of children: Not on file  . Years of education: Not on file  . Highest education level: Not on file  Occupational History  . Not on file  Social Needs  . Financial resource strain: Not on file  . Food insecurity:    Worry: Not on file    Inability: Not on file  . Transportation needs:    Medical: Not on file    Non-medical: Not on file  Tobacco Use  . Smoking status: Never Smoker  . Smokeless tobacco: Never Used  Substance and Sexual Activity  . Alcohol use: Not on file  . Drug  use: Not on file  . Sexual activity: Not on file  Lifestyle  . Physical activity:    Days per week: Not on file    Minutes per session: Not on file  . Stress: Not on file  Relationships  . Social connections:    Talks on phone: Not on file    Gets together: Not on file    Attends religious service: Not on file    Active member of club or organization: Not on file    Attends meetings of clubs or organizations: Not on file    Relationship status: Not on file  . Intimate partner violence:    Fear of current or ex partner: Not on file    Emotionally abused: Not on file    Physically abused: Not on file    Forced sexual activity: Not on file  Other Topics Concern  . Not on file  Social History Narrative   Doesn't drive    Past Surgical History:  Procedure Laterality Date  . ABDOMINAL HYSTERECTOMY    . CERVICAL POLYPECTOMY    . OPEN REDUCTION INTERNAL FIXATION (ORIF) DISTAL RADIAL FRACTURE Left 01/14/2014   Procedure: OPEN REDUCTION INTERNAL FIXATION DISTAL RADIAL FRACTURE;  Surgeon: Linna Hoff, MD;  Location: [place];  Service: Orthopedics;  Laterality: Left;    Family History  Problem Relation Age of Onset  . Diabetes Sister  No Known Allergies  Current Outpatient Medications on File Prior to Visit  Medication Sig Dispense Refill  . calcitRIOL (ROCALTROL) 0.25 MCG capsule TAKE 1 CAPSULE BY MOUTH THREE TIMES A WEEK 13 capsule 2  . calcium carbonate (OS-CAL) 600 MG TABS Take 600 mg by mouth daily.     . cetirizine (ZYRTEC) 10 MG tablet Take 10 mg by mouth daily.    Marland Kitchen docusate sodium (COLACE) 100 MG capsule Take 100 mg by mouth daily.    Marland Kitchen levothyroxine (SYNTHROID, LEVOTHROID) 100 MCG tablet Take 1 tablet (100 mcg total) by mouth daily before breakfast. 90 tablet 3  . Menthol-Ascorbic Acid (LUDENS COUGH DROPS MT) Use as directed 1 drop in the mouth or throat as needed (dry throat).    . Multiple Vitamins-Minerals (MULTIVITAMINS THER. W/MINERALS) TABS Take 1 tablet by  mouth daily.      . vitamin C (ASCORBIC ACID) 500 MG tablet Take 1 tablet (500 mg total) by mouth daily. 50 tablet 0   No current facility-administered medications on file prior to visit.     BP 120/60 (BP Location: Right Arm, Patient Position: Sitting, Cuff Size: Normal)   Pulse 75   Temp 98.2 F (36.8 C) (Oral)   Wt 143 lb (64.9 kg)   SpO2 95%   BMI 24.55 kg/m     Review of Systems  Constitutional: Negative.   HENT: Negative for congestion, dental problem, hearing loss, rhinorrhea, sinus pressure, sore throat and tinnitus.   Eyes: Negative for pain, discharge and visual disturbance.  Respiratory: Negative for cough and shortness of breath.   Cardiovascular: Negative for chest pain, palpitations and leg swelling.  Gastrointestinal: Negative for abdominal distention, abdominal pain, blood in stool, constipation, diarrhea, nausea and vomiting.  Genitourinary: Negative for difficulty urinating, dysuria, flank pain, frequency, hematuria, pelvic pain, urgency, vaginal bleeding, vaginal discharge and vaginal pain.  Musculoskeletal: Negative for arthralgias, gait problem and joint swelling.  Skin: Positive for color change, rash and wound.  Neurological: Negative for dizziness, syncope, speech difficulty, weakness, numbness and headaches.  Hematological: Negative for adenopathy.  Psychiatric/Behavioral: Negative for agitation, behavioral problems and dysphoric mood. The patient is not nervous/anxious.        Objective:   Physical Exam  Constitutional: She appears well-developed and well-nourished. No distress.  Skin:  Persistent cellulitis involving the right arm.  There is soft tissue swelling and erythema involving the lateral elbow region and also the distal lower forearm area. The patient sustained a bite wound to the lower forearm area and some scratches to the elbow region          Assessment & Plan:   Cat bite with cellulitis.  The patient had a prompt and complete  clinical response to Augmentin but relapsed after completion of antibiotic therapy.  Patient has had a minimal response to doxycycline only.  Presently not taking a good anaerobic antibiotic.  Options including adding metronidazole or Cleocin but inclined to retreat with 10 days of Augmentin  Nyoka Cowden

## 2017-08-27 NOTE — Patient Instructions (Signed)
Animal Bite Animal bite wounds can get infected. It is important to get proper medical treatment. Ask your doctor if you need rabies treatment. Follow these instructions at home: Wound care  Follow instructions from your doctor about how to take care of your wound. Make sure you: ? Wash your hands with soap and water before you change your bandage (dressing). If you cannot use soap and water, use hand sanitizer. ? Change your bandage as told by your doctor. ? Leave stitches (sutures), skin glue, or skin tape (adhesive) strips in place. They may need to stay in place for 2 weeks or longer. If tape strips get loose and curl up, you may trim the loose edges. Do not remove tape strips completely unless your doctor says it is okay.  Check your wound every day for signs of infection. Watch for: ? Redness, swelling, or pain that gets worse. ? Fluid, blood, or pus. General instructions  Take or apply over-the-counter and prescription medicines only as told by your doctor.  If you were prescribed an antibiotic, take or apply it as told by your doctor. Do not stop using the antibiotic even if your condition improves.  Keep the injured area raised (elevated) above the level of your heart while you are sitting or lying down.  If directed, apply ice to the injured area. ? Put ice in a plastic bag. ? Place a towel between your skin and the bag. ? Leave the ice on for 20 minutes, 2-3 times per day.  Keep all follow-up visits as told by your doctor. This is important. Contact a doctor if:  You have redness, swelling, or pain that gets worse.  You have a general feeling of sickness (malaise).  You feel sick to your stomach (nauseous).  You throw up (vomit).  You have pain that does not get better. Get help right away if:  You have a red streak going away from your wound.  You have fluid, blood, or pus coming from your wound.  You have a fever or chills.  You have trouble moving your  injured area.  You have numbness or tingling anywhere on your body. This information is not intended to replace advice given to you by your health care provider. Make sure you discuss any questions you have with your health care provider. Document Released: 03/27/2005 Document Revised: 09/02/2015 Document Reviewed: 08/12/2014 Elsevier Interactive Patient Education  2018 Elsevier Inc[employer]  

## 2017-10-23 ENCOUNTER — Other Ambulatory Visit: Payer: Self-pay | Admitting: Endocrinology

## 2017-11-07 DIAGNOSIS — H6123 Impacted cerumen, bilateral: Secondary | ICD-10-CM | POA: Diagnosis not present

## 2017-11-07 DIAGNOSIS — H906 Mixed conductive and sensorineural hearing loss, bilateral: Secondary | ICD-10-CM | POA: Diagnosis not present

## 2017-11-12 ENCOUNTER — Other Ambulatory Visit: Payer: Self-pay | Admitting: Endocrinology

## 2017-11-21 ENCOUNTER — Ambulatory Visit (INDEPENDENT_AMBULATORY_CARE_PROVIDER_SITE_OTHER): Payer: PPO | Admitting: Endocrinology

## 2017-11-21 ENCOUNTER — Encounter: Payer: Self-pay | Admitting: Endocrinology

## 2017-11-21 VITALS — BP 134/78 | HR 74 | Ht 64.0 in | Wt 142.2 lb

## 2017-11-21 DIAGNOSIS — R200 Anesthesia of skin: Secondary | ICD-10-CM | POA: Diagnosis not present

## 2017-11-21 DIAGNOSIS — E890 Postprocedural hypothyroidism: Secondary | ICD-10-CM

## 2017-11-21 DIAGNOSIS — E208 Other hypoparathyroidism: Secondary | ICD-10-CM

## 2017-11-21 LAB — TSH: TSH: 0.13 u[IU]/mL — ABNORMAL LOW (ref 0.35–4.50)

## 2017-11-21 LAB — VITAMIN D 25 HYDROXY (VIT D DEFICIENCY, FRACTURES): VITD: 43.69 ng/mL (ref 30.00–100.00)

## 2017-11-21 LAB — VITAMIN B12: Vitamin B-12: 574 pg/mL (ref 211–911)

## 2017-11-21 MED ORDER — LEVOTHYROXINE SODIUM 75 MCG PO TABS: 75.0000 ug | ORAL_TABLET | Freq: Every day | ORAL | 3 refills | Status: DC

## 2017-11-21 NOTE — Progress Notes (Signed)
Subjective:    Patient ID: [name] [name], female    DOB: 18-Mar-1928, 84 y.o.   MRN: [id number]  HPI he state of at least three ongoing medical problems is addressed today, with interval history of each noted here: Stage-2 follicular adenocarcinoma of the thyroid.    5/07:  right lobectomy--path shows 3 cm right lobe follicular ca (T2 N0 M0). 8/07: completion left lobectomy, no more tumor seen.   9/07  thyroglobulin 11.8 (ab neg).   11/07: RAI, 101 mCi 6/08 thyroglogulin 0.2 (ab neg) 6/08: hypothyroid body scan neg (she tolerated thyroid hormone withdrawal poorly) 6/09 tg undetectable (ab=1.5) 12/10: tg undetectable (ab neg) 6/11:  tg undetectable (ab neg) 6/12: tg undetectable (ab neg) 6/13: tg undetectable (ab neg) 6/15: tg undetectable (ab neg) 6/16: tg undetectable (ab neg).   8/17: tg=0.1 (ab neg).  She denies any nodule at the neck.  This is a stable problem. Postsurgical hypothyroidism:  She denies cold intolerance.  This is a stable problem.   Postsurgical hypoparathyroidism:  Denies leg cramps.  This is a stable problem.  Pt states slight cramps in the legs, and assoc numbness.   Past Medical History:  Diagnosis Date  . ALLERGIC RHINITIS 11/02/2006  . CKD (chronic kidney disease), stage III ([place])   . HYPERLIPIDEMIA 11/04/2006  . HYPOTHYROIDISM, POSTSURGICAL 03/28/2007  . Malignant neoplasm of thyroid gland ([place]) 03/28/2007   5/07: righte lobectomy--path shows 3 cm follicular ca 0/03: completion left lobectomy, no more tumor seen 09/07: thyroglobulin 0.2 9ab neg) 11/07: i-131 rx, 101 mci 6/08: thyroglogulin 0.2 (ab neg) , hypothyroid body scan neg (clinically tolerated hypothyroidism poorly) 06/09: tg undetectable (ab=1.5, normal) 12/10: tg undetectable (ab neg) 6/11: tg undectable (ab neg)  . OSTEOPENIA 11/02/2006    Past Surgical History:  Procedure Laterality Date  . ABDOMINAL HYSTERECTOMY    . CERVICAL POLYPECTOMY    . OPEN REDUCTION INTERNAL FIXATION (ORIF) DISTAL  RADIAL FRACTURE Left 01/14/2014   Procedure: OPEN REDUCTION INTERNAL FIXATION DISTAL RADIAL FRACTURE;  Surgeon: Linna Hoff, MD;  Location: [facility];  Service: Orthopedics;  Laterality: Left;    Social History   Socioeconomic History  . Marital status: Married    Spouse name: Not on file  . Number of children: Not on file  . Years of education: Not on file  . Highest education level: Not on file  Occupational History  . Not on file  Social Needs  . Financial resource strain: Not on file  . Food insecurity:    Worry: Not on file    Inability: Not on file  . Transportation needs:    Medical: Not on file    Non-medical: Not on file  Tobacco Use  . Smoking status: Never Smoker  . Smokeless tobacco: Never Used  Substance and Sexual Activity  . Alcohol use: Not on file  . Drug use: Not on file  . Sexual activity: Not on file  Lifestyle  . Physical activity:    Days per week: Not on file    Minutes per session: Not on file  . Stress: Not on file  Relationships  . Social connections:    Talks on phone: Not on file    Gets together: Not on file    Attends religious service: Not on file    Active member of club or organization: Not on file    Attends meetings of clubs or organizations: Not on file    Relationship status: Not on file  . Intimate partner  violence:    Fear of current or ex partner: Not on file    Emotionally abused: Not on file    Physically abused: Not on file    Forced sexual activity: Not on file  Other Topics Concern  . Not on file  Social History Narrative   Doesn't drive    Current Outpatient Medications on File Prior to Visit  Medication Sig Dispense Refill  . calcium carbonate (OS-CAL) 600 MG TABS Take 600 mg by mouth daily.     . cetirizine (ZYRTEC) 10 MG tablet Take 10 mg by mouth daily.    Marland Kitchen docusate sodium (COLACE) 100 MG capsule Take 100 mg by mouth daily.    . Menthol-Ascorbic Acid (LUDENS COUGH DROPS MT) Use as directed 1 drop in the mouth  or throat as needed (dry throat).    . Multiple Vitamins-Minerals (MULTIVITAMINS THER. W/MINERALS) TABS Take 1 tablet by mouth daily.      . vitamin C (ASCORBIC ACID) 500 MG tablet Take 1 tablet (500 mg total) by mouth daily. 50 tablet 0   No current facility-administered medications on file prior to visit.     No Known Allergies  Family History  Problem Relation Age of Onset  . Diabetes Sister     BP 134/78 (BP Location: Left Arm, Patient Position: Sitting, Cuff Size: Normal)   Pulse 74   Ht 5\' 4"  (1.626 m)   Wt 142 lb 3.2 oz (64.5 kg)   SpO2 97%   BMI 24.41 kg/m   Review of Systems Denies weight change and falls.     Objective:   Physical Exam VITAL SIGNS:  See vs page GENERAL: no distress Pulses: dorsalis pedis intact bilat.   MSK: gait normal and steady CV: no leg edema Skin:  no ulcer on the legs.  normal color and temp on the legs Neuro: sensation is intact to touch on the legs  outside test results are reviewed: A1c=6.4%     Assessment & Plan:  Differentiated thyroid cancer. We discussed.  She doesn't need f/u Numbness: new Hyperglycemia: no medication needed: PCP can follow.   Hypothyroidism: recheck today Hypoparathyroidism: recheck today  Patient Instructions  blood tests are being requested for you today.  We'll contact you with results. Please come back for a follow-up appointment in 6 months.

## 2017-11-21 NOTE — Patient Instructions (Addendum)
blood tests are being requested for you today.  We'll contact you with results. Please come back for a follow-up appointment in 6 months  

## 2017-11-22 LAB — PTH, INTACT AND CALCIUM
Calcium: 7.9 mg/dL — ABNORMAL LOW (ref 8.6–10.4)
PTH: 19 pg/mL (ref 14–64)

## 2017-11-22 MED ORDER — CALCITRIOL 0.25 MCG PO CAPS: 0.2500 ug | ORAL_CAPSULE | ORAL | 11 refills | Status: DC

## 2017-11-28 ENCOUNTER — Encounter: Payer: Self-pay | Admitting: Family Medicine

## 2017-11-28 ENCOUNTER — Ambulatory Visit (INDEPENDENT_AMBULATORY_CARE_PROVIDER_SITE_OTHER): Payer: PPO | Admitting: Family Medicine

## 2017-11-28 VITALS — BP 132/80 | HR 88 | Temp 97.8°F | Ht 64.0 in | Wt 140.0 lb

## 2017-11-28 DIAGNOSIS — R42 Dizziness and giddiness: Secondary | ICD-10-CM | POA: Diagnosis not present

## 2017-11-28 NOTE — Patient Instructions (Signed)
It was very nice to see you today!  Keep up the good work!  Make sure you increase your fluid intake.  No other changes today.  Come back soon for your wellness visit and flu shot.  Take care, Dr Jerline Pain

## 2017-11-28 NOTE — Progress Notes (Signed)
   Subjective:  [name] [name] is a 84 y.o. female who presents today with a chief complaint of lightheadedness and to transfer care.  HPI:  Lightheadedness, new problem Symptoms have been present for the past few months.  Has occasional symptoms of lightheadedness and like she is going to pass out while standing.  This does not happen all the time.  Occurs randomly, though mostly while standing.  She has never passed out.  No weakness or numbness.  No headache.  No chest pain or shortness of breath.  No palpitations.  No specific treatments tried.  Symptoms all last for a few minutes and then subside.  No other obvious alleviating or aggravating factors.  ROS: Per HPI  PMH: She reports that she has never smoked. She has never used smokeless tobacco. Her alcohol and drug histories are not on file.  Objective:  Physical Exam: BP 132/80 (BP Location: Left Arm, Patient Position: Sitting, Cuff Size: Normal)   Pulse 88   Temp 97.8 F (36.6 C) (Oral)   Ht 5\' 4"  (1.626 m)   Wt 140 lb (63.5 kg)   SpO2 96%   BMI 24.03 kg/m   Gen: NAD, resting comfortably HEENT: Right TM with small perforation and otosclerosis.  Left TM with otosclerosis.  Oropharynx clear. CV: Occasional ectopic beat, otherwise regular.  Soft 2 out of 6 systolic murmur noted.  Pulm: NWOB, CTAB with no crackles, wheezes, or rhonchi GI: Normal bowel sounds present. Soft, Nontender, Nondistended. MSK: No edema, cyanosis, or clubbing noted Skin: Warm, dry Neuro: Cranial nerves II through XII intact.  Strength 5 upper and lower extremities bilaterally. Psych: Normal affect and thought content  Assessment/Plan:  Lightheadedness Likely secondary to mild dehydration.  She has an occasional ectopic beat on her cardiac exam and a soft murmur, but no other significant cardiac findings.  She had recent CBC which was at her baseline.  CMET also within normal limits about a week ago.  Discussed treatment options with patient.  She  declined EKG and echocardiogram.  Encourage patient to increase her fluid intake to at least half of a gallon to gallon of water per day.  Discussed reasons to return to care.  His symptoms worsen or do not improve with increase fluids, would pursue cardiac work-up including EKG and echocardiogram.  Preventative Healthcare Patient was instructed to return soon for AWV. Health Maintenance Due  Topic Date Due  . PNA vac Low Risk Adult (2 of 2 - PCV13) 01/09/2003  . INFLUENZA VACCINE  11/08/2017   Time Spent: I spent >40 minutes face-to-face with the patient, with more than half spent on counseling for possible etiologies and treatment plan for her lightheadedness.  Algis Greenhouse. Jerline Pain, MD 11/28/2017 9:31 AM

## 2018-01-01 ENCOUNTER — Ambulatory Visit (INDEPENDENT_AMBULATORY_CARE_PROVIDER_SITE_OTHER): Payer: PPO

## 2018-01-01 ENCOUNTER — Encounter: Payer: Self-pay | Admitting: Family Medicine

## 2018-01-01 ENCOUNTER — Ambulatory Visit (INDEPENDENT_AMBULATORY_CARE_PROVIDER_SITE_OTHER): Payer: PPO | Admitting: Family Medicine

## 2018-01-01 VITALS — BP 130/74 | HR 74 | Temp 98.4°F | Ht 64.0 in | Wt 141.0 lb

## 2018-01-01 DIAGNOSIS — M533 Sacrococcygeal disorders, not elsewhere classified: Secondary | ICD-10-CM | POA: Diagnosis not present

## 2018-01-01 DIAGNOSIS — Z23 Encounter for immunization: Secondary | ICD-10-CM | POA: Diagnosis not present

## 2018-01-01 DIAGNOSIS — S3992XA Unspecified injury of lower back, initial encounter: Secondary | ICD-10-CM | POA: Diagnosis not present

## 2018-01-01 NOTE — Patient Instructions (Addendum)
It was very nice to see you today!  Please take tylenol 1000mg  three times daily as needed for pain.  Please use a donut pillow.  Let me know if your symptoms worsen or do not improve over the next 2-4 weeks.  Take care, Dr Jerline Pain

## 2018-01-01 NOTE — Progress Notes (Signed)
   Subjective:  [name] [name] is a 84 y.o. female who presents today for same-day appointment with a chief complaint of back pain and fall.   HPI:  Back Pain, Acute problem Started 2 days ago. Patient got up to turn off her ceiling fan when she lost balance and fell onto her buttocks.  Immediately noticed pain to the area.  He has persisted for the last couple of days.  She has been using a topical cream which helps some.  No back pain.  No hip pain.  No weakness or numbness.  She denies any dizziness or loss of consciousness during the fall.  Thinks that she may have tripped over her shoes.  ROS: Per HPI  Objective:  Physical Exam: BP 130/74 (BP Location: Left Arm, Patient Position: Sitting, Cuff Size: Normal)   Pulse 74   Temp 98.4 F (36.9 C) (Oral)   Ht 5\' 4"  (1.626 m)   Wt 141 lb (64 kg)   SpO2 98%   BMI 24.20 kg/m   Gen: NAD, resting comfortably MSK:  -Back: No deformities.  Nontender to palpation along spinous processes.  Tender to palpation along sacrum and coccyx near the gluteal cleft. Neuro: Moves all extremities spontaneously.  Normal gait.  Sensation light touch intact distally.  Assessment/Plan:  Coccyx pain Plain film today without obvious fracture based on my read.  Will await radiology read.  Pain likely secondary to contusion.  No red flag signs or symptoms.  Offered prescription for tramadol, however patient declined.  Recommended Tylenol 1000 mg 3 times daily as needed.  Also recommended use of the donut cushion.  Discussed reasons to return to care.  Follow-up as needed.  Algis Greenhouse. Jerline Pain, MD 01/01/2018 12:22 PM

## 2018-01-02 NOTE — Progress Notes (Signed)
Please inform patient of the following:  The radiologist did not see any definitive fractures on her xray. I would like for her to do the things we discussed and let me know if symptoms do not improve over the next few weeks.  Algis Greenhouse. Jerline Pain, MD 01/02/2018 9:24 AM

## 2018-04-05 ENCOUNTER — Encounter: Payer: Self-pay | Admitting: Family Medicine

## 2018-04-05 ENCOUNTER — Ambulatory Visit (INDEPENDENT_AMBULATORY_CARE_PROVIDER_SITE_OTHER): Payer: PPO | Admitting: Family Medicine

## 2018-04-05 VITALS — BP 124/68 | HR 67 | Temp 98.3°F | Ht 64.0 in | Wt 145.0 lb

## 2018-04-05 DIAGNOSIS — R21 Rash and other nonspecific skin eruption: Secondary | ICD-10-CM | POA: Diagnosis not present

## 2018-04-05 DIAGNOSIS — R059 Cough, unspecified: Secondary | ICD-10-CM

## 2018-04-05 DIAGNOSIS — R05 Cough: Secondary | ICD-10-CM

## 2018-04-05 DIAGNOSIS — B029 Zoster without complications: Secondary | ICD-10-CM | POA: Insufficient documentation

## 2018-04-05 MED ORDER — BENZONATATE 200 MG PO CAPS: 200.0000 mg | ORAL_CAPSULE | Freq: Two times a day (BID) | ORAL | 0 refills | Status: DC | PRN

## 2018-04-05 MED ORDER — AZITHROMYCIN 250 MG PO TABS: ORAL_TABLET | ORAL | 0 refills | Status: DC

## 2018-04-05 MED ORDER — IPRATROPIUM BROMIDE 0.06 % NA SOLN: 2.0000 | Freq: Four times a day (QID) | NASAL | 0 refills | Status: DC

## 2018-04-05 MED ORDER — METHYLPREDNISOLONE ACETATE 80 MG/ML IJ SUSP
80.0000 mg | Freq: Once | INTRAMUSCULAR | Status: AC
  Administered 2018-04-05: 80 mg via INTRAMUSCULAR

## 2018-04-05 NOTE — Patient Instructions (Addendum)
Start the atrovent and zpack.   Start tessalon for your cough.  Please stay well hydrated.  You can take tylenol and/or motrin as needed for low grade fever and pain.  Please let me know if your symptoms worsen or fail to improve.  Take care, Dr Jerline Pain

## 2018-04-05 NOTE — Assessment & Plan Note (Signed)
Concern for shingles versus genital herpes.  Given that lesions are scabbed over, do not need further treatment at this point.  Advised patient to come into the office when she has a recurrent outbreak.

## 2018-04-05 NOTE — Progress Notes (Signed)
   Subjective:  [name] [name] is a 84 y.o. female who presents today for same-day appointment with a chief complaint of cough.   HPI:  Cough, Acute problem Started 3 days ago. Worsened over that time. Cough is productive of thick, yellow sputum. Associated symptoms include drainage and malaise.  She has tried Tylenol and Zyrtec with no improvement.  No fever or chills.  No myalgias.  No known sick contacts.  No other treatments tried.  Rash Several year history.  Intermittent in nature.  Located on left buttocks.  Has used topical cream in the past.  ROS: Per HPI  PMH: She reports that she has never smoked. She has never used smokeless tobacco. No history on file for alcohol and drug.  Objective:  Physical Exam: BP 124/68 (BP Location: Right Arm, Patient Position: Sitting, Cuff Size: Normal)   Pulse 67   Temp 98.3 F (36.8 C) (Oral)   Ht 5\' 4"  (1.626 m)   Wt 145 lb (65.8 kg)   SpO2 95%   BMI 24.89 kg/m   Gen: NAD, resting comfortably HEENT: TMs with clear effusion.  OP slightly erythematous with no exudate.  He has mucosa erythematous and boggy bilaterally. CV: RRR with no murmurs appreciated Pulm: NWOB, Bibasilar wheezes and crackles. Skin: Erythematous rash in dermatomal distribution on left buttocks.  Several healed over scabs.  Assessment/Plan:  Cough Exam consistent with bronchitis.  Start Z-Pak.  Start Atrovent nasal spray.  Start Tessalon for cough.  Will give 87 Depo-Medrol today.  Encouraged good oral hydration.  Recommended Tylenol/or Motrin as needed.  Discussed reasons to return to care.  Follow-up as needed.  Rash Concern for shingles versus genital herpes.  Given that lesions are scabbed over, do not need further treatment at this point.  Advised patient to come into the office when she has a recurrent outbreak.  Algis Greenhouse. Jerline Pain, MD 04/05/2018 4:57 PM

## 2018-04-19 ENCOUNTER — Ambulatory Visit: Payer: Self-pay

## 2018-04-19 ENCOUNTER — Encounter: Payer: Self-pay | Admitting: Family Medicine

## 2018-04-19 ENCOUNTER — Ambulatory Visit (INDEPENDENT_AMBULATORY_CARE_PROVIDER_SITE_OTHER): Payer: PPO | Admitting: Family Medicine

## 2018-04-19 VITALS — BP 142/72 | HR 67 | Temp 97.7°F | Ht 64.0 in | Wt 142.2 lb

## 2018-04-19 DIAGNOSIS — G4762 Sleep related leg cramps: Secondary | ICD-10-CM

## 2018-04-19 DIAGNOSIS — R059 Cough, unspecified: Secondary | ICD-10-CM

## 2018-04-19 DIAGNOSIS — R05 Cough: Secondary | ICD-10-CM | POA: Diagnosis not present

## 2018-04-19 MED ORDER — IPRATROPIUM BROMIDE 0.06 % NA SOLN: 2.0000 | Freq: Four times a day (QID) | NASAL | 0 refills | Status: DC

## 2018-04-19 MED ORDER — GUAIFENESIN-CODEINE 100-10 MG/5ML PO SOLN: 5.0000 mL | Freq: Three times a day (TID) | ORAL | 0 refills | Status: DC | PRN

## 2018-04-19 NOTE — Telephone Encounter (Signed)
Noted  

## 2018-04-19 NOTE — Telephone Encounter (Signed)
Call returned to patient who states she has been treated for cough since 04/06/2019.  She has completed her Azithromycin. She is taking the benzonatate as prescribed and using the nasal drops as prescribed.  She states her breathing is fine until she starts to cough.  She says that she has to pull thick yellow phlegm form her throat.  She reports that there is a lot of phlegm.  She does not think she has fever.  She is having problems sleeping because of the cough.  She also say she has been getting leg cramps each night and would like to discuss this with the doctor. She states her sinuses are runny at times and stuffy at times. Pt has scheduled appointment today. NT reviewed appointment time with patient.Care advice read to patient.  Pt verbalized understanding of all instructions. Reason for Disposition . [1] Continuous (nonstop) coughing interferes with work or school AND [2] no improvement using cough treatment per Care Advice  Answer Assessment - Initial Assessment Questions 1. ONSET: "When did the cough begin? End of december      2. SEVERITY: "How bad is the cough today?"      Coughing at night not able to sleep 3. RESPIRATORY DISTRESS: "Describe your breathing."      Breathing well until cough up thick yellow phlegm 4. FEVER: "Do you have a fever?" If so, ask: "What is your temperature, how was it measured, and when did it start?"     No 5. SPUTUM: "Describe the color of your sputum" (clear, white, yellow, green)     yellow 6. HEMOPTYSIS: "Are you coughing up any blood?" If so ask: "How much?" (flecks, streaks, tablespoons, etc.)     no 7. CARDIAC HISTORY: "Do you have any history of heart disease?" (e.g., heart attack, congestive heart failure)      No  8. LUNG HISTORY: "Do you have any history of lung disease?"  (e.g., pulmonary embolus, asthma, emphysema)     no 9. PE RISK FACTORS: "Do you have a history of blood clots?" (or: recent major surgery, recent prolonged travel,  bedridden)     no 10. OTHER SYMPTOMS: "Do you have any other symptoms?" (e.g., runny nose, wheezing, chest pain)       Runny nose to congested nose 11. PREGNANCY: "Is there any chance you are pregnant?" "When was your last menstrual period?"       N/A 12. TRAVEL: "Have you traveled out of the country in the last month?" (e.g., travel history, exposures)      No  Protocols used: [place]

## 2018-04-19 NOTE — Progress Notes (Signed)
   Subjective:  [name] [name] is a 85 y.o. female who presents today for same-day appointment with a chief complaint of cough.   HPI:  Cough, Acute problem Started last night. She was diagnosed with bronchitis several weeks ago and treated with Atrovent nasal spray, azithromycin, and Depo-Medrol.  Symptoms resolved after treatment.  She tried using some leftover Atrovent nasal spray yesterday which is helped some.  Associated with some nasal congestion.  No fevers or chills.  No other treatments tried.  No other obvious alleviating or aggravating factors.   Leg Cramps Patient also reports that she will frequently get leg cramps early in the morning.  These are very severe.  She usually has to stretch out her legs to get them to go away.  This is been going on for quite a while but seems to have worsened recently.  No specific treatments tried.  ROS: Per HPI  PMH: She reports that she has never smoked. She has never used smokeless tobacco. No history on file for alcohol and drug.  Objective:  Physical Exam: BP (!) 142/72 (BP Location: Left Arm, Patient Position: Sitting, Cuff Size: Normal)   Pulse 67   Temp 97.7 F (36.5 C) (Oral)   Ht 5\' 4"  (1.626 m)   Wt 142 lb 3.2 oz (64.5 kg)   SpO2 96%   BMI 24.41 kg/m   Gen: NAD, resting comfortably HEENT: TMs clear. OP erythematous with no exudate. Nasal mucosa erythematous with thick, white discharge CV: RRR with no murmurs appreciated Pulm: NWOB, CTAB with no crackles, wheezes, or rhonchi MSK: Bilateral lower extremities with varicosities.  No calf tenderness.  Neurovascular intact distally.  Strength out of 5 in all directions.  Assessment/Plan:  Cough Likely secondary to postnasal drip secondary to viral URI.  No signs of bacterial infection.  Continue Atrovent nasal spray.  We will send in guaifenesin as codeine cough syrup.  No indication for antibiotic treatment today.  Recommended good oral hydration.  Discussed reasons return to  care and seek emergent care.  Leg Cramps No red flags.  Recommended good oral hydration and daily stretching exercises.  She will start her stationary bike again.  [name] [name]. [name] Pain, MD 04/19/2018 3:39 PM

## 2018-04-19 NOTE — Patient Instructions (Signed)
It was very nice to see you today!  Please use the cough syrup and nasal spray.  Try stretching your legs at night and working on your exercises.   Let me know if your symptoms worsen are are not improving.   Take care, Dr Jerline Pain

## 2018-05-07 ENCOUNTER — Encounter: Payer: Self-pay | Admitting: Family Medicine

## 2018-05-07 ENCOUNTER — Ambulatory Visit (INDEPENDENT_AMBULATORY_CARE_PROVIDER_SITE_OTHER): Payer: PPO | Admitting: Family Medicine

## 2018-05-07 VITALS — BP 134/76 | HR 80 | Temp 97.9°F | Ht 64.0 in | Wt 142.0 lb

## 2018-05-07 DIAGNOSIS — N183 Chronic kidney disease, stage 3 unspecified: Secondary | ICD-10-CM

## 2018-05-07 DIAGNOSIS — B029 Zoster without complications: Secondary | ICD-10-CM

## 2018-05-07 MED ORDER — VALACYCLOVIR HCL 500 MG PO TABS: ORAL_TABLET | ORAL | 1 refills | Status: DC

## 2018-05-07 MED ORDER — TRIAMCINOLONE ACETONIDE 0.1 % EX CREA: 1.0000 "application " | TOPICAL_CREAM | Freq: Two times a day (BID) | CUTANEOUS | 0 refills | Status: DC

## 2018-05-07 NOTE — Patient Instructions (Signed)
It was very nice to see you today!  Please take the Valtrex 1 pill daily for 5 days.  You can use this as needed if it ever comes back.  Please use the cream as needed for the itching.  Please let me know if your symptoms worsen or do not improve the next few days.  Take care, Dr Jerline Pain

## 2018-05-07 NOTE — Assessment & Plan Note (Signed)
No red flags.  Appearance of rash consistent with zoster.  Start renally dosed Valtrex 500 mg daily x5 days.  Also start topical triamcinolone as needed for pruritus.  Discussed reasons to return to care and seek emergent care.  We will send in additional Valtrex to use as needed for recurrent outbreaks.

## 2018-05-07 NOTE — Progress Notes (Signed)
   Chief Complaint:  [name] [name] is a 85 y.o. female who presents for same day appointment with a chief complaint of Rash.   Assessment/Plan:  Shingles No red flags.  Appearance of rash consistent with zoster.  Start renally dosed Valtrex 500 mg daily x5 days.  Also start topical triamcinolone as needed for pruritus.  Discussed reasons to return to care and seek emergent care.  We will send in additional Valtrex to use as needed for recurrent outbreaks.     Subjective:  HPI:  Rash, acute problem Started 4 days ago.  Located on buttocks.  Symptoms seem to have worsened over the last few days.  Rash is very itchy.  Occasionally burns when scratching it.  She has tried topical Vaseline and cortisone cream with no improvement.  No fevers or chills.  No drainage.  No obvious triggering events. No other obvious alleviating or aggravating factors.   ROS: Per HPI  PMH: She reports that she has never smoked. She has never used smokeless tobacco. No history on file for alcohol and drug.      Objective:  Physical Exam: BP 134/76 (BP Location: Left Arm, Patient Position: Sitting, Cuff Size: Normal)   Pulse 80   Temp 97.9 F (36.6 C) (Oral)   Ht 5\' 4"  (1.626 m)   Wt 142 lb (64.4 kg)   SpO2 95%   BMI 24.37 kg/m   Gen: NAD, resting comfortably Skin: Erythematous, vesicular rash with several excoriations noted at right lower back     Jourdin Gens M. Jerline Pain, MD 05/07/2018 9:38 AM

## 2018-05-27 ENCOUNTER — Ambulatory Visit: Payer: PPO | Admitting: Endocrinology

## 2018-06-12 ENCOUNTER — Ambulatory Visit: Payer: PPO | Admitting: Endocrinology

## 2018-06-13 ENCOUNTER — Encounter: Payer: Self-pay | Admitting: Physician Assistant

## 2018-06-13 ENCOUNTER — Ambulatory Visit (INDEPENDENT_AMBULATORY_CARE_PROVIDER_SITE_OTHER): Payer: PPO | Admitting: Physician Assistant

## 2018-06-13 VITALS — BP 132/80 | HR 88 | Temp 97.8°F | Ht 64.0 in | Wt 141.5 lb

## 2018-06-13 DIAGNOSIS — R059 Cough, unspecified: Secondary | ICD-10-CM

## 2018-06-13 DIAGNOSIS — H6123 Impacted cerumen, bilateral: Secondary | ICD-10-CM | POA: Diagnosis not present

## 2018-06-13 DIAGNOSIS — R05 Cough: Secondary | ICD-10-CM

## 2018-06-13 DIAGNOSIS — H906 Mixed conductive and sensorineural hearing loss, bilateral: Secondary | ICD-10-CM | POA: Diagnosis not present

## 2018-06-13 MED ORDER — AZITHROMYCIN 250 MG PO TABS: ORAL_TABLET | ORAL | 0 refills | Status: DC

## 2018-06-13 NOTE — Patient Instructions (Signed)
It was great to see you!  Use medication as prescribed: azithromcyin antibiotic   Push fluids and get plenty of rest. Please return if you are not improving as expected, or if you have high fevers (>101.5) or difficulty swallowing or worsening productive cough.  Call clinic with questions.  I hope you start feeling better soon!

## 2018-06-13 NOTE — Progress Notes (Signed)
[name] [name] is a 85 y.o. female here for a new problem.  I acted as a Education administrator for [employer], PA-C Anselmo Pickler, LPN  History of Present Illness:   Chief Complaint  Patient presents with  . Cough    x2 days    Cough  This is a new problem. Episode onset: Started 2 days ago. The problem has been gradually worsening. The problem occurs every few hours. The cough is productive of sputum (expectorating white). Associated symptoms include chills, a sore throat and shortness of breath. Pertinent negatives include no fever, headaches, nasal congestion or postnasal drip. She has tried prescription cough suppressant for the symptoms. Her past medical history is significant for bronchitis.    Past Medical History:  Diagnosis Date  . ALLERGIC RHINITIS 11/02/2006  . CKD (chronic kidney disease), stage III ([place])   . HYPERLIPIDEMIA 11/04/2006  . HYPOTHYROIDISM, POSTSURGICAL 03/28/2007  . Malignant neoplasm of thyroid gland ([place]) 03/28/2007   5/07: righte lobectomy--path shows 3 cm follicular ca 0/81: completion left lobectomy, no more tumor seen 09/07: thyroglobulin 0.2 9ab neg) 11/07: i-131 rx, 101 mci 6/08: thyroglogulin 0.2 (ab neg) , hypothyroid body scan neg (clinically tolerated hypothyroidism poorly) 06/09: tg undetectable (ab=1.5, normal) 12/10: tg undetectable (ab neg) 6/11: tg undectable (ab neg)  . OSTEOPENIA 11/02/2006     Social History   Socioeconomic History  . Marital status: Married    Spouse name: Not on file  . Number of children: Not on file  . Years of education: Not on file  . Highest education level: Not on file  Occupational History  . Not on file  Social Needs  . Financial resource strain: Not on file  . Food insecurity:    Worry: Not on file    Inability: Not on file  . Transportation needs:    Medical: Not on file    Non-medical: Not on file  Tobacco Use  . Smoking status: Never Smoker  . Smokeless tobacco: Never Used  Substance and Sexual Activity   . Alcohol use: Not on file  . Drug use: Not on file  . Sexual activity: Not on file  Lifestyle  . Physical activity:    Days per week: Not on file    Minutes per session: Not on file  . Stress: Not on file  Relationships  . Social connections:    Talks on phone: Not on file    Gets together: Not on file    Attends religious service: Not on file    Active member of club or organization: Not on file    Attends meetings of clubs or organizations: Not on file    Relationship status: Not on file  . Intimate partner violence:    Fear of current or ex partner: Not on file    Emotionally abused: Not on file    Physically abused: Not on file    Forced sexual activity: Not on file  Other Topics Concern  . Not on file  Social History Narrative   Doesn't drive    Past Surgical History:  Procedure Laterality Date  . ABDOMINAL HYSTERECTOMY    . CERVICAL POLYPECTOMY    . OPEN REDUCTION INTERNAL FIXATION (ORIF) DISTAL RADIAL FRACTURE Left 01/14/2014   Procedure: OPEN REDUCTION INTERNAL FIXATION DISTAL RADIAL FRACTURE;  Surgeon: Linna Hoff, MD;  Location: [facility];  Service: Orthopedics;  Laterality: Left;    Family History  Problem Relation Age of Onset  . Diabetes Sister  No Known Allergies  Current Medications:   Current Outpatient Medications:  .  calcitRIOL (ROCALTROL) 0.25 MCG capsule, Take 1 capsule (0.25 mcg total) by mouth 4 (four) times a week., Disp: 18 capsule, Rfl: 11 .  calcium carbonate (OS-CAL) 600 MG TABS, Take 600 mg by mouth daily. , Disp: , Rfl:  .  cetirizine (ZYRTEC) 10 MG tablet, Take 10 mg by mouth daily., Disp: , Rfl:  .  docusate sodium (COLACE) 100 MG capsule, Take 100 mg by mouth daily., Disp: , Rfl:  .  ipratropium (ATROVENT) 0.06 % nasal spray, Place 2 sprays into both nostrils 4 (four) times daily., Disp: 15 mL, Rfl: 0 .  levothyroxine (SYNTHROID, LEVOTHROID) 75 MCG tablet, Take 1 tablet (75 mcg total) by mouth daily before breakfast., Disp: 90  tablet, Rfl: 3 .  Menthol-Ascorbic Acid (LUDENS COUGH DROPS MT), Use as directed 1 drop in the mouth or throat as needed (dry throat)., Disp: , Rfl:  .  Multiple Vitamins-Minerals (MULTIVITAMINS THER. W/MINERALS) TABS, Take 1 tablet by mouth daily.  , Disp: , Rfl:  .  triamcinolone cream (KENALOG) 0.1 %, Apply 1 application topically 2 (two) times daily., Disp: 30 g, Rfl: 0 .  valACYclovir (VALTREX) 500 MG tablet, Take 1 pill daily for 5 days for outbreaks., Disp: 30 tablet, Rfl: 1 .  vitamin C (ASCORBIC ACID) 500 MG tablet, Take 1 tablet (500 mg total) by mouth daily., Disp: 50 tablet, Rfl: 0 .  azithromycin (ZITHROMAX) 250 MG tablet, Take two tablets on day 1, then one tablet daily x 4 days, Disp: 6 tablet, Rfl: 0 .  guaiFENesin-codeine 100-10 MG/5ML syrup, Take 5 mLs by mouth 3 (three) times daily as needed for cough. (Patient not taking: Reported on 06/13/2018), Disp: 120 mL, Rfl: 0   Review of Systems:   Review of Systems  Constitutional: Positive for chills. Negative for fever.  HENT: Positive for sore throat. Negative for postnasal drip.   Respiratory: Positive for cough and shortness of breath.   Neurological: Negative for headaches.    Vitals:   Vitals:   06/13/18 0847  BP: 132/80  Pulse: 88  Temp: 97.8 F (36.6 C)  TempSrc: Oral  SpO2: 95%  Weight: 141 lb 8 oz (64.2 kg)  Height: 5\' 4"  (1.626 m)     Body mass index is 24.29 kg/m.  Physical Exam:   Physical Exam Vitals signs and nursing note reviewed.  Constitutional:      General: She is not in acute distress.    Appearance: She is well-developed. She is not ill-appearing or toxic-appearing.  HENT:     Head: Normocephalic and atraumatic.     Right Ear: Ear canal and external ear normal.     Left Ear: Ear canal and external ear normal.     Ears:     Comments: Bilateral ears with significant cerumen impaction    Nose: Nose normal.     Right Sinus: No maxillary sinus tenderness or frontal sinus tenderness.      Left Sinus: No maxillary sinus tenderness or frontal sinus tenderness.     Mouth/Throat:     Pharynx: Uvula midline. No posterior oropharyngeal erythema.  Eyes:     General: Lids are normal.     Conjunctiva/sclera: Conjunctivae normal.  Neck:     Trachea: Trachea normal.  Cardiovascular:     Rate and Rhythm: Normal rate and regular rhythm.     Heart sounds: Normal heart sounds, S1 normal and S2 normal.  Pulmonary:  Effort: Pulmonary effort is normal.     Breath sounds: Normal breath sounds. No decreased breath sounds, wheezing, rhonchi or rales.  Lymphadenopathy:     Cervical: No cervical adenopathy.  Skin:    General: Skin is warm and dry.  Neurological:     Mental Status: She is alert.  Psychiatric:        Speech: Speech normal.        Behavior: Behavior normal. Behavior is cooperative.      Assessment and Plan:   Kiyani was seen today for cough.  Diagnoses and all orders for this visit:  Cough  Other orders -     azithromycin (ZITHROMAX) 250 MG tablet; Take two tablets on day 1, then one tablet daily x 4 days   No red flags on exam.  She has a history of bronchitis, and I suspect that she is developing bronchitis even though it is early.  Will give antibiotic as we head into the weekend.  She is going to her ENT this afternoon for ear lavage.  Discussed taking medications as prescribed. Reviewed return precautions including worsening fever, SOB, worsening cough or other concerns. Push fluids and rest. I recommend that patient follow-up if symptoms worsen or persist despite treatment x 7-10 days, sooner if needed.   . Reviewed expectations re: course of current medical issues. . Discussed self-management of symptoms. . Outlined signs and symptoms indicating need for more acute intervention. . Patient verbalized understanding and all questions were answered. . See orders for this visit as documented in the electronic medical record. . Patient received an After-Visit  Summary.  CMA or LPN served as scribe during this visit. History, Physical, and Plan performed by medical provider. The above documentation has been reviewed and is accurate and complete.   Inda Coke, PA-C

## 2018-07-03 ENCOUNTER — Other Ambulatory Visit: Payer: Self-pay | Admitting: Physician Assistant

## 2018-08-14 ENCOUNTER — Ambulatory Visit (INDEPENDENT_AMBULATORY_CARE_PROVIDER_SITE_OTHER): Payer: PPO | Admitting: Endocrinology

## 2018-08-14 ENCOUNTER — Ambulatory Visit: Payer: PPO | Admitting: Endocrinology

## 2018-08-14 ENCOUNTER — Encounter: Payer: Self-pay | Admitting: Endocrinology

## 2018-08-14 ENCOUNTER — Other Ambulatory Visit: Payer: Self-pay

## 2018-08-14 DIAGNOSIS — C73 Malignant neoplasm of thyroid gland: Secondary | ICD-10-CM

## 2018-08-14 DIAGNOSIS — E208 Other hypoparathyroidism: Secondary | ICD-10-CM

## 2018-08-14 DIAGNOSIS — E890 Postprocedural hypothyroidism: Secondary | ICD-10-CM | POA: Diagnosis not present

## 2018-08-14 NOTE — Progress Notes (Signed)
Subjective:    Patient ID: [name] [name], female    DOB: [birth year], 85 y.o.   MRN: [id number]  HPI  telehealth visit today via phone x 15 minutes  Alternatives to telehealth are presented to this patient, and the patient agrees to the telehealth visit. Pt is advised of the cost of the visit, and agrees to this, also.   Patient is at home, and I am at the office.   Persons attending the telehealth visit: the patient and I Stage-2 follicular adenocarcinoma of the thyroid.    5/07:  right lobectomy--path shows 3 cm right lobe follicular ca (T2 N0 M0). 8/07: completion left lobectomy, no more tumor seen.   9/07  thyroglobulin 11.8 (ab neg).   11/07: RAI, 101 mCi 6/08 thyroglogulin 0.2 (ab neg) 6/08: hypothyroid body scan neg (she tolerated thyroid hormone withdrawal poorly) 6/09 tg undetectable (ab=1.5) 12/10: tg undetectable (ab neg) 6/11:  tg undetectable (ab neg) 6/12: tg undetectable (ab neg) 6/13: tg undetectable (ab neg) 6/15: tg undetectable (ab neg) 6/16: tg undetectable (ab neg).   8/17: tg=0.1 (ab neg).  2/19: tg undetectable (ab neg). She denies any nodule at the neck.  Postsurgical hypothyroidism: she takes synthroid as rx'ed Postsurgical hypoparathyroidism: She has muscle cramps of the legs.  Pt says she takes meds as rx'ed.    Past Medical History:  Diagnosis Date  . ALLERGIC RHINITIS 11/02/2006  . CKD (chronic kidney disease), stage III ([place])   . HYPERLIPIDEMIA 11/04/2006  . HYPOTHYROIDISM, POSTSURGICAL 03/28/2007  . Malignant neoplasm of thyroid gland ([place]) 03/28/2007   5/07: righte lobectomy--path shows 3 cm follicular ca 0/25: completion left lobectomy, no more tumor seen 09/07: thyroglobulin 0.2 9ab neg) 11/07: i-131 rx, 101 mci 6/08: thyroglogulin 0.2 (ab neg) , hypothyroid body scan neg (clinically tolerated hypothyroidism poorly) 06/09: tg undetectable (ab=1.5, normal) 12/10: tg undetectable (ab neg) 6/11: tg undectable (ab neg)  . OSTEOPENIA 11/02/2006     Past Surgical History:  Procedure Laterality Date  . ABDOMINAL HYSTERECTOMY    . CERVICAL POLYPECTOMY    . OPEN REDUCTION INTERNAL FIXATION (ORIF) DISTAL RADIAL FRACTURE Left 01/14/2014   Procedure: OPEN REDUCTION INTERNAL FIXATION DISTAL RADIAL FRACTURE;  Surgeon: Linna Hoff, MD;  Location: [facility];  Service: Orthopedics;  Laterality: Left;    Social History   Socioeconomic History  . Marital status: Married    Spouse name: Not on file  . Number of children: Not on file  . Years of education: Not on file  . Highest education level: Not on file  Occupational History  . Not on file  Social Needs  . Financial resource strain: Not on file  . Food insecurity:    Worry: Not on file    Inability: Not on file  . Transportation needs:    Medical: Not on file    Non-medical: Not on file  Tobacco Use  . Smoking status: Never Smoker  . Smokeless tobacco: Never Used  Substance and Sexual Activity  . Alcohol use: Not on file  . Drug use: Not on file  . Sexual activity: Not on file  Lifestyle  . Physical activity:    Days per week: Not on file    Minutes per session: Not on file  . Stress: Not on file  Relationships  . Social connections:    Talks on phone: Not on file    Gets together: Not on file    Attends religious service: Not on file    Active member  of club or organization: Not on file    Attends meetings of clubs or organizations: Not on file    Relationship status: Not on file  . Intimate partner violence:    Fear of current or ex partner: Not on file    Emotionally abused: Not on file    Physically abused: Not on file    Forced sexual activity: Not on file  Other Topics Concern  . Not on file  Social History Narrative   Doesn't drive    Current Outpatient Medications on File Prior to Visit  Medication Sig Dispense Refill  . azithromycin (ZITHROMAX) 250 MG tablet Take two tablets on day 1, then one tablet daily x 4 days 6 tablet 0  . calcitRIOL (ROCALTROL)  0.25 MCG capsule Take 1 capsule (0.25 mcg total) by mouth 4 (four) times a week. 18 capsule 11  . calcium carbonate (OS-CAL) 600 MG TABS Take 600 mg by mouth daily.     . cetirizine (ZYRTEC) 10 MG tablet Take 10 mg by mouth daily.    Marland Kitchen docusate sodium (COLACE) 100 MG capsule Take 100 mg by mouth daily.    Marland Kitchen guaiFENesin-codeine 100-10 MG/5ML syrup Take 5 mLs by mouth 3 (three) times daily as needed for cough. 120 mL 0  . ipratropium (ATROVENT) 0.06 % nasal spray Place 2 sprays into both nostrils 4 (four) times daily. 15 mL 0  . levothyroxine (SYNTHROID, LEVOTHROID) 75 MCG tablet Take 1 tablet (75 mcg total) by mouth daily before breakfast. 90 tablet 3  . Menthol-Ascorbic Acid (LUDENS COUGH DROPS MT) Use as directed 1 drop in the mouth or throat as needed (dry throat).    . Multiple Vitamins-Minerals (MULTIVITAMINS THER. W/MINERALS) TABS Take 1 tablet by mouth daily.      Marland Kitchen triamcinolone cream (KENALOG) 0.1 % Apply 1 application topically 2 (two) times daily. 30 g 0  . valACYclovir (VALTREX) 500 MG tablet Take 1 pill daily for 5 days for outbreaks. 30 tablet 1  . vitamin C (ASCORBIC ACID) 500 MG tablet Take 1 tablet (500 mg total) by mouth daily. 50 tablet 0   No current facility-administered medications on file prior to visit.     No Known Allergies  Family History  Problem Relation Age of Onset  . Diabetes Sister     Review of Systems She has mild dry skin.    Objective:   Physical Exam      Assessment & Plan:  PTC Hypoparathyroidism Hypothyroidism  Check labs for all 3 of these today.

## 2018-08-15 ENCOUNTER — Other Ambulatory Visit: Payer: PPO

## 2018-08-19 ENCOUNTER — Other Ambulatory Visit: Payer: Self-pay

## 2018-08-19 ENCOUNTER — Other Ambulatory Visit: Payer: Self-pay | Admitting: Endocrinology

## 2018-08-19 ENCOUNTER — Other Ambulatory Visit (INDEPENDENT_AMBULATORY_CARE_PROVIDER_SITE_OTHER): Payer: PPO

## 2018-08-19 DIAGNOSIS — E890 Postprocedural hypothyroidism: Secondary | ICD-10-CM | POA: Diagnosis not present

## 2018-08-19 DIAGNOSIS — C73 Malignant neoplasm of thyroid gland: Secondary | ICD-10-CM | POA: Diagnosis not present

## 2018-08-19 DIAGNOSIS — E208 Other hypoparathyroidism: Secondary | ICD-10-CM | POA: Diagnosis not present

## 2018-08-19 LAB — BASIC METABOLIC PANEL
BUN: 17 mg/dL (ref 6–23)
CO2: 32 mEq/L (ref 19–32)
Calcium: 8.2 mg/dL — ABNORMAL LOW (ref 8.4–10.5)
Chloride: 99 mEq/L (ref 96–112)
Creatinine, Ser: 1.03 mg/dL (ref 0.40–1.20)
GFR: 50.5 mL/min — ABNORMAL LOW (ref >60.00)
Glucose, Bld: 98 mg/dL (ref 70–99)
Potassium: 4.2 mEq/L (ref 3.5–5.1)
Sodium: 139 mEq/L (ref 135–145)

## 2018-08-19 LAB — VITAMIN D 25 HYDROXY (VIT D DEFICIENCY, FRACTURES): VITD: 43.31 ng/mL (ref 30.00–100.00)

## 2018-08-19 LAB — TSH: TSH: 22.18 u[IU]/mL — ABNORMAL HIGH (ref 0.35–4.50)

## 2018-08-19 MED ORDER — LEVOTHYROXINE SODIUM 100 MCG PO TABS: 100.0000 ug | ORAL_TABLET | Freq: Every day | ORAL | 3 refills | Status: DC

## 2018-08-20 LAB — THYROGLOBULIN ANTIBODY: Thyroglobulin Ab: <1 IU/mL (ref <=1)

## 2018-08-20 LAB — THYROGLOBULIN LEVEL: Thyroglobulin: <0.1 ng/mL — ABNORMAL LOW

## 2018-08-20 LAB — PTH, INTACT AND CALCIUM
Calcium: 8.4 mg/dL — ABNORMAL LOW (ref 8.6–10.4)
PTH: 12 pg/mL — ABNORMAL LOW (ref 14–64)

## 2018-09-25 ENCOUNTER — Ambulatory Visit (INDEPENDENT_AMBULATORY_CARE_PROVIDER_SITE_OTHER): Payer: PPO | Admitting: Family Medicine

## 2018-09-25 ENCOUNTER — Encounter: Payer: Self-pay | Admitting: Family Medicine

## 2018-09-25 DIAGNOSIS — R200 Anesthesia of skin: Secondary | ICD-10-CM

## 2018-09-25 DIAGNOSIS — L84 Corns and callosities: Secondary | ICD-10-CM

## 2018-09-25 DIAGNOSIS — S8010XA Contusion of unspecified lower leg, initial encounter: Secondary | ICD-10-CM

## 2018-09-25 NOTE — Assessment & Plan Note (Signed)
Chronic problem.  Stable.  Likely multi-factorial.  She is working with endocrinology to correct her thyroid and calcium abnormalities-hopefully this will help some.  If symptoms persist despite correction of these 2 issues, will need further work-up including B12 level.  May need referral to neurology if continues to be bothersome.

## 2018-09-25 NOTE — Progress Notes (Signed)
    Chief Complaint:  [name] [name] is a 85 y.o. female who presents today for a virtual office visit with a chief complaint of foot pain.   Assessment/Plan:  Ecchymosis Secondary to contusion.  Neurovascular intact distally.  No red flags or signs of infection.  Expect full resolution in the next couple weeks.  Reassured patient.    Corns and callus Chronic problem.  Stable.  No signs of infection.  Offered referral to podiatry however patient declined.  She will continue doing Epson salt soaks.  Numbness Chronic problem.  Stable.  Likely multi-factorial.  She is working with endocrinology to correct her thyroid and calcium abnormalities-hopefully this will help some.  If symptoms persist despite correction of these 2 issues, will need further work-up including B12 level.  May need referral to neurology if continues to be bothersome.     Subjective:  HPI:  Foot Pain Started a couple of days ago. Located on right foot. Patient was walking through a store when she hit her leg against a low shelf.  Had some pain and swelling to the area.  She has been using cold packs to the area which is seem to help.  She has also noticed some increased swelling in her left foot which also seems to be improving.  She has not tried anything for this.  No fevers or chills.  No areas of bleeding.  She has had a few areas of redness that seem to be spreading  Corn and callus She also has issues with calluses on the bottom of her feet.  These been present for several years.  She usually uses a file to grenade on areas.  These are sometimes painful however typically well-tolerated.  She has been using warm water soaks as well.  Paresthesias Several year history.  Located in bilateral feet.  Comes and goes.  No obvious precipitating or triggering events. No other weakness or numbness.   ROS: Per HPI  PMH: She reports that she has never smoked. She has never used smokeless tobacco. No history on file for  alcohol and drug.      Objective/Observations  Physical Exam: Gen: NAD, resting comfortably Pulm: Normal work of breathing Neuro: Grossly normal, moves all extremities Psych: Normal affect and thought content Skin: Scattered ecchymoses on right lower extremity.  Inferior portion of bilateral feet with numerous calluses.  Virtual Visit via Video   I connected with [name] on 09/25/18 at  8:00 AM EDT by a video enabled telemedicine application and verified that I am speaking with the correct person using two identifiers. I discussed the limitations of evaluation and management by telemedicine and the availability of in person appointments. The patient expressed understanding and agreed to proceed.   Patient location: Home Provider location: [facility] participating in the virtual visit: Myself and Patient     Algis Greenhouse. Jerline Pain, MD 09/25/2018 8:27 AM

## 2018-09-25 NOTE — Assessment & Plan Note (Signed)
Chronic problem.  Stable.  No signs of infection.  Offered referral to podiatry however patient declined.  She will continue doing Epson salt soaks.

## 2018-10-15 ENCOUNTER — Emergency Department (HOSPITAL_COMMUNITY): Payer: PPO

## 2018-10-15 ENCOUNTER — Other Ambulatory Visit: Payer: Self-pay

## 2018-10-15 ENCOUNTER — Ambulatory Visit
Admission: EM | Admit: 2018-10-15 | Discharge: 2018-10-15 | Discharge status: Absent without leave | Payer: PPO | Source: Home / Self Care

## 2018-10-15 ENCOUNTER — Ambulatory Visit: Payer: Self-pay | Admitting: Family Medicine

## 2018-10-15 ENCOUNTER — Inpatient Hospital Stay (HOSPITAL_COMMUNITY)
Admission: EM | Admit: 2018-10-15 | Discharge: 2018-10-18 | DRG: 309 | Disposition: A | Discharge status: Home / Self Care | Payer: PPO | Attending: Internal Medicine | Admitting: Internal Medicine

## 2018-10-15 DIAGNOSIS — G9341 Metabolic encephalopathy: Secondary | ICD-10-CM | POA: Diagnosis not present

## 2018-10-15 DIAGNOSIS — I700 Atherosclerosis of aorta: Secondary | ICD-10-CM | POA: Diagnosis not present

## 2018-10-15 DIAGNOSIS — Z79890 Hormone replacement therapy (postmenopausal): Secondary | ICD-10-CM

## 2018-10-15 DIAGNOSIS — R29898 Other symptoms and signs involving the musculoskeletal system: Secondary | ICD-10-CM | POA: Diagnosis not present

## 2018-10-15 DIAGNOSIS — R7989 Other specified abnormal findings of blood chemistry: Secondary | ICD-10-CM | POA: Diagnosis not present

## 2018-10-15 DIAGNOSIS — M8580 Other specified disorders of bone density and structure, unspecified site: Secondary | ICD-10-CM | POA: Diagnosis present

## 2018-10-15 DIAGNOSIS — Z833 Family history of diabetes mellitus: Secondary | ICD-10-CM | POA: Diagnosis not present

## 2018-10-15 DIAGNOSIS — N183 Chronic kidney disease, stage 3 (moderate): Secondary | ICD-10-CM | POA: Diagnosis not present

## 2018-10-15 DIAGNOSIS — Z20828 Contact with and (suspected) exposure to other viral communicable diseases: Secondary | ICD-10-CM | POA: Diagnosis present

## 2018-10-15 DIAGNOSIS — M7989 Other specified soft tissue disorders: Secondary | ICD-10-CM | POA: Diagnosis not present

## 2018-10-15 DIAGNOSIS — Z0389 Encounter for observation for other suspected diseases and conditions ruled out: Secondary | ICD-10-CM | POA: Diagnosis not present

## 2018-10-15 DIAGNOSIS — E785 Hyperlipidemia, unspecified: Secondary | ICD-10-CM | POA: Diagnosis present

## 2018-10-15 DIAGNOSIS — Z85850 Personal history of malignant neoplasm of thyroid: Secondary | ICD-10-CM

## 2018-10-15 DIAGNOSIS — E890 Postprocedural hypothyroidism: Secondary | ICD-10-CM | POA: Diagnosis not present

## 2018-10-15 DIAGNOSIS — B029 Zoster without complications: Secondary | ICD-10-CM | POA: Diagnosis not present

## 2018-10-15 DIAGNOSIS — N390 Urinary tract infection, site not specified: Secondary | ICD-10-CM | POA: Diagnosis present

## 2018-10-15 DIAGNOSIS — I471 Supraventricular tachycardia: Principal | ICD-10-CM | POA: Diagnosis present

## 2018-10-15 DIAGNOSIS — J309 Allergic rhinitis, unspecified: Secondary | ICD-10-CM | POA: Diagnosis not present

## 2018-10-15 DIAGNOSIS — I472 Ventricular tachycardia: Secondary | ICD-10-CM | POA: Diagnosis not present

## 2018-10-15 DIAGNOSIS — I498 Other specified cardiac arrhythmias: Secondary | ICD-10-CM | POA: Diagnosis not present

## 2018-10-15 DIAGNOSIS — I129 Hypertensive chronic kidney disease with stage 1 through stage 4 chronic kidney disease, or unspecified chronic kidney disease: Secondary | ICD-10-CM | POA: Diagnosis present

## 2018-10-15 DIAGNOSIS — R55 Syncope and collapse: Secondary | ICD-10-CM | POA: Diagnosis present

## 2018-10-15 DIAGNOSIS — R410 Disorientation, unspecified: Secondary | ICD-10-CM | POA: Diagnosis not present

## 2018-10-15 DIAGNOSIS — R51 Headache: Secondary | ICD-10-CM | POA: Diagnosis not present

## 2018-10-15 LAB — CBC
HCT: 39.9 % (ref 36.0–46.0)
Hemoglobin: 12.6 g/dL (ref 12.0–15.0)
MCH: 28.3 pg (ref 26.0–34.0)
MCHC: 31.6 g/dL (ref 30.0–36.0)
MCV: 89.5 fL (ref 80.0–100.0)
Platelets: 318 10*3/uL (ref 150–400)
RBC: 4.46 MIL/uL (ref 3.87–5.11)
RDW: 13.4 % (ref 11.5–15.5)
WBC: 8.1 10*3/uL (ref 4.0–10.5)
nRBC: 0 % (ref 0.0–0.2)

## 2018-10-15 LAB — URINALYSIS, ROUTINE W REFLEX MICROSCOPIC
Bilirubin Urine: NEGATIVE
Glucose, UA: NEGATIVE mg/dL
Ketones, ur: NEGATIVE mg/dL
Nitrite: POSITIVE — AB
Protein, ur: NEGATIVE mg/dL
Specific Gravity, Urine: 1.006 (ref 1.005–1.030)
WBC, UA: >50 WBC/hpf — ABNORMAL HIGH (ref 0–5)
pH: 7 (ref 5.0–8.0)

## 2018-10-15 LAB — BASIC METABOLIC PANEL
Anion gap: 10 (ref 5–15)
BUN: 15 mg/dL (ref 8–23)
CO2: 29 mmol/L (ref 22–32)
Calcium: 7.8 mg/dL — ABNORMAL LOW (ref 8.9–10.3)
Chloride: 100 mmol/L (ref 98–111)
Creatinine, Ser: 1 mg/dL (ref 0.44–1.00)
GFR calc Af Amer: 58 mL/min — ABNORMAL LOW (ref >60)
GFR calc non Af Amer: 50 mL/min — ABNORMAL LOW (ref >60)
Glucose, Bld: 110 mg/dL — ABNORMAL HIGH (ref 70–99)
Potassium: 3.8 mmol/L (ref 3.5–5.1)
Sodium: 139 mmol/L (ref 135–145)

## 2018-10-15 LAB — TROPONIN I (HIGH SENSITIVITY)
Troponin I (High Sensitivity): 82 ng/L — ABNORMAL HIGH (ref <18)
Troponin I (High Sensitivity): 84 ng/L — ABNORMAL HIGH (ref <18)

## 2018-10-15 LAB — CBG MONITORING, ED: Glucose-Capillary: 88 mg/dL (ref 70–99)

## 2018-10-15 MED ORDER — SODIUM CHLORIDE 0.9 % IV SOLN
1.0000 g | Freq: Once | INTRAVENOUS | Status: AC
  Administered 2018-10-16: 1 g via INTRAVENOUS
  Filled 2018-10-15: qty 10

## 2018-10-15 MED ORDER — SODIUM CHLORIDE 0.9% FLUSH: 3.0000 mL | Freq: Once | INTRAVENOUS | Status: DC

## 2018-10-15 NOTE — ED Notes (Signed)
Pt's daughter [name]) would like to be contacted at [phone].

## 2018-10-15 NOTE — Telephone Encounter (Signed)
See note

## 2018-10-15 NOTE — ED Notes (Signed)
Attempted to call daughter, left message

## 2018-10-15 NOTE — Telephone Encounter (Signed)
Pt's contact calling, pt present during call. Pt reports dizziness, one episode yesterday, 3 today. States "Like I can't see, don't know what I'm doing, feel like I pass out." Duration 1 minute. States "Things just go black and Im disoriented." States she sits down for a while and goes away.Last episode 1 hour ago per pts contact [name]. States BP 143/74   HR 88. Directed pt to UC/ED. States they will go to UC, aware they may send to ED. Assured pt TN would alert practice.  Reason for Disposition . SEVERE dizziness (e.g., unable to stand, requires support to walk, feels like passing out now)  Answer Assessment - Initial Assessment Questions 1. DESCRIPTION: "Describe your dizziness."     Feel funny for a few moments, "Crazy" CAn't see like I black out. 2. LIGHTHEADED: "Do you feel lightheaded?" (e.g., somewhat faint, woozy, weak upon standing)     yes 3. VERTIGO: "Do you feel like either you or the room is spinning or tilting?" (i.e. vertigo)     no 4. SEVERITY: "How bad is it?"  "Do you feel like you are going to faint?" "Can you stand and walk?"   - MILD - walking normally   - MODERATE - interferes with normal activities (e.g., work, school)    - SEVERE - unable to stand, requires support to walk, feels like passing out now.      moderate 5. ONSET:  "When did the dizziness begin?"     One episode yesterday, several today 6. AGGRAVATING FACTORS: "Does anything make it worse?" (e.g., standing, change in head position)     walking 7. HEART RATE: "Can you tell me your heart rate?" "How many beats in 15 seconds?"  (Note: not all patients can do this)       88 8. CAUSE: "What do you think is causing the dizziness?"     unsure 9. RECURRENT SYMPTOM: "Have you had dizziness before?" If so, ask: "When was the last time?" "What happened that time?"     2017 10. OTHER SYMPTOMS: "Do you have any other symptoms?" (e.g., fever, chest pain, vomiting, diarrhea, bleeding)       Visual changes, "Goes  black"  Protocols used: DIZZINESS Heidi Dach

## 2018-10-15 NOTE — ED Triage Notes (Signed)
Pt reports since yesterday afternoon- she has had multiple episodes of a sudden headache and feeling like she may pass out but has not actually loss consciousness but just feels that way. Denies any pain.

## 2018-10-15 NOTE — ED Notes (Signed)
Pt to CT

## 2018-10-15 NOTE — H&P (Signed)
[name] [name] ERD:[id number] DOB: 04-14-1927 DOA: 10/15/2018     PCP: Vivi Barrack, MD   Outpatient Specialists:     Rocco Pauls  Patient arrived to ER on 10/15/18 at 1726  Patient coming from: home Lives alone,        Chief Complaint:  Chief Complaint  Patient presents with   Near Syncope   Headache    HPI: [name] [name] is a 85 y.o. female with medical history significant of thyroid cancer, hypoparathyroidism, CKD    Presented with intermittent episodes of lightheadedness feels like she is about a pass out cannot see very well everything goes black and she becomes disoriented she has had a number this episodes the past 3 days last episode was today She has had an headache she feels like she is about to pass out but never really did. Patient today went to urgent to get this evaluated She had a chest pain or shortness of breath no palpitations Reports her right leg has been feeling weaker And has swollen some  Infectious risk factors:  Reports none  In  ER RAPID COVID TEST NEGATIVE    Regarding pertinent Chronic problems:       Hypothyroidism:  Lab Results  Component Value Date   TSH 22.18 (H) 08/19/2018   on synthroid    While in ER: Found to have possible UTI Ct head neg CXR unremarkable The following Work up has been ordered so far:  Orders Placed This Encounter  Procedures   SARS Coronavirus 2 (CEPHEID - Performed in [facility] hospital lab), Hosp Order   Urine Culture   CT Head Wo Contrast   DG Chest Port 1 View   MR BRAIN WO CONTRAST   Basic metabolic panel   CBC   Urinalysis, Routine w reflex microscopic   Troponin I (High Sensitivity)   Troponin I (High Sensitivity)   D-dimer, quantitative (not at [facility])   Magnesium   Phosphorus   TSH   Comprehensive metabolic panel   CBC   Diet Heart Room service appropriate? Yes; Fluid consistency: Thin   Cardiac monitoring   Cardiac monitoring   Vital signs   Notify  physician   Up with assistance   If patient diabetic or glucose greater than 140 notify physician for Sliding Scale Insulin Orders   May go off telemetry for tests/procedures   Oral care per nursing protocol   Initiate Oral Care Protocol   Initiate Carrier Fluid Protocol   RN may order General Admission PRN Orders utilizing "General Admission PRN medications" (through manage orders) for the following patient needs: allergy symptoms (Claritin), cold sores (Carmex), cough (Robitussin DM), eye irritation (Liquifilm Tears), hemorrhoids (Tucks), indigestion (Maalox), minor skin irritation (Hydrocortisone Cream), muscle pain Suezanne Jacquet Gay), nose irritation (saline nasal spray) and sore throat (Chloraseptic spray).   SCDs   Patient has an active order for admit to inpatient/place in observation   Cardiac Monitoring Continuous x 24 hours Indications for use: Other; other indications for use: presyncope   Full code   Consult to hospitalist  ALL PATIENTS BEING ADMITTED/HAVING PROCEDURES NEED COVID-19 SCREENING   Inpatient consult to spiritual care   OT eval and treat   PT eval and treat   Pulse oximetry, continuous   Pulse oximetry check with vital signs   Oxygen therapy Mode or (Route): Nasal cannula; Liters Per Minute: 2; Keep 02 saturation: greater than 92 %   Incentive spirometry   CBG monitoring, ED   ED EKG  EKG 12-Lead   EKG 12-Lead   EKG 12-Lead   ECHOCARDIOGRAM COMPLETE   Place in observation (patient's expected length of stay will be less than 2 midnights)    Following Medications were ordered in ER: Medications  sodium chloride flush (NS) 0.9 % injection 3 mL (has no administration in time range)  calcitRIOL (ROCALTROL) capsule 0.25 mcg (has no administration in time range)  levothyroxine (SYNTHROID) tablet 100 mcg (has no administration in time range)  loratadine (CLARITIN) tablet 10 mg (has no administration in time range)  acetaminophen (TYLENOL) tablet 650  mg (has no administration in time range)    Or  acetaminophen (TYLENOL) suppository 650 mg (has no administration in time range)  ondansetron (ZOFRAN) tablet 4 mg (has no administration in time range)    Or  ondansetron (ZOFRAN) injection 4 mg (has no administration in time range)  0.9 %  sodium chloride infusion (has no administration in time range)  cefTRIAXone (ROCEPHIN) 1 g in sodium chloride 0.9 % 100 mL IVPB (has no administration in time range)  cefTRIAXone (ROCEPHIN) 1 g in sodium chloride 0.9 % 100 mL IVPB (0 g Intravenous Stopping Infusion hung by another clincian 10/16/18 0114)        Consult Orders  (From admission, onward)         Start     Ordered   10/16/18 0140  Inpatient consult to spiritual care  Once    Provider:  (Not yet assigned)  Question:  Reason for Consult  Answer:  Create or update advance directive   10/16/18 0139   10/16/18 0115  PT eval and treat  Routine     10/16/18 0114   10/16/18 0115  OT eval and treat  Routine    Question:  Reason for OT?  Answer:  unstable   10/16/18 0114   10/15/18 2159  Consult to hospitalist  ALL PATIENTS BEING ADMITTED/HAVING PROCEDURES NEED COVID-19 SCREENING Pg sent by dee  Once    Comments: ALL PATIENTS BEING ADMITTED/HAVING PROCEDURES NEED COVID-19 SCREENING  Provider:  (Not yet assigned)  Question Answer Comment  Place call to: [facility]   Reason for Consult Admit   Diagnosis/Clinical Info for Consult: near syncope, uti      10/15/18 2159          Significant initial  Findings: Abnormal Labs Reviewed  BASIC METABOLIC PANEL - Abnormal; Notable for the following components:      Result Value   Glucose, Bld 110 (*)    Calcium 7.8 (*)    GFR calc non Af Amer 50 (*)    GFR calc Af Amer 58 (*)    All other components within normal limits  URINALYSIS, ROUTINE W REFLEX MICROSCOPIC - Abnormal; Notable for the following components:   APPearance HAZY (*)    Hgb urine dipstick SMALL (*)    Nitrite POSITIVE  (*)    Leukocytes,Ua LARGE (*)    WBC, UA >50 (*)    Bacteria, UA MANY (*)    Non Squamous Epithelial 0-5 (*)    All other components within normal limits  TROPONIN I (HIGH SENSITIVITY) - Abnormal; Notable for the following components:   Troponin I (High Sensitivity) 82 (*)    All other components within normal limits  TROPONIN I (HIGH SENSITIVITY) - Abnormal; Notable for the following components:   Troponin I (High Sensitivity) 84 (*)    All other components within normal limits  TROPONIN I (HIGH SENSITIVITY) - Abnormal; Notable for the following  components:   Troponin I (High Sensitivity) 81 (*)    All other components within normal limits    Otherwise labs showing:    Recent Labs  Lab 10/15/18 1748  NA 139  K 3.8  CO2 29  GLUCOSE 110*  BUN 15  CREATININE 1.00  CALCIUM 7.8*    Cr  Stable,  Lab Results  Component Value Date   CREATININE 1.00 10/15/2018   CREATININE 1.03 08/19/2018   CREATININE 0.88 08/07/2017    No results for input(s): AST, ALT, ALKPHOS, BILITOT, PROT, ALBUMIN in the last 168 hours. Lab Results  Component Value Date   CALCIUM 7.8 (L) 10/15/2018   PHOS 3.4 11/19/2014        WBC      Component Value Date/Time   WBC 8.1 10/15/2018 1748   ANC    Component Value Date/Time   NEUTROABS 5.8 08/07/2017 0500   ALC No results found for: LYMPHOABS   Plt: Lab Results  Component Value Date   PLT 318 10/15/2018     Lactic Acid, Venous    Component Value Date/Time   LATICACIDVEN 1.1 08/05/2017 1710        COVID-19 Labs  No results for input(s): DDIMER, FERRITIN, LDH, CRP in the last 72 hours.  Lab Results  Component Value Date   [place] NEGATIVE 10/15/2018     HG/HCT * stable,  Down *Up from baseline see below    Component Value Date/Time   HGB 12.6 10/15/2018 1748   HCT 39.9 10/15/2018 1748       Troponin 84 - 81   CBG (last 3)  Recent Labs    10/15/18 2058  GLUCAP 88       UA   evidence of UTI      Urine  analysis:    Component Value Date/Time   COLORURINE YELLOW 10/15/2018 2119   APPEARANCEUR HAZY (A) 10/15/2018 2119   LABSPEC 1.006 10/15/2018 2119   PHURINE 7.0 10/15/2018 2119   GLUCOSEU NEGATIVE 10/15/2018 2119   HGBUR SMALL (A) 10/15/2018 2119   BILIRUBINUR NEGATIVE 10/15/2018 2119   KETONESUR NEGATIVE 10/15/2018 2119   PROTEINUR NEGATIVE 10/15/2018 2119   UROBILINOGEN 1.0 03/13/2011 0011   NITRITE POSITIVE (A) 10/15/2018 2119   LEUKOCYTESUR LARGE (A) 10/15/2018 2119      CT HEAD   NON acute  CXR -  NON acute   ECG:  Personally reviewed by me showing: HR :  68 Rhythm:  NSR,     no evidence of ischemic changes QTC 473       ED Triage Vitals  Enc Vitals Group     BP 10/15/18 1746 (!) 166/81     Pulse Rate 10/15/18 1746 77     Resp 10/15/18 1746 16     Temp 10/15/18 1746 98.2 F (36.8 C)     Temp Source 10/15/18 1746 Oral     SpO2 10/15/18 1746 96 %     Weight --      Height --      Head Circumference --      Peak Flow --      Pain Score 10/15/18 2022 0     Pain Loc --      Pain Edu? --      Excl. in [place]? --   TMAX(24)@       Latest  Blood pressure (!) 166/81, pulse 77, temperature 98.2 F (36.8 C), temperature source Oral, resp. rate 16, SpO2 96 %.  Hospitalist was called for admission for presyncope-like in the setting of UTI   Review of Systems:    Pertinent positives include:  fatigue,lower extremity swelling  headaches, lightheadedness confusion  Constitutional:  No weight loss, night sweats, Fevers, chills,  weight loss  HEENT:  No  Difficulty swallowing,Tooth/dental problems,Sore throat,  No sneezing, itching, ear ache, nasal congestion, post nasal drip,  Cardio-vascular:  No chest pain, Orthopnea, PND, anasarca, dizziness, palpitations.  GI:  No heartburn, indigestion, abdominal pain, nausea, vomiting, diarrhea, change in bowel habits, loss of appetite, melena, blood in stool, hematemesis Resp:  no shortness of breath at rest. No  dyspnea on exertion, No excess mucus, no productive cough, No non-productive cough, No coughing up of blood.No change in color of mucus.No wheezing. Skin:  no rash or lesions. No jaundice GU:  no dysuria, change in color of urine, no urgency or frequency. No straining to urinate.  No flank pain.  Musculoskeletal:  No joint pain or no joint swelling. No decreased range of motion. No back pain.  Psych:  No change in mood or affect. No depression or anxiety. No memory loss.  Neuro: no localizing neurological complaints, no tingling, no weakness, no double vision, no gait abnormality, no slurred speech, no   All systems reviewed and apart from [place] all are negative  Past Medical History:   Past Medical History:  Diagnosis Date   ALLERGIC RHINITIS 11/02/2006   CKD (chronic kidney disease), stage III ([place])    HYPERLIPIDEMIA 11/04/2006   HYPOTHYROIDISM, POSTSURGICAL 03/28/2007   Malignant neoplasm of thyroid gland ([place]) 03/28/2007   5/07: righte lobectomy--path shows 3 cm follicular ca 5/88: completion left lobectomy, no more tumor seen 09/07: thyroglobulin 0.2 9ab neg) 11/07: i-131 rx, 101 [facility] 6/08: thyroglogulin 0.2 (ab neg) , hypothyroid body scan neg (clinically tolerated hypothyroidism poorly) 06/09: tg undetectable (ab=1.5, normal) 12/10: tg undetectable (ab neg) 6/11: tg undectable (ab neg)   OSTEOPENIA 11/02/2006      Past Surgical History:  Procedure Laterality Date   ABDOMINAL HYSTERECTOMY     CERVICAL POLYPECTOMY     OPEN REDUCTION INTERNAL FIXATION (ORIF) DISTAL RADIAL FRACTURE Left 01/14/2014   Procedure: OPEN REDUCTION INTERNAL FIXATION DISTAL RADIAL FRACTURE;  Surgeon: Linna Hoff, MD;  Location: [facility];  Service: Orthopedics;  Laterality: Left;    Social History:  Ambulatory    Cane,      reports that she has never smoked. She has never used smokeless tobacco. No history on file for alcohol and drug.     Family History:   Family History  Problem  Relation Age of Onset   Diabetes Sister     Allergies: No Known Allergies   Prior to Admission medications   Medication Sig Start Date End Date Taking? Authorizing Provider  calcitRIOL (ROCALTROL) 0.25 MCG capsule Take 1 capsule (0.25 mcg total) by mouth 4 (four) times a week. 11/22/17  Yes Renato Shin, MD  calcium carbonate (OS-CAL) 600 MG TABS Take 600 mg by mouth at bedtime.    Yes [provider]  cetirizine (ZYRTEC) 10 MG tablet Take 10 mg by mouth daily as needed for allergies or rhinitis.    Yes [provider]  docusate sodium (COLACE) 100 MG capsule Take 100 mg by mouth at bedtime.    Yes [provider]  levothyroxine (SYNTHROID) 100 MCG tablet Take 1 tablet (100 mcg total) by mouth daily before breakfast. 08/19/18  Yes Renato Shin, MD  Menthol-Ascorbic Acid (LUDENS COUGH DROPS MT) Use as  directed 1 drop in the mouth or throat as needed (dry throat).   Yes [provider]  Multiple Vitamins-Minerals (MULTIVITAMINS THER. W/MINERALS) TABS Take 1 tablet by mouth daily.     Yes [provider]  polyethylene glycol powder (GLYCOLAX/MIRALAX) 17 GM/SCOOP powder Take 4.25-8.5 g by mouth at bedtime.   Yes [provider]  valACYclovir (VALTREX) 500 MG tablet Take 1 pill daily for 5 days for outbreaks. Patient taking differently: Take 500 mg by mouth See admin instructions. Take 500 mg (1 tablet) by mouth once a day for 5 days for Shingles-related outbreaks 05/07/18  Yes Vivi Barrack, MD  vitamin C (ASCORBIC ACID) 500 MG tablet Take 1 tablet (500 mg total) by mouth daily. 01/14/14  Yes Iran Planas, MD  triamcinolone cream (KENALOG) 0.1 % Apply 1 application topically 2 (two) times daily. Patient not taking: Reported on 10/15/2018 05/07/18   Vivi Barrack, MD   Physical Exam: Blood pressure (!) 166/81, pulse 77, temperature 98.2 F (36.8 C), temperature source Oral, resp. rate 16, SpO2 96 %. 1. General:  in No  Acute distress    well  -appearing 2. Psychological: Alert and   Oriented 3. Head/ENT:    Dry Mucous Membranes                          Head Non traumatic, neck supple                            Poor Dentition 4. SKIN:  Normal  Skin turgor,  Skin clean Dry and intact no rash 5. Heart: Regular rate and rhythm no Murmur, no Rub or gallop 6. Lungs:  Clear to auscultation bilaterally, no wheezes or crackles   7. Abdomen: Soft,  non-tender, Non distended bowel sounds present 8. Lower extremities: no clubbing, cyanosis, no edema 9. Neurologically  strength 5 out of 5 in all 4 extremities cranial nerves II through XII intact 10. MSK: Normal range of motion   All other LABS:     Recent Labs  Lab 10/15/18 1748  WBC 8.1  HGB 12.6  HCT 39.9  MCV 89.5  PLT 318     Recent Labs  Lab 10/15/18 1748  NA 139  K 3.8  CL 100  CO2 29  GLUCOSE 110*  BUN 15  CREATININE 1.00  CALCIUM 7.8*     No results for input(s): AST, ALT, ALKPHOS, BILITOT, PROT, ALBUMIN in the last 168 hours.     Cultures:    Component Value Date/Time   SDES BLOOD RIGHT ANTECUBITAL 08/05/2017 0816   SPECREQUEST  08/05/2017 0816    IN BOTH AEROBIC AND ANAEROBIC BOTTLES Blood Culture adequate volume   CULT  08/05/2017 0816    NO GROWTH 5 DAYS Performed at [facility], [place] [address], [place], [place] [address]    REPTSTATUS 08/10/2017 FINAL 08/05/2017 0816     Radiological Exams on Admission: Ct Head Wo Contrast  Result Date: 10/15/2018 CLINICAL DATA:  Headaches and near syncopal episode EXAM: CT HEAD WITHOUT CONTRAST TECHNIQUE: Contiguous axial images were obtained from the base of the skull through the vertex without intravenous contrast. COMPARISON:  None. FINDINGS: Brain: Scattered areas of decreased attenuation are noted in the deep white matter consistent with chronic white matter ischemic change. No findings to suggest acute hemorrhage, acute infarction or space-occupying mass lesion are seen. Vascular: No hyperdense  vessel or unexpected  calcification. Skull: Normal. Negative for fracture or focal lesion. Sinuses/Orbits: No acute finding. Other: None. IMPRESSION: Chronic white matter ischemic change without acute abnormality. Electronically Signed   By: Inez Catalina M.D.   On: 10/15/2018 20:29   Dg Chest Port 1 View  Result Date: 10/15/2018 CLINICAL DATA:  Near syncope with headache and weakness. EXAM: PORTABLE CHEST 1 VIEW COMPARISON:  01/14/2014 FINDINGS: Cardiac silhouette is mildly enlarged. No mediastinal or hilar masses. Lungs are hyperexpanded. There are prominent bronchovascular markings most evident in the lower lungs. No evidence of pneumonia or pulmonary edema. No pleural effusion or pneumothorax. Postsurgical changes at the neck base consistent with prior thyroid surgery. Skeletal structures are demineralized but grossly intact. IMPRESSION: 1. No acute cardiopulmonary disease. 2. Stable cardiomegaly and chronic lung changes. Electronically Signed   By: Lajean Manes M.D.   On: 10/15/2018 20:56    Chart has been reviewed    Assessment/Plan  85 y.o. female with medical history significant of thyroid cancer, hypoparathyroidism, CKD  Admitted for presyncope, UTI and reported right leg weakness  Present on Admission:  Pre Syncope -  given risk factor will admit rehydrate obtain CE, monitor on tele and obtain carotid dopplers   Acute lower UTI -  - treat with Rocephin         await results of urine culture and adjust antibiotic coverage as needed   Right leg weakness/swelling  -given syncope-like etiology headache and some confusion will obtain MRI head currently appears to be neurologically intact she is quite unsure for how long she have had right leg weakness and if it was a true weakness of it just was swelling will obtain a d.dimer and if significantly elevated will pursue Doppler studies and CTA chest    Hypothyroidism - - Check TSH continue home medications at current dose   Elevated  troponin -  -no chest pain no EKG changes monitor on telemetry and cycle cardiac enzymes to trend.  if continues to rise will need further work-up  CKD - chronic, avoid nephrotoxic medications  HTN - elevated BP in ER if MRI show no evidence of CVA would benefit from initiation of antihypertensives     Other plan as per orders.  DVT prophylaxis:  SCD   Code Status:  FULL CODE   as per patient   I had personally discussed CODE STATUS with patient   Family Communication:   Family not at  Bedside    Disposition Plan:       To home once workup is complete and patient is stable                    Would benefit from PT/OT eval prior to DC  Ordered                     Consults called:   none  Admission status:  ED Disposition    ED Disposition Condition [place]: [place] [[id number]]  Level of Care: Telemetry Cardiac [103]  I expect the patient will be discharged within 24 hours: Yes  LOW acuity---Tx typically complete <24 hrs---ACUTE conditions typically can be evaluated <24 hours---LABS likely to return to acceptable levels <24 hours---IS near functional baseline---EXPECTED to return to current living arrangement---NOT newly hypoxic: Meets criteria for 5C-Observation unit  Covid Evaluation: Asymptomatic Screening Protocol (No Symptoms)  Diagnosis: Syncope [875643]  Admitting Physician: Toy Baker [[id number]]  Attending Physician: Toy Baker [[id number]]  PT  Class (Do Not Modify): Observation [104]  PT Acc Code (Do Not Modify): Observation [10022]        Obs    Level of care   tele  For 12H   Precautions: NONE   No active isolations  PPE: Used by the provider:   P100  eye Goggles,  Gloves       Sherrill Mckamie 10/16/2018, 1:53 AM    [facility]     after 2 AM please page floor coverage PA If 7AM-7PM, please contact the day team taking care of the patient using [email or web address]

## 2018-10-15 NOTE — Telephone Encounter (Signed)
Forwarding to Dr. Jerline Pain as Juluis Rainier.

## 2018-10-15 NOTE — ED Notes (Signed)
CBG results of 88 reported to [place], Therapist, sports.

## 2018-10-15 NOTE — ED Provider Notes (Addendum)
[facility] EMERGENCY DEPARTMENT Provider Note   CSN: [id number] Arrival date & time: 10/15/18  1726     History   Chief Complaint Chief Complaint  Patient presents with  . Near Syncope  . Headache    HPI [name] [name] is a 85 y.o. female.     85 year old female with prior medical history as detailed below presents for evaluation of reported near syncopal symptoms and generalized weakness.  Patient reports that over the last 24 hours she has had several episodes where she feels very weak and feels like she might pass out.  She describes feeling like her vision is going dark.  It is somewhat unclear from discussing patient symptoms as to whether she had full syncope or not.  She denies associated chest pain or shortness of breath.  She denies palpitations.  She denies recent fever or abdominal pain.  She reports that she is feeling improved now.  She does report mild intermittent headache.  She is not currently having headache.  The history is provided by the patient and medical records.  Near Syncope This is a new problem. The current episode started 12 to 24 hours ago. The problem occurs rarely. The problem has been gradually improving. Associated symptoms include headaches. Nothing aggravates the symptoms. Nothing relieves the symptoms.  Headache Associated symptoms: near-syncope     Past Medical History:  Diagnosis Date  . ALLERGIC RHINITIS 11/02/2006  . CKD (chronic kidney disease), stage III ([place])   . HYPERLIPIDEMIA 11/04/2006  . HYPOTHYROIDISM, POSTSURGICAL 03/28/2007  . Malignant neoplasm of thyroid gland ([place]) 03/28/2007   5/07: righte lobectomy--path shows 3 cm follicular ca 1/82: completion left lobectomy, no more tumor seen 09/07: thyroglobulin 0.2 9ab neg) 11/07: i-131 rx, 101 [facility] 6/08: thyroglogulin 0.2 (ab neg) , hypothyroid body scan neg (clinically tolerated hypothyroidism poorly) 06/09: tg undetectable (ab=1.5, normal) 12/10: tg undetectable (ab  neg) 6/11: tg undectable (ab neg)  . OSTEOPENIA 11/02/2006    Patient Active Problem List   Diagnosis Date Noted  . Corns and callus 09/25/2018  . Shingles 04/05/2018  . HLD (hyperlipidemia) 08/05/2017  . CKD (chronic kidney disease), stage III ([place]) 08/05/2017  . Numbness 11/19/2014  . Malignant neoplasm of thyroid gland ([place]) 03/28/2007  . HYPOTHYROIDISM, POSTSURGICAL 03/28/2007  . HYPOPARATHYROIDISM 03/28/2007  . ALLERGIC RHINITIS 11/02/2006  . OSTEOPENIA 11/02/2006    Past Surgical History:  Procedure Laterality Date  . ABDOMINAL HYSTERECTOMY    . CERVICAL POLYPECTOMY    . OPEN REDUCTION INTERNAL FIXATION (ORIF) DISTAL RADIAL FRACTURE Left 01/14/2014   Procedure: OPEN REDUCTION INTERNAL FIXATION DISTAL RADIAL FRACTURE;  Surgeon: Linna Hoff, MD;  Location: [facility];  Service: Orthopedics;  Laterality: Left;     OB History   No obstetric history on file.      Home Medications    Prior to Admission medications   Medication Sig Start Date End Date Taking? Authorizing Provider  calcitRIOL (ROCALTROL) 0.25 MCG capsule Take 1 capsule (0.25 mcg total) by mouth 4 (four) times a week. 11/22/17   Renato Shin, MD  calcium carbonate (OS-CAL) 600 MG TABS Take 600 mg by mouth daily.     [provider]  cetirizine (ZYRTEC) 10 MG tablet Take 10 mg by mouth daily.    [provider]  docusate sodium (COLACE) 100 MG capsule Take 100 mg by mouth daily.    [provider]  levothyroxine (SYNTHROID) 100 MCG tablet Take 1 tablet (100 mcg total) by mouth daily before  breakfast. 08/19/18   Renato Shin, MD  Menthol-Ascorbic Acid (LUDENS COUGH DROPS MT) Use as directed 1 drop in the mouth or throat as needed (dry throat).    [provider]  Multiple Vitamins-Minerals (MULTIVITAMINS THER. W/MINERALS) TABS Take 1 tablet by mouth daily.      [provider]  triamcinolone cream (KENALOG) 0.1 % Apply 1 application topically 2 (two) times daily. 05/07/18    Vivi Barrack, MD  valACYclovir (VALTREX) 500 MG tablet Take 1 pill daily for 5 days for outbreaks. 05/07/18   Vivi Barrack, MD  vitamin C (ASCORBIC ACID) 500 MG tablet Take 1 tablet (500 mg total) by mouth daily. 01/14/14   Iran Planas, MD    Family History Family History  Problem Relation Age of Onset  . Diabetes Sister     Social History Social History   Tobacco Use  . Smoking status: Never Smoker  . Smokeless tobacco: Never Used  Substance Use Topics  . Alcohol use: Not on file  . Drug use: Not on file     Allergies   Patient has no known allergies.   Review of Systems Review of Systems  Cardiovascular: Positive for near-syncope.  Neurological: Positive for headaches.  All other systems reviewed and are negative.    Physical Exam Updated Vital Signs BP (!) 166/81 (BP Location: Right Arm)   Pulse 77   Temp 98.2 F (36.8 C) (Oral)   Resp 16   SpO2 96%   Physical Exam Vitals signs and nursing note reviewed.  Constitutional:      General: She is not in acute distress.    Appearance: She is well-developed.  HENT:     Head: Normocephalic and atraumatic.  Eyes:     General: No visual field deficit.    Conjunctiva/sclera: Conjunctivae normal.     Pupils: Pupils are equal, round, and reactive to light.  Neck:     Musculoskeletal: Normal range of motion and neck supple.  Cardiovascular:     Rate and Rhythm: Normal rate and regular rhythm.     Heart sounds: Normal heart sounds.  Pulmonary:     Effort: Pulmonary effort is normal. No respiratory distress.     Breath sounds: Normal breath sounds.  Abdominal:     General: There is no distension.     Palpations: Abdomen is soft.     Tenderness: There is no abdominal tenderness.  Musculoskeletal: Normal range of motion.        General: No deformity.  Skin:    General: Skin is warm and dry.  Neurological:     Mental Status: She is alert and oriented to person, place, and time. Mental status is at  baseline.     GCS: GCS eye subscore is 4. GCS verbal subscore is 5. GCS motor subscore is 6.     Cranial Nerves: No cranial nerve deficit, dysarthria or facial asymmetry.     Sensory: No sensory deficit.     Motor: No weakness.      ED Treatments / Results  Labs (all labs ordered are listed, but only abnormal results are displayed) Labs Reviewed  BASIC METABOLIC PANEL - Abnormal; Notable for the following components:      Result Value   Glucose, Bld 110 (*)    Calcium 7.8 (*)    GFR calc non Af Amer 50 (*)    GFR calc Af Amer 58 (*)    All other components within normal limits  URINALYSIS, ROUTINE W  REFLEX MICROSCOPIC - Abnormal; Notable for the following components:   APPearance HAZY (*)    Hgb urine dipstick SMALL (*)    Nitrite POSITIVE (*)    Leukocytes,Ua LARGE (*)    WBC, UA >50 (*)    Bacteria, UA MANY (*)    Non Squamous Epithelial 0-5 (*)    All other components within normal limits  TROPONIN I (HIGH SENSITIVITY) - Abnormal; Notable for the following components:   Troponin I (High Sensitivity) 82 (*)    All other components within normal limits  SARS CORONAVIRUS 2 (HOSPITAL ORDER, [facility])  CBC  TROPONIN I (HIGH SENSITIVITY)  CBG MONITORING, ED    EKG EKG Interpretation  Date/Time:  Tuesday October 15 2018 21:04:10 EDT Ventricular Rate:  68 PR Interval:    QRS Duration: 99 QT Interval:  444 QTC Calculation: 473 R Axis:   -17 Text Interpretation:  Sinus rhythm LVH with secondary repolarization abnormality Anterior infarct, old Confirmed by Dene Gentry [phone]) on 10/15/2018 9:09:21 PM   Radiology Ct Head Wo Contrast  Result Date: 10/15/2018 CLINICAL DATA:  Headaches and near syncopal episode EXAM: CT HEAD WITHOUT CONTRAST TECHNIQUE: Contiguous axial images were obtained from the base of the skull through the vertex without intravenous contrast. COMPARISON:  None. FINDINGS: Brain: Scattered areas of decreased attenuation are noted  in the deep white matter consistent with chronic white matter ischemic change. No findings to suggest acute hemorrhage, acute infarction or space-occupying mass lesion are seen. Vascular: No hyperdense vessel or unexpected calcification. Skull: Normal. Negative for fracture or focal lesion. Sinuses/Orbits: No acute finding. Other: None. IMPRESSION: Chronic white matter ischemic change without acute abnormality. Electronically Signed   By: Inez Catalina M.D.   On: 10/15/2018 20:29   Dg Chest Port 1 View  Result Date: 10/15/2018 CLINICAL DATA:  Near syncope with headache and weakness. EXAM: PORTABLE CHEST 1 VIEW COMPARISON:  01/14/2014 FINDINGS: Cardiac silhouette is mildly enlarged. No mediastinal or hilar masses. Lungs are hyperexpanded. There are prominent bronchovascular markings most evident in the lower lungs. No evidence of pneumonia or pulmonary edema. No pleural effusion or pneumothorax. Postsurgical changes at the neck base consistent with prior thyroid surgery. Skeletal structures are demineralized but grossly intact. IMPRESSION: 1. No acute cardiopulmonary disease. 2. Stable cardiomegaly and chronic lung changes. Electronically Signed   By: Lajean Manes M.D.   On: 10/15/2018 20:56    Procedures Procedures (including critical care time)  Medications Ordered in ED Medications  sodium chloride flush (NS) 0.9 % injection 3 mL (has no administration in time range)  cefTRIAXone (ROCEPHIN) 1 g in sodium chloride 0.9 % 100 mL IVPB (has no administration in time range)     Initial Impression / Assessment and Plan / ED Course  I have reviewed the triage vital signs and the nursing notes.  Pertinent labs & imaging results that were available during my care of the patient were reviewed by me and considered in my medical decision making (see chart for details).        MDM  Screen complete  [name] [name] was evaluated in Emergency Department on 10/15/2018 for the symptoms described in the  history of present illness. She was evaluated in the context of the global COVID-19 pandemic, which necessitated consideration that the patient might be at risk for infection with the SARS-CoV-2 virus that causes COVID-19. Institutional protocols and algorithms that pertain to the evaluation of patients at risk for COVID-19 are in a state  of rapid change based on information released by regulatory bodies including the CDC and federal and state organizations. These policies and algorithms were followed during the patient's care in the ED.   Patient is presenting for evaluation of generalized weakness and associated near syncopal symptoms.  She is neurologically intact upon my evaluation.  Work-up does demonstrate likely UTI.  This may be at least partially responsible for the patient's reported symptoms.  She would likely benefit from further work-up and treatment as an inpatient.  Internal medicine is aware of case and will evaluate for admission.  Final Clinical Impressions(s) / ED Diagnoses   Final diagnoses:  Near syncope  Urinary tract infection without hematuria, site unspecified    ED Discharge Orders    None       Valarie Merino, MD 10/15/18 2203    Valarie Merino, MD 10/15/18 2246

## 2018-10-16 ENCOUNTER — Observation Stay (HOSPITAL_BASED_OUTPATIENT_CLINIC_OR_DEPARTMENT_OTHER): Payer: PPO

## 2018-10-16 ENCOUNTER — Observation Stay (HOSPITAL_COMMUNITY): Payer: PPO

## 2018-10-16 ENCOUNTER — Inpatient Hospital Stay (HOSPITAL_COMMUNITY): Payer: PPO

## 2018-10-16 DIAGNOSIS — Z20828 Contact with and (suspected) exposure to other viral communicable diseases: Secondary | ICD-10-CM | POA: Diagnosis present

## 2018-10-16 DIAGNOSIS — N390 Urinary tract infection, site not specified: Secondary | ICD-10-CM | POA: Diagnosis present

## 2018-10-16 DIAGNOSIS — I471 Supraventricular tachycardia: Secondary | ICD-10-CM | POA: Diagnosis present

## 2018-10-16 DIAGNOSIS — E890 Postprocedural hypothyroidism: Secondary | ICD-10-CM | POA: Diagnosis present

## 2018-10-16 DIAGNOSIS — Z833 Family history of diabetes mellitus: Secondary | ICD-10-CM | POA: Diagnosis not present

## 2018-10-16 DIAGNOSIS — R51 Headache: Secondary | ICD-10-CM | POA: Diagnosis not present

## 2018-10-16 DIAGNOSIS — R410 Disorientation, unspecified: Secondary | ICD-10-CM | POA: Diagnosis not present

## 2018-10-16 DIAGNOSIS — I700 Atherosclerosis of aorta: Secondary | ICD-10-CM | POA: Diagnosis present

## 2018-10-16 DIAGNOSIS — Z79890 Hormone replacement therapy (postmenopausal): Secondary | ICD-10-CM | POA: Diagnosis not present

## 2018-10-16 DIAGNOSIS — I472 Ventricular tachycardia: Secondary | ICD-10-CM | POA: Diagnosis not present

## 2018-10-16 DIAGNOSIS — R7989 Other specified abnormal findings of blood chemistry: Secondary | ICD-10-CM | POA: Diagnosis not present

## 2018-10-16 DIAGNOSIS — Z85850 Personal history of malignant neoplasm of thyroid: Secondary | ICD-10-CM | POA: Diagnosis not present

## 2018-10-16 DIAGNOSIS — B029 Zoster without complications: Secondary | ICD-10-CM | POA: Diagnosis present

## 2018-10-16 DIAGNOSIS — N183 Chronic kidney disease, stage 3 (moderate): Secondary | ICD-10-CM | POA: Diagnosis present

## 2018-10-16 DIAGNOSIS — R55 Syncope and collapse: Secondary | ICD-10-CM | POA: Diagnosis present

## 2018-10-16 DIAGNOSIS — M7989 Other specified soft tissue disorders: Secondary | ICD-10-CM | POA: Diagnosis not present

## 2018-10-16 DIAGNOSIS — M8580 Other specified disorders of bone density and structure, unspecified site: Secondary | ICD-10-CM | POA: Diagnosis present

## 2018-10-16 DIAGNOSIS — J309 Allergic rhinitis, unspecified: Secondary | ICD-10-CM | POA: Diagnosis present

## 2018-10-16 DIAGNOSIS — G9341 Metabolic encephalopathy: Secondary | ICD-10-CM | POA: Diagnosis not present

## 2018-10-16 DIAGNOSIS — I129 Hypertensive chronic kidney disease with stage 1 through stage 4 chronic kidney disease, or unspecified chronic kidney disease: Secondary | ICD-10-CM | POA: Diagnosis present

## 2018-10-16 DIAGNOSIS — R29898 Other symptoms and signs involving the musculoskeletal system: Secondary | ICD-10-CM | POA: Diagnosis not present

## 2018-10-16 DIAGNOSIS — E785 Hyperlipidemia, unspecified: Secondary | ICD-10-CM | POA: Diagnosis present

## 2018-10-16 DIAGNOSIS — I498 Other specified cardiac arrhythmias: Secondary | ICD-10-CM | POA: Diagnosis not present

## 2018-10-16 LAB — COMPREHENSIVE METABOLIC PANEL
ALT: 18 U/L (ref 0–44)
AST: 27 U/L (ref 15–41)
Albumin: 3.9 g/dL (ref 3.5–5.0)
Alkaline Phosphatase: 55 U/L (ref 38–126)
Anion gap: 9 (ref 5–15)
BUN: 14 mg/dL (ref 8–23)
CO2: 29 mmol/L (ref 22–32)
Calcium: 7.5 mg/dL — ABNORMAL LOW (ref 8.9–10.3)
Chloride: 98 mmol/L (ref 98–111)
Creatinine, Ser: 1.02 mg/dL — ABNORMAL HIGH (ref 0.44–1.00)
GFR calc Af Amer: 57 mL/min — ABNORMAL LOW (ref >60)
GFR calc non Af Amer: 49 mL/min — ABNORMAL LOW (ref >60)
Glucose, Bld: 185 mg/dL — ABNORMAL HIGH (ref 70–99)
Potassium: 3.8 mmol/L (ref 3.5–5.1)
Sodium: 136 mmol/L (ref 135–145)
Total Bilirubin: 0.6 mg/dL (ref 0.3–1.2)
Total Protein: 8.1 g/dL (ref 6.5–8.1)

## 2018-10-16 LAB — ECHOCARDIOGRAM COMPLETE
Height: 64 in
Weight: 2256 oz

## 2018-10-16 LAB — CBC
HCT: 42.2 % (ref 36.0–46.0)
Hemoglobin: 13.3 g/dL (ref 12.0–15.0)
MCH: 27.9 pg (ref 26.0–34.0)
MCHC: 31.5 g/dL (ref 30.0–36.0)
MCV: 88.7 fL (ref 80.0–100.0)
Platelets: 328 10*3/uL (ref 150–400)
RBC: 4.76 MIL/uL (ref 3.87–5.11)
RDW: 13.3 % (ref 11.5–15.5)
WBC: 10.1 10*3/uL (ref 4.0–10.5)
nRBC: 0 % (ref 0.0–0.2)

## 2018-10-16 LAB — TROPONIN I (HIGH SENSITIVITY)
Troponin I (High Sensitivity): 81 ng/L — ABNORMAL HIGH (ref <18)
Troponin I (High Sensitivity): 67 ng/L — ABNORMAL HIGH (ref <18)

## 2018-10-16 LAB — D-DIMER, QUANTITATIVE: D-Dimer, Quant: 1.65 ug/mL-FEU — ABNORMAL HIGH (ref 0.00–0.50)

## 2018-10-16 LAB — PHOSPHORUS: Phosphorus: 3.7 mg/dL (ref 2.5–4.6)

## 2018-10-16 LAB — TSH: TSH: 3.732 u[IU]/mL (ref 0.350–4.500)

## 2018-10-16 LAB — SARS CORONAVIRUS 2 BY RT PCR (HOSPITAL ORDER, PERFORMED IN ~~LOC~~ HOSPITAL LAB): SARS Coronavirus 2: NEGATIVE

## 2018-10-16 LAB — MAGNESIUM: Magnesium: 2 mg/dL (ref 1.7–2.4)

## 2018-10-16 MED ORDER — SODIUM CHLORIDE 0.9 % IV SOLN
1.0000 g | INTRAVENOUS | Status: DC
  Administered 2018-10-16 – 2018-10-17 (×2): 1 g via INTRAVENOUS
  Filled 2018-10-16 (×2): qty 10

## 2018-10-16 MED ORDER — VALACYCLOVIR HCL 500 MG PO TABS: 1000.0000 mg | ORAL_TABLET | Freq: Two times a day (BID) | ORAL | Status: DC

## 2018-10-16 MED ORDER — LEVOTHYROXINE SODIUM 100 MCG PO TABS
100.0000 ug | ORAL_TABLET | Freq: Every day | ORAL | Status: DC
  Administered 2018-10-16 – 2018-10-18 (×3): 100 ug via ORAL
  Filled 2018-10-16 (×3): qty 1

## 2018-10-16 MED ORDER — ONDANSETRON HCL 4 MG PO TABS: 4.0000 mg | ORAL_TABLET | Freq: Four times a day (QID) | ORAL | Status: DC | PRN

## 2018-10-16 MED ORDER — SODIUM CHLORIDE 0.9 % IV SOLN
INTRAVENOUS | Status: AC
  Administered 2018-10-16: 02:00:00 via INTRAVENOUS

## 2018-10-16 MED ORDER — ACETAMINOPHEN 650 MG RE SUPP: 650.0000 mg | Freq: Four times a day (QID) | RECTAL | Status: DC | PRN

## 2018-10-16 MED ORDER — LORATADINE 10 MG PO TABS
10.0000 mg | ORAL_TABLET | Freq: Every day | ORAL | Status: DC
  Administered 2018-10-16 – 2018-10-18 (×3): 10 mg via ORAL
  Filled 2018-10-16 (×3): qty 1

## 2018-10-16 MED ORDER — CALCITRIOL 0.25 MCG PO CAPS
0.2500 ug | ORAL_CAPSULE | ORAL | Status: DC
  Administered 2018-10-17: 08:00:00 0.25 ug via ORAL
  Filled 2018-10-16: qty 1

## 2018-10-16 MED ORDER — VALACYCLOVIR HCL 500 MG PO TABS: 500.0000 mg | ORAL_TABLET | ORAL | Status: DC

## 2018-10-16 MED ORDER — ONDANSETRON HCL 4 MG/2ML IJ SOLN: 4.0000 mg | Freq: Four times a day (QID) | INTRAMUSCULAR | Status: DC | PRN

## 2018-10-16 MED ORDER — ACETAMINOPHEN 325 MG PO TABS: 650.0000 mg | ORAL_TABLET | Freq: Four times a day (QID) | ORAL | Status: DC | PRN

## 2018-10-16 MED ORDER — IOHEXOL 350 MG/ML SOLN
80.0000 mL | Freq: Once | INTRAVENOUS | Status: AC | PRN
  Administered 2018-10-16: 80 mL via INTRAVENOUS

## 2018-10-16 MED ORDER — VALACYCLOVIR HCL 500 MG PO TABS
1000.0000 mg | ORAL_TABLET | Freq: Two times a day (BID) | ORAL | Status: DC
  Administered 2018-10-16 – 2018-10-18 (×3): 1000 mg via ORAL
  Filled 2018-10-16 (×4): qty 2

## 2018-10-16 NOTE — Progress Notes (Signed)
Echocardiogram 2D Echocardiogram has been performed.  [name] [name] 10/16/2018, 9:49 AM

## 2018-10-16 NOTE — Progress Notes (Signed)
Attempted carotid duplex again, however patient is in chair eating lunch. Will attempt again as schedule permits.  10/16/2018 1:55 PM Maudry Mayhew, MHA, RVT, RDCS, RDMS

## 2018-10-16 NOTE — Plan of Care (Signed)
  Problem: Education: Goal: Knowledge of General Education information will improve Description: Including pain rating scale, medication(s)/side effects and non-pharmacologic comfort measures Outcome: Progressing   Problem: Clinical Measurements: Goal: Will remain free from infection Outcome: Progressing   

## 2018-10-16 NOTE — Telephone Encounter (Signed)
Agree with ED dispo. Pt currently admitted.  Algis Greenhouse. Jerline Pain, MD 10/16/2018 7:54 AM

## 2018-10-16 NOTE — Progress Notes (Signed)
Chaplain responded to spiritual consult. Patient may be interested in [place].  Patient was aware of living wills, "I think my sister completed one."  Patient said she might want to talk to her daughter about doing it. Chaplain left document with her for family discussion. Will continue to be available if needed. Rev. Tamsen Snider Pager [phone]

## 2018-10-16 NOTE — Evaluation (Addendum)
Physical Therapy Evaluation Patient Details Name: [name] [name] MRN: [id number] DOB: 1927/07/07 Today's Date: 10/16/2018   History of Present Illness  85 yo female admitted to ED on 7/7 with headaches, near syncope, and generalized weakness. Pt with UTI, RLE weakness and swelling. Pt also with elevated troponins, now downtrending with echo results WNL. PMH includes CKD, HLD, thyroid cancer s/p R lobectomy, osteopenia, ORIF L radius 2015.  Clinical Impression   Pt presents with LE generalized weakness R>L, increased time and effort to perform mobility tasks, unsteadiness in standing, and decreased activity tolerance. Pt with no reports of dizziness or near syncope during session, pt asked several times throughout mobility. Pt to benefit from acute PT to address deficits. Pt ambulated hallway distance with min guard to min assist, assist required with lateral LOB x3 to recover stability. Pt refuses idea of going to rehab to improve deficits, agreeable to HHPT. PT strongly encouraged pt to have her daughter(s) stay with her and supervise mobility at least upon initial return home, pt states she plans to go to her sister's house after d/c until Friday, and then will go home and will request increased assist from her daughters and son initially. PT to progress mobility as tolerated, and will continue to follow acutely.      Follow Up Recommendations Home health PT;Supervision for mobility/OOB    Equipment Recommendations  None recommended by PT    Recommendations for Other Services       Precautions / Restrictions Precautions Precautions: Fall Restrictions Weight Bearing Restrictions: No      Mobility  Bed Mobility Overal bed mobility: Needs Assistance Bed Mobility: Supine to Sit     Supine to sit: Supervision     General bed mobility comments: pt up in chair upon PT arrival to room, chooses to stay in chair post-PT  Transfers Overall transfer level: Needs assistance Equipment  used: None Transfers: Sit to/from Stand Sit to Stand: Min guard         General transfer comment: Min guard for safety, increased time to rise with reaching for environment to steady self.  Ambulation/Gait Ambulation/Gait assistance: Min guard;Min assist Gait Distance (Feet): 160 Feet Assistive device: None Gait Pattern/deviations: Step-through pattern;Decreased stride length;Trunk flexed;Narrow base of support Gait velocity: decr   General Gait Details: Min guard for safety, occasional min assist for steadying as pt with lateral LOB x3 during ambulation. Pt able to mostly recover balance. Pt states "it just takes me a while to get going" when PT comments on unsteadiness. Verbal cuing for upright posture, looking forward down hallway as opposed to at her feet. Pt with no reports of dizziness, weak-headed feelings, or near-syncope when asked multiple times.  Stairs            Wheelchair Mobility    Modified Rankin (Stroke Patients Only)       Balance Overall balance assessment: Needs assistance;History of Falls(pt reports fall in home when trying to turn the fan/light combo on) Sitting-balance support: No upper extremity supported;Feet supported Sitting balance-Leahy Scale: Good     Standing balance support: During functional activity Standing balance-Leahy Scale: Fair Standing balance comment: able to ambulate without UE support, LOB requiring min assist from PT to recover x3                             Pertinent Vitals/Pain Pain Assessment: No/denies pain    Home Living Family/patient expects to be discharged to:: Private residence  Living Arrangements: Alone Available Help at Discharge: Family;Available PRN/intermittently Type of Home: Mobile home Home Access: Stairs to enter Entrance Stairs-Rails: Right;Left Entrance Stairs-Number of Steps: 5 Home Layout: One level Home Equipment: Cane - single point;Cane - quad      Prior Function Level of  Independence: Independent with assistive device(s)         Comments: pt does not drive, children drive her places.     Hand Dominance   Dominant Hand: Right    Extremity/Trunk Assessment   Upper Extremity Assessment Upper Extremity Assessment: Generalized weakness(per OT)    Lower Extremity Assessment Lower Extremity Assessment: Generalized weakness;RLE deficits/detail RLE Deficits / Details: at least 3/5 strength in hip flexion, hip abd/add, knee flexion/extension - but increased time and effort to move RLE. Pt with slight redness noted RLE, pt reports 2-week history of RLE swelling and discomfort. RLE Sensation: WNL    Cervical / Trunk Assessment Cervical / Trunk Assessment: Kyphotic  Communication   Communication: HOH  Cognition Arousal/Alertness: Awake/alert Behavior During Therapy: WFL for tasks assessed/performed Overall Cognitive Status: Within Functional Limits for tasks assessed                                        General Comments      Exercises     Assessment/Plan    PT Assessment Patient needs continued PT services  PT Problem List Decreased strength;Decreased mobility;Decreased safety awareness;Decreased activity tolerance;Decreased balance       PT Treatment Interventions DME instruction;Therapeutic activities;Gait training;Therapeutic exercise;Patient/family education;Balance training;Stair training;Functional mobility training    PT Goals (Current goals can be found in the Care Plan section)  Acute Rehab PT Goals Patient Stated Goal: go home PT Goal Formulation: With patient Time For Goal Achievement: 10/30/18 Potential to Achieve Goals: Good    Frequency Min 3X/week   Barriers to discharge        Co-evaluation               AM-PAC PT "6 Clicks" Mobility  Outcome Measure Help needed turning from your back to your side while in a flat bed without using bedrails?: A Little Help needed moving from lying on your  back to sitting on the side of a flat bed without using bedrails?: A Little Help needed moving to and from a bed to a chair (including a wheelchair)?: A Little Help needed standing up from a chair using your arms (e.g., wheelchair or bedside chair)?: A Little Help needed to walk in hospital room?: A Little Help needed climbing 3-5 steps with a railing? : A Little 6 Click Score: 18    End of Session Equipment Utilized During Treatment: Gait belt Activity Tolerance: Patient tolerated treatment well Patient left: in chair;with chair alarm set;with call bell/phone within reach Nurse Communication: Mobility status PT Visit Diagnosis: Other abnormalities of gait and mobility (R26.89);Difficulty in walking, not elsewhere classified (R26.2)    Time: 1203-1223 PT Time Calculation (min) (ACUTE ONLY): 20 min   Charges:   PT Evaluation $PT Eval Low Complexity: 1 Low          Julien Girt, PT Acute Rehabilitation Services Pager [phone]  Office [phone]   Enriqueta Augusta D Elonda Husky 10/16/2018, 1:52 PM

## 2018-10-16 NOTE — Progress Notes (Signed)
Attempted carotid artery duplex, however patient is eating. Will attempt again later as schedule permits.  10/16/2018 10:57 AM Maudry Mayhew, MHA, RVT, RDCS, RDMS

## 2018-10-16 NOTE — Evaluation (Signed)
Occupational Therapy Evaluation Patient Details Name: [name] [name] MRN: [id number] DOB: March 31, 1928 Today's Date: 10/16/2018    History of Present Illness Presented with intermittent episodes of lightheadedness feels like she is about a pass out cannot see very well everything goes black and she becomes disoriented   Clinical Impression   Pt with decline in function and safety with ADLs and ADL mobility with decreased balance, strength and endurance. Pt live sat home alone and has a daughter that checks in on her. Pt reports that she is very active, independent with ADLs, uses cane sometimes and had daughter for transportation. Pt reports she no longer has vision impairments and that she feels better. Pt currently requires min guard A for transfers, LB ADLs and standing at sink for ADL/grooming tasks. Pt would benefit from skilled OT services to address impairments to maximize level of function and safety    Follow Up Recommendations  No OT follow up;Supervision - Intermittent    Equipment Recommendations  None recommended by OT;Tub/shower bench;3 in 1 bedside commode    Recommendations for Other Services       Precautions / Restrictions Precautions Precautions: Fall Restrictions Weight Bearing Restrictions: No      Mobility Bed Mobility Overal bed mobility: Needs Assistance Bed Mobility: Supine to Sit     Supine to sit: Supervision        Transfers Overall transfer level: Needs assistance Equipment used: 1 person hand held assist Transfers: Sit to/from Stand Sit to Stand: Min guard              Balance Overall balance assessment: Needs assistance Sitting-balance support: No upper extremity supported;Feet supported Sitting balance-Leahy Scale: Good     Standing balance support: Single extremity supported;Bilateral upper extremity supported;During functional activity Standing balance-Leahy Scale: Poor                             ADL either  performed or assessed with clinical judgement   ADL Overall ADL's : Needs assistance/impaired Eating/Feeding: Independent;Sitting   Grooming: Wash/dry hands;Wash/dry face;Brushing hair;Standing;Min guard   Upper Body Bathing: Set up;Sitting   Lower Body Bathing: Min guard;Sit to/from stand   Upper Body Dressing : Set up;Sitting   Lower Body Dressing: Min guard;Sit to/from stand   Toilet Transfer: Min guard;Ambulation;Grab bars;Cueing for safety   Toileting- Clothing Manipulation and Hygiene: Supervision/safety;Sit to/from stand   Tub/ Shower Transfer: Min guard;Ambulation;Grab bars;Cueing for safety   Functional mobility during ADLs: Min guard       Vision Baseline Vision/History: Wears glasses Wears Glasses: At all times Patient Visual Report: No change from baseline       Perception     Praxis      Pertinent Vitals/Pain Pain Assessment: No/denies pain     Hand Dominance Right   Extremity/Trunk Assessment Upper Extremity Assessment Upper Extremity Assessment: Generalized weakness   Lower Extremity Assessment Lower Extremity Assessment: Defer to PT evaluation       Communication Communication Communication: HOH   Cognition Arousal/Alertness: Awake/alert Behavior During Therapy: WFL for tasks assessed/performed Overall Cognitive Status: No family/caregiver present to determine baseline cognitive functioning                                     General Comments       Exercises     Shoulder Instructions      Home Living  Family/patient expects to be discharged to:: Private residence Living Arrangements: Alone Available Help at Discharge: Family;Available PRN/intermittently Type of Home: Mobile home Home Access: Stairs to enter Entrance Stairs-Number of Steps: 5 Entrance Stairs-Rails: Right;Left Home Layout: One level     Bathroom Shower/Tub: Teacher, early years/pre: Standard Bathroom Accessibility: Yes   Home  Equipment: Cane - single point;Cane - quad          Prior Functioning/Environment Level of Independence: Independent with assistive device(s)        Comments: pt does not drive, children drive her places.        OT Problem List: Decreased strength;Impaired balance (sitting and/or standing);Decreased knowledge of use of DME or AE;Decreased activity tolerance      OT Treatment/Interventions:      OT Goals(Current goals can be found in the care plan section) Acute Rehab OT Goals Patient Stated Goal: go home OT Goal Formulation: With patient Time For Goal Achievement: 10/23/18 Potential to Achieve Goals: Good ADL Goals Pt Will Perform Grooming: with supervision;with set-up;with modified independence;standing Pt Will Perform Lower Body Bathing: with supervision;with modified independence;sit to/from stand Pt Will Perform Lower Body Dressing: with supervision;with modified independence;sit to/from stand Pt Will Transfer to Toilet: with supervision;with modified independence;ambulating;grab bars Pt Will Perform Toileting - Clothing Manipulation and hygiene: with modified independence;sit to/from stand Pt Will Perform Tub/Shower Transfer: with supervision;with modified independence;ambulating;grab bars  OT Frequency: Min 2X/week   Barriers to D/C:    no barriers       Co-evaluation              AM-PAC OT "6 Clicks" Daily Activity     Outcome Measure Help from another person eating meals?: None Help from another person taking care of personal grooming?: A Little Help from another person toileting, which includes using toliet, bedpan, or urinal?: A Little Help from another person bathing (including washing, rinsing, drying)?: A Little Help from another person to put on and taking off regular upper body clothing?: None Help from another person to put on and taking off regular lower body clothing?: A Little 6 Click Score: 20   End of Session Equipment Utilized During  Treatment: Gait belt  Activity Tolerance: Patient tolerated treatment well Patient left: in chair;with call bell/phone within reach;with chair alarm set  OT Visit Diagnosis: Unsteadiness on feet (R26.81);Other abnormalities of gait and mobility (R26.89);Muscle weakness (generalized) (M62.81)                Time: 0076-2263 OT Time Calculation (min): 38 min Charges:  OT General Charges $OT Visit: 1 Visit OT Evaluation $OT Eval Low Complexity: 1 Low OT Treatments $Self Care/Home Management : 8-22 mins    Britt Bottom 10/16/2018, 12:50 PM

## 2018-10-16 NOTE — Progress Notes (Signed)
PROGRESS NOTE  [name] [name] QHU:[id number] DOB: 1927/06/15 DOA: 10/15/2018 PCP: Vivi Barrack, MD  Brief History   [name] [name] is a 85 y.o. female with medical history significant of thyroid cancer, hypoparathyroidism, CKD    Presented with intermittent episodes of lightheadedness feels like she is about a pass out cannot see very well everything goes black and she becomes disoriented she has had a number this episodes the past 3 days last episode was today. She has had an headache she feels like she is about to pass out but never really did. Patient today went to urgent to get this evaluated. She had a chest pain or shortness of breath no palpitations. Reports her right leg has been feeling weaker ans swollen.  Consultants  . None  Procedures  . None  Antibiotics   Anti-infectives (From admission, onward)   Start     Dose/Rate Route Frequency Ordered Stop   10/16/18 2200  valACYclovir (VALTREX) tablet 1,000 mg  Status:  Discontinued     1,000 mg Oral 2 times daily 10/16/18 1752 10/16/18 1753   10/16/18 2100  cefTRIAXone (ROCEPHIN) 1 g in sodium chloride 0.9 % 100 mL IVPB     1 g 200 mL/hr over 30 Minutes Intravenous Every 24 hours 10/16/18 0114     10/16/18 1800  valACYclovir (VALTREX) tablet 1,000 mg     1,000 mg Oral 2 times daily 10/16/18 1753 10/23/18 2159   10/16/18 1730  valACYclovir (VALTREX) tablet 500 mg  Status:  Discontinued    Note to Pharmacy: OP WSF:KCLE 1 pill daily for 5 days for outbreaks. Patient taking differently: Take 500 mg (1 tablet) by mouth once a day for 5 days for Shingles-related outbreaks     500 mg Oral See admin instructions 10/16/18 1730 10/16/18 1752   10/15/18 2200  cefTRIAXone (ROCEPHIN) 1 g in sodium chloride 0.9 % 100 mL IVPB     1 g 200 mL/hr over 30 Minutes Intravenous  Once 10/15/18 2148 10/16/18 0114    .   Subjective  The patient is seen walking with PT in the hall. No new complaints.  Objective   Vitals:  Vitals:    10/16/18 0030 10/16/18 0116  BP: (!) 176/79 (!) 181/89  Pulse: 70 68  Resp: 16 19  Temp:  97.7 F (36.5 C)  SpO2: 97% 95%    Exam:  Constitutional:  . The patient is resting comfortably. No new complaints. Respiratory:  . No increased work of breathing.  . No wheezes, rales, or rhonchi. . No tactile fremitus. Cardiovascular:  . Regular rate and rhythm . No LE extremity edema   . Normal pedal pulses Abdomen:  . Abdomen is soft, non-tender, non-distended. . No hernias, masses, or organomegaly is appreciated. . Normoactive bowel sounds. Musculoskeletal:  . No cyanosis, clubbing, or edema. Skin:  . No rashes, lesions, ulcers . palpation of skin: no induration or nodules Neurologic:  . CN 2-12 intact . Sensation all 4 extremities intact Psychiatric:  . Mental status o Mood, affect appropriate o Orientation to person, place, time  . judgment and insight appear intact    I have personally reviewed the following:   Today's Data  . Vitals, Troponins, CBC, CMP, positive D Dimer, UA  Scheduled Meds: . [START ON 10/17/2018] calcitRIOL  0.25 mcg Oral Q T,Th,S,Su  . levothyroxine  100 mcg Oral QAC breakfast  . loratadine  10 mg Oral Daily  . sodium chloride flush  3 mL Intravenous Once  Continuous Infusions: . cefTRIAXone (ROCEPHIN)  IV      Active Problems:   Syncope   Acute lower UTI   Right leg weakness   Pre-syncope   LOS: 0 days   A & P   Pre Syncope: Given risk factors the patient was admitted and rehydrated. Echocardiogram was negative for source of arrhythmia. Carotid dopplers are pending.  She will be monitored on tele and obtain carotid dopplers. Consider even monitor for discharge.  Concern for stroke: Right leg weakness and swellilng. MRI and CT head were negative. Echocardiogram demonstrated no intracardiac source for emboli. Pt is working with PT. Await their evaluation.  Shingles: Pt with rash across buttock. She states that the rash is commonly  what she gets with her shingles outbreak.  Acute lower UTI: Urine cultures are pending. Will also obtain blood cultures. She is receiving empiric Rocephin IV.  Right leg weakness/swelling:  Will doppler right lower extremity and check a CTA chest.   Hypothyroidism: Check TSH continue home medications at current dose. TSH is normal.   Elevated troponin: Flat to slowly trending down. Monitor. No ischemic EKG changes monitor on telemetry and cycle cardiac enzymes to trend. Elevation is in part due to CKD.  CKD: Chronic, avoid nephrotoxic medications and hypotension. Monitor electrolytes, creatinine, and volume status.  HTN: Elevated BP in ER. As there is no evidence of CVA will start the patient on Norvasc for Hypertension. Monitor and adjust as necessary.  I have seen and examined this patient myself. I have spent 35 minutes in her evaluation and care.  CODE STATUS: Full Code DVT Prophylaxis: SCD's Family Communication:  Disposition as per PT evaluation.  Saran Laviolette, DO [facility] Direct contact: see [email or web address]  7PM-7AM contact night coverage as above 10/16/2018, 5:52 PM  LOS: 0 days

## 2018-10-17 ENCOUNTER — Inpatient Hospital Stay (HOSPITAL_COMMUNITY): Payer: PPO

## 2018-10-17 ENCOUNTER — Encounter (HOSPITAL_COMMUNITY): Payer: Self-pay

## 2018-10-17 DIAGNOSIS — R7989 Other specified abnormal findings of blood chemistry: Secondary | ICD-10-CM

## 2018-10-17 DIAGNOSIS — M7989 Other specified soft tissue disorders: Secondary | ICD-10-CM

## 2018-10-17 DIAGNOSIS — I471 Supraventricular tachycardia: Principal | ICD-10-CM

## 2018-10-17 DIAGNOSIS — R55 Syncope and collapse: Secondary | ICD-10-CM

## 2018-10-17 DIAGNOSIS — B029 Zoster without complications: Secondary | ICD-10-CM

## 2018-10-17 DIAGNOSIS — I472 Ventricular tachycardia: Secondary | ICD-10-CM

## 2018-10-17 DIAGNOSIS — G9341 Metabolic encephalopathy: Secondary | ICD-10-CM

## 2018-10-17 LAB — CBC WITH DIFFERENTIAL/PLATELET
Abs Immature Granulocytes: 0.01 10*3/uL (ref 0.00–0.07)
Basophils Absolute: 0.1 10*3/uL (ref 0.0–0.1)
Basophils Relative: 1 %
Eosinophils Absolute: 0.2 10*3/uL (ref 0.0–0.5)
Eosinophils Relative: 3 %
HCT: 38.8 % (ref 36.0–46.0)
Hemoglobin: 12.5 g/dL (ref 12.0–15.0)
Immature Granulocytes: 0 %
Lymphocytes Relative: 20 %
Lymphs Abs: 1.5 10*3/uL (ref 0.7–4.0)
MCH: 28.3 pg (ref 26.0–34.0)
MCHC: 32.2 g/dL (ref 30.0–36.0)
MCV: 87.8 fL (ref 80.0–100.0)
Monocytes Absolute: 0.6 10*3/uL (ref 0.1–1.0)
Monocytes Relative: 8 %
Neutro Abs: 4.9 10*3/uL (ref 1.7–7.7)
Neutrophils Relative %: 68 %
Platelets: 295 10*3/uL (ref 150–400)
RBC: 4.42 MIL/uL (ref 3.87–5.11)
RDW: 13.4 % (ref 11.5–15.5)
WBC: 7.3 10*3/uL (ref 4.0–10.5)
nRBC: 0 % (ref 0.0–0.2)

## 2018-10-17 LAB — COMPREHENSIVE METABOLIC PANEL
ALT: 14 U/L (ref 0–44)
AST: 24 U/L (ref 15–41)
Albumin: 3.3 g/dL — ABNORMAL LOW (ref 3.5–5.0)
Alkaline Phosphatase: 46 U/L (ref 38–126)
Anion gap: 11 (ref 5–15)
BUN: 14 mg/dL (ref 8–23)
CO2: 24 mmol/L (ref 22–32)
Calcium: 7.2 mg/dL — ABNORMAL LOW (ref 8.9–10.3)
Chloride: 104 mmol/L (ref 98–111)
Creatinine, Ser: 0.9 mg/dL (ref 0.44–1.00)
GFR calc Af Amer: >60 mL/min (ref >60)
GFR calc non Af Amer: 57 mL/min — ABNORMAL LOW (ref >60)
Glucose, Bld: 107 mg/dL — ABNORMAL HIGH (ref 70–99)
Potassium: 3.7 mmol/L (ref 3.5–5.1)
Sodium: 139 mmol/L (ref 135–145)
Total Bilirubin: 0.5 mg/dL (ref 0.3–1.2)
Total Protein: 7 g/dL (ref 6.5–8.1)

## 2018-10-17 LAB — URINE CULTURE
Culture: <10000 — AB
Special Requests: NORMAL

## 2018-10-17 MED ORDER — VERAPAMIL HCL ER 120 MG PO TBCR
120.0000 mg | EXTENDED_RELEASE_TABLET | Freq: Every day | ORAL | Status: DC
  Administered 2018-10-17 – 2018-10-18 (×2): 120 mg via ORAL
  Filled 2018-10-17 (×2): qty 1

## 2018-10-17 NOTE — Progress Notes (Signed)
PROGRESS NOTE  [name] [name] FAO:[id number] DOB: 08-28-1927 DOA: 10/15/2018 PCP: Vivi Barrack, MD  Brief History   [name] [name] is a 85 y.o. female with medical history significant of thyroid cancer, hypoparathyroidism, CKD   Presented with intermittent episodes of lightheadedness feels like she is about a pass out cannot see very well everything goes black and she becomes disoriented she has had a number this episodes the past 3 days last episode was today. She has had an headache she feels like she is about to pass out but never really did. Patient today went to urgent to get this evaluated. She had a chest pain or shortness of breath no palpitations. Reports her right leg has been feeling weaker and swollen.  Echocardiogram demonstrated EF of 50-55%. Impaired relaxation. No intracardiac source of emboli identified. CTA chest was negative for pulmonary emboli. Left lower extremity doppler was negative for DVT.  Cardiology has been consulted for placement of monitoring devise to rule out arrhythmia as a cause for her pre-syncopal episodes as well as for the NSVT that telemetry recorded this morning.  Consultants  . Cardiology  Procedures  . None  Antibiotics   Anti-infectives (From admission, onward)   Start     Dose/Rate Route Frequency Ordered Stop   10/16/18 2200  valACYclovir (VALTREX) tablet 1,000 mg  Status:  Discontinued     1,000 mg Oral 2 times daily 10/16/18 1752 10/16/18 1753   10/16/18 2100  cefTRIAXone (ROCEPHIN) 1 g in sodium chloride 0.9 % 100 mL IVPB     1 g 200 mL/hr over 30 Minutes Intravenous Every 24 hours 10/16/18 0114     10/16/18 1800  valACYclovir (VALTREX) tablet 1,000 mg     1,000 mg Oral 2 times daily 10/16/18 1753 10/23/18 2159   10/16/18 1730  valACYclovir (VALTREX) tablet 500 mg  Status:  Discontinued    Note to Pharmacy: OP ONG:EXBM 1 pill daily for 5 days for outbreaks. Patient taking differently: Take 500 mg (1 tablet) by mouth once a day for 5  days for Shingles-related outbreaks     500 mg Oral See admin instructions 10/16/18 1730 10/16/18 1752   10/15/18 2200  cefTRIAXone (ROCEPHIN) 1 g in sodium chloride 0.9 % 100 mL IVPB     1 g 200 mL/hr over 30 Minutes Intravenous  Once 10/15/18 2148 10/16/18 0114      Subjective  The patient is awake, alert, and oriented x 3. No new complaints.  Objective   Vitals:  Vitals:   10/17/18 0518 10/17/18 1231  BP: (!) 148/69 128/78  Pulse: 66 87  Resp: 16 17  Temp: 98 F (36.7 C) 97.9 F (36.6 C)  SpO2: 95% 94%    Exam:  Constitutional:  . The patient is resting comfortably. No new complaints. Respiratory:  . No increased work of breathing.  . No wheezes, rales, or rhonchi. . No tactile fremitus. Cardiovascular:  . Regular rate and rhythm . No LE extremity edema   . Normal pedal pulses Abdomen:  . Abdomen is soft, non-tender, non-distended. . No hernias, masses, or organomegaly is appreciated. . Normoactive bowel sounds. Musculoskeletal:  . No cyanosis, clubbing, or edema. Skin:  . No rashes, lesions, ulcers . palpation of skin: no induration or nodules Neurologic:  . CN 2-12 intact . Sensation all 4 extremities intact Psychiatric:  . Mental status o Mood, affect appropriate o Orientation to person, place, time  . judgment and insight appear intact    I have  personally reviewed the following:   Today's Data  . Vitals, Troponins, CBC, CMP, positive D Dimer, UA  Scheduled Meds: . calcitRIOL  0.25 mcg Oral Q T,Th,S,Su  . levothyroxine  100 mcg Oral QAC breakfast  . loratadine  10 mg Oral Daily  . sodium chloride flush  3 mL Intravenous Once  . valACYclovir  1,000 mg Oral BID   Continuous Infusions: . cefTRIAXone (ROCEPHIN)  IV 1 g (10/16/18 2033)    Active Problems:   Syncope   Acute lower UTI   Right leg weakness   Pre-syncope   LOS: 1 day   A & P   Pre Syncope: Given risk factors the patient was admitted and rehydrated. Echocardiogram was  negative for source of emboli. Carotid dopplers demonstrate less than 50% stenosis bilaterally. CTA chest was negative for PE. Doppler of the right lower extremity was negative for DVT. She will be monitored on tele and obtain carotid dopplers. Cardiology has been consulted for placement of either loop recorder or event monitor for discharge as well as to evaluate the recent occurrence of NSVT seen on telemetry. I appreciate their assistance.  Concern for stroke:No evidence for stroke. MRI and CT head were negative. Echocardiogram demonstrated no intracardiac source for emboli. Pt is working with PT. PT has recommended home health PT.  Shingles: Pt with rash across buttock. She states that the rash is commonly what she gets with her shingles outbreak. She has been placed on treatment dose of Valcyclovir Acute lower UTI: Urine cultures have had insignificant growth. Blood cultures have demonstrated 1/2 positive for coag negative staph.. She is receiving empiric Rocephin IV.  Right leg weakness/swelling:  Will doppler right lower extremity and check a CTA chest.   Hypothyroidism: Check TSH continue home medications at current dose. TSH is normal.  Elevated troponin: Flat to slowly trending down. Monitor. No ischemic EKG changes monitor on telemetry and cycle cardiac enzymes to trend. Elevation is in part due to CKD.  CKD: Chronic, avoid nephrotoxic medications and hypotension. Monitor electrolytes, creatinine, and volume status.  HTN: Elevated BP in ER. As there is no evidence of CVA will start the patient on Norvasc for Hypertension. Monitor and adjust as necessary.  I have seen and examined this patient myself. I have spent 30 minutes in her evaluation and care.  CODE STATUS: Full Code DVT Prophylaxis: SCD's Family Communication:  Disposition as per PT evaluation.  Iseah Plouff, DO [facility] Direct contact: see [email or web address]  7PM-7AM contact night coverage as above  10/17/2018,  3:10 PM  LOS: 0 days

## 2018-10-17 NOTE — Plan of Care (Signed)
  Problem: Education: Goal: Knowledge of General Education information will improve Description: Including pain rating scale, medication(s)/side effects and non-pharmacologic comfort measures Outcome: Progressing   Problem: Health Behavior/Discharge Planning: Goal: Ability to manage health-related needs will improve Outcome: Progressing   Problem: Clinical Measurements: Goal: Will remain free from infection Outcome: Progressing   Problem: Safety: Goal: Ability to remain free from injury will improve Outcome: Progressing   Problem: Activity: Goal: Risk for activity intolerance will decrease Outcome: Progressing

## 2018-10-17 NOTE — Consult Note (Addendum)
Cardiology Consultation:   Patient ID: [name] [name] MRN: [id number]; DOB: [birth year]  Admit date: 10/15/2018 Date of Consult: 10/17/2018  Primary Care Provider: Vivi Barrack, MD Primary Cardiologist: No primary care provider on file. new Primary Electrophysiologist:  None    Patient Profile:   [name] [name] is a 85 y.o. female with a hx of thyroid cancer, CKD and hypoparathyroid who is being seen today for the evaluation of syncope and NSVT at the request of Dr. Benny Lennert.  History of Present Illness:   [name] [name] with no prior cardiac hx presented to ER 10/15/18 with several episodes of sudden headache and feeling she would pass out.  No loss of consciousness.  She also has Rt leg weakness and swelling.  She has shingles with rash across buttock.  Also acute UTI.  She had episode earlier in week standing in kitchen and everything became dark and she thought she would pass out, held onto the sink and it passed.  Lasted seconds.  Then in [place] on Tuesday she had 2-3 episodes.  Never passed out.  She had not had anymore.  She has not had chest pain or feeling of racing HR.    This AM had SVT rate of 168. Lasted about 11 sec.  She denies any symptoms at that time.    EKG:  The EKG was personally reviewed and demonstrates:  SR with LVH and Q waves in V1-3 all similar to EKG 01/2014  Telemetry:  Telemetry was personally reviewed and demonstrates:  SR and SVT.   HS troponin 82; 84; 81  Na 139 K+ 3.7 Cr 0.90 Ca= 7.2 LFTs WNL  hgb 12.5 Hct 38.8 WBC 7.3 DDimer 1.65 TSH 3.732 COVID neg.   MRI of brain no acute changes CT angio of chest :  IMPRESSION: 1. Negative for pulmonary embolus. 2. Heterogeneous pulmonary parenchyma suggesting small airways disease. No focal airspace disease to suggest pneumonia. 3. Mild cardiomegaly.  Small hiatal hernia. 4. Small bilateral pulmonary nodules largest measuring 6 mm in the right lower lobe. Non-contrast chest CT at 3-6 months is recommended.    Carotid dopplers 0-39% stenosis bil.  Echo 10/16/18 with EF 50-55% + impaired relaxation  No RWMA,  Currently with discussion pt said I ma having another episode her pulse was regular and normal when I checked but it was resolving lasted 3-4 seconds.  She was sitting. No pain and no SOB.  Tele does show SVT for 18 sec.  Rate 168-170.  Prior to Monday no symptoms.       Past Medical History:  Diagnosis Date   ALLERGIC RHINITIS 11/02/2006   CKD (chronic kidney disease), stage III ([place])    HYPERLIPIDEMIA 11/04/2006   HYPOTHYROIDISM, POSTSURGICAL 03/28/2007   Malignant neoplasm of thyroid gland ([place]) 03/28/2007   5/07: righte lobectomy--path shows 3 cm follicular ca 0/94: completion left lobectomy, no more tumor seen 09/07: thyroglobulin 0.2 9ab neg) 11/07: i-131 rx, 101 [facility] 6/08: thyroglogulin 0.2 (ab neg) , hypothyroid body scan neg (clinically tolerated hypothyroidism poorly) 06/09: tg undetectable (ab=1.5, normal) 12/10: tg undetectable (ab neg) 6/11: tg undectable (ab neg)   OSTEOPENIA 11/02/2006    Past Surgical History:  Procedure Laterality Date   ABDOMINAL HYSTERECTOMY     CERVICAL POLYPECTOMY     OPEN REDUCTION INTERNAL FIXATION (ORIF) DISTAL RADIAL FRACTURE Left 01/14/2014   Procedure: OPEN REDUCTION INTERNAL FIXATION DISTAL RADIAL FRACTURE;  Surgeon: Linna Hoff, MD;  Location: [facility];  Service: Orthopedics;  Laterality: Left;  Home Medications:  Prior to Admission medications   Medication Sig Start Date End Date Taking? Authorizing Provider  calcitRIOL (ROCALTROL) 0.25 MCG capsule Take 1 capsule (0.25 mcg total) by mouth 4 (four) times a week. 11/22/17  Yes Renato Shin, MD  calcium carbonate (OS-CAL) 600 MG TABS Take 600 mg by mouth at bedtime.    Yes [provider]  cetirizine (ZYRTEC) 10 MG tablet Take 10 mg by mouth daily as needed for allergies or rhinitis.    Yes [provider]  docusate sodium (COLACE) 100 MG capsule Take 100 mg by mouth  at bedtime.    Yes [provider]  levothyroxine (SYNTHROID) 100 MCG tablet Take 1 tablet (100 mcg total) by mouth daily before breakfast. 08/19/18  Yes Renato Shin, MD  Menthol-Ascorbic Acid (LUDENS COUGH DROPS MT) Use as directed 1 drop in the mouth or throat as needed (dry throat).   Yes [provider]  Multiple Vitamins-Minerals (MULTIVITAMINS THER. W/MINERALS) TABS Take 1 tablet by mouth daily.     Yes [provider]  polyethylene glycol powder (GLYCOLAX/MIRALAX) 17 GM/SCOOP powder Take 4.25-8.5 g by mouth at bedtime.   Yes [provider]  valACYclovir (VALTREX) 500 MG tablet Take 1 pill daily for 5 days for outbreaks. Patient taking differently: Take 500 mg by mouth See admin instructions. Take 500 mg (1 tablet) by mouth once a day for 5 days for Shingles-related outbreaks 05/07/18  Yes Vivi Barrack, MD  vitamin C (ASCORBIC ACID) 500 MG tablet Take 1 tablet (500 mg total) by mouth daily. 01/14/14  Yes Iran Planas, MD  triamcinolone cream (KENALOG) 0.1 % Apply 1 application topically 2 (two) times daily. Patient not taking: Reported on 10/15/2018 05/07/18   Vivi Barrack, MD    Inpatient Medications: Scheduled Meds:  calcitRIOL  0.25 mcg Oral Q T,Th,S,Su   levothyroxine  100 mcg Oral QAC breakfast   loratadine  10 mg Oral Daily   sodium chloride flush  3 mL Intravenous Once   valACYclovir  1,000 mg Oral BID   Continuous Infusions:  cefTRIAXone (ROCEPHIN)  IV 1 g (10/16/18 2033)   PRN Meds: acetaminophen **OR** acetaminophen, ondansetron **OR** ondansetron (ZOFRAN) IV  Allergies:   No Known Allergies  Social History:   Social History   Socioeconomic History   Marital status: Married    Spouse name: Not on file   Number of children: Not on file   Years of education: Not on file   Highest education level: Not on file  Occupational History   Not on file  Social Needs   Financial resource strain: Not on file   Food  insecurity    Worry: Not on file    Inability: Not on file   Transportation needs    Medical: Not on file    Non-medical: Not on file  Tobacco Use   Smoking status: Never Smoker   Smokeless tobacco: Never Used  Substance and Sexual Activity   Alcohol use: Not on file   Drug use: Not on file   Sexual activity: Not on file  Lifestyle   Physical activity    Days per week: Not on file    Minutes per session: Not on file   Stress: Not on file  Relationships   Social connections    Talks on phone: Not on file    Gets together: Not on file    Attends religious service: Not on file    Active member of club or organization:  Not on file    Attends meetings of clubs or organizations: Not on file    Relationship status: Not on file   Intimate partner violence    Fear of current or ex partner: Not on file    Emotionally abused: Not on file    Physically abused: Not on file    Forced sexual activity: Not on file  Other Topics Concern   Not on file  Social History Narrative   Doesn't drive    Family History:    Family History  Problem Relation Age of Onset   Diabetes Sister    Mother had syncope  Father had cancer  ROS:  Please see the history of present illness.  General:no colds or fevers, no weight changes Skin:no rashes or ulcers HEENT:no blurred vision, no congestion CV:see HPI PUL:see HPI GI:no diarrhea constipation or melena, no indigestion GU:no hematuria, no dysuria MS:no joint pain, no claudication Neuro:no syncope, no lightheadedness Endo:no diabetes, + thyroid disease  All other ROS reviewed and negative.     Physical Exam/Data:   Vitals:   10/16/18 0139 10/16/18 2132 10/17/18 0518 10/17/18 1231  BP:  (!) 177/78 (!) 148/69 128/78  Pulse:  70 66 87  Resp:  20 16 17   Temp:  97.9 F (36.6 C) 98 F (36.7 C) 97.9 F (36.6 C)  TempSrc:    Oral  SpO2:  98% 95% 94%  Weight: 64 kg     Height: 5\' 4"  (1.626 m)       Intake/Output Summary (Last  24 hours) at 10/17/2018 1406 Last data filed at 10/17/2018 1100 Gross per 24 hour  Intake 460 ml  Output --  Net 460 ml   Last 3 Weights 10/16/2018 06/13/2018 05/07/2018  Weight (lbs) 141 lb 141 lb 8 oz 142 lb  Weight (kg) 63.957 kg 64.184 kg 64.411 kg     Body mass index is 24.2 kg/m.  General:  Well nourished, well developed, in no acute distress then developed episode but she had no outward signs, she told me things were getting dark.   HEENT: normal Lymph: no adenopathy Neck: no JVD Endocrine:  No thryomegaly Vascular: No carotid bruits; pedal pulses 2+ bilaterally  Cardiac:  normal S1, S2; RRR; no murmur gallup rub or click Lungs:  clear to auscultation bilaterally, no wheezing, rhonchi or rales  Abd: soft, nontender, no hepatomegaly  Ext: no edema in lt leg, tr in rt with tenderness rt calf Musculoskeletal:  No deformities, BUE and BLE strength normal and equal Skin: warm and dry  Neuro:  Alert and oriented X 3, MAE follows commands no focal abnormalities noted Psych:  Normal affect    Relevant CV Studies: Echo 10/16/18 IMPRESSIONS    1. The left ventricle has low normal systolic function, with an ejection fraction of 50-55%. The cavity size was normal. There is mildly increased left ventricular wall thickness. Left ventricular diastolic Doppler parameters are consistent with  impaired relaxation. Elevated left ventricular end-diastolic pressure No evidence of left ventricular regional wall motion abnormalities.  2. The right ventricle has normal systolic function. The cavity was normal. There is no increase in right ventricular wall thickness.  3. The aortic valve was not well visualized. Moderate sclerosis of the aortic valve. Aortic valve regurgitation was not assessed by color flow Doppler. Moderate aortic annular calcification noted.  4. The mitral valve is degenerative. Mild thickening of the mitral valve leaflet. There is moderate mitral annular calcification  present.  FINDINGS  Left Ventricle: The  left ventricle has low normal systolic function, with an ejection fraction of 50-55%. The cavity size was normal. There is mildly increased left ventricular wall thickness. Left ventricular diastolic Doppler parameters are consistent  with impaired relaxation. Elevated left ventricular end-diastolic pressure No evidence of left ventricular regional wall motion abnormalities..  Right Ventricle: The right ventricle has normal systolic function. The cavity was normal. There is no increase in right ventricular wall thickness.  Left Atrium: Left atrial size was normal in size.  Right Atrium: Right atrial size was normal in size. Right atrial pressure is estimated at 10 mmHg.  Interatrial Septum: No atrial level shunt detected by color flow Doppler.  Pericardium: There is no evidence of pericardial effusion.  Mitral Valve: The mitral valve is degenerative in appearance. Mild thickening of the mitral valve leaflet. There is moderate mitral annular calcification present. Mitral valve regurgitation is trivial by color flow Doppler.  Tricuspid Valve: The tricuspid valve is normal in structure. Tricuspid valve regurgitation is trivial by color flow Doppler.  Aortic Valve: The aortic valve was not well visualized Moderate sclerosis of the aortic valve. Aortic valve regurgitation was not assessed by color flow Doppler. Moderate aortic annular calcification noted.  Pulmonic Valve: The pulmonic valve was normal in structure. Pulmonic valve regurgitation was not assessed by color flow Doppler. No evidence of pulmonic stenosis.  Venous: The inferior vena cava is normal in size with greater than 50% respiratory variability.      Laboratory Data:  High Sensitivity Troponin:   Recent Labs  Lab 10/15/18 2012 10/15/18 2212 10/16/18 0023 10/16/18 0303  TROPONINIHS 82* 84* 81* 67*     Cardiac EnzymesNo results for input(s): TROPONINI in the last 168  hours. No results for input(s): TROPIPOC in the last 168 hours.  Chemistry Recent Labs  Lab 10/15/18 1748 10/16/18 0303 10/17/18 0357  NA 139 136 139  K 3.8 3.8 3.7  CL 100 98 104  CO2 29 29 24   GLUCOSE 110* 185* 107*  BUN 15 14 14   CREATININE 1.00 1.02* 0.90  CALCIUM 7.8* 7.5* 7.2*  GFRNONAA 50* 49* 57*  GFRAA 58* 57* >60  ANIONGAP 10 9 11     Recent Labs  Lab 10/16/18 0303 10/17/18 0357  PROT 8.1 7.0  ALBUMIN 3.9 3.3*  AST 27 24  ALT 18 14  ALKPHOS 55 46  BILITOT 0.6 0.5   Hematology Recent Labs  Lab 10/15/18 1748 10/16/18 0303 10/17/18 0357  WBC 8.1 10.1 7.3  RBC 4.46 4.76 4.42  HGB 12.6 13.3 12.5  HCT 39.9 42.2 38.8  MCV 89.5 88.7 87.8  MCH 28.3 27.9 28.3  MCHC 31.6 31.5 32.2  RDW 13.4 13.3 13.4  PLT 318 328 295   BNPNo results for input(s): BNP, PROBNP in the last 168 hours.  DDimer  Recent Labs  Lab 10/16/18 0553  DDIMER 1.65*     Radiology/Studies:  Ct Head Wo Contrast  Result Date: 10/15/2018 CLINICAL DATA:  Headaches and near syncopal episode EXAM: CT HEAD WITHOUT CONTRAST TECHNIQUE: Contiguous axial images were obtained from the base of the skull through the vertex without intravenous contrast. COMPARISON:  None. FINDINGS: Brain: Scattered areas of decreased attenuation are noted in the deep white matter consistent with chronic white matter ischemic change. No findings to suggest acute hemorrhage, acute infarction or space-occupying mass lesion are seen. Vascular: No hyperdense vessel or unexpected calcification. Skull: Normal. Negative for fracture or focal lesion. Sinuses/Orbits: No acute finding. Other: None. IMPRESSION: Chronic white matter ischemic change without  acute abnormality. Electronically Signed   By: Inez Catalina M.D.   On: 10/15/2018 20:29   Ct Angio Chest Pe W Or Wo Contrast  Result Date: 10/16/2018 CLINICAL DATA:  PE suspected, high pretest prob EXAM: CT ANGIOGRAPHY CHEST WITH CONTRAST TECHNIQUE: Multidetector CT imaging of the  chest was performed using the standard protocol during bolus administration of intravenous contrast. Multiplanar CT image reconstructions and MIPs were obtained to evaluate the vascular anatomy. CONTRAST:  19mL OMNIPAQUE IOHEXOL 350 MG/ML SOLN COMPARISON:  Chest radiograph yesterday. FINDINGS: Cardiovascular: There are no filling defects within the pulmonary arteries to suggest pulmonary embolus. Aortic atherosclerosis and tortuosity. No periaortic stranding. Mild cardiomegaly. No pericardial effusion. Mediastinum/Nodes: No enlarged mediastinal or hilar lymph nodes. Small hiatal hernia. Esophagus otherwise decompressed. Prior thyroidectomy. Lungs/Pleura: Mild atelectasis in the paramediastinal right middle lobe and lingula. Heterogeneous pulmonary parenchyma suggesting small airways disease. No focal airspace disease to suggest pneumonia. No evidence of pulmonary edema. No pleural fluid. Trachea and mainstem bronchi are patent. 6 mm right lower lobe subpleural pulmonary nodule, image 96 series 8. Perifissural 4 mm nodule superior segment left lower lobe image 39 series 8. Upper Abdomen: No acute abnormality. Musculoskeletal: There are no acute or suspicious osseous abnormalities. Mild for age degenerative change in the spine. Degenerative change of both shoulders. Review of the MIP images confirms the above findings. IMPRESSION: 1. Negative for pulmonary embolus. 2. Heterogeneous pulmonary parenchyma suggesting small airways disease. No focal airspace disease to suggest pneumonia. 3. Mild cardiomegaly.  Small hiatal hernia. 4. Small bilateral pulmonary nodules largest measuring 6 mm in the right lower lobe. Non-contrast chest CT at 3-6 months is recommended. If the nodules are stable at time of repeat CT, then future CT at 18-24 months (from today's scan) is considered optional for low-risk patients, but is recommended for high-risk patients. This recommendation follows the consensus statement: Guidelines for  Management of Incidental Pulmonary Nodules Detected on CT Images: From the Fleischner Society 2017; Radiology 2017; 284:228-243. Aortic Atherosclerosis (ICD10-I70.0). Electronically Signed   By: Keith Rake M.D.   On: 10/16/2018 23:01   Mr Brain Wo Contrast  Result Date: 10/16/2018 CLINICAL DATA:  85 year old female with headache, near syncope, disoriented, focal neuro deficit. EXAM: MRI HEAD WITHOUT CONTRAST TECHNIQUE: Multiplanar, multiecho pulse sequences of the brain and surrounding structures were obtained without intravenous contrast. COMPARISON:  Head CT without contrast 10/15/2018. FINDINGS: Brain: No restricted diffusion to suggest acute infarction. No midline shift, mass effect, evidence of mass lesion, ventriculomegaly, extra-axial collection or acute intracranial hemorrhage. Cervicomedullary junction and pituitary are within normal limits. Patchy and scattered cerebral white matter T2 and FLAIR hyperintensity. This is confluent areas, but overall mild to moderate for age. No cortical encephalomalacia. T2 heterogeneity in the deep gray matter nuclei, mostly the left basal ganglia at the site of a chronic lacunar infarct with perhaps mild hemosiderin. Comparatively mild T2 heterogeneity in the pons. Negative cerebellum. No other chronic cerebral blood products. Vascular: Major intracranial vascular flow voids are preserved. The left vertebral artery appears dominant. There is a degree of generalized intracranial artery tortuosity. Skull and upper cervical spine: Disc and endplate degeneration at C3-C4 with mild spinal stenosis. Visualized bone marrow signal is within normal limits. Sinuses/Orbits: Negative visible orbits. Trace paranasal sinus mucosal thickening. Other: Mastoids are clear. Visible internal auditory structures appear normal. Scalp and face soft tissues appear negative. IMPRESSION: 1.  No acute intracranial abnormality. 2. Moderate for age chronic small vessel disease. Electronically  Signed   By: Lemmie Evens  [place] Crane M.D.   On: 10/16/2018 07:06   Dg Chest Port 1 View  Result Date: 10/15/2018 CLINICAL DATA:  Near syncope with headache and weakness. EXAM: PORTABLE CHEST 1 VIEW COMPARISON:  01/14/2014 FINDINGS: Cardiac silhouette is mildly enlarged. No mediastinal or hilar masses. Lungs are hyperexpanded. There are prominent bronchovascular markings most evident in the lower lungs. No evidence of pneumonia or pulmonary edema. No pleural effusion or pneumothorax. Postsurgical changes at the neck base consistent with prior thyroid surgery. Skeletal structures are demineralized but grossly intact. IMPRESSION: 1. No acute cardiopulmonary disease. 2. Stable cardiomegaly and chronic lung changes. Electronically Signed   By: Lajean Manes M.D.   On: 10/15/2018 20:56   Vas US Carotid  Result Date: 10/16/2018 Carotid Arterial Duplex Study Indications:       Syncope. Risk Factors:      Hyperlipidemia. Comparison Study:  No prior study. Performing Technologist: Maudry Mayhew RDMS, RVT, RDCS  Examination Guidelines: A complete evaluation includes B-mode imaging, spectral Doppler, color Doppler, and power Doppler as needed of all accessible portions of each vessel. Bilateral testing is considered an integral part of a complete examination. Limited examinations for reoccurring indications may be performed as noted.  Right Carotid Findings: +----------+--------+--------+--------+--------------------------+--------+             PSV cm/s EDV cm/s Stenosis Describe                   Comments  +----------+--------+--------+--------+--------------------------+--------+  CCA Prox   53       8                                                      +----------+--------+--------+--------+--------------------------+--------+  CCA Distal 53       14                irregular and heterogenous           +----------+--------+--------+--------+--------------------------+--------+  ICA Prox   37       11                smooth and  heterogenous              +----------+--------+--------+--------+--------------------------+--------+  ICA Distal 52       13                                                     +----------+--------+--------+--------+--------------------------+--------+  ECA        40       5                 heterogenous and irregular           +----------+--------+--------+--------+--------------------------+--------+ +----------+--------+-------+----------------+-------------------+             PSV cm/s EDV cms Describe         Arm Pressure (mmHG)  +----------+--------+-------+----------------+-------------------+  Subclavian 69               Multiphasic, WNL                      +----------+--------+-------+----------------+-------------------+ +---------+--------+--+--------+-+---------+  Vertebral PSV cm/s 23 EDV cm/s 4 Antegrade  +---------+--------+--+--------+-+---------+  Left Carotid  Findings: +----------+-------+-------+--------+------------------------+-----------------+             PSV     EDV     Stenosis Describe                 Comments                       cm/s    cm/s                                                         +----------+-------+-------+--------+------------------------+-----------------+  CCA Prox   57      12                                        intimal                                                                          thickening         +----------+-------+-------+--------+------------------------+-----------------+  CCA Distal 49      14               smooth and heterogenous                     +----------+-------+-------+--------+------------------------+-----------------+  ICA Prox   30      11               smooth and heterogenous                     +----------+-------+-------+--------+------------------------+-----------------+  ICA Distal 48      14                                                            +----------+-------+-------+--------+------------------------+-----------------+  ECA        47                       irregular and                                                                    heterogenous                                +----------+-------+-------+--------+------------------------+-----------------+ +----------+--------+--------+----------------+-------------------+  Subclavian PSV cm/s EDV cm/s Describe         Arm Pressure (mmHG)  +----------+--------+--------+----------------+-------------------+             99  Multiphasic, WNL                      +----------+--------+--------+----------------+-------------------+ +---------+--------+--+--------+--+---------+  Vertebral PSV cm/s 26 EDV cm/s 10 Antegrade  +---------+--------+--+--------+--+---------+  Summary: Right Carotid: Velocities in the right ICA are consistent with a 1-39% stenosis. Left Carotid: Velocities in the left ICA are consistent with a 1-39% stenosis. Vertebrals:  Bilateral vertebral arteries demonstrate antegrade flow. Subclavians: Normal flow hemodynamics were seen in bilateral subclavian              arteries. *See table(s) above for measurements and observations.  Electronically signed by Curt Jews MD on 10/16/2018 at 3:15:42 PM.    Final    Vas [place] Lower Extremity Venous (dvt)  Result Date: 10/17/2018  Lower Venous Study Indications: Edema right lower extremity. Elevated d-Dimer  Risk Factors: Pre syncope. Comparison Study: No previous exam available for comparison. Performing Technologist: Toma Copier RVS  Examination Guidelines: A complete evaluation includes B-mode imaging, spectral Doppler, color Doppler, and power Doppler as needed of all accessible portions of each vessel. Bilateral testing is considered an integral part of a complete examination. Limited examinations for reoccurring indications may be performed as noted.  +---------+---------------+---------+-----------+----------+-------+   RIGHT     Compressibility Phasicity Spontaneity Properties Summary  +---------+---------------+---------+-----------+----------+-------+  CFV       Full            Yes       Yes                             +---------+---------------+---------+-----------+----------+-------+  SFJ       Full                                                      +---------+---------------+---------+-----------+----------+-------+  FV Prox   Full            Yes       Yes                             +---------+---------------+---------+-----------+----------+-------+  FV Mid    Full                                                      +---------+---------------+---------+-----------+----------+-------+  FV Distal Full            Yes       Yes                             +---------+---------------+---------+-----------+----------+-------+  PFV       Full            Yes       Yes                             +---------+---------------+---------+-----------+----------+-------+  POP       Full            Yes       Yes                             +---------+---------------+---------+-----------+----------+-------+  PTV       Full                                                      +---------+---------------+---------+-----------+----------+-------+  PERO      Full                                                      +---------+---------------+---------+-----------+----------+-------+   +----+---------------+---------+-----------+----------+-------+  LEFT Compressibility Phasicity Spontaneity Properties Summary  +----+---------------+---------+-----------+----------+-------+  CFV  Full            Yes       Yes                             +----+---------------+---------+-----------+----------+-------+  SFJ  Full                                                      +----+---------------+---------+-----------+----------+-------+     Summary: Right: There is no evidence of deep vein thrombosis in the lower extremity. No cystic structure found in  the popliteal fossa. Left: There is no evidence of a common femoral vein obstruction.  *See table(s) above for measurements and observations.    Preliminary     Assessment and Plan:   1. PSVT today she denies being aware or any symptoms of first but length of time in rhythm was 11 sec, this afternoon was symptomatic and lasted 18 sec. Room became dark per pt. No syncope  2. Elevated troponin but flat may be from SVT  3. Rt leg weakness and swelling  4. Hypothyroid with stable TSH       For questions or updates, please contact [place] Please consult [email or web address] for contact info under     Signed, Cecilie Kicks, NP  10/17/2018 2:06 PM  Personally seen and examined. Agree with above.   85 year old with no prior cardiac history with syncope and newly discovered concomitant paroxysmal supraventricular tachycardia.  GEN: Well nourished, well developed, in no acute distress elderly HEENT: normal  Neck: no JVD, carotid bruits, or masses Cardiac: RRR; 1/6 systolic murmur, no rubs, or gallops,no edema  Respiratory:  clear to auscultation bilaterally, normal work of breathing GI: soft, nontender, nondistended, + BS MS: no deformity or atrophy  Skin: warm and dry, no rash Neuro:  Alert and Oriented x 3, Strength and sensation are intact Psych: euthymic mood, full affect  Creatinine 0.9 hemoglobin 12.5 TSH 3.7  EKG-sinus rhythm with left axis deviation  Assessment and plan:  Paroxysmal supraventricular tachycardia/syncope - Heart rate approximately 160 to 180 bpm, narrow complex.  Could be paroxysmal atrial tachycardia.  Showed strips to Dr. Rayann Heman of EP.  This was symptomatic while Cecilie Kicks, NP was in the room with loss of vision and spontaneous recovery after return to sinus rhythm.  Unusual for a supraventricular tachycardia to cause frank syncope however given her advanced age, usually low normal blood pressure, this could certainly be a possibility.  Minimal carotid  artery  plaque.  MRI brain normal. - Given her advanced age, I would like to try medications first.  We will start with verapamil.  Aortic sclerosis with no stenosis - Soft murmur heard on exam.  No significant aortic stenosis identified.    Candee Furbish, MD

## 2018-10-17 NOTE — Progress Notes (Signed)
Notified by Central Telemetry Patient has runs of SVT.  One asymptomatic episode this morning and another episode this afternoon with some dizziness.  MD aware.

## 2018-10-17 NOTE — Progress Notes (Signed)
Right lower extremity venous duplex completed. Preliminary results in Chart review CV Proc. [facility], [place] 10/17/2018, 11:06 AM

## 2018-10-17 NOTE — Progress Notes (Signed)
Spoke to both daughter's. Updated them on plan of care and pt condition. Answered questions to satisfaction.

## 2018-10-17 NOTE — Progress Notes (Signed)
Occupational Therapy Treatment Patient Details Name: [name] [name] MRN: [id number] DOB: 1928/01/02 Today's Date: 10/17/2018    History of present illness 85 yo female admitted to ED on 7/7 with headaches, near syncope, and generalized weakness. Pt with UTI, RLE weakness and swelling. Pt also with elevated troponins, now downtrending with echo results WNL. PMH includes CKD, HLD, thyroid cancer s/p R lobectomy, osteopenia, ORIF L radius 2015.   OT comments  Pt progressing toward established goals. Pt currently requires minguard for toileting, LB dressing, and grooming and minguard for functional mobility at RW level. Pt will continue to benefit from skilled OT services to maximize safety and independence with ADL/IADL and functional mobility. Will continue to follow acutely and progress as tolerated.    Follow Up Recommendations  No OT follow up;Supervision - Intermittent    Equipment Recommendations  None recommended by OT    Recommendations for Other Services      Precautions / Restrictions Precautions Precautions: Fall Restrictions Weight Bearing Restrictions: No       Mobility Bed Mobility Overal bed mobility: Needs Assistance Bed Mobility: Supine to Sit     Supine to sit: Supervision        Transfers Overall transfer level: Needs assistance Equipment used: None Transfers: Sit to/from Stand;Stand Pivot Transfers Sit to Stand: Min guard Stand pivot transfers: Min guard       General transfer comment: minguard for safety;vc for safe hand placement    Balance Overall balance assessment: Needs assistance Sitting-balance support: No upper extremity supported;Feet supported Sitting balance-Leahy Scale: Good     Standing balance support: During functional activity;Single extremity supported Standing balance-Leahy Scale: Fair Standing balance comment: able to stand with single UE support to perform pericare;BUE support required on RW                            ADL either performed or assessed with clinical judgement   ADL Overall ADL's : Needs assistance/impaired Eating/Feeding: Independent;Sitting   Grooming: Min guard;Standing   Upper Body Bathing: Set up;Sitting               Toilet Transfer: Min guard;Ambulation;RW Toilet Transfer Details (indicate cue type and reason): pt transferred from the bed to the bsc with min guard Toileting- Clothing Manipulation and Hygiene: Min guard;Sit to/from stand       Functional mobility during ADLs: Passenger transport manager     Praxis      Cognition Arousal/Alertness: Awake/alert Behavior During Therapy: WFL for tasks assessed/performed Overall Cognitive Status: Within Functional Limits for tasks assessed                                          Exercises     Shoulder Instructions       General Comments VSS     Pertinent Vitals/ Pain       Pain Assessment: No/denies pain  Home Living                                          Prior Functioning/Environment              Frequency  Min 2X/week  Progress Toward Goals  OT Goals(current goals can now be found in the care plan section)  Progress towards OT goals: Progressing toward goals  Acute Rehab OT Goals Patient Stated Goal: to go home OT Goal Formulation: With patient Time For Goal Achievement: 10/23/18 Potential to Achieve Goals: Good ADL Goals Pt Will Perform Grooming: with supervision;with set-up;with modified independence;standing Pt Will Perform Lower Body Bathing: with supervision;with modified independence;sit to/from stand Pt Will Perform Lower Body Dressing: with supervision;with modified independence;sit to/from stand Pt Will Transfer to Toilet: with supervision;with modified independence;ambulating;grab bars Pt Will Perform Toileting - Clothing Manipulation and hygiene: with modified independence;sit to/from stand Pt  Will Perform Tub/Shower Transfer: with supervision;with modified independence;ambulating;grab bars  Plan Discharge plan remains appropriate    Co-evaluation                 AM-PAC OT "6 Clicks" Daily Activity     Outcome Measure   Help from another person eating meals?: None Help from another person taking care of personal grooming?: A Little Help from another person toileting, which includes using toliet, bedpan, or urinal?: A Little Help from another person bathing (including washing, rinsing, drying)?: A Little Help from another person to put on and taking off regular upper body clothing?: A Little Help from another person to put on and taking off regular lower body clothing?: A Little 6 Click Score: 19    End of Session Equipment Utilized During Treatment: Gait belt;Rolling walker  OT Visit Diagnosis: Unsteadiness on feet (R26.81);Other abnormalities of gait and mobility (R26.89);Muscle weakness (generalized) (M62.81)   Activity Tolerance Patient tolerated treatment well   Patient Left in chair;with call bell/phone within reach;with chair alarm set   Nurse Communication Mobility status        Time: 6767-2094 OT Time Calculation (min): 10 min  Charges: OT General Charges $OT Visit: 1 Visit OT Treatments $Self Care/Home Management : 8-22 mins  Dorinda Hill OTR/L [place] Office: [place] 10/17/2018, 3:12 PM

## 2018-10-17 NOTE — TOC Initial Note (Signed)
Transition of Care [facility]) - Initial/Assessment Note    Patient Details  Name: [name] [name] MRN: [id number] Date of Birth: 01-Oct-1927  [facility] CM/SW Contact:    Benard Halsted, LCSW Phone Number: 10/17/2018, 12:46 PM  Clinical Narrative:                 CSW spoke with patient regarding home health PT recommendation. Patient is very pleasant and engaging. Patient reports that she lives home alone but she is planning on staying with her sister at discharge until possibly Sunday. Then she states her children will assist her at home. She stated she was unfamiliar with home health services but would accept [facility]. Their liaison has accepted the referral and CSW requested orders from MD. Patient declined equipment needs at this time. CSW confirmed PCP and address in system.   Expected Discharge Plan: [place] Barriers to Discharge: Continued Medical Work up   Patient Goals and CMS Choice Patient states their goals for this hospitalization and ongoing recovery are:: Return home CMS Medicare.gov Compare Post Acute Care list provided to:: Patient Choice offered to / list presented to : Patient  Expected Discharge Plan and Services Expected Discharge Plan: [place] In-house Referral: NA Discharge Planning Services: CM Consult Post Acute Care Choice: [place] arrangements for the past 2 months: Single Family Home                 DME Arranged: Patient refused services         HH Arranged: PT HH Agency: [facility] Date [place]: 10/17/18 Time [place]: [place] Representative spoke with at [place]: Dorian Pod  Prior Living Arrangements/Services Living arrangements for the past 2 months: [place] with:: Self Patient language and need for interpreter reviewed:: Yes Do you feel safe going back to the place where you live?: Yes      Need for Family Participation in Patient  Care: No (Comment) Care giver support system in place?: Yes (comment)   Criminal Activity/Legal Involvement Pertinent to Current Situation/Hospitalization: No - Comment as needed  Activities of Daily Living Home Assistive Devices/Equipment: Cane (specify quad or straight) ADL Screening (condition at time of admission) Patient's cognitive ability adequate to safely complete daily activities?: Yes Is the patient deaf or have difficulty hearing?: Yes Does the patient have difficulty seeing, even when wearing glasses/contacts?: No Does the patient have difficulty concentrating, remembering, or making decisions?: No Patient able to express need for assistance with ADLs?: Yes Does the patient have difficulty dressing or bathing?: No Independently performs ADLs?: No Communication: Independent Dressing (OT): Appropriate for developmental age Grooming: Independent Feeding: Independent Bathing: Needs assistance Is this a change from baseline?: Change from baseline, expected to last <3 days Toileting: Needs assistance Is this a change from baseline?: Change from baseline, expected to last <3 days In/Out Bed: Needs assistance Is this a change from baseline?: Change from baseline, expected to last <3 days Walks in Home: Needs assistance Is this a change from baseline?: Change from baseline, expected to last <3 days Does the patient have difficulty walking or climbing stairs?: Yes Weakness of Legs: Both Weakness of Arms/Hands: None  Permission Sought/Granted Permission sought to share information with : Facility Arts administrator granted to share info w AGENCY: [facility]        Emotional Assessment Appearance:: Appears stated age Attitude/Demeanor/Rapport: Charismatic,  Gracious, Engaged Affect (typically observed): Accepting, Appropriate, Pleasant Orientation: : Oriented to Self, Oriented to Place, Oriented to  Time, Oriented to Situation Alcohol / Substance Use:  Not Applicable Psych Involvement: No (comment)  Admission diagnosis:  Near syncope [R55] Urinary tract infection without hematuria, site unspecified [N39.0] Patient Active Problem List   Diagnosis Date Noted  . Syncope 10/15/2018  . Acute lower UTI 10/15/2018  . Right leg weakness 10/15/2018  . Pre-syncope 10/15/2018  . Corns and callus 09/25/2018  . Shingles 04/05/2018  . HLD (hyperlipidemia) 08/05/2017  . CKD (chronic kidney disease), stage III ([place]) 08/05/2017  . Numbness 11/19/2014  . Malignant neoplasm of thyroid gland ([place]) 03/28/2007  . HYPOTHYROIDISM, POSTSURGICAL 03/28/2007  . HYPOPARATHYROIDISM 03/28/2007  . ALLERGIC RHINITIS 11/02/2006  . OSTEOPENIA 11/02/2006   PCP:  Vivi Barrack, MD Pharmacy:   [facility] [address] [place]), [place] - [address] [place] ([place]) [place] [address] Phone: [phone] Fax: [phone]     Social Determinants of Health (SDOH) Interventions    Readmission Risk Interventions No flowsheet data found.

## 2018-10-18 DIAGNOSIS — I498 Other specified cardiac arrhythmias: Secondary | ICD-10-CM

## 2018-10-18 MED ORDER — CEFDINIR 300 MG PO CAPS: 300.0000 mg | ORAL_CAPSULE | Freq: Two times a day (BID) | ORAL | 0 refills | Status: DC

## 2018-10-18 MED ORDER — VALACYCLOVIR HCL 1 G PO TABS: 1000.0000 mg | ORAL_TABLET | Freq: Two times a day (BID) | ORAL | 0 refills | Status: AC

## 2018-10-18 MED ORDER — VERAPAMIL HCL ER 120 MG PO TBCR: 120.0000 mg | EXTENDED_RELEASE_TABLET | Freq: Every day | ORAL | 0 refills | Status: DC

## 2018-10-18 NOTE — Progress Notes (Signed)
Progress Note  Patient Name: [name] [name] Date of Encounter: 10/18/2018  Primary Cardiologist: Candee Furbish, MD   Subjective   Feels better. No further PAT or SVT  Inpatient Medications    Scheduled Meds:  calcitRIOL  0.25 mcg Oral Q T,Th,S,Su   levothyroxine  100 mcg Oral QAC breakfast   loratadine  10 mg Oral Daily   sodium chloride flush  3 mL Intravenous Once   valACYclovir  1,000 mg Oral BID   verapamil  120 mg Oral Daily   Continuous Infusions:  cefTRIAXone (ROCEPHIN)  IV 1 g (10/17/18 2142)   PRN Meds: acetaminophen **OR** acetaminophen, ondansetron **OR** ondansetron (ZOFRAN) IV   Vital Signs    Vitals:   10/17/18 1231 10/17/18 2206 10/18/18 0359 10/18/18 0528  BP: 128/78 139/65  (!) 160/87  Pulse: 87 (!) 42  73  Resp: 17     Temp: 97.9 F (36.6 C) 98 F (36.7 C)  (!) 97.4 F (36.3 C)  TempSrc: Oral     SpO2: 94% 94%  95%  Weight:   64 kg   Height:        Intake/Output Summary (Last 24 hours) at 10/18/2018 0928 Last data filed at 10/17/2018 1100 Gross per 24 hour  Intake 360 ml  Output --  Net 360 ml   Last 3 Weights 10/18/2018 10/16/2018 06/13/2018  Weight (lbs) 141 lb 141 lb 141 lb 8 oz  Weight (kg) 63.957 kg 63.957 kg 64.184 kg      Telemetry    PSVT yesterday. Now NSR.  - Personally Reviewed  ECG    NSR LVH TWI lateral - Personally Reviewed  Physical Exam   GEN: No acute distress.  Elderly  Neck: No JVD Cardiac: RRR, no murmurs, rubs, or gallops.  Respiratory: Clear to auscultation bilaterally. GI: Soft, nontender, non-distended  MS: No edema; No deformity. Neuro:  Nonfocal  Psych: Normal affect   Labs    High Sensitivity Troponin:   Recent Labs  Lab 10/15/18 2012 10/15/18 2212 10/16/18 0023 10/16/18 0303  TROPONINIHS 82* 84* 81* 67*      Cardiac EnzymesNo results for input(s): TROPONINI in the last 168 hours. No results for input(s): TROPIPOC in the last 168 hours.   Chemistry Recent Labs  Lab 10/15/18 1748  10/16/18 0303 10/17/18 0357  NA 139 136 139  K 3.8 3.8 3.7  CL 100 98 104  CO2 29 29 24   GLUCOSE 110* 185* 107*  BUN 15 14 14   CREATININE 1.00 1.02* 0.90  CALCIUM 7.8* 7.5* 7.2*  PROT  --  8.1 7.0  ALBUMIN  --  3.9 3.3*  AST  --  27 24  ALT  --  18 14  ALKPHOS  --  55 46  BILITOT  --  0.6 0.5  GFRNONAA 50* 49* 57*  GFRAA 58* 57* >60  ANIONGAP 10 9 11      Hematology Recent Labs  Lab 10/15/18 1748 10/16/18 0303 10/17/18 0357  WBC 8.1 10.1 7.3  RBC 4.46 4.76 4.42  HGB 12.6 13.3 12.5  HCT 39.9 42.2 38.8  MCV 89.5 88.7 87.8  MCH 28.3 27.9 28.3  MCHC 31.6 31.5 32.2  RDW 13.4 13.3 13.4  PLT 318 328 295    BNPNo results for input(s): BNP, PROBNP in the last 168 hours.   DDimer  Recent Labs  Lab 10/16/18 0553  DDIMER 1.65*     Radiology    Ct Angio Chest Pe W Or Wo Contrast  Result Date:  10/16/2018 CLINICAL DATA:  PE suspected, high pretest prob EXAM: CT ANGIOGRAPHY CHEST WITH CONTRAST TECHNIQUE: Multidetector CT imaging of the chest was performed using the standard protocol during bolus administration of intravenous contrast. Multiplanar CT image reconstructions and MIPs were obtained to evaluate the vascular anatomy. CONTRAST:  21mL OMNIPAQUE IOHEXOL 350 MG/ML SOLN COMPARISON:  Chest radiograph yesterday. FINDINGS: Cardiovascular: There are no filling defects within the pulmonary arteries to suggest pulmonary embolus. Aortic atherosclerosis and tortuosity. No periaortic stranding. Mild cardiomegaly. No pericardial effusion. Mediastinum/Nodes: No enlarged mediastinal or hilar lymph nodes. Small hiatal hernia. Esophagus otherwise decompressed. Prior thyroidectomy. Lungs/Pleura: Mild atelectasis in the paramediastinal right middle lobe and lingula. Heterogeneous pulmonary parenchyma suggesting small airways disease. No focal airspace disease to suggest pneumonia. No evidence of pulmonary edema. No pleural fluid. Trachea and mainstem bronchi are patent. 6 mm right lower lobe  subpleural pulmonary nodule, image 96 series 8. Perifissural 4 mm nodule superior segment left lower lobe image 39 series 8. Upper Abdomen: No acute abnormality. Musculoskeletal: There are no acute or suspicious osseous abnormalities. Mild for age degenerative change in the spine. Degenerative change of both shoulders. Review of the MIP images confirms the above findings. IMPRESSION: 1. Negative for pulmonary embolus. 2. Heterogeneous pulmonary parenchyma suggesting small airways disease. No focal airspace disease to suggest pneumonia. 3. Mild cardiomegaly.  Small hiatal hernia. 4. Small bilateral pulmonary nodules largest measuring 6 mm in the right lower lobe. Non-contrast chest CT at 3-6 months is recommended. If the nodules are stable at time of repeat CT, then future CT at 18-24 months (from today's scan) is considered optional for low-risk patients, but is recommended for high-risk patients. This recommendation follows the consensus statement: Guidelines for Management of Incidental Pulmonary Nodules Detected on CT Images: From the Fleischner Society 2017; Radiology 2017; 284:228-243. Aortic Atherosclerosis (ICD10-I70.0). Electronically Signed   By: Keith Rake M.D.   On: 10/16/2018 23:01   Vas US Carotid  Result Date: 10/16/2018 Carotid Arterial Duplex Study Indications:       Syncope. Risk Factors:      Hyperlipidemia. Comparison Study:  No prior study. Performing Technologist: Maudry Mayhew RDMS, RVT, RDCS  Examination Guidelines: A complete evaluation includes B-mode imaging, spectral Doppler, color Doppler, and power Doppler as needed of all accessible portions of each vessel. Bilateral testing is considered an integral part of a complete examination. Limited examinations for reoccurring indications may be performed as noted.  Right Carotid Findings: +----------+--------+--------+--------+--------------------------+--------+             PSV cm/s EDV cm/s Stenosis Describe                    Comments  +----------+--------+--------+--------+--------------------------+--------+  CCA Prox   53       8                                                      +----------+--------+--------+--------+--------------------------+--------+  CCA Distal 53       14                irregular and heterogenous           +----------+--------+--------+--------+--------------------------+--------+  ICA Prox   37       11                smooth and heterogenous              +----------+--------+--------+--------+--------------------------+--------+  ICA Distal 52       13                                                     +----------+--------+--------+--------+--------------------------+--------+  ECA        40       5                 heterogenous and irregular           +----------+--------+--------+--------+--------------------------+--------+ +----------+--------+-------+----------------+-------------------+             PSV cm/s EDV cms Describe         Arm Pressure (mmHG)  +----------+--------+-------+----------------+-------------------+  Subclavian 69               Multiphasic, WNL                      +----------+--------+-------+----------------+-------------------+ +---------+--------+--+--------+-+---------+  Vertebral PSV cm/s 23 EDV cm/s 4 Antegrade  +---------+--------+--+--------+-+---------+  Left Carotid Findings: +----------+-------+-------+--------+------------------------+-----------------+             PSV     EDV     Stenosis Describe                 Comments                       cm/s    cm/s                                                         +----------+-------+-------+--------+------------------------+-----------------+  CCA Prox   57      12                                        intimal                                                                          thickening         +----------+-------+-------+--------+------------------------+-----------------+  CCA Distal 49      14                smooth and heterogenous                     +----------+-------+-------+--------+------------------------+-----------------+  ICA Prox   30      11               smooth and heterogenous                     +----------+-------+-------+--------+------------------------+-----------------+  ICA Distal 48      14                                                           +----------+-------+-------+--------+------------------------+-----------------+  ECA        47                       irregular and                                                                    heterogenous                                +----------+-------+-------+--------+------------------------+-----------------+ +----------+--------+--------+----------------+-------------------+  Subclavian PSV cm/s EDV cm/s Describe         Arm Pressure (mmHG)  +----------+--------+--------+----------------+-------------------+             99                Multiphasic, WNL                      +----------+--------+--------+----------------+-------------------+ +---------+--------+--+--------+--+---------+  Vertebral PSV cm/s 26 EDV cm/s 10 Antegrade  +---------+--------+--+--------+--+---------+  Summary: Right Carotid: Velocities in the right ICA are consistent with a 1-39% stenosis. Left Carotid: Velocities in the left ICA are consistent with a 1-39% stenosis. Vertebrals:  Bilateral vertebral arteries demonstrate antegrade flow. Subclavians: Normal flow hemodynamics were seen in bilateral subclavian              arteries. *See table(s) above for measurements and observations.  Electronically signed by Curt Jews MD on 10/16/2018 at 3:15:42 PM.    Final    Vas [place] Lower Extremity Venous (dvt)  Result Date: 10/17/2018  Lower Venous Study Indications: Edema right lower extremity. Elevated d-Dimer  Risk Factors: Pre syncope. Comparison Study: No previous exam available for comparison. Performing Technologist: Toma Copier RVS  Examination Guidelines: A  complete evaluation includes B-mode imaging, spectral Doppler, color Doppler, and power Doppler as needed of all accessible portions of each vessel. Bilateral testing is considered an integral part of a complete examination. Limited examinations for reoccurring indications may be performed as noted.  +---------+---------------+---------+-----------+----------+-------+  RIGHT     Compressibility Phasicity Spontaneity Properties Summary  +---------+---------------+---------+-----------+----------+-------+  CFV       Full            Yes       Yes                             +---------+---------------+---------+-----------+----------+-------+  SFJ       Full                                                      +---------+---------------+---------+-----------+----------+-------+  FV Prox   Full            Yes       Yes                             +---------+---------------+---------+-----------+----------+-------+  FV Mid    Full                                                      +---------+---------------+---------+-----------+----------+-------+  FV Distal Full            Yes       Yes                             +---------+---------------+---------+-----------+----------+-------+  PFV       Full            Yes       Yes                             +---------+---------------+---------+-----------+----------+-------+  POP       Full            Yes       Yes                             +---------+---------------+---------+-----------+----------+-------+  PTV       Full                                                      +---------+---------------+---------+-----------+----------+-------+  PERO      Full                                                      +---------+---------------+---------+-----------+----------+-------+   +----+---------------+---------+-----------+----------+-------+  LEFT Compressibility Phasicity Spontaneity Properties Summary  +----+---------------+---------+-----------+----------+-------+  CFV   Full            Yes       Yes                             +----+---------------+---------+-----------+----------+-------+  SFJ  Full                                                      +----+---------------+---------+-----------+----------+-------+     Summary: Right: There is no evidence of deep vein thrombosis in the lower extremity. No cystic structure found in the popliteal fossa. Left: There is no evidence of a common femoral vein obstruction.  *See table(s) above for measurements and observations. Electronically signed by Servando Snare MD on 10/17/2018 at 5:07:52 PM.    Final     Cardiac Studies   ECHO 2020    1. The left ventricle has low normal systolic function, with an ejection fraction of 50-55%. The cavity size was normal. There is mildly increased left ventricular wall thickness. Left ventricular diastolic Doppler parameters are consistent with  impaired relaxation. Elevated left ventricular end-diastolic pressure No evidence of left ventricular regional wall motion abnormalities.  2. The right ventricle has normal systolic function. The cavity was normal. There is no increase in right ventricular wall thickness.  3. The aortic valve was not well visualized. Moderate sclerosis of the aortic valve. Aortic valve regurgitation was not assessed by color flow Doppler. Moderate aortic annular calcification noted.  4. The mitral valve is degenerative. Mild thickening  of the mitral valve leaflet. There is moderate mitral annular calcification present.  Patient Profile     86 y.o. female with PSVT/PAT occasional symptomatic, aortic sclerosis no stenosis  Assessment & Plan    PSVT/PAT (168bpm)  - started verapamil SR 120 yesterday. No further occurrences.  Continue with current dose. Hopefully once she reaches steady state, these will improve.   - discussed yesterday with Dr. Rayann Heman, showed strips. Believed to be PAT. Continue medication tx.   - Dopplers of carotid  - mild plaque  [facility] will sign off.   Medication Recommendations:  Verapamil  Other recommendations (labs, testing, etc):   Follow up as an outpatient:  Will set up.   For questions or updates, please contact [place] Please consult [email or web address] for contact info under        Signed, Candee Furbish, MD  10/18/2018, 9:28 AM

## 2018-10-18 NOTE — Progress Notes (Signed)
Spoke to pt daughter about discharge and transportation plans.

## 2018-10-18 NOTE — TOC Transition Note (Signed)
Transition of Care [facility]) - CM/SW Discharge Note   Patient Details  Name: [name] [name] MRN: [id number] Date of Birth: Jun 10, 1927  [facility] CM/SW Contact:  Benard Halsted, LCSW Phone Number: 10/18/2018, 4:05 PM   Clinical Narrative:    Patient accepted by [facility]. Patient able to discharge home today.    Final next level of care: [place] Barriers to Discharge: No Barriers Identified   Patient Goals and CMS Choice Patient states their goals for this hospitalization and ongoing recovery are:: Return home CMS Medicare.gov Compare Post Acute Care list provided to:: Patient Choice offered to / list presented to : Patient  Discharge Placement                    Patient and family notified of of transfer: 10/18/18  Discharge Plan and Services In-house Referral: NA Discharge Planning Services: CM Consult Post Acute Care Choice: Home Health          DME Arranged: Patient refused services         HH Arranged: PT [place] Agency: [facility] Date [place]: 10/17/18 Time [place]: 8032 Representative spoke with at [place]: [place] ([place]) Interventions     Readmission Risk Interventions No flowsheet data found.

## 2018-10-18 NOTE — Care Management Important Message (Signed)
Important Message  Patient Details  Name: [name] [name] MRN: [id number] Date of Birth: 1928-03-23   Medicare Important Message Given:  Yes     Orbie Pyo 10/18/2018, 2:55 PM

## 2018-10-18 NOTE — Progress Notes (Signed)
Nsg Discharge Note  Admit Date:  10/15/2018 Discharge date: 10/18/2018   [name] [name] [place] to be D/C'd Home per MD order.  AVS completed.  Copy for chart, and copy for patient signed, and dated. Patient/caregiver able to verbalize understanding.  Discharge Medication: Allergies as of 10/18/2018   No Known Allergies     Medication List    STOP taking these medications   triamcinolone cream 0.1 % Commonly known as: KENALOG     TAKE these medications   calcitRIOL 0.25 MCG capsule Commonly known as: ROCALTROL Take 1 capsule (0.25 mcg total) by mouth 4 (four) times a week.   calcium carbonate 600 MG Tabs tablet Commonly known as: OS-CAL Take 600 mg by mouth at bedtime. Notes to patient: 10/18/2018 bedtime    cefdinir 300 MG capsule Commonly known as: OMNICEF Take 1 capsule (300 mg total) by mouth 2 (two) times daily. Notes to patient: 10/18/2018 bedtime    cetirizine 10 MG tablet Commonly known as: ZYRTEC Take 10 mg by mouth daily as needed for allergies or rhinitis.   docusate sodium 100 MG capsule Commonly known as: COLACE Take 100 mg by mouth at bedtime. Notes to patient: 10/18/2018 bedtime    levothyroxine 100 MCG tablet Commonly known as: SYNTHROID Take 1 tablet (100 mcg total) by mouth daily before breakfast. Notes to patient: 10/19/2018   LUDENS COUGH DROPS MT Use as directed 1 drop in the mouth or throat as needed (dry throat).   multivitamins ther. w/minerals Tabs tablet Take 1 tablet by mouth daily. Notes to patient: 10/19/2018   polyethylene glycol powder 17 GM/SCOOP powder Commonly known as: GLYCOLAX/MIRALAX Take 4.25-8.5 g by mouth at bedtime. Notes to patient: 10/18/2018 bedtime    valACYclovir 1000 MG tablet Commonly known as: VALTREX Take 1 tablet (1,000 mg total) by mouth 2 (two) times daily for 5 days. What changed:   medication strength  how much to take  how to take this  when to take this  additional instructions Notes to patient:  10/18/2018 bedtime    verapamil 120 MG CR tablet Commonly known as: CALAN-SR Take 1 tablet (120 mg total) by mouth daily. Start taking on: October 19, 2018 Notes to patient: 10/19/2018   vitamin C 500 MG tablet Commonly known as: ASCORBIC ACID Take 1 tablet (500 mg total) by mouth daily. Notes to patient: 10/19/2018       Discharge Assessment: Vitals:   10/18/18 0528 10/18/18 1430  BP: (!) 160/87 (!) 141/64  Pulse: 73 80  Resp:  18  Temp: (!) 97.4 F (36.3 C) 97.6 F (36.4 C)  SpO2: 95% 96%   Skin clean, dry and intact without evidence of skin break down, no evidence of skin tears noted. IV catheter discontinued intact. Site without signs and symptoms of complications - no redness or edema noted at insertion site, patient denies c/o pain - only slight tenderness at site.  Dressing with slight pressure applied.  D/c Instructions-Education: Discharge instructions given to patient/family with verbalized understanding. D/c education completed with patient/family including follow up instructions, medication list, d/c activities limitations if indicated, with other d/c instructions as indicated by MD - patient able to verbalize understanding, all questions fully answered. Patient instructed to return to ED, call 911, or call MD for any changes in condition.  Patient escorted via [place], and D/C home via private auto.  Tresa Endo, RN 10/18/2018 5:29 PM

## 2018-10-18 NOTE — Progress Notes (Signed)
Physical Therapy Treatment Patient Details Name: [name] [name] MRN: [id number] DOB: February 24, 1928 Today's Date: 10/18/2018    History of Present Illness 85 yo female admitted to ED on 7/7 with headaches, near syncope, and generalized weakness. Pt with UTI, RLE weakness and swelling. Pt also with elevated troponins, now downtrending with echo results WNL. PMH includes CKD, HLD, thyroid cancer s/p R lobectomy, osteopenia, ORIF L radius 2015.    PT Comments    Pt is motivated to recover and requesting exercised to perform for LE strengthening. Pt ambulated in hall using RW for stability. Overall steady with RW and encouraged pt to continue using AD. Patient would benefit from continued skilled PT to maximize functional independence and safety with mobility. Will continue to follow acutely.    Follow Up Recommendations  Home health PT;Supervision for mobility/OOB     Equipment Recommendations  None recommended by PT    Recommendations for Other Services       Precautions / Restrictions Precautions Precautions: Fall Restrictions Weight Bearing Restrictions: No    Mobility  Bed Mobility               General bed mobility comments: pt in chair on arrival  Transfers Overall transfer level: Needs assistance Equipment used: None Transfers: Sit to/from Stand;Stand Pivot Transfers Sit to Stand: Min guard         General transfer comment: min guard for safety  Ambulation/Gait Ambulation/Gait assistance: Min guard;Min assist Gait Distance (Feet): 160 Feet Assistive device: None;Rolling walker (2 wheeled) Gait Pattern/deviations: Step-through pattern;Decreased stride length;Trunk flexed;Narrow base of support Gait velocity: decr   General Gait Details: Pt ambulated in room w/o AD. Noted pt reaching for support on window sill and bed rails. Before leaving room, transitioned patient to rolling walker. Pt required min guard for safey with RW and vc for technique. Overall much  steadier with RW. Pt reports her legs feel better now that she is walking.    Stairs             Wheelchair Mobility    Modified Rankin (Stroke Patients Only)       Balance Overall balance assessment: Needs assistance Sitting-balance support: No upper extremity supported;Feet supported Sitting balance-Leahy Scale: Good     Standing balance support: During functional activity;Single extremity supported Standing balance-Leahy Scale: Fair Standing balance comment: able to stand with single UE support to perform pericare;BUE support required on RW                            Cognition Arousal/Alertness: Awake/alert Behavior During Therapy: [facility] for tasks assessed/performed Overall Cognitive Status: Within Functional Limits for tasks assessed                                        Exercises General Exercises - Lower Extremity Ankle Circles/Pumps: AROM;Both;10 reps;Seated Long Arc Quad: AROM;Both;10 reps;Seated Heel Slides: AROM;Both;10 reps;Seated    General Comments        Pertinent Vitals/Pain Pain Assessment: No/denies pain    Home Living                      Prior Function            PT Goals (current goals can now be found in the care plan section) Acute Rehab PT Goals Patient Stated Goal: to go home PT  Goal Formulation: With patient Time For Goal Achievement: 10/30/18 Potential to Achieve Goals: Good Progress towards PT goals: Progressing toward goals    Frequency    Min 3X/week      PT Plan Current plan remains appropriate    Co-evaluation              AM-PAC PT "6 Clicks" Mobility   Outcome Measure  Help needed turning from your back to your side while in a flat bed without using bedrails?: A Little Help needed moving from lying on your back to sitting on the side of a flat bed without using bedrails?: A Little Help needed moving to and from a bed to a chair (including a wheelchair)?: A  Little Help needed standing up from a chair using your arms (e.g., wheelchair or bedside chair)?: A Little Help needed to walk in hospital room?: A Little Help needed climbing 3-5 steps with a railing? : A Little 6 Click Score: 18    End of Session Equipment Utilized During Treatment: Gait belt Activity Tolerance: Patient tolerated treatment well Patient left: in chair;with chair alarm set;with call bell/phone within reach Nurse Communication: Mobility status PT Visit Diagnosis: Other abnormalities of gait and mobility (R26.89);Difficulty in walking, not elsewhere classified (R26.2)     Time: 4239-5320 PT Time Calculation (min) (ACUTE ONLY): 19 min  Charges:  $Gait Training: 8-22 mins                     Benjiman Core, [place] Pager [phone] Acute Rehab   Allena Katz 10/18/2018, 12:59 PM

## 2018-10-18 NOTE — Discharge Summary (Signed)
Physician Discharge Summary  [name] [name] YHC:[id number] DOB: 1927/08/10 DOA: 10/15/2018  PCP: Vivi Barrack, MD  Admit date: 10/15/2018 Discharge date: 10/18/2018  Recommendations for Outpatient Follow-up:  1. Follow up with cardiology as directed. 2. Follow up with PCP in 7-10 days  Follow-up Information    [facility].   Specialty: Home Health Services Why: Home Health PT arranged.  Contact information: [address] [place] [address] [phone]          Discharge Diagnoses: Principal diagnosis is #1 1. Near syncope 2. Paroxysmal atrial tachycardia/NSVT 3. UTI 4. CKD 5. Elevated troponin 6. Right leg swelling ad weakness  Discharge Condition: Fair Disposition: Home with family  Diet recommendation: Heart healthy  Filed Weights   10/16/18 0139 10/18/18 0359  Weight: 64 kg 64 kg    History of present illness:  [name] [name] is a 85 y.o. female with medical history significant of thyroid cancer, hypoparathyroidism, CKD. The patient presented with intermittent episodes of lightheadedness feels like she is about a pass out cannot see very well everything goes black and she becomes disoriented she has had a number this episodes the past 3 days last episode was today. Currently she has had an headache she feels like she is about to pass out but never really did. On date of presentation the patient went to urgent care to get this evaluated. She had no chest pain or shortness of breath no palpitations. She did report that her right leg has been feeling weaker and swollen lately.   Hospital Course:  The patient was admitted to a telemetry bed. CTA dn MRI were negative for stroke. CTA of the chest was negative for PE. Urine culture had only insignificant growth. Blood cultures had no growth. Doppler of her right lower extremity was negative for DVT. While on telemetry the patient was seen to be in NSVT. Cardiology was consulted and the patient  was discussed with EP. The patient has been placed on verapamil 120 mg daily. Cardiology feels that it is quite likely that this arrhythmia was the cause of the patient's symptoms, and that verapamil will prevent further episodes. She will be discharged to home today. I have discussed these findings with the patient's daughter, [name].  Today's assessment: S: The patient is sitting up at bedside. No new complaints. O: Vitals:  Vitals:   10/18/18 0528 10/18/18 1430  BP: (!) 160/87 (!) 141/64  Pulse: 73 80  Resp:  18  Temp: (!) 97.4 F (36.3 C) 97.6 F (36.4 C)  SpO2: 95% 96%   Constitutional:   The patient is resting comfortably. No new complaints. Respiratory:   No increased work of breathing.   No wheezes, rales, or rhonchi.  No tactile fremitus. Cardiovascular:   Regular rate and rhythm  No LE extremity edema    Normal pedal pulses Abdomen:   Abdomen is soft, non-tender, non-distended.  No hernias, masses, or organomegaly is appreciated.  Normoactive bowel sounds. Musculoskeletal:   No cyanosis, clubbing, or edema. Skin:   No rashes, lesions, ulcers  palpation of skin: no induration or nodules Neurologic:   CN 2-12 intact  Sensation all 4 extremities intact Psychiatric:   Mental status ? Mood, affect appropriate ? Orientation to person, place, time   judgment and insight appear intact   Discharge Instructions  Discharge Instructions    Activity as tolerated - No restrictions   Complete by: As directed    Call MD for:  persistant nausea and vomiting   Complete by: As directed    Call MD for:  severe uncontrolled pain   Complete by: As directed    Diet - low sodium heart healthy   Complete by: As directed    Discharge instructions   Complete by: As directed    Follow up with Dr. Marlou Porch as directed. Follow up with PCP in 7-10 days.   Increase activity slowly   Complete by: As directed      Allergies as of 10/18/2018   No Known Allergies      Medication List    STOP taking these medications   triamcinolone cream 0.1 % Commonly known as: KENALOG     TAKE these medications   calcitRIOL 0.25 MCG capsule Commonly known as: ROCALTROL Take 1 capsule (0.25 mcg total) by mouth 4 (four) times a week.   calcium carbonate 600 MG Tabs tablet Commonly known as: OS-CAL Take 600 mg by mouth at bedtime. Notes to patient: 10/18/2018 bedtime    cefdinir 300 MG capsule Commonly known as: OMNICEF Take 1 capsule (300 mg total) by mouth 2 (two) times daily. Notes to patient: 10/18/2018 bedtime    cetirizine 10 MG tablet Commonly known as: ZYRTEC Take 10 mg by mouth daily as needed for allergies or rhinitis.   docusate sodium 100 MG capsule Commonly known as: COLACE Take 100 mg by mouth at bedtime. Notes to patient: 10/18/2018 bedtime    levothyroxine 100 MCG tablet Commonly known as: SYNTHROID Take 1 tablet (100 mcg total) by mouth daily before breakfast. Notes to patient: 10/19/2018   LUDENS COUGH DROPS MT Use as directed 1 drop in the mouth or throat as needed (dry throat).   multivitamins ther. w/minerals Tabs tablet Take 1 tablet by mouth daily. Notes to patient: 10/19/2018   polyethylene glycol powder 17 GM/SCOOP powder Commonly known as: GLYCOLAX/MIRALAX Take 4.25-8.5 g by mouth at bedtime. Notes to patient: 10/18/2018 bedtime    valACYclovir 1000 MG tablet Commonly known as: VALTREX Take 1 tablet (1,000 mg total) by mouth 2 (two) times daily for 5 days. What changed:   medication strength  how much to take  how to take this  when to take this  additional instructions Notes to patient: 10/18/2018 bedtime    verapamil 120 MG CR tablet Commonly known as: CALAN-SR Take 1 tablet (120 mg total) by mouth daily. Start taking on: October 19, 2018 Notes to patient: 10/19/2018   vitamin C 500 MG tablet Commonly known as: ASCORBIC ACID Take 1 tablet (500 mg total) by mouth daily. Notes to patient: 10/19/2018       No Known Allergies  The results of significant diagnostics from this hospitalization (including imaging, microbiology, ancillary and laboratory) are listed below for reference.    Significant Diagnostic Studies: Ct Head Wo Contrast  Result Date: 10/15/2018 CLINICAL DATA:  Headaches and near syncopal episode EXAM: CT HEAD WITHOUT CONTRAST TECHNIQUE: Contiguous axial images were obtained from the base of the skull through the vertex without intravenous contrast. COMPARISON:  None. FINDINGS: Brain: Scattered areas of decreased attenuation are noted in the deep white matter consistent with chronic white matter ischemic change. No findings to suggest acute hemorrhage, acute infarction or space-occupying mass lesion are seen. Vascular: No hyperdense vessel or unexpected calcification. Skull: Normal. Negative for fracture or focal lesion. Sinuses/Orbits: No acute finding. Other: None. IMPRESSION: Chronic white matter ischemic change without acute abnormality. Electronically Signed   By: Inez Catalina M.D.   On: 10/15/2018 20:29  Ct Angio Chest Pe W Or Wo Contrast  Result Date: 10/16/2018 CLINICAL DATA:  PE suspected, high pretest prob EXAM: CT ANGIOGRAPHY CHEST WITH CONTRAST TECHNIQUE: Multidetector CT imaging of the chest was performed using the standard protocol during bolus administration of intravenous contrast. Multiplanar CT image reconstructions and MIPs were obtained to evaluate the vascular anatomy. CONTRAST:  43mL OMNIPAQUE IOHEXOL 350 MG/ML SOLN COMPARISON:  Chest radiograph yesterday. FINDINGS: Cardiovascular: There are no filling defects within the pulmonary arteries to suggest pulmonary embolus. Aortic atherosclerosis and tortuosity. No periaortic stranding. Mild cardiomegaly. No pericardial effusion. Mediastinum/Nodes: No enlarged mediastinal or hilar lymph nodes. Small hiatal hernia. Esophagus otherwise decompressed. Prior thyroidectomy. Lungs/Pleura: Mild atelectasis in the paramediastinal  right middle lobe and lingula. Heterogeneous pulmonary parenchyma suggesting small airways disease. No focal airspace disease to suggest pneumonia. No evidence of pulmonary edema. No pleural fluid. Trachea and mainstem bronchi are patent. 6 mm right lower lobe subpleural pulmonary nodule, image 96 series 8. Perifissural 4 mm nodule superior segment left lower lobe image 39 series 8. Upper Abdomen: No acute abnormality. Musculoskeletal: There are no acute or suspicious osseous abnormalities. Mild for age degenerative change in the spine. Degenerative change of both shoulders. Review of the MIP images confirms the above findings. IMPRESSION: 1. Negative for pulmonary embolus. 2. Heterogeneous pulmonary parenchyma suggesting small airways disease. No focal airspace disease to suggest pneumonia. 3. Mild cardiomegaly.  Small hiatal hernia. 4. Small bilateral pulmonary nodules largest measuring 6 mm in the right lower lobe. Non-contrast chest CT at 3-6 months is recommended. If the nodules are stable at time of repeat CT, then future CT at 18-24 months (from today's scan) is considered optional for low-risk patients, but is recommended for high-risk patients. This recommendation follows the consensus statement: Guidelines for Management of Incidental Pulmonary Nodules Detected on CT Images: From the Fleischner Society 2017; Radiology 2017; 284:228-243. Aortic Atherosclerosis (ICD10-I70.0). Electronically Signed   By: Keith Rake M.D.   On: 10/16/2018 23:01   Mr Brain Wo Contrast  Result Date: 10/16/2018 CLINICAL DATA:  84 year old female with headache, near syncope, disoriented, focal neuro deficit. EXAM: MRI HEAD WITHOUT CONTRAST TECHNIQUE: Multiplanar, multiecho pulse sequences of the brain and surrounding structures were obtained without intravenous contrast. COMPARISON:  Head CT without contrast 10/15/2018. FINDINGS: Brain: No restricted diffusion to suggest acute infarction. No midline shift, mass effect,  evidence of mass lesion, ventriculomegaly, extra-axial collection or acute intracranial hemorrhage. Cervicomedullary junction and pituitary are within normal limits. Patchy and scattered cerebral white matter T2 and FLAIR hyperintensity. This is confluent areas, but overall mild to moderate for age. No cortical encephalomalacia. T2 heterogeneity in the deep gray matter nuclei, mostly the left basal ganglia at the site of a chronic lacunar infarct with perhaps mild hemosiderin. Comparatively mild T2 heterogeneity in the pons. Negative cerebellum. No other chronic cerebral blood products. Vascular: Major intracranial vascular flow voids are preserved. The left vertebral artery appears dominant. There is a degree of generalized intracranial artery tortuosity. Skull and upper cervical spine: Disc and endplate degeneration at C3-C4 with mild spinal stenosis. Visualized bone marrow signal is within normal limits. Sinuses/Orbits: Negative visible orbits. Trace paranasal sinus mucosal thickening. Other: Mastoids are clear. Visible internal auditory structures appear normal. Scalp and face soft tissues appear negative. IMPRESSION: 1.  No acute intracranial abnormality. 2. Moderate for age chronic small vessel disease. Electronically Signed   By: Genevie Ann M.D.   On: 10/16/2018 07:06   Dg Chest Port 1 View  Result Date: 10/15/2018 CLINICAL  DATA:  Near syncope with headache and weakness. EXAM: PORTABLE CHEST 1 VIEW COMPARISON:  01/14/2014 FINDINGS: Cardiac silhouette is mildly enlarged. No mediastinal or hilar masses. Lungs are hyperexpanded. There are prominent bronchovascular markings most evident in the lower lungs. No evidence of pneumonia or pulmonary edema. No pleural effusion or pneumothorax. Postsurgical changes at the neck base consistent with prior thyroid surgery. Skeletal structures are demineralized but grossly intact. IMPRESSION: 1. No acute cardiopulmonary disease. 2. Stable cardiomegaly and chronic lung  changes. Electronically Signed   By: Lajean Manes M.D.   On: 10/15/2018 20:56   Vas US Carotid  Result Date: 10/16/2018 Carotid Arterial Duplex Study Indications:       Syncope. Risk Factors:      Hyperlipidemia. Comparison Study:  No prior study. Performing Technologist: Maudry Mayhew RDMS, RVT, RDCS  Examination Guidelines: A complete evaluation includes B-mode imaging, spectral Doppler, color Doppler, and power Doppler as needed of all accessible portions of each vessel. Bilateral testing is considered an integral part of a complete examination. Limited examinations for reoccurring indications may be performed as noted.  Right Carotid Findings: +----------+--------+--------+--------+--------------------------+--------+             PSV cm/s EDV cm/s Stenosis Describe                   Comments  +----------+--------+--------+--------+--------------------------+--------+  CCA Prox   53       8                                                      +----------+--------+--------+--------+--------------------------+--------+  CCA Distal 53       14                irregular and heterogenous           +----------+--------+--------+--------+--------------------------+--------+  ICA Prox   37       11                smooth and heterogenous              +----------+--------+--------+--------+--------------------------+--------+  ICA Distal 52       13                                                     +----------+--------+--------+--------+--------------------------+--------+  ECA        40       5                 heterogenous and irregular           +----------+--------+--------+--------+--------------------------+--------+ +----------+--------+-------+----------------+-------------------+             PSV cm/s EDV cms Describe         Arm Pressure (mmHG)  +----------+--------+-------+----------------+-------------------+  Subclavian 69               Multiphasic, WNL                       +----------+--------+-------+----------------+-------------------+ +---------+--------+--+--------+-+---------+  Vertebral PSV cm/s 23 EDV cm/s 4 Antegrade  +---------+--------+--+--------+-+---------+  Left Carotid Findings: +----------+-------+-------+--------+------------------------+-----------------+             PSV  EDV     Stenosis Describe                 Comments                       cm/s    cm/s                                                         +----------+-------+-------+--------+------------------------+-----------------+  CCA Prox   57      12                                        intimal                                                                          thickening         +----------+-------+-------+--------+------------------------+-----------------+  CCA Distal 49      14               smooth and heterogenous                     +----------+-------+-------+--------+------------------------+-----------------+  ICA Prox   30      11               smooth and heterogenous                     +----------+-------+-------+--------+------------------------+-----------------+  ICA Distal 48      14                                                           +----------+-------+-------+--------+------------------------+-----------------+  ECA        47                       irregular and                                                                    heterogenous                                +----------+-------+-------+--------+------------------------+-----------------+ +----------+--------+--------+----------------+-------------------+  Subclavian PSV cm/s EDV cm/s Describe         Arm Pressure (mmHG)  +----------+--------+--------+----------------+-------------------+             99                Multiphasic, WNL                      +----------+--------+--------+----------------+-------------------+ +---------+--------+--+--------+--+---------+  Vertebral PSV cm/s 26 EDV  cm/s 10 Antegrade  +---------+--------+--+--------+--+---------+  Summary: Right Carotid: Velocities in the right ICA are consistent with a 1-39% stenosis. Left Carotid: Velocities in the left ICA are consistent with a 1-39% stenosis. Vertebrals:  Bilateral vertebral arteries demonstrate antegrade flow. Subclavians: Normal flow hemodynamics were seen in bilateral subclavian              arteries. *See table(s) above for measurements and observations.  Electronically signed by Curt Jews MD on 10/16/2018 at 3:15:42 PM.    Final    Vas [place] Lower Extremity Venous (dvt)  Result Date: 10/17/2018  Lower Venous Study Indications: Edema right lower extremity. Elevated d-Dimer  Risk Factors: Pre syncope. Comparison Study: No previous exam available for comparison. Performing Technologist: Toma Copier RVS  Examination Guidelines: A complete evaluation includes B-mode imaging, spectral Doppler, color Doppler, and power Doppler as needed of all accessible portions of each vessel. Bilateral testing is considered an integral part of a complete examination. Limited examinations for reoccurring indications may be performed as noted.  +---------+---------------+---------+-----------+----------+-------+  RIGHT     Compressibility Phasicity Spontaneity Properties Summary  +---------+---------------+---------+-----------+----------+-------+  CFV       Full            Yes       Yes                             +---------+---------------+---------+-----------+----------+-------+  SFJ       Full                                                      +---------+---------------+---------+-----------+----------+-------+  FV Prox   Full            Yes       Yes                             +---------+---------------+---------+-----------+----------+-------+  FV Mid    Full                                                      +---------+---------------+---------+-----------+----------+-------+  FV Distal Full            Yes       Yes                              +---------+---------------+---------+-----------+----------+-------+  PFV       Full            Yes       Yes                             +---------+---------------+---------+-----------+----------+-------+  POP       Full            Yes       Yes                             +---------+---------------+---------+-----------+----------+-------+  PTV  Full                                                      +---------+---------------+---------+-----------+----------+-------+  PERO      Full                                                      +---------+---------------+---------+-----------+----------+-------+   +----+---------------+---------+-----------+----------+-------+  LEFT Compressibility Phasicity Spontaneity Properties Summary  +----+---------------+---------+-----------+----------+-------+  CFV  Full            Yes       Yes                             +----+---------------+---------+-----------+----------+-------+  SFJ  Full                                                      +----+---------------+---------+-----------+----------+-------+     Summary: Right: There is no evidence of deep vein thrombosis in the lower extremity. No cystic structure found in the popliteal fossa. Left: There is no evidence of a common femoral vein obstruction.  *See table(s) above for measurements and observations. Electronically signed by Servando Snare MD on 10/17/2018 at 5:07:52 PM.    Final     Microbiology: Recent Results (from the past 240 hour(s))  SARS Coronavirus 2 (CEPHEID - Performed in [facility] hospital lab), Hosp Order     Status: None   Collection Time: 10/15/18 11:08 PM   Specimen: Nasopharyngeal Swab  Result Value Ref Range Status   SARS Coronavirus 2 NEGATIVE NEGATIVE Final    Comment: (NOTE) If result is NEGATIVE SARS-CoV-2 target nucleic acids are NOT DETECTED. The SARS-CoV-2 RNA is generally detectable in upper and lower  respiratory specimens during the acute phase of  infection. The lowest  concentration of SARS-CoV-2 viral copies this assay can detect is 250  copies / mL. A negative result does not preclude SARS-CoV-2 infection  and should not be used as the sole basis for treatment or other  patient management decisions.  A negative result may occur with  improper specimen collection / handling, submission of specimen other  than nasopharyngeal swab, presence of viral mutation(s) within the  areas targeted by this assay, and inadequate number of viral copies  (<250 copies / mL). A negative result must be combined with clinical  observations, patient history, and epidemiological information. If result is POSITIVE SARS-CoV-2 target nucleic acids are DETECTED. The SARS-CoV-2 RNA is generally detectable in upper and lower  respiratory specimens dur ing the acute phase of infection.  Positive  results are indicative of active infection with SARS-CoV-2.  Clinical  correlation with patient history and other diagnostic information is  necessary to determine patient infection status.  Positive results do  not rule out bacterial infection or co-infection with other viruses. If result is PRESUMPTIVE POSTIVE SARS-CoV-2 nucleic acids MAY BE PRESENT.   A presumptive positive result was obtained on the submitted specimen  and confirmed on repeat testing.  While 2019 novel coronavirus  (SARS-CoV-2) nucleic acids may be present in the submitted sample  additional confirmatory testing may be necessary for epidemiological  and / or clinical management purposes  to differentiate between  SARS-CoV-2 and other Sarbecovirus currently known to infect humans.  If clinically indicated additional testing with an alternate test  methodology [phone]) is advised. The SARS-CoV-2 RNA is generally  detectable in upper and lower respiratory sp ecimens during the acute  phase of infection. The expected result is Negative. Fact Sheet for Patients:   StrictlyIdeas.no Fact Sheet for Healthcare Providers: BankingDealers.co.za This test is not yet approved or cleared by the [place] FDA and has been authorized for detection and/or diagnosis of SARS-CoV-2 by FDA under an Emergency Use Authorization (EUA).  This EUA will remain in effect (meaning this test can be used) for the duration of the COVID-19 declaration under Section 564(b)(1) of the Act, 21 U.S.C. section 360bbb-3(b)(1), unless the authorization is terminated or revoked sooner. Performed at [facility], [place] [address], [place], [place] [address]   Urine Culture     Status: Abnormal   Collection Time: 10/16/18  2:24 AM   Specimen: Urine, Clean Catch  Result Value Ref Range Status   Specimen Description URINE, CLEAN CATCH  Final   Special Requests Normal  Final   Culture (A)  Final    <10,000 COLONIES/mL INSIGNIFICANT GROWTH Performed at [facility], [place] [address], [place], [place] [address]    Report Status 10/17/2018 FINAL  Final  Culture, blood (routine x 2)     Status: None (Preliminary result)   Collection Time: 10/16/18  9:19 PM   Specimen: BLOOD  Result Value Ref Range Status   Specimen Description BLOOD LEFT ANTECUBITAL  Final   Special Requests   Final    BOTTLES DRAWN AEROBIC AND ANAEROBIC Blood Culture adequate volume   Culture   Final    NO GROWTH 2 DAYS Performed at [facility], [place] [address], [place], [place] [address]    Report Status PENDING  Incomplete  Culture, blood (routine x 2)     Status: None (Preliminary result)   Collection Time: 10/16/18  9:19 PM   Specimen: BLOOD RIGHT HAND  Result Value Ref Range Status   Specimen Description BLOOD RIGHT HAND  Final   Special Requests   Final    BOTTLES DRAWN AEROBIC ONLY Blood Culture results may not be optimal due to an excessive volume of blood received in culture bottles   Culture   Final    NO GROWTH 2 DAYS Performed at [facility], [place] [address] [place], [place] [address]    Report Status PENDING  Incomplete     Labs: Basic Metabolic Panel: Recent Labs  Lab 10/15/18 1748 10/16/18 0303 10/17/18 0357  NA 139 136 139  K 3.8 3.8 3.7  CL 100 98 104  CO2 29 29 24   GLUCOSE 110* 185* 107*  BUN 15 14 14   CREATININE 1.00 1.02* 0.90  CALCIUM 7.8* 7.5* 7.2*  MG  --  2.0  --   PHOS  --  3.7  --    Liver Function Tests: Recent Labs  Lab 10/16/18 0303 10/17/18 0357  AST 27 24  ALT 18 14  ALKPHOS 55 46  BILITOT 0.6 0.5  PROT 8.1 7.0  ALBUMIN 3.9 3.3*   No results for input(s): LIPASE, AMYLASE in the last 168 hours. No results for input(s): AMMONIA in the last  168 hours. CBC: Recent Labs  Lab 10/15/18 1748 10/16/18 0303 10/17/18 0357  WBC 8.1 10.1 7.3  NEUTROABS  --   --  4.9  HGB 12.6 13.3 12.5  HCT 39.9 42.2 38.8  MCV 89.5 88.7 87.8  PLT 318 328 295   Cardiac Enzymes: No results for input(s): CKTOTAL, CKMB, CKMBINDEX, TROPONINI in the last 168 hours. BNP: BNP (last 3 results) No results for input(s): BNP in the last 8760 hours.  ProBNP (last 3 results) No results for input(s): PROBNP in the last 8760 hours.  CBG: Recent Labs  Lab 10/15/18 2058  GLUCAP 88    Active Problems:   Syncope   Acute lower UTI   Right leg weakness   Pre-syncope   Time coordinating discharge: 38 minutes.  Signed:        Gaynell Eggleton, DO [facility]  10/18/2018, 3:27 PM

## 2018-10-21 ENCOUNTER — Telehealth: Payer: Self-pay

## 2018-10-21 LAB — CULTURE, BLOOD (ROUTINE X 2)
Culture: NO GROWTH
Culture: NO GROWTH
Special Requests: ADEQUATE

## 2018-10-21 NOTE — Telephone Encounter (Signed)
LM for patient's daughter to return call for TCM.

## 2018-10-23 DIAGNOSIS — E785 Hyperlipidemia, unspecified: Secondary | ICD-10-CM | POA: Diagnosis not present

## 2018-10-23 DIAGNOSIS — M899 Disorder of bone, unspecified: Secondary | ICD-10-CM | POA: Diagnosis not present

## 2018-10-23 DIAGNOSIS — N390 Urinary tract infection, site not specified: Secondary | ICD-10-CM | POA: Diagnosis not present

## 2018-10-23 DIAGNOSIS — E209 Hypoparathyroidism, unspecified: Secondary | ICD-10-CM | POA: Diagnosis not present

## 2018-10-23 DIAGNOSIS — Z9181 History of falling: Secondary | ICD-10-CM | POA: Diagnosis not present

## 2018-10-23 DIAGNOSIS — Z792 Long term (current) use of antibiotics: Secondary | ICD-10-CM | POA: Diagnosis not present

## 2018-10-23 DIAGNOSIS — B029 Zoster without complications: Secondary | ICD-10-CM | POA: Diagnosis not present

## 2018-10-23 DIAGNOSIS — I129 Hypertensive chronic kidney disease with stage 1 through stage 4 chronic kidney disease, or unspecified chronic kidney disease: Secondary | ICD-10-CM | POA: Diagnosis not present

## 2018-10-23 DIAGNOSIS — I479 Paroxysmal tachycardia, unspecified: Secondary | ICD-10-CM | POA: Diagnosis not present

## 2018-10-23 DIAGNOSIS — J309 Allergic rhinitis, unspecified: Secondary | ICD-10-CM | POA: Diagnosis not present

## 2018-10-23 DIAGNOSIS — N183 Chronic kidney disease, stage 3 (moderate): Secondary | ICD-10-CM | POA: Diagnosis not present

## 2018-10-23 DIAGNOSIS — L84 Corns and callosities: Secondary | ICD-10-CM | POA: Diagnosis not present

## 2018-10-23 DIAGNOSIS — E890 Postprocedural hypothyroidism: Secondary | ICD-10-CM | POA: Diagnosis not present

## 2018-10-23 DIAGNOSIS — Z85850 Personal history of malignant neoplasm of thyroid: Secondary | ICD-10-CM | POA: Diagnosis not present

## 2018-11-04 ENCOUNTER — Telehealth: Payer: Self-pay | Admitting: Nurse Practitioner

## 2018-11-04 NOTE — Telephone Encounter (Signed)
New Message ° ° ° °Left message to confirm appt and answer covid questions  °

## 2018-11-04 NOTE — Progress Notes (Signed)
CARDIOLOGY OFFICE NOTE  Date:  11/05/2018    [name] [name] Date of Birth: 1927/09/17 Medical Record #[id number]  PCP:  Vivi Barrack, MD  Cardiologist:  [facility] ([facility])  Chief Complaint  Patient presents with  . Follow-up    Post hospital visit - Seen for Dr. Marlou Porch    History of Present Illness: [name] [name] is a 85 y.o. female who presents today for a post hospital visit. Seen for Dr. Marlou Porch.   She has a history of thyroid cancer, CKD and hypoparathyroidism.    She has had no prior cardiac hx and then presented to ER 10/15/18 with several episodes of sudden headache and feeling she would pass out.  No frank loss of consciousness.  She also had right leg weakness and swelling.  She had shingles with rash across buttock.  Also with an acute UTI. Noted SVT while on the monitor (11 second episode at a rate of 168) - CCB therapy was started after discussion with EP. Medical management felt to be most ideal treatment management.   The patient does not have symptoms concerning for COVID-19 infection (fever, chills, cough, or new shortness of breath).   Comes in today. Here alone. Daughter is downstairs. She is doing ok. No more spells of presyncope. No palpitations. Has had some chronic swelling - does not seem any worse. Does not use support stockings. BP is ok. No chest pain. Breathing is ok. She is hard of hearing. She finished her antibiotic for her UTI. Overall, she feels like she is doing ok.   Past Medical History:  Diagnosis Date  . ALLERGIC RHINITIS 11/02/2006  . CKD (chronic kidney disease), stage III ([place])   . HYPERLIPIDEMIA 11/04/2006  . HYPOTHYROIDISM, POSTSURGICAL 03/28/2007  . Malignant neoplasm of thyroid gland ([place]) 03/28/2007   5/07: righte lobectomy--path shows 3 cm follicular ca 9/48: completion left lobectomy, no more tumor seen 09/07: thyroglobulin 0.2 9ab neg) 11/07: i-131 rx, 101 [facility]ci 6/08: thyroglogulin 0.2 (ab neg) , hypothyroid body scan neg  (clinically tolerated hypothyroidism poorly) 06/09: tg undetectable (ab=1.5, normal) 12/10: tg undetectable (ab neg) 6/11: tg undectable (ab neg)  . OSTEOPENIA 11/02/2006    Past Surgical History:  Procedure Laterality Date  . ABDOMINAL HYSTERECTOMY    . CERVICAL POLYPECTOMY    . OPEN REDUCTION INTERNAL FIXATION (ORIF) DISTAL RADIAL FRACTURE Left 01/14/2014   Procedure: OPEN REDUCTION INTERNAL FIXATION DISTAL RADIAL FRACTURE;  Surgeon: Linna Hoff, MD;  Location: [facility];  Service: Orthopedics;  Laterality: Left;     Medications: Current Meds  Medication Sig  . calcitRIOL (ROCALTROL) 0.25 MCG capsule Take 1 capsule (0.25 mcg total) by mouth 4 (four) times a week.  . calcium carbonate (OS-CAL) 600 MG TABS Take 600 mg by mouth at bedtime.   . cetirizine (ZYRTEC) 10 MG tablet Take 10 mg by mouth daily as needed for allergies or rhinitis.   Marland Kitchen docusate sodium (COLACE) 100 MG capsule Take 100 mg by mouth at bedtime.   Marland Kitchen levothyroxine (SYNTHROID) 100 MCG tablet Take 1 tablet (100 mcg total) by mouth daily before breakfast.  . Menthol-Ascorbic Acid (LUDENS COUGH DROPS MT) Use as directed 1 drop in the mouth or throat as needed (dry throat).  . Multiple Vitamins-Minerals (MULTIVITAMINS THER. W/MINERALS) TABS Take 1 tablet by mouth daily.    . polyethylene glycol powder (GLYCOLAX/MIRALAX) 17 GM/SCOOP powder Take 4.25-8.5 g by mouth at bedtime.  . verapamil (CALAN-SR) 120 MG CR tablet Take 1 tablet (  120 mg total) by mouth daily.  . vitamin C (ASCORBIC ACID) 500 MG tablet Take 1 tablet (500 mg total) by mouth daily.     Allergies: No Known Allergies  Social History: The patient  reports that she has never smoked. She has never used smokeless tobacco.   Family History: The patient's family history includes Diabetes in her sister.   Review of Systems: Please see the history of present illness.   All other systems are reviewed and negative.   Physical Exam: VS:  BP 140/80   Pulse 74   Ht  5\' 4"  (1.626 m)   Wt 141 lb 6.4 oz (64.1 kg)   SpO2 94%   BMI 24.27 kg/m  .  BMI Body mass index is 24.27 kg/m.  Wt Readings from Last 3 Encounters:  11/05/18 141 lb 6.4 oz (64.1 kg)  10/18/18 141 lb (64 kg)  06/13/18 141 lb 8 oz (64.2 kg)    General: Elderly. Alert and in no acute distress.   HEENT: Normal. She is hard of hearing - has bilateral aids.  Neck: Supple, no JVD, carotid bruits, or masses noted.  Cardiac: Regular rate and rhythm. Soft outflow murmur.  Trace edema.  Respiratory:  Lungs are clear to auscultation bilaterally with normal work of breathing.  GI: Soft and nontender.  MS: No deformity or atrophy. Gait and ROM intact.  Skin: Warm and dry. Color is normal.  Neuro:  Strength and sensation are intact and no gross focal deficits noted.  Psych: Alert, appropriate and with normal affect.   LABORATORY DATA:  EKG:  EKG is ordered today. This demonstrates NSR - LVH - septal Q's. Unchanged.   Lab Results  Component Value Date   WBC 7.3 10/17/2018   HGB 12.5 10/17/2018   HCT 38.8 10/17/2018   PLT 295 10/17/2018   GLUCOSE 107 (H) 10/17/2018   CHOL 208 (HH) 11/06/2007   TRIG 72 11/06/2007   HDL 64.8 11/06/2007   LDLDIRECT 124.5 11/06/2007   ALT 14 10/17/2018   AST 24 10/17/2018   NA 139 10/17/2018   K 3.7 10/17/2018   CL 104 10/17/2018   CREATININE 0.90 10/17/2018   BUN 14 10/17/2018   CO2 24 10/17/2018   TSH 3.732 10/16/2018     BNP (last 3 results) No results for input(s): BNP in the last 8760 hours.  ProBNP (last 3 results) No results for input(s): PROBNP in the last 8760 hours.   Other Studies Reviewed Today:  Echo 10/16/18 IMPRESSIONS  1. The left ventricle has low normal systolic function, with an ejection fraction of 50-55%. The cavity size was normal. There is mildly increased left ventricular wall thickness. Left ventricular diastolic Doppler parameters are consistent with  impaired relaxation. Elevated left ventricular end-diastolic  pressure No evidence of left ventricular regional wall motion abnormalities. 2. The right ventricle has normal systolic function. The cavity was normal. There is no increase in right ventricular wall thickness. 3. The aortic valve was not well visualized. Moderate sclerosis of the aortic valve. Aortic valve regurgitation was not assessed by color flow Doppler. Moderate aortic annular calcification noted. 4. The mitral valve is degenerative. Mild thickening of the mitral valve leaflet. There is moderate mitral annular calcification present.   Assessment/Plan:  1. PSTV - the record notes that her strips were shown to Dr. Rayann Heman - she was apparently symptomatic with loss of vision - spontaneous recovery once back in NSR - noted to be unusual for a supraventricular tachycardia to cause frank  syncope however given her advanced age, usually low normal blood pressure, this could certainly be a possibility.  Minimal carotid artery plaque noted.  MRI brain was normal.  Given her advanced age - medical management was felt to be best. She is doing ok. No recurrence of symptoms reported. Would favor continued medical management.   2. UTI - has finished her antibiotic.   3. Shingles - seems resolved. Says she had one blister.   4. Right leg weakness/swelling - somewhat chronic - support stockings encouraged and to elevate her legs. Hopefully her CCB will not aggravate.   5. Aortic sclerosis with no stenosis - no significant AS noted on her echo.   6. COVID-19 Education: The signs and symptoms of COVID-19 were discussed with the patient and how to seek care for testing (follow up with PCP or arrange E-visit).  The importance of social distancing, staying at home, hand hygiene and wearing a mask when out in public were discussed today.  Current medicines are reviewed with the patient today.  The patient does not have concerns regarding medicines other than what has been noted above.  The following  changes have been made:  See above.  Labs/ tests ordered today include:    Orders Placed This Encounter  Procedures  . EKG 12-Lead     Disposition:   FU with me in 3 to 4 months.  Daughter [name] updated by phone.   Patient is agreeable to this plan and will call if any problems develop in the interim.   SignedTruitt Merle, NP  11/05/2018 9:57 AM  [facility] [address] [place], [place]  [address] Phone: [phone] Fax: [phone]

## 2018-11-05 ENCOUNTER — Encounter: Payer: Self-pay | Admitting: Nurse Practitioner

## 2018-11-05 ENCOUNTER — Other Ambulatory Visit: Payer: Self-pay

## 2018-11-05 ENCOUNTER — Ambulatory Visit (INDEPENDENT_AMBULATORY_CARE_PROVIDER_SITE_OTHER): Payer: PPO | Admitting: Nurse Practitioner

## 2018-11-05 VITALS — BP 140/80 | HR 74 | Ht 64.0 in | Wt 141.4 lb

## 2018-11-05 DIAGNOSIS — I471 Supraventricular tachycardia: Secondary | ICD-10-CM

## 2018-11-05 DIAGNOSIS — R002 Palpitations: Secondary | ICD-10-CM

## 2018-11-05 DIAGNOSIS — R55 Syncope and collapse: Secondary | ICD-10-CM | POA: Diagnosis not present

## 2018-11-05 NOTE — Patient Instructions (Addendum)
After Visit Summary:  We will be checking the following labs today - NONE   Medication Instructions:    Continue with your current medicines.    If you need a refill on your cardiac medications before your next appointment, please call your pharmacy.     Testing/Procedures To Be Arranged:  N/A  Follow-Up:   See me in 3 to 4 months.     At [facility], you and your health needs are our priority.  As part of our continuing mission to provide you with exceptional heart care, we have created designated Provider Care Teams.  These Care Teams include your primary Cardiologist (physician) and Advanced Practice Providers (APPs -  Physician Assistants and Nurse Practitioners) who all work together to provide you with the care you need, when you need it.  Special Instructions:  . Stay safe, stay home, wash your hands for at least 20 seconds and wear a mask when out in public.  . It was good to talk with you today.  . Knee high support stockings can help with your swelling - keep elevating your legs and restrict your salt.    Call the [place] office at [phone] if you have any questions, problems or concerns.

## 2018-11-14 DIAGNOSIS — Z792 Long term (current) use of antibiotics: Secondary | ICD-10-CM | POA: Diagnosis not present

## 2018-11-14 DIAGNOSIS — M899 Disorder of bone, unspecified: Secondary | ICD-10-CM | POA: Diagnosis not present

## 2018-11-14 DIAGNOSIS — L84 Corns and callosities: Secondary | ICD-10-CM | POA: Diagnosis not present

## 2018-11-14 DIAGNOSIS — Z9181 History of falling: Secondary | ICD-10-CM | POA: Diagnosis not present

## 2018-11-14 DIAGNOSIS — I129 Hypertensive chronic kidney disease with stage 1 through stage 4 chronic kidney disease, or unspecified chronic kidney disease: Secondary | ICD-10-CM | POA: Diagnosis not present

## 2018-11-14 DIAGNOSIS — Z85850 Personal history of malignant neoplasm of thyroid: Secondary | ICD-10-CM | POA: Diagnosis not present

## 2018-11-14 DIAGNOSIS — E890 Postprocedural hypothyroidism: Secondary | ICD-10-CM | POA: Diagnosis not present

## 2018-11-14 DIAGNOSIS — B029 Zoster without complications: Secondary | ICD-10-CM | POA: Diagnosis not present

## 2018-11-14 DIAGNOSIS — I479 Paroxysmal tachycardia, unspecified: Secondary | ICD-10-CM | POA: Diagnosis not present

## 2018-11-14 DIAGNOSIS — E785 Hyperlipidemia, unspecified: Secondary | ICD-10-CM | POA: Diagnosis not present

## 2018-11-14 DIAGNOSIS — E209 Hypoparathyroidism, unspecified: Secondary | ICD-10-CM | POA: Diagnosis not present

## 2018-11-14 DIAGNOSIS — N183 Chronic kidney disease, stage 3 (moderate): Secondary | ICD-10-CM | POA: Diagnosis not present

## 2018-11-14 DIAGNOSIS — N390 Urinary tract infection, site not specified: Secondary | ICD-10-CM | POA: Diagnosis not present

## 2018-11-14 DIAGNOSIS — J309 Allergic rhinitis, unspecified: Secondary | ICD-10-CM | POA: Diagnosis not present

## 2018-11-18 ENCOUNTER — Other Ambulatory Visit: Payer: Self-pay

## 2018-11-18 MED ORDER — VERAPAMIL HCL ER 120 MG PO TBCR: 120.0000 mg | EXTENDED_RELEASE_TABLET | Freq: Every day | ORAL | 1 refills | Status: DC

## 2018-11-21 ENCOUNTER — Telehealth: Payer: Self-pay | Admitting: Physical Therapy

## 2018-11-21 NOTE — Telephone Encounter (Signed)
Noted  

## 2018-11-21 NOTE — Telephone Encounter (Signed)
Copied from CRM #[id number]. Topic: General - Other >> Nov 21, 2018  9:13 AM Davis, Karen A wrote: Reason for CRM: Annette with [facility] called to inform Dr Parker that patient missed a PT visit last week but also was discharged by them since she have met all her goals. Please be informed 

## 2018-11-21 NOTE — Telephone Encounter (Signed)
FYI

## 2018-12-04 ENCOUNTER — Telehealth: Payer: Self-pay | Admitting: Nurse Practitioner

## 2018-12-04 NOTE — Telephone Encounter (Signed)
Pt c/o swelling: STAT is pt has developed SOB within 24 hours  1) How much weight have you gained and in what time span?  No weight gain  2) If swelling, where is the swelling located? Legs- it swells and go in and out like a balloon- her legs hurt  3) Are you currently taking a fluid pill?  No  4) Are you currently SOB? no  5) Do you have a log of your daily weights (if so, list)? no  6) Have you gained 3 pounds in a day or 5 pounds in a week? No  7) Have you traveled recently? no

## 2018-12-04 NOTE — Telephone Encounter (Signed)
S/w pt was holding verapamil till pt heard from office.  Pt thought this caused pt's swelling last night and pt's legs going in and out like balloons.  Pt's legs also hurt last night.  Pt ate a banquet tv dinner with chicken nuggets and mac and chz. Pt has no pain today or swelling and is advised to take verapamil.  Pt is compliant with compression hose and elevating legs.  Pt advised to watch salt intake and if pt has swelling again to call office.  Also confirmed upcoming appt with Cecille Rubin in Oct and pt stated say Hi to [place].  Will send to [place] to [place].

## 2018-12-04 NOTE — Telephone Encounter (Signed)
The Verapamil could cause some swelling but it is more likely she is getting too much salt from the TV dinners.  Needs to try and restrict salt - use compression stockings and elevate legs first.   Burtis Junes, RN, [facility] [address] [place], [place]  [address] [phone]

## 2019-01-15 ENCOUNTER — Other Ambulatory Visit: Payer: Self-pay | Admitting: Nurse Practitioner

## 2019-01-16 ENCOUNTER — Ambulatory Visit (INDEPENDENT_AMBULATORY_CARE_PROVIDER_SITE_OTHER): Payer: PPO | Admitting: Family Medicine

## 2019-01-16 ENCOUNTER — Encounter: Payer: Self-pay | Admitting: Family Medicine

## 2019-01-16 DIAGNOSIS — J3489 Other specified disorders of nose and nasal sinuses: Secondary | ICD-10-CM | POA: Diagnosis not present

## 2019-01-16 MED ORDER — AZELASTINE HCL 0.1 % NA SOLN: 2.0000 | Freq: Two times a day (BID) | NASAL | 12 refills | Status: DC

## 2019-01-16 MED ORDER — BENZONATATE 200 MG PO CAPS: 200.0000 mg | ORAL_CAPSULE | Freq: Two times a day (BID) | ORAL | 0 refills | Status: DC | PRN

## 2019-01-16 NOTE — Progress Notes (Signed)
   Chief Complaint:  [name] [name] is a 85 y.o. female who presents today for a virtual office visit with a chief complaint of sinus drainage.   Assessment/Plan:  Sinus Drainage Possibly mild URI versus allergies.  Start Tessalon for cough and Astelin nasal spray for congestion/drainage.  Does not need antibiotics at this point.  Encouraged good oral hydration.  Offered testing for COVID however patient declined.  Discussed reasons return to care.  Follow-up as needed.    Subjective:  HPI:  Sinus Drainage Started a few days ago. Associated with cough. Seems to be worse at night. Took some tessalon which helped a lot. No fevers or chills. No sick contacts.  Worsening. No other obvious aggravating or alleviating factors.     ROS: Per HPI  PMH: She reports that she has never smoked. She has never used smokeless tobacco. No history on file for alcohol and drug.      Objective/Observations  Physical Exam: Gen: NAD, resting comfortably Pulm: Normal work of breathing Neuro: Grossly normal, moves all extremities Psych: Normal affect and thought content  Virtual Visit via Video   I connected with [name] on 01/16/19 at  3:40 PM EDT by a video enabled telemedicine application and verified that I am speaking with the correct person using two identifiers. I discussed the limitations of evaluation and management by telemedicine and the availability of in person appointments. The patient expressed understanding and agreed to proceed.   Patient location: Home Provider location: [place] participating in the virtual visit: Myself and Patient     Algis Greenhouse. Jerline Pain, MD 01/16/2019 8:14 AM

## 2019-01-23 NOTE — Progress Notes (Signed)
CARDIOLOGY OFFICE NOTE  Date:  01/28/2019    [name] Date of Birth: [birth year] Medical Record [id number]  PCP:  Vivi Barrack, MD  Cardiologist:  Marisa Cyphers   Chief Complaint  Patient presents with  . Follow-up    Seen for Dr. Marlou Porch    History of Present Illness: [name] [name] is a 85 y.o. female who presents today for a 3 month check.  Seen for Dr. Marlou Porch.   She has a history of thyroid cancer, CKD and hypoparathyroidism.   She has had no prior cardiac hx and then presented to ER 10/15/18 with several episodes of sudden headache and feeling she would pass out. No frank loss of consciousness. She also had right leg weakness and swelling. She had shingles with rash across buttock. Also with an acute UTI. Noted SVT while on the monitor (11 second episode at a rate of 168) - CCB therapy was started after discussion with EP. Medical management felt to be most ideal treatment management.   I saw her back in late July here in the office - she was felt to be doing ok.   The patient does not have symptoms concerning for COVID-19 infection (fever, chills, cough, or new shortness of breath).   Comes in today. Here alone. Her daughter is downstairs. She has had some issues with coughing - being treated by PCP with tessalon and nose spray. Had a spell this morning - now feels ok. No palpitations noted. No spells "like I would pass out" since back in July. No chest pain. She has been tending her flowers. Overall, no real concerns. I did call her daughter [name] who had no concerns as well.     Past Medical History:  Diagnosis Date  . ALLERGIC RHINITIS 11/02/2006  . CKD (chronic kidney disease), stage III   . HYPERLIPIDEMIA 11/04/2006  . HYPOTHYROIDISM, POSTSURGICAL 03/28/2007  . Malignant neoplasm of thyroid gland ([place]) 03/28/2007   5/07: righte lobectomy--path shows 3 cm follicular ca [id number]: completion left lobectomy, no more tumor seen 09/07: thyroglobulin 0.2  9ab neg) 11/07: i-131 rx, 101 mci 6/08: thyroglogulin 0.2 (ab neg) , hypothyroid body scan neg (clinically tolerated hypothyroidism poorly) 06/09: tg undetectable (ab=1.5, normal) 12/10: tg undetectable (ab neg) 6/11: tg undectable (ab neg)  . OSTEOPENIA 11/02/2006    Past Surgical History:  Procedure Laterality Date  . ABDOMINAL HYSTERECTOMY    . CERVICAL POLYPECTOMY    . OPEN REDUCTION INTERNAL FIXATION (ORIF) DISTAL RADIAL FRACTURE Left 01/14/2014   Procedure: OPEN REDUCTION INTERNAL FIXATION DISTAL RADIAL FRACTURE;  Surgeon: Linna Hoff, MD;  Location: [facility];  Service: Orthopedics;  Laterality: Left;     Medications: Current Meds  Medication Sig  . azelastine (ASTELIN) 0.1 % nasal spray Place 2 sprays into both nostrils 2 (two) times daily.  . benzonatate (TESSALON) 200 MG capsule Take 1 capsule (200 mg total) by mouth 2 (two) times daily as needed for cough.  . calcitRIOL (ROCALTROL) 0.25 MCG capsule Take 1 capsule (0.25 mcg total) by mouth 4 (four) times a week.  . calcium carbonate (OS-CAL) 600 MG TABS Take 600 mg by mouth at bedtime.   . cetirizine (ZYRTEC) 10 MG tablet Take 10 mg by mouth daily as needed for allergies or rhinitis.   Marland Kitchen docusate sodium (COLACE) 100 MG capsule Take 100 mg by mouth at bedtime.   Marland Kitchen levothyroxine (SYNTHROID) 100 MCG tablet Take 1 tablet (100 mcg total) by mouth daily before breakfast.  .  Menthol-Ascorbic Acid (LUDENS COUGH DROPS MT) Use as directed 1 drop in the mouth or throat as needed (dry throat).  . Multiple Vitamins-Minerals (MULTIVITAMINS THER. W/MINERALS) TABS Take 1 tablet by mouth daily.    . polyethylene glycol powder (GLYCOLAX/MIRALAX) 17 GM/SCOOP powder Take 4.25-8.5 g by mouth at bedtime.  . verapamil (CALAN-SR) 120 MG CR tablet Take 1 tablet by mouth once daily  . vitamin C (ASCORBIC ACID) 500 MG tablet Take 1 tablet (500 mg total) by mouth daily.     Allergies: No Known Allergies  Social History: The patient  reports that she  has never smoked. She has never used smokeless tobacco.   Family History: The patient's family history includes Diabetes in her sister.   Review of Systems: Please see the history of present illness.   All other systems are reviewed and negative.   Physical Exam: VS:  BP (!) 146/82   Pulse 85   Ht 5' 3.5" (1.613 m)   Wt 142 lb 1.9 oz (64.5 kg)   SpO2 97%   BMI 24.78 kg/m  .  BMI Body mass index is 24.78 kg/m.  Wt Readings from Last 3 Encounters:  01/28/19 142 lb 1.9 oz (64.5 kg)  11/05/18 141 lb 6.4 oz (64.1 kg)  10/18/18 141 lb (64 kg)    General: Pleasant. Elderly. Alert and in no acute distress.   HEENT: Normal.  Neck: Supple, no JVD, carotid bruits, or masses noted.  Cardiac: Regular rate and rhythm. No murmurs, rubs, or gallops. No edema.  Respiratory:  Lungs are clear to auscultation bilaterally with normal work of breathing.  GI: Soft and nontender.  MS: No deformity or atrophy. Gait and ROM intact.  Skin: Warm and dry. Color is normal.  Neuro:  Strength and sensation are intact and no gross focal deficits noted.  Psych: Alert, appropriate and with normal affect.   LABORATORY DATA:  EKG:  EKG is not ordered today.  Lab Results  Component Value Date   WBC 7.3 10/17/2018   HGB 12.5 10/17/2018   HCT 38.8 10/17/2018   PLT 295 10/17/2018   GLUCOSE 107 (H) 10/17/2018   CHOL 208 (HH) 11/06/2007   TRIG 72 11/06/2007   HDL 64.8 11/06/2007   LDLDIRECT 124.5 11/06/2007   ALT 14 10/17/2018   AST 24 10/17/2018   NA 139 10/17/2018   K 3.7 10/17/2018   CL 104 10/17/2018   CREATININE 0.90 10/17/2018   BUN 14 10/17/2018   CO2 24 10/17/2018   TSH 3.732 10/16/2018     BNP (last 3 results) No results for input(s): BNP in the last 8760 hours.  ProBNP (last 3 results) No results for input(s): PROBNP in the last 8760 hours.   Other Studies Reviewed Today:  Echo 10/16/18 IMPRESSIONS  1. The left ventricle has low normal systolic function, with an ejection  fraction of 50-55%. The cavity size was normal. There is mildly increased left ventricular wall thickness. Left ventricular diastolic Doppler parameters are consistent with  impaired relaxation. Elevated left ventricular end-diastolic pressure No evidence of left ventricular regional wall motion abnormalities. 2. The right ventricle has normal systolic function. The cavity was normal. There is no increase in right ventricular wall thickness. 3. The aortic valve was not well visualized. Moderate sclerosis of the aortic valve. Aortic valve regurgitation was not assessed by color flow Doppler. Moderate aortic annular calcification noted. 4. The mitral valve is degenerative. Mild thickening of the mitral valve leaflet. There is moderate mitral annular calcification present.  Assessment/Plan:  1. PSVT - noted in the record that her strips were shown to Dr. Rayann Heman - she was apparently symptomatic with loss of vision - spontaneous recovery once she was back in NSR - noted to be unusual for a SVT to cause frank syncope however, given her advanced age, usually low normal BP, this was felt to have been at least a possibility. She had minimal carotid plaque and normal brain MRI.  She is managed medically with CCB therapy.   Continues to do well - no recurrence.   2. Sinus drainage - per PCP  3. Advanced age  105. Chronic lower extremity edema - not really endorsed.   5. Aortic valve sclerosis - no stenosis per echo.   6. COVID-19 Education: The signs and symptoms of COVID-19 were discussed with the patient and how to seek care for testing (follow up with PCP or arrange E-visit).  The importance of social distancing, staying at home, hand hygiene and wearing a mask when out in public were discussed today.  Current medicines are reviewed with the patient today.  The patient does not have concerns regarding medicines other than what has been noted above.  The following changes have been made:  See  above.  Labs/ tests ordered today include:   No orders of the defined types were placed in this encounter.    Disposition:   FU with me in 6 months.   Patient is agreeable to this plan and will call if any problems develop in the interim.   SignedTruitt Merle, NP  01/28/2019 10:15 AM  [facility] [address] [place], [place]  [address] Phone: [phone] Fax: [phone]

## 2019-01-28 ENCOUNTER — Other Ambulatory Visit: Payer: Self-pay

## 2019-01-28 ENCOUNTER — Ambulatory Visit (INDEPENDENT_AMBULATORY_CARE_PROVIDER_SITE_OTHER): Payer: PPO | Admitting: Nurse Practitioner

## 2019-01-28 ENCOUNTER — Encounter: Payer: Self-pay | Admitting: Nurse Practitioner

## 2019-01-28 VITALS — BP 146/82 | HR 85 | Ht 63.5 in | Wt 142.1 lb

## 2019-01-28 DIAGNOSIS — I471 Supraventricular tachycardia: Secondary | ICD-10-CM

## 2019-01-28 NOTE — Patient Instructions (Addendum)

## 2019-02-13 ENCOUNTER — Other Ambulatory Visit: Payer: Self-pay

## 2019-02-17 ENCOUNTER — Other Ambulatory Visit (INDEPENDENT_AMBULATORY_CARE_PROVIDER_SITE_OTHER): Payer: PPO

## 2019-02-17 ENCOUNTER — Ambulatory Visit (INDEPENDENT_AMBULATORY_CARE_PROVIDER_SITE_OTHER): Payer: PPO | Admitting: Endocrinology

## 2019-02-17 ENCOUNTER — Encounter: Payer: Self-pay | Admitting: Endocrinology

## 2019-02-17 ENCOUNTER — Other Ambulatory Visit: Payer: Self-pay

## 2019-02-17 VITALS — BP 150/80 | HR 94 | Ht 63.5 in | Wt 140.0 lb

## 2019-02-17 DIAGNOSIS — C73 Malignant neoplasm of thyroid gland: Secondary | ICD-10-CM | POA: Diagnosis not present

## 2019-02-17 DIAGNOSIS — E208 Other hypoparathyroidism: Secondary | ICD-10-CM

## 2019-02-17 DIAGNOSIS — E890 Postprocedural hypothyroidism: Secondary | ICD-10-CM

## 2019-02-17 LAB — TSH: TSH: 1.93 u[IU]/mL (ref 0.35–4.50)

## 2019-02-17 LAB — VITAMIN D 25 HYDROXY (VIT D DEFICIENCY, FRACTURES): VITD: 48.06 ng/mL (ref 30.00–100.00)

## 2019-02-17 LAB — T4, FREE: Free T4: 1.19 ng/dL (ref 0.60–1.60)

## 2019-02-17 NOTE — Progress Notes (Signed)
Subjective:    Patient ID: [name] [name], female    DOB: 1927-10-03, 85 y.o.   MRN: [id number]  HPI  Pt returns from f/u of Stage-2 follicular adenocarcinoma of the thyroid.    5/07:  right lobectomy--path shows 3 cm right lobe follicular ca (T2 N0 M0).   8/07: completion left lobectomy, no more tumor seen.   9/07  thyroglobulin 11.8 (ab neg).   11/07: RAI, 101 mCi 6/08 thyroglogulin 0.2 (ab neg) 6/08: hypothyroid body scan neg (she tolerated thyroid hormone withdrawal poorly).  6/09 tg undetectable (ab=1.5) 12/10: tg undetectable (ab neg) 6/11:  tg undetectable (ab neg) 6/12: tg undetectable (ab neg) 6/13: tg undetectable (ab neg) 6/15: tg undetectable (ab neg) 6/16: tg undetectable (ab neg).   8/17: tg=0.1 (ab neg).  2/19: tg undetectable (ab neg). 5/20: tg undetectable (ab neg). She denies any nodule at the neck.  Postsurgical hypothyroidism: she takes synthroid as rx'ed.  Postsurgical hypoparathyroidism: She has slight muscle cramps of the legs, but no assoc weight change.  Pt says she takes meds as rx'ed.  She does not know her non-rx vit-D dosage.   Past Medical History:  Diagnosis Date  . ALLERGIC RHINITIS 11/02/2006  . CKD (chronic kidney disease), stage III   . HYPERLIPIDEMIA 11/04/2006  . HYPOTHYROIDISM, POSTSURGICAL 03/28/2007  . Malignant neoplasm of thyroid gland ([place]) 03/28/2007   5/07: righte lobectomy--path shows 3 cm follicular ca 5/36: completion left lobectomy, no more tumor seen 09/07: thyroglobulin 0.2 9ab neg) 11/07: i-131 rx, 101 mci 6/08: thyroglogulin 0.2 (ab neg) , hypothyroid body scan neg (clinically tolerated hypothyroidism poorly) 06/09: tg undetectable (ab=1.5, normal) 12/10: tg undetectable (ab neg) 6/11: tg undectable (ab neg)  . OSTEOPENIA 11/02/2006    Past Surgical History:  Procedure Laterality Date  . ABDOMINAL HYSTERECTOMY    . CERVICAL POLYPECTOMY    . OPEN REDUCTION INTERNAL FIXATION (ORIF) DISTAL RADIAL FRACTURE Left 01/14/2014   Procedure: OPEN REDUCTION INTERNAL FIXATION DISTAL RADIAL FRACTURE;  Surgeon: Linna Hoff, MD;  Location: [facility];  Service: Orthopedics;  Laterality: Left;    Social History   Socioeconomic History  . Marital status: Widowed    Spouse name: Not on file  . Number of children: Not on file  . Years of education: Not on file  . Highest education level: Not on file  Occupational History  . Not on file  Social Needs  . Financial resource strain: Not on file  . Food insecurity    Worry: Not on file    Inability: Not on file  . Transportation needs    Medical: Not on file    Non-medical: Not on file  Tobacco Use  . Smoking status: Never Smoker  . Smokeless tobacco: Never Used  Substance and Sexual Activity  . Alcohol use: Not on file  . Drug use: Not on file  . Sexual activity: Not on file  Lifestyle  . Physical activity    Days per week: Not on file    Minutes per session: Not on file  . Stress: Not on file  Relationships  . Social Herbalist on phone: Not on file    Gets together: Not on file    Attends religious service: Not on file    Active member of club or organization: Not on file    Attends meetings of clubs or organizations: Not on file    Relationship status: Not on file  . Intimate partner violence    Fear  of current or ex partner: Not on file    Emotionally abused: Not on file    Physically abused: Not on file    Forced sexual activity: Not on file  Other Topics Concern  . Not on file  Social History Narrative   Doesn't drive    Current Outpatient Medications on File Prior to Visit  Medication Sig Dispense Refill  . azelastine (ASTELIN) 0.1 % nasal spray Place 2 sprays into both nostrils 2 (two) times daily. 30 mL 12  . benzonatate (TESSALON) 200 MG capsule Take 1 capsule (200 mg total) by mouth 2 (two) times daily as needed for cough. 30 capsule 0  . calcitRIOL (ROCALTROL) 0.25 MCG capsule Take 1 capsule (0.25 mcg total) by mouth 4 (four)  times a week. 18 capsule 11  . calcium carbonate (OS-CAL) 600 MG TABS Take 600 mg by mouth at bedtime.     . cetirizine (ZYRTEC) 10 MG tablet Take 10 mg by mouth daily as needed for allergies or rhinitis.     Marland Kitchen docusate sodium (COLACE) 100 MG capsule Take 100 mg by mouth at bedtime.     Marland Kitchen levothyroxine (SYNTHROID) 100 MCG tablet Take 1 tablet (100 mcg total) by mouth daily before breakfast. 90 tablet 3  . Menthol-Ascorbic Acid (LUDENS COUGH DROPS MT) Use as directed 1 drop in the mouth or throat as needed (dry throat).    . Multiple Vitamins-Minerals (MULTIVITAMINS THER. W/MINERALS) TABS Take 1 tablet by mouth daily.      . polyethylene glycol powder (GLYCOLAX/MIRALAX) 17 GM/SCOOP powder Take 4.25-8.5 g by mouth at bedtime.    . verapamil (CALAN-SR) 120 MG CR tablet Take 1 tablet by mouth once daily 90 tablet 2  . vitamin C (ASCORBIC ACID) 500 MG tablet Take 1 tablet (500 mg total) by mouth daily. 50 tablet 0   No current facility-administered medications on file prior to visit.     No Known Allergies  Family History  Problem Relation Age of Onset  . Diabetes Sister     BP (!) 150/80 (BP Location: Right Arm, Patient Position: Sitting, Cuff Size: Normal)   Pulse 94   Ht 5' 3.5" (1.613 m)   Wt 140 lb (63.5 kg)   SpO2 95%   BMI 24.41 kg/m    Review of Systems Denies neck pain and numbness.      Objective:   Physical Exam VITAL SIGNS:  See vs page GENERAL: no distress Neck: a healed scar is present.  I do not appreciate a nodule in the thyroid or elsewhere in the neck      Assessment & Plan:  Hypoparathyroidism: recheck today. SVT: in this setting, she needs normalization of calcium, with rocaltrol if necessary HTN: is noted today PTC: recheck today Hypothyroidism: recheck today  Patient Instructions  Your blood pressure is high today.  Please see your primary care provider soon, to have it rechecked blood tests are being requested for you today.  We'll contact you with  results.   Based on the results, we'll probably have to increase the calcitriol.  Please come back for a follow-up appointment in 2-3 weeks.

## 2019-02-17 NOTE — Patient Instructions (Addendum)
Your blood pressure is high today.  Please see your primary care provider soon, to have it rechecked blood tests are being requested for you today.  We'll contact you with results.   Based on the results, we'll probably have to increase the calcitriol.  Please come back for a follow-up appointment in 2-3 weeks.

## 2019-02-18 ENCOUNTER — Other Ambulatory Visit: Payer: Self-pay | Admitting: Endocrinology

## 2019-02-18 LAB — PTH, INTACT AND CALCIUM
Calcium: 8.4 mg/dL — ABNORMAL LOW (ref 8.6–10.4)
PTH: 13 pg/mL — ABNORMAL LOW (ref 14–64)

## 2019-02-18 LAB — THYROGLOBULIN LEVEL: Thyroglobulin: <0.1 ng/mL — ABNORMAL LOW

## 2019-02-18 LAB — THYROGLOBULIN ANTIBODY: Thyroglobulin Ab: <1 IU/mL (ref <=1)

## 2019-02-18 MED ORDER — CALCITRIOL 0.25 MCG PO CAPS: 0.2500 ug | ORAL_CAPSULE | Freq: Every day | ORAL | 11 refills | Status: DC

## 2019-02-28 ENCOUNTER — Telehealth: Payer: Self-pay | Admitting: Family Medicine

## 2019-02-28 NOTE — Telephone Encounter (Signed)
I called the patient to schedule AWV with [name], but there was no answer and no option to leave a message. If patient calls back, please schedule Medicare Wellness Visit at next available opening.  [name] [name] (Dee-Dee)

## 2019-03-03 ENCOUNTER — Encounter: Payer: Self-pay | Admitting: Endocrinology

## 2019-03-03 ENCOUNTER — Other Ambulatory Visit: Payer: Self-pay

## 2019-03-03 ENCOUNTER — Ambulatory Visit (INDEPENDENT_AMBULATORY_CARE_PROVIDER_SITE_OTHER): Payer: PPO | Admitting: Endocrinology

## 2019-03-03 VITALS — BP 186/68 | HR 103 | Ht 63.5 in | Wt 139.0 lb

## 2019-03-03 DIAGNOSIS — E890 Postprocedural hypothyroidism: Secondary | ICD-10-CM | POA: Diagnosis not present

## 2019-03-03 DIAGNOSIS — C73 Malignant neoplasm of thyroid gland: Secondary | ICD-10-CM | POA: Diagnosis not present

## 2019-03-03 DIAGNOSIS — E208 Other hypoparathyroidism: Secondary | ICD-10-CM | POA: Diagnosis not present

## 2019-03-03 LAB — BASIC METABOLIC PANEL
BUN: 19 mg/dL (ref 6–23)
CO2: 29 mEq/L (ref 19–32)
Calcium: 8.7 mg/dL (ref 8.4–10.5)
Chloride: 100 mEq/L (ref 96–112)
Creatinine, Ser: 0.88 mg/dL (ref 0.40–1.20)
GFR: 60.48 mL/min (ref >60.00)
Glucose, Bld: 91 mg/dL (ref 70–99)
Potassium: 3.9 mEq/L (ref 3.5–5.1)
Sodium: 138 mEq/L (ref 135–145)

## 2019-03-03 NOTE — Progress Notes (Signed)
Subjective:    Patient ID: [name] [name], female    DOB: 1928-01-02, 85 y.o.   MRN: [id number]  HPI Pt has Stage-2 FTC:    5/07:  right lobectomy--path shows 3 cm right lobe follicular ca (T2 N0 M0).   8/07: completion left lobectomy, no more tumor seen.   9/07  thyroglobulin 11.8 (ab neg).   11/07: RAI, 101 mCi 6/08 thyroglogulin 0.2 (ab neg) 6/08: hypothyroid body scan neg (she tolerated thyroid hormone withdrawal poorly).  6/09 tg undetectable (ab=1.5) 12/10: tg undetectable (ab neg) 6/11:  tg undetectable (ab neg) 6/12: tg undetectable (ab neg) 6/13: tg undetectable (ab neg) 6/15: tg undetectable (ab neg) 6/16: tg undetectable (ab neg).   8/17: tg=0.1 (ab neg).  2/19: tg undetectable (ab neg). 5/20: tg undetectable (ab neg).  She denies any nodule at the neck.  Postsurgical hypothyroidism: she takes synthroid as rx'ed.  Postsurgical hypoparathyroidism: denies muscle cramps.   Pt says she takes meds as rx'ed.   Past Medical History:  Diagnosis Date   ALLERGIC RHINITIS 11/02/2006   CKD (chronic kidney disease), stage III    HYPERLIPIDEMIA 11/04/2006   HYPOTHYROIDISM, POSTSURGICAL 03/28/2007   Malignant neoplasm of thyroid gland ([place]) 03/28/2007   5/07: righte lobectomy--path shows 3 cm follicular ca [id number]: completion left lobectomy, no more tumor seen 09/07: thyroglobulin 0.2 9ab neg) 11/07: i-131 rx, 101 mci 6/08: thyroglogulin 0.2 (ab neg) , hypothyroid body scan neg (clinically tolerated hypothyroidism poorly) 06/09: tg undetectable (ab=1.5, normal) 12/10: tg undetectable (ab neg) 6/11: tg undectable (ab neg)   OSTEOPENIA 11/02/2006    Past Surgical History:  Procedure Laterality Date   ABDOMINAL HYSTERECTOMY     CERVICAL POLYPECTOMY     OPEN REDUCTION INTERNAL FIXATION (ORIF) DISTAL RADIAL FRACTURE Left 01/14/2014   Procedure: OPEN REDUCTION INTERNAL FIXATION DISTAL RADIAL FRACTURE;  Surgeon: Linna Hoff, MD;  Location: [place];  Service: Orthopedics;   Laterality: Left;    Social History   Socioeconomic History   Marital status: Widowed    Spouse name: Not on file   Number of children: Not on file   Years of education: Not on file   Highest education level: Not on file  Occupational History   Not on file  Social Needs   Financial resource strain: Not on file   Food insecurity    Worry: Not on file    Inability: Not on file   Transportation needs    Medical: Not on file    Non-medical: Not on file  Tobacco Use   Smoking status: Never Smoker   Smokeless tobacco: Never Used  Substance and Sexual Activity   Alcohol use: Not on file   Drug use: Not on file   Sexual activity: Not on file  Lifestyle   Physical activity    Days per week: Not on file    Minutes per session: Not on file   Stress: Not on file  Relationships   Social connections    Talks on phone: Not on file    Gets together: Not on file    Attends religious service: Not on file    Active member of club or organization: Not on file    Attends meetings of clubs or organizations: Not on file    Relationship status: Not on file   Intimate partner violence    Fear of current or ex partner: Not on file    Emotionally abused: Not on file    Physically abused: Not on file  Forced sexual activity: Not on file  Other Topics Concern   Not on file  Social History Narrative   Doesn't drive    Current Outpatient Medications on File Prior to Visit  Medication Sig Dispense Refill   azelastine (ASTELIN) 0.1 % nasal spray Place 2 sprays into both nostrils 2 (two) times daily. 30 mL 12   benzonatate (TESSALON) 200 MG capsule Take 1 capsule (200 mg total) by mouth 2 (two) times daily as needed for cough. 30 capsule 0   calcitRIOL (ROCALTROL) 0.25 MCG capsule Take 1 capsule (0.25 mcg total) by mouth daily. 30 capsule 11   calcium carbonate (OS-CAL) 600 MG TABS Take 600 mg by mouth at bedtime.      cetirizine (ZYRTEC) 10 MG tablet Take 10 mg by  mouth daily as needed for allergies or rhinitis.      docusate sodium (COLACE) 100 MG capsule Take 100 mg by mouth at bedtime.      levothyroxine (SYNTHROID) 100 MCG tablet Take 1 tablet (100 mcg total) by mouth daily before breakfast. 90 tablet 3   Menthol-Ascorbic Acid (LUDENS COUGH DROPS MT) Use as directed 1 drop in the mouth or throat as needed (dry throat).     Multiple Vitamins-Minerals (MULTIVITAMINS THER. W/MINERALS) TABS Take 1 tablet by mouth daily.       polyethylene glycol powder (GLYCOLAX/MIRALAX) 17 GM/SCOOP powder Take 4.25-8.5 g by mouth at bedtime.     verapamil (CALAN-SR) 120 MG CR tablet Take 1 tablet by mouth once daily 90 tablet 2   vitamin C (ASCORBIC ACID) 500 MG tablet Take 1 tablet (500 mg total) by mouth daily. 50 tablet 0   No current facility-administered medications on file prior to visit.     No Known Allergies  Family History  Problem Relation Age of Onset   Diabetes Sister     BP (!) 186/68 (BP Location: Right Arm, Patient Position: Sitting, Cuff Size: Normal)    Pulse (!) 103    Ht 5' 3.5" (1.613 m)    Wt 139 lb (63 kg)    SpO2 96%    BMI 24.24 kg/m    Review of Systems Denies numbness.      Objective:   Physical Exam VITAL SIGNS:  See vs page GENERAL: no distress GAIT: normal and steady.    Lab Results  Component Value Date   TSH 1.93 02/17/2019   Lab Results  Component Value Date   PTH 13 (L) 02/17/2019   CALCIUM 8.7 03/03/2019   PHOS 3.7 10/16/2018       Assessment & Plan:  HTN: is noted today.  Hypoparathyroidism: well-controlled.   Patient Instructions  Your blood pressure is high today.  Please see your primary care provider soon, to have it rechecked blood tests are being requested for you today.  We'll contact you with results.   Please come back for a follow-up appointment in 3 months.

## 2019-03-03 NOTE — Patient Instructions (Addendum)
Your blood pressure is high today.  Please see your primary care provider soon, to have it rechecked blood tests are being requested for you today.  We'll contact you with results.   Please come back for a follow-up appointment in 3 months.

## 2019-04-24 ENCOUNTER — Other Ambulatory Visit: Payer: Self-pay | Admitting: Family Medicine

## 2019-04-24 ENCOUNTER — Telehealth: Payer: Self-pay

## 2019-04-24 NOTE — Telephone Encounter (Signed)
Patient requesting a refill for medication. Pt is unsure of the name of the meds but pt states that she needs pills for her shingles and infection cream. Pt use [facility] [place].

## 2019-04-24 NOTE — Telephone Encounter (Signed)
Please schedule patient a appt.

## 2019-04-25 ENCOUNTER — Encounter: Payer: Self-pay | Admitting: Family Medicine

## 2019-04-25 ENCOUNTER — Ambulatory Visit (INDEPENDENT_AMBULATORY_CARE_PROVIDER_SITE_OTHER): Payer: PPO | Admitting: Family Medicine

## 2019-04-25 DIAGNOSIS — B029 Zoster without complications: Secondary | ICD-10-CM

## 2019-04-25 MED ORDER — TRIAMCINOLONE ACETONIDE 0.5 % EX OINT: 1.0000 "application " | TOPICAL_OINTMENT | Freq: Two times a day (BID) | CUTANEOUS | 0 refills | Status: DC

## 2019-04-25 MED ORDER — VALACYCLOVIR HCL 1 G PO TABS: 1000.0000 mg | ORAL_TABLET | Freq: Every day | ORAL | 1 refills | Status: DC

## 2019-04-25 NOTE — Progress Notes (Signed)
   [name] [name] is a 86 y.o. female who presents today for a virtual office visit.  Assessment/Plan:  Chronic Problems Addressed Today: Shingles Acute flareup.  Start Valtrex 1000 mg daily x5 days.  Start topical triamcinolone as well.  ALLERGIC RHINITIS Stable.  Recommend she continue Astelin as needed.  Preventive healthcare Recommended patient get Covid vaccine as soon as possible.    Subjective:  HPI:  Patients had a flare of her shingles started a couple of days ago.  Has previously used Valtrex in the past which is worked well.  She tolerated Valtrex and topical triamcinolone the past well.       Objective/Observations  Physical Exam: Gen: NAD, resting comfortably Pulm: Normal work of breathing Neuro: Grossly normal, moves all extremities Psych: Normal affect and thought content  Virtual Visit via Video   I connected with [name] [name] on 04/25/19 at  3:40 PM EST by a video enabled telemedicine application and verified that I am speaking with the correct person using two identifiers. The limitations of evaluation and management by telemedicine and the availability of in person appointments were discussed. The patient expressed understanding and agreed to proceed.   Patient location: Home Provider location: [place] participating in the virtual visit: Myself and Patient     Algis Greenhouse. Jerline Pain, MD 04/25/2019 4:03 PM

## 2019-04-25 NOTE — Assessment & Plan Note (Signed)
Acute flareup.  Start Valtrex 1000 mg daily x5 days.  Start topical triamcinolone as well.

## 2019-04-25 NOTE — Assessment & Plan Note (Signed)
Stable.  Recommend she continue Astelin as needed.

## 2019-05-30 ENCOUNTER — Other Ambulatory Visit: Payer: Self-pay

## 2019-06-03 ENCOUNTER — Ambulatory Visit (INDEPENDENT_AMBULATORY_CARE_PROVIDER_SITE_OTHER): Payer: PPO | Admitting: Endocrinology

## 2019-06-03 ENCOUNTER — Encounter: Payer: Self-pay | Admitting: Endocrinology

## 2019-06-03 ENCOUNTER — Other Ambulatory Visit: Payer: Self-pay

## 2019-06-03 VITALS — BP 138/80 | HR 101 | Ht 63.5 in | Wt 143.0 lb

## 2019-06-03 DIAGNOSIS — E208 Other hypoparathyroidism: Secondary | ICD-10-CM

## 2019-06-03 DIAGNOSIS — C73 Malignant neoplasm of thyroid gland: Secondary | ICD-10-CM

## 2019-06-03 DIAGNOSIS — E890 Postprocedural hypothyroidism: Secondary | ICD-10-CM | POA: Diagnosis not present

## 2019-06-03 DIAGNOSIS — E2089 Other specified hypoparathyroidism: Secondary | ICD-10-CM

## 2019-06-03 LAB — BASIC METABOLIC PANEL
BUN: 24 mg/dL — ABNORMAL HIGH (ref 6–23)
CO2: 32 mEq/L (ref 19–32)
Calcium: 9 mg/dL (ref 8.4–10.5)
Chloride: 99 mEq/L (ref 96–112)
Creatinine, Ser: 0.96 mg/dL (ref 0.40–1.20)
GFR: 54.67 mL/min — ABNORMAL LOW (ref >60.00)
Glucose, Bld: 84 mg/dL (ref 70–99)
Potassium: 4.2 mEq/L (ref 3.5–5.1)
Sodium: 139 mEq/L (ref 135–145)

## 2019-06-03 LAB — VITAMIN D 25 HYDROXY (VIT D DEFICIENCY, FRACTURES): VITD: 39.05 ng/mL (ref 30.00–100.00)

## 2019-06-03 LAB — TSH: TSH: 9.13 u[IU]/mL — ABNORMAL HIGH (ref 0.35–4.50)

## 2019-06-03 NOTE — Progress Notes (Signed)
Subjective:    Patient ID: [name] [name], female    DOB: 07/23/1927, 86 y.o.   MRN: [id number]  HPI Pt returns for f/u of Stage-2 FTC:    5/07:  right lobectomy--path shows 3 cm right lobe follicular ca (T2 N0 M0).   8/07: completion left lobectomy, no more tumor seen.   9/07  thyroglobulin 11.8 (ab neg).   11/07: RAI, 101 mCi 6/08 thyroglogulin 0.2 (ab neg) 6/08: hypothyroid body scan neg (she tolerated thyroid hormone withdrawal poorly).  6/09 tg undetectable (ab=1.5) 12/10: tg undetectable (ab neg) 6/11:  tg undetectable (ab neg) 6/12: tg undetectable (ab neg) 6/13: tg undetectable (ab neg) 6/15: tg undetectable (ab neg) 6/16: tg undetectable (ab neg).   8/17: tg=0.1 (ab neg).  2/19: tg undetectable (ab neg). 5/20: tg undetectable (ab neg).  She denies any nodule at the neck.  Postsurgical hypothyroidism (due to SVT, she is not a candidate for TSH suppression): she takes synthroid as rx'ed.  Postsurgical hypoparathyroidism (she takes rocaltrol; due to SVT, goal Ca++ in normal range): denies muscle cramps.   Pt says she takes rocaltrol as rx'ed.  She takes vit-D at a uncertain dosage.   Past Medical History:  Diagnosis Date  . ALLERGIC RHINITIS 11/02/2006  . CKD (chronic kidney disease), stage III   . HYPERLIPIDEMIA 11/04/2006  . HYPOTHYROIDISM, POSTSURGICAL 03/28/2007  . Malignant neoplasm of thyroid gland ([place]) 03/28/2007   5/07: righte lobectomy--path shows 3 cm follicular ca [id number]: completion left lobectomy, no more tumor seen 09/07: thyroglobulin 0.2 9ab neg) 11/07: i-131 rx, 101 mci 6/08: thyroglogulin 0.2 (ab neg) , hypothyroid body scan neg (clinically tolerated hypothyroidism poorly) 06/09: tg undetectable (ab=1.5, normal) 12/10: tg undetectable (ab neg) 6/11: tg undectable (ab neg)  . OSTEOPENIA 11/02/2006    Past Surgical History:  Procedure Laterality Date  . ABDOMINAL HYSTERECTOMY    . CERVICAL POLYPECTOMY    . OPEN REDUCTION INTERNAL FIXATION (ORIF) DISTAL  RADIAL FRACTURE Left 01/14/2014   Procedure: OPEN REDUCTION INTERNAL FIXATION DISTAL RADIAL FRACTURE;  Surgeon: Linna Hoff, MD;  Location: [facility];  Service: Orthopedics;  Laterality: Left;    Social History   Socioeconomic History  . Marital status: Widowed    Spouse name: Not on file  . Number of children: Not on file  . Years of education: Not on file  . Highest education level: Not on file  Occupational History  . Not on file  Tobacco Use  . Smoking status: Never Smoker  . Smokeless tobacco: Never Used  Substance and Sexual Activity  . Alcohol use: Not on file  . Drug use: Not on file  . Sexual activity: Not on file  Other Topics Concern  . Not on file  Social History Narrative   Doesn't drive   Social Determinants of Health   Financial Resource Strain:   . Difficulty of Paying Living Expenses: Not on file  Food Insecurity:   . Worried About Charity fundraiser in the Last Year: Not on file  . Ran Out of Food in the Last Year: Not on file  Transportation Needs:   . Lack of Transportation (Medical): Not on file  . Lack of Transportation (Non-Medical): Not on file  Physical Activity:   . Days of Exercise per Week: Not on file  . Minutes of Exercise per Session: Not on file  Stress:   . Feeling of Stress : Not on file  Social Connections:   . Frequency of Communication with Friends and  Family: Not on file  . Frequency of Social Gatherings with Friends and Family: Not on file  . Attends Religious Services: Not on file  . Active Member of Clubs or Organizations: Not on file  . Attends Archivist Meetings: Not on file  . Marital Status: Not on file  Intimate Partner Violence:   . Fear of Current or Ex-Partner: Not on file  . Emotionally Abused: Not on file  . Physically Abused: Not on file  . Sexually Abused: Not on file    Current Outpatient Medications on File Prior to Visit  Medication Sig Dispense Refill  . azelastine (ASTELIN) 0.1 % nasal spray  Place 2 sprays into both nostrils 2 (two) times daily. 30 mL 12  . benzonatate (TESSALON) 200 MG capsule Take 1 capsule (200 mg total) by mouth 2 (two) times daily as needed for cough. 30 capsule 0  . calcitRIOL (ROCALTROL) 0.25 MCG capsule Take 1 capsule (0.25 mcg total) by mouth daily. 30 capsule 11  . calcium carbonate (OS-CAL) 600 MG TABS Take 600 mg by mouth at bedtime.     . cetirizine (ZYRTEC) 10 MG tablet Take 10 mg by mouth daily as needed for allergies or rhinitis.     Marland Kitchen docusate sodium (COLACE) 100 MG capsule Take 100 mg by mouth at bedtime.     . Menthol-Ascorbic Acid (LUDENS COUGH DROPS MT) Use as directed 1 drop in the mouth or throat as needed (dry throat).    . Multiple Vitamins-Minerals (MULTIVITAMINS THER. W/MINERALS) TABS Take 1 tablet by mouth daily.      . polyethylene glycol powder (GLYCOLAX/MIRALAX) 17 GM/SCOOP powder Take 4.25-8.5 g by mouth at bedtime.    . triamcinolone ointment (KENALOG) 0.5 % Apply 1 application topically 2 (two) times daily. 30 g 0  . valACYclovir (VALTREX) 1000 MG tablet Take 1 tablet (1,000 mg total) by mouth daily. Take 1 pill daily for 5 days for outbreaks. 30 tablet 1  . verapamil (CALAN-SR) 120 MG CR tablet Take 1 tablet by mouth once daily 90 tablet 2  . vitamin C (ASCORBIC ACID) 500 MG tablet Take 1 tablet (500 mg total) by mouth daily. 50 tablet 0   No current facility-administered medications on file prior to visit.    No Known Allergies  Family History  Problem Relation Age of Onset  . Diabetes Sister     BP 138/80 (BP Location: Left Arm, Patient Position: Sitting, Cuff Size: Normal)   Pulse (!) 101   Ht 5' 3.5" (1.613 m)   Wt 143 lb (64.9 kg)   SpO2 96%   BMI 24.93 kg/m    Review of Systems Denies cramps.    Objective:   Physical Exam VITAL SIGNS:  See vs page GENERAL: no distress Neck: a healed scar is present.  I do not appreciate a nodule in the thyroid or elsewhere in the neck.    Lab Results  Component Value  Date   PTH 13 (L) 02/17/2019   CALCIUM 9.0 06/03/2019   PHOS 3.7 10/16/2018   Lab Results  Component Value Date   TSH 9.13 (H) 06/03/2019      Assessment & Plan:  Hypothyroidism: she needs increased rx.  I have sent a prescription to your pharmacy, to increase synthroid FTC: recheck today. SVT: in this setting, she is not a candidate for suppressive synthroid dosing Hypoparathyroidism: well-controlled.  Please continue the same medications  Patient Instructions  blood tests are being requested for you today.  We'll contact  you with results.   Please come back for a follow-up appointment in 3 months.

## 2019-06-03 NOTE — Patient Instructions (Addendum)
blood tests are being requested for you today.  We'll contact you with results.   Please come back for a follow-up appointment in 3 months.

## 2019-06-04 ENCOUNTER — Telehealth: Payer: Self-pay | Admitting: Family Medicine

## 2019-06-04 LAB — THYROGLOBULIN ANTIBODY: Thyroglobulin Ab: <1 IU/mL (ref <=1)

## 2019-06-04 LAB — THYROGLOBULIN LEVEL: Thyroglobulin: <0.1 ng/mL — ABNORMAL LOW

## 2019-06-04 MED ORDER — LEVOTHYROXINE SODIUM 112 MCG PO TABS: 112.0000 ug | ORAL_TABLET | Freq: Every day | ORAL | 3 refills | Status: DC

## 2019-06-04 NOTE — Telephone Encounter (Signed)
I spoke with the patient's daughter to schedule AWV-Initial with [name].  She said that she is going out of town and will call back to schedule it when she returns.

## 2019-06-10 ENCOUNTER — Telehealth: Payer: Self-pay | Admitting: Endocrinology

## 2019-06-10 NOTE — Telephone Encounter (Signed)
Patient calling to ask if it is safe for her to get a COVID vaccine. Ph# [phone]

## 2019-06-10 NOTE — Telephone Encounter (Signed)
Please advise if there are any contraindications to this pt having vaccine

## 2019-06-10 NOTE — Telephone Encounter (Signed)
Called pt and informed about Dr. Ellison's response below. Verbalized acceptance and understanding. 

## 2019-06-10 NOTE — Telephone Encounter (Signed)
No problem.

## 2019-06-16 ENCOUNTER — Telehealth: Payer: Self-pay | Admitting: Nurse Practitioner

## 2019-06-16 ENCOUNTER — Other Ambulatory Visit: Payer: Self-pay | Admitting: Nurse Practitioner

## 2019-06-16 ENCOUNTER — Other Ambulatory Visit: Payer: Self-pay

## 2019-06-16 ENCOUNTER — Telehealth: Payer: Self-pay | Admitting: Endocrinology

## 2019-06-16 DIAGNOSIS — E890 Postprocedural hypothyroidism: Secondary | ICD-10-CM

## 2019-06-16 MED ORDER — LEVOTHYROXINE SODIUM 112 MCG PO TABS: 112.0000 ug | ORAL_TABLET | Freq: Every day | ORAL | 3 refills | Status: DC

## 2019-06-16 MED ORDER — VERAPAMIL HCL ER 120 MG PO TBCR: 120.0000 mg | EXTENDED_RELEASE_TABLET | Freq: Every day | ORAL | 1 refills | Status: DC

## 2019-06-16 NOTE — Telephone Encounter (Signed)
Patient called and stated that she has lost her prescription for Synthroid and she is asking for a refill sent to  [facility] ([facility]), [place] [address] Phone:  [phone]  Fax:  [phone]      Patient ph# [phone]

## 2019-06-16 NOTE — Telephone Encounter (Signed)
error 

## 2019-06-16 NOTE — Telephone Encounter (Signed)
Outpatient Medication Detail   Disp Refills Start End   levothyroxine (SYNTHROID) 112 MCG tablet 90 tablet 3 06/16/2019    Sig - Route: Take 1 tablet (112 mcg total) by mouth daily before breakfast. PATIENT STATES SHE LOST HER RX. ASKING FOR REFILL - Oral   Sent to pharmacy as: levothyroxine (SYNTHROID) 112 MCG tablet   E-Prescribing Status: Receipt confirmed by pharmacy (06/16/2019 11:37 AM EST)

## 2019-06-16 NOTE — Telephone Encounter (Signed)
  *  STAT* If patient is at the pharmacy, call can be transferred to refill team.   1. Which medications need to be refilled? (please list name of each medication and dose if known) verapamil (CALAN-SR) 120 MG CR tablet  2. Which pharmacy/location (including street and city if local pharmacy) is medication to be sent to? [facility] ([facility]), [place] [address]  3. Do they need a 30 day or 90 day supply? 90 days  Pt is completely out of this medication

## 2019-07-03 ENCOUNTER — Encounter: Payer: Self-pay | Admitting: Family Medicine

## 2019-07-03 ENCOUNTER — Telehealth (INDEPENDENT_AMBULATORY_CARE_PROVIDER_SITE_OTHER): Payer: PPO | Admitting: Family Medicine

## 2019-07-03 VITALS — Temp 97.5°F

## 2019-07-03 DIAGNOSIS — J309 Allergic rhinitis, unspecified: Secondary | ICD-10-CM | POA: Diagnosis not present

## 2019-07-03 DIAGNOSIS — J329 Chronic sinusitis, unspecified: Secondary | ICD-10-CM | POA: Diagnosis not present

## 2019-07-03 MED ORDER — AZITHROMYCIN 250 MG PO TABS: ORAL_TABLET | ORAL | 0 refills | Status: DC

## 2019-07-03 MED ORDER — BENZONATATE 200 MG PO CAPS: 200.0000 mg | ORAL_CAPSULE | Freq: Two times a day (BID) | ORAL | 0 refills | Status: DC | PRN

## 2019-07-03 NOTE — Assessment & Plan Note (Addendum)
Worsened.  Continue Astelin.  Will send in Tessalon as needed for cough.  She will also continue taking Zyrtec 10 mg daily.  Concern for possible sinusitis-we will send in "pocket prescription" with strict instruction to not start blood symptoms do not improve in the next several days.

## 2019-07-03 NOTE — Progress Notes (Signed)
   [name] [name] is a 86 y.o. female who presents today for a virtual office visit.  Assessment/Plan:  Chronic Problems Addressed Today: Allergic rhinitis Worsened.  Continue Astelin.  Will send in Tessalon as needed for cough.  She will also continue taking Zyrtec 10 mg daily.  Concern for possible sinusitis-we will send in "pocket prescription" with strict instruction to not start blood symptoms do not improve in the next several days.     Subjective:  HPI:  Symptoms started about 3 days ago. Symptoms include rhinorrhea, cough, and sputum production. Worse in the evening. Has tried taking astelin which helps some. No fevers or chills. Has had a few coughing fits.        Objective/Observations  Physical Exam: Gen: NAD, resting comfortably Pulm: Normal work of breathing Neuro: Grossly normal, moves all extremities Psych: Normal affect and thought content  Virtual Visit via Video   I connected with [name] [name] on 07/03/19 at 11:40 AM EDT by a video enabled telemedicine application and verified that I am speaking with the correct person using two identifiers. The limitations of evaluation and management by telemedicine and the availability of in person appointments were discussed. The patient expressed understanding and agreed to proceed.   Patient location: Home Provider location: [place] participating in the virtual visit: Myself and Patient     Algis Greenhouse. Jerline Pain, MD 07/03/2019 12:20 PM

## 2019-07-17 ENCOUNTER — Other Ambulatory Visit: Payer: Self-pay

## 2019-07-17 ENCOUNTER — Ambulatory Visit (INDEPENDENT_AMBULATORY_CARE_PROVIDER_SITE_OTHER): Payer: PPO | Admitting: Family Medicine

## 2019-07-17 ENCOUNTER — Encounter: Payer: Self-pay | Admitting: Family Medicine

## 2019-07-17 VITALS — BP 170/76 | HR 72 | Temp 98.0°F | Ht 63.5 in | Wt 143.4 lb

## 2019-07-17 DIAGNOSIS — J329 Chronic sinusitis, unspecified: Secondary | ICD-10-CM | POA: Diagnosis not present

## 2019-07-17 MED ORDER — AZELASTINE HCL 0.1 % NA SOLN: 2.0000 | Freq: Two times a day (BID) | NASAL | 12 refills | Status: DC

## 2019-07-17 MED ORDER — PREDNISONE 20 MG PO TABS: 20.0000 mg | ORAL_TABLET | Freq: Every day | ORAL | 0 refills | Status: DC

## 2019-07-17 MED ORDER — AMOXICILLIN-POT CLAVULANATE 875-125 MG PO TABS: 1.0000 | ORAL_TABLET | Freq: Two times a day (BID) | ORAL | 0 refills | Status: DC

## 2019-07-17 NOTE — Assessment & Plan Note (Signed)
We will treat flare with Augmentin and prednisone.  She will continue Astelin.  Advised her to follow-up with ENT soon of still not improving.

## 2019-07-17 NOTE — Progress Notes (Signed)
   [name] [name] is a 86 y.o. female who presents today for an office visit.  Assessment/Plan:  Chronic Problems Addressed Today: Chronic sinusitis We will treat flare with Augmentin and prednisone.  She will continue Astelin.  Advised her to follow-up with ENT soon of still not improving.     Subjective:  HPI:  Patient seen about a week and a half ago via virtual visit.  He is having rhinitis and concern for possible sinus infection at that time.  We started azithromycin but symptoms however not improved.  She has been using Astelin with some improvement.  She has had issues with sinuses and rhinorrhea for quite a while at this point.  Symptoms seem to be worse during the spring during pollen season as we are in now.  No reported fevers or chills.       Objective:  Physical Exam: BP (!) 170/76   Pulse 72   Temp 98 F (36.7 C)   Ht 5' 3.5" (1.613 m)   Wt 143 lb 6.1 oz (65 kg)   SpO2 93%   BMI 25.00 kg/m   Gen: No acute distress, resting comfortably HEENT: EACs with cerumen bilaterally.  Nasal mucosa erythematous and boggy with clear discharge.  Bilateral tonsillar hypertrophy noted. CV: Regular rate and rhythm with no murmurs appreciated Pulm: Normal work of breathing, clear to auscultation bilaterally with no crackles, wheezes, or rhonchi Neuro: Grossly normal, moves all extremities Psych: Normal affect and thought content      Britiany Silbernagel M. Jerline Pain, MD 07/17/2019 8:24 AM

## 2019-07-17 NOTE — Patient Instructions (Signed)
It was very nice to see you today!  Please start the prednisone and Augmentin.  Please continue with Astelin.  Please schedule appoint with your ENT if your symptoms are not improving in the next 1 to 2 weeks.  Take care, Dr Jerline Pain  Please try these tips to maintain a healthy lifestyle:   Eat at least 3 REAL meals and 1-2 snacks per day.  Aim for no more than 5 hours between eating.  If you eat breakfast, please do so within one hour of getting up.    Each meal should contain half fruits/vegetables, one quarter protein, and one quarter carbs (no bigger than a computer mouse)   Cut down on sweet beverages. This includes juice, soda, and sweet tea.     Drink at least 1 glass of water with each meal and aim for at least 8 glasses per day   Exercise at least 150 minutes every week.

## 2019-07-22 NOTE — Progress Notes (Signed)
CARDIOLOGY OFFICE NOTE  Date:  07/29/2019    [name] Date of Birth: 22-Apr-1927 Medical Record [id number]   PCP:  Vivi Barrack, MD  Cardiologist:  Marisa Cyphers    Chief Complaint  Patient presents with  . Follow-up    History of Present Illness: [name] [name] is a 86 y.o. female who presents today for a follow up visit. Seen for Dr. Marlou Porch.   She has a history ofthyroid cancer, CKD and hypoparathyroidism.   She has hadno prior cardiac hxand thenpresented to ER 10/15/18 with several episodes of sudden headache and feeling she would pass out. Nofrankloss of consciousness. She also had rightleg weakness and swelling. She hadshingles with rash across buttock. Alsowith anacute UTI.Noted SVT while on the monitor (11 second episode at a rate of 168) - CCB therapy was startedafter discussion with EP.Medical management felt to be most ideal treatment management.  I saw her last in October of 2020 - was doing ok - no real concerns.   The patient does not have symptoms concerning for COVID-19 infection (fever, chills, cough, or new shortness of breath).   Comes in today. Here with her daughter. She has some sinus issues - has had antibiotics. No COVID illness. She has had one vaccine for COVID so far. She is still trying to get out and work in her yard. No chest pain. No palpitations. No recent falls. Daughter augments the history and feels like she is doing well also.   Past Medical History:  Diagnosis Date  . ALLERGIC RHINITIS 11/02/2006  . CKD (chronic kidney disease), stage III   . HYPERLIPIDEMIA 11/04/2006  . HYPOTHYROIDISM, POSTSURGICAL 03/28/2007  . Malignant neoplasm of thyroid gland ([place]) 03/28/2007   5/07: righte lobectomy--path shows 3 cm follicular ca [id number]: completion left lobectomy, no more tumor seen 09/07: thyroglobulin 0.2 9ab neg) 11/07: i-131 rx, 101 mci 6/08: thyroglogulin 0.2 (ab neg) , hypothyroid body scan neg (clinically  tolerated hypothyroidism poorly) 06/09: tg undetectable (ab=1.5, normal) 12/10: tg undetectable (ab neg) 6/11: tg undectable (ab neg)  . OSTEOPENIA 11/02/2006    Past Surgical History:  Procedure Laterality Date  . ABDOMINAL HYSTERECTOMY    . CERVICAL POLYPECTOMY    . OPEN REDUCTION INTERNAL FIXATION (ORIF) DISTAL RADIAL FRACTURE Left 01/14/2014   Procedure: OPEN REDUCTION INTERNAL FIXATION DISTAL RADIAL FRACTURE;  Surgeon: Linna Hoff, MD;  Location: [facility];  Service: Orthopedics;  Laterality: Left;     Medications: Current Meds  Medication Sig  . azelastine (ASTELIN) 0.1 % nasal spray Place 2 sprays into both nostrils 2 (two) times daily.  . benzonatate (TESSALON) 200 MG capsule Take 1 capsule (200 mg total) by mouth 2 (two) times daily as needed for cough.  . calcitRIOL (ROCALTROL) 0.25 MCG capsule Take 1 capsule (0.25 mcg total) by mouth daily.  . calcium carbonate (OS-CAL) 600 MG TABS Take 600 mg by mouth at bedtime.   . cetirizine (ZYRTEC) 10 MG tablet Take 10 mg by mouth daily as needed for allergies or rhinitis.   Marland Kitchen docusate sodium (COLACE) 100 MG capsule Take 100 mg by mouth at bedtime.   Marland Kitchen levothyroxine (SYNTHROID) 112 MCG tablet Take 1 tablet (112 mcg total) by mouth daily before breakfast. PATIENT STATES SHE LOST HER RX. ASKING FOR REFILL  . Menthol-Ascorbic Acid (LUDENS COUGH DROPS MT) Use as directed 1 drop in the mouth or throat as needed (dry throat).  . Multiple Vitamins-Minerals (MULTIVITAMINS THER. W/MINERALS) TABS Take 1 tablet by  mouth daily.    . polyethylene glycol powder (GLYCOLAX/MIRALAX) 17 GM/SCOOP powder Take 4.25-8.5 g by mouth at bedtime.  . predniSONE (DELTASONE) 20 MG tablet Take 1 tablet (20 mg total) by mouth daily with breakfast.  . triamcinolone ointment (KENALOG) 0.5 % Apply 1 application topically 2 (two) times daily.  . valACYclovir (VALTREX) 1000 MG tablet Take 1 tablet (1,000 mg total) by mouth daily. Take 1 pill daily for 5 days for outbreaks.  .  verapamil (CALAN-SR) 120 MG CR tablet Take 1 tablet (120 mg total) by mouth daily.  . vitamin C (ASCORBIC ACID) 500 MG tablet Take 1 tablet (500 mg total) by mouth daily.     Allergies: No Known Allergies  Social History: The patient  reports that she has never smoked. She has never used smokeless tobacco.   Family History: The patient's family history includes Diabetes in her sister.   Review of Systems: Please see the history of present illness.   All other systems are reviewed and negative.   Physical Exam: VS:  BP (!) 148/72   Pulse 82   Ht 5' 3.5" (1.613 m)   Wt 142 lb 12.8 oz (64.8 kg)   SpO2 95%   BMI 24.90 kg/m  .  BMI Body mass index is 24.9 kg/m.  Wt Readings from Last 3 Encounters:  07/29/19 142 lb 12.8 oz (64.8 kg)  07/17/19 143 lb 6.1 oz (65 kg)  06/03/19 143 lb (64.9 kg)    General: Pleasant. Alert and in no acute distress.   Cardiac: Regular rate and rhythm. No murmurs, rubs, or gallops. No significant edema. Lots of varicosities noted.  Respiratory:  Lungs are clear to auscultation bilaterally with normal work of breathing.  GI: Soft and nontender.  MS: No deformity or atrophy. Gait and ROM intact.  Skin: Warm and dry. Color is normal.  Neuro:  Strength and sensation are intact and no gross focal deficits noted.  Psych: Alert, appropriate and with normal affect.   LABORATORY DATA:  EKG:  EKG is not ordered today.    Lab Results  Component Value Date   WBC 7.3 10/17/2018   HGB 12.5 10/17/2018   HCT 38.8 10/17/2018   PLT 295 10/17/2018   GLUCOSE 84 06/03/2019   CHOL 208 (HH) 11/06/2007   TRIG 72 11/06/2007   HDL 64.8 11/06/2007   LDLDIRECT 124.5 11/06/2007   ALT 14 10/17/2018   AST 24 10/17/2018   NA 139 06/03/2019   K 4.2 06/03/2019   CL 99 06/03/2019   CREATININE 0.96 06/03/2019   BUN 24 (H) 06/03/2019   CO2 32 06/03/2019   TSH 9.13 (H) 06/03/2019     BNP (last 3 results) No results for input(s): BNP in the last 8760  hours.  ProBNP (last 3 results) No results for input(s): PROBNP in the last 8760 hours.   Other Studies Reviewed Today:  Echo 10/16/18 IMPRESSIONS  1. The left ventricle has low normal systolic function, with an ejection fraction of 50-55%. The cavity size was normal. There is mildly increased left ventricular wall thickness. Left ventricular diastolic Doppler parameters are consistent with  impaired relaxation. Elevated left ventricular end-diastolic pressure No evidence of left ventricular regional wall motion abnormalities. 2. The right ventricle has normal systolic function. The cavity was normal. There is no increase in right ventricular wall thickness. 3. The aortic valve was not well visualized. Moderate sclerosis of the aortic valve. Aortic valve regurgitation was not assessed by color flow Doppler. Moderate aortic annular  calcification noted. 4. The mitral valve is degenerative. Mild thickening of the mitral valve leaflet. There is moderate mitral annular calcification present.   Assessment/Plan:  1. PSVT - her record notes that prior documentation was shown to Dr. Rayann Heman - she was symptomatic with loss of vision - spontaneous recovery once she was back in NSR - noted to be unusual for a SVT to cause frank syncope however, given her age, usually low normal BP - this was felt to have been at least a possibility. Minimal carotid plaque and normal brain MRI - she continues to be managed medically with CCB therapy.   2. Chronic sinus drainage - has just had a round of antibiotics.   3 Advanced age   65. COVID-19 Education: The signs and symptoms of COVID-19 were discussed with the patient and how to seek care for testing (follow up with PCP or arrange E-visit).  The importance of social distancing, staying at home, hand hygiene and wearing a mask when out in public were discussed today. She has had one vaccine.   Current medicines are reviewed with the patient today.  The  patient does not have concerns regarding medicines other than what has been noted above.  The following changes have been made:  See above.  Labs/ tests ordered today include:   No orders of the defined types were placed in this encounter.    Disposition:   FU with me in 6 months.   Patient is agreeable to this plan and will call if any problems develop in the interim.   SignedTruitt Merle, NP  07/29/2019 10:21 AM  [facility] [address] [place], [place]  [address] Phone: [phone] Fax: [phone]

## 2019-07-29 ENCOUNTER — Ambulatory Visit: Payer: PPO | Admitting: Nurse Practitioner

## 2019-07-29 ENCOUNTER — Other Ambulatory Visit: Payer: Self-pay

## 2019-07-29 ENCOUNTER — Encounter: Payer: Self-pay | Admitting: Nurse Practitioner

## 2019-07-29 VITALS — BP 148/72 | HR 82 | Ht 63.5 in | Wt 142.8 lb

## 2019-07-29 DIAGNOSIS — I471 Supraventricular tachycardia: Secondary | ICD-10-CM | POA: Diagnosis not present

## 2019-07-29 DIAGNOSIS — Z7189 Other specified counseling: Secondary | ICD-10-CM | POA: Diagnosis not present

## 2019-07-29 NOTE — Patient Instructions (Addendum)

## 2019-08-29 ENCOUNTER — Other Ambulatory Visit: Payer: Self-pay

## 2019-09-02 ENCOUNTER — Encounter: Payer: Self-pay | Admitting: Endocrinology

## 2019-09-02 ENCOUNTER — Ambulatory Visit: Payer: PPO | Admitting: Endocrinology

## 2019-09-02 ENCOUNTER — Other Ambulatory Visit: Payer: Self-pay

## 2019-09-02 VITALS — BP 170/80 | HR 93 | Ht 63.5 in | Wt 139.0 lb

## 2019-09-02 DIAGNOSIS — E890 Postprocedural hypothyroidism: Secondary | ICD-10-CM

## 2019-09-02 DIAGNOSIS — C73 Malignant neoplasm of thyroid gland: Secondary | ICD-10-CM | POA: Diagnosis not present

## 2019-09-02 DIAGNOSIS — E208 Other hypoparathyroidism: Secondary | ICD-10-CM

## 2019-09-02 LAB — VITAMIN D 25 HYDROXY (VIT D DEFICIENCY, FRACTURES): VITD: 48.5 ng/mL (ref 30.00–100.00)

## 2019-09-02 LAB — TSH: TSH: 5.17 u[IU]/mL — ABNORMAL HIGH (ref 0.35–4.50)

## 2019-09-02 LAB — T4, FREE: Free T4: 1.05 ng/dL (ref 0.60–1.60)

## 2019-09-02 MED ORDER — LEVOTHYROXINE SODIUM 125 MCG PO TABS: 125.0000 ug | ORAL_TABLET | Freq: Every day | ORAL | 3 refills | Status: DC

## 2019-09-02 NOTE — Patient Instructions (Addendum)
Your blood pressure is high today.  Please see your primary care provider soon, to have it rechecked.   Blood tests are requested for you today.  We'll let you know about the results.   Please come back for a follow-up appointment in 4 months.   

## 2019-09-02 NOTE — Progress Notes (Signed)
Subjective:    Patient ID: [name] [name], female    DOB: Nov 15, 1927, 86 y.o.   MRN: [id number]  HPI Pt returns for f/u of Stage-2 FTC:    5/07:  right lobectomy--path shows 3 cm right lobe follicular ca (T2 N0 M0).   8/07: completion left lobectomy, no more tumor seen.   9/07  thyroglobulin 11.8 (ab neg).   11/07: RAI, 101 mCi 6/08 thyroglogulin 0.2 (ab neg).   6/08: hypothyroid body scan neg (she tolerated thyroid hormone withdrawal poorly).   6/09 tg undetectable (ab=1.5) 12/10: tg undetectable (ab neg) 6/11:  tg undetectable (ab neg) 6/12: tg undetectable (ab neg) 6/13: tg undetectable (ab neg) 6/15: tg undetectable (ab neg) 6/16: tg undetectable (ab neg).   8/17: tg=0.1 (ab neg).  2/19: tg undetectable (ab neg). 5/20: tg undetectable (ab neg).  She denies any nodule at the neck.  Postsurgical hypothyroidism (due to SVT, she is not a candidate for TSH suppression): she takes synthroid as rx'ed.  Postsurgical hypoparathyroidism (she takes rocaltrol; due to SVT, goal Ca++ in normal range).   Pt says she takes rocaltrol as rx'ed.  She takes vit-D, but she does not know the dosage.  Past Medical History:  Diagnosis Date  . ALLERGIC RHINITIS 11/02/2006  . CKD (chronic kidney disease), stage III   . HYPERLIPIDEMIA 11/04/2006  . HYPOTHYROIDISM, POSTSURGICAL 03/28/2007  . Malignant neoplasm of thyroid gland ([place]) 03/28/2007   5/07: righte lobectomy--path shows 3 cm follicular ca [id number]: completion left lobectomy, no more tumor seen 09/07: thyroglobulin 0.2 9ab neg) 11/07: i-131 rx, 101 mci 6/08: thyroglogulin 0.2 (ab neg) , hypothyroid body scan neg (clinically tolerated hypothyroidism poorly) 06/09: tg undetectable (ab=1.5, normal) 12/10: tg undetectable (ab neg) 6/11: tg undectable (ab neg)  . OSTEOPENIA 11/02/2006    Past Surgical History:  Procedure Laterality Date  . ABDOMINAL HYSTERECTOMY    . CERVICAL POLYPECTOMY    . OPEN REDUCTION INTERNAL FIXATION (ORIF) DISTAL RADIAL  FRACTURE Left 01/14/2014   Procedure: OPEN REDUCTION INTERNAL FIXATION DISTAL RADIAL FRACTURE;  Surgeon: Linna Hoff, MD;  Location: [facility];  Service: Orthopedics;  Laterality: Left;    Social History   Socioeconomic History  . Marital status: Widowed    Spouse name: Not on file  . Number of children: Not on file  . Years of education: Not on file  . Highest education level: Not on file  Occupational History  . Not on file  Tobacco Use  . Smoking status: Never Smoker  . Smokeless tobacco: Never Used  Substance and Sexual Activity  . Alcohol use: Not on file  . Drug use: Not on file  . Sexual activity: Not on file  Other Topics Concern  . Not on file  Social History Narrative   Doesn't drive   Social Determinants of Health   Financial Resource Strain:   . Difficulty of Paying Living Expenses:   Food Insecurity:   . Worried About Charity fundraiser in the Last Year:   . Arboriculturist in the Last Year:   Transportation Needs:   . Film/video editor (Medical):   Marland Kitchen Lack of Transportation (Non-Medical):   Physical Activity:   . Days of Exercise per Week:   . Minutes of Exercise per Session:   Stress:   . Feeling of Stress :   Social Connections:   . Frequency of Communication with Friends and Family:   . Frequency of Social Gatherings with Friends and Family:   .  Attends Religious Services:   . Active Member of Clubs or Organizations:   . Attends Archivist Meetings:   Marland Kitchen Marital Status:   Intimate Partner Violence:   . Fear of Current or Ex-Partner:   . Emotionally Abused:   Marland Kitchen Physically Abused:   . Sexually Abused:     Current Outpatient Medications on File Prior to Visit  Medication Sig Dispense Refill  . azelastine (ASTELIN) 0.1 % nasal spray Place 2 sprays into both nostrils 2 (two) times daily. 30 mL 12  . benzonatate (TESSALON) 200 MG capsule Take 1 capsule (200 mg total) by mouth 2 (two) times daily as needed for cough. 30 capsule 0  .  calcitRIOL (ROCALTROL) 0.25 MCG capsule Take 1 capsule (0.25 mcg total) by mouth daily. 30 capsule 11  . calcium carbonate (OS-CAL) 600 MG TABS Take 600 mg by mouth at bedtime.     . cetirizine (ZYRTEC) 10 MG tablet Take 10 mg by mouth daily as needed for allergies or rhinitis.     Marland Kitchen docusate sodium (COLACE) 100 MG capsule Take 100 mg by mouth at bedtime.     . Menthol-Ascorbic Acid (LUDENS COUGH DROPS MT) Use as directed 1 drop in the mouth or throat as needed (dry throat).    . Multiple Vitamins-Minerals (MULTIVITAMINS THER. W/MINERALS) TABS Take 1 tablet by mouth daily.      . polyethylene glycol powder (GLYCOLAX/MIRALAX) 17 GM/SCOOP powder Take 4.25-8.5 g by mouth at bedtime.    . valACYclovir (VALTREX) 1000 MG tablet Take 1 tablet (1,000 mg total) by mouth daily. Take 1 pill daily for 5 days for outbreaks. 30 tablet 1  . verapamil (CALAN-SR) 120 MG CR tablet Take 1 tablet (120 mg total) by mouth daily. 90 tablet 1  . vitamin C (ASCORBIC ACID) 500 MG tablet Take 1 tablet (500 mg total) by mouth daily. 50 tablet 0   No current facility-administered medications on file prior to visit.    No Known Allergies  Family History  Problem Relation Age of Onset  . Diabetes Sister     BP (!) 170/80   Pulse 93   Ht 5' 3.5" (1.613 m)   Wt 139 lb (63 kg)   SpO2 98%   BMI 24.24 kg/m    Review of Systems Denies muscle cramps    Objective:   Physical Exam VITAL SIGNS:  See vs page GENERAL: no distress Neck: a healed scar is present.  I do not appreciate a nodule in the thyroid or elsewhere in the neck.     Lab Results  Component Value Date   TSH 5.17 (H) 09/02/2019       Assessment & Plan:  HTN: is noted today Hypothyroidism: worse:  I have sent a prescription to your pharmacy, to increase synthroid Hypoparathyroidism: recheck today.  Patient Instructions  Your blood pressure is high today.  Please see your primary care provider soon, to have it rechecked.   Blood tests are  requested for you today.  We'll let you know about the results.  Please come back for a follow-up appointment in 4 months.

## 2019-09-03 LAB — PTH, INTACT AND CALCIUM
Calcium: 8.6 mg/dL (ref 8.6–10.4)
PTH: 14 pg/mL (ref 14–64)

## 2019-09-05 ENCOUNTER — Telehealth: Payer: Self-pay | Admitting: Family Medicine

## 2019-09-05 MED ORDER — TRIAMCINOLONE ACETONIDE 0.5 % EX OINT: 1.0000 "application " | TOPICAL_OINTMENT | Freq: Two times a day (BID) | CUTANEOUS | 0 refills | Status: DC

## 2019-09-05 NOTE — Telephone Encounter (Signed)
MEDICATION: Triamcinolone ointment 0.5%  PHARMACY: [facility] [address]  Comments:   **Let patient know to contact pharmacy at the end of the day to make sure medication is ready. **  ** Please notify patient to allow 48-72 hours to process**  **Encourage patient to contact the pharmacy for refills or they can request refills through [facility]**

## 2019-09-05 NOTE — Telephone Encounter (Signed)
[name] [name], has called back.  I have informed.

## 2019-09-05 NOTE — Telephone Encounter (Signed)
Left message on voicemail to call office. Rx was sent to pharmacy.

## 2019-11-03 ENCOUNTER — Telehealth: Payer: Self-pay | Admitting: Family Medicine

## 2019-11-03 NOTE — Progress Notes (Signed)
  Chronic Care Management   Outreach Note  11/03/2019 Name: [name] [name] MRN: [id number] DOB: 08/20/1927  Referred by: Vivi Barrack, MD Reason for referral : No chief complaint on file.   An unsuccessful telephone outreach was attempted today. The patient was referred to the pharmacist for assistance with care management and care coordination.   Follow Up Plan:   Carley Perdue [employer] Scheduler

## 2019-11-27 ENCOUNTER — Telehealth: Payer: Self-pay | Admitting: Family Medicine

## 2019-11-27 NOTE — Progress Notes (Signed)
  Chronic Care Management   Outreach Note  11/27/2019 Name: [name] [name] MRN: [id number] DOB: 17-Oct-1927  Referred by: Vivi Barrack, MD Reason for referral : No chief complaint on file.   An unsuccessful telephone outreach was attempted today. The patient was referred to the pharmacist for assistance with care management and care coordination.   Follow Up Plan:   Earney Hamburg [employer] Scheduler

## 2019-12-09 ENCOUNTER — Telehealth: Payer: Self-pay | Admitting: Family Medicine

## 2019-12-09 NOTE — Progress Notes (Signed)
°  Chronic Care Management   Outreach Note  12/09/2019 Name: [name] [name] MRN: [id number] DOB: 11-19-1927  Referred by: Vivi Barrack, MD Reason for referral : No chief complaint on file.   An unsuccessful telephone outreach was attempted today. The patient was referred to the pharmacist for assistance with care management and care coordination.   Follow Up Plan:   Earney Hamburg [employer] Scheduler

## 2020-01-05 ENCOUNTER — Ambulatory Visit: Payer: PPO | Admitting: Endocrinology

## 2020-01-13 ENCOUNTER — Encounter: Payer: Self-pay | Admitting: Endocrinology

## 2020-01-13 ENCOUNTER — Other Ambulatory Visit: Payer: Self-pay

## 2020-01-13 ENCOUNTER — Ambulatory Visit (INDEPENDENT_AMBULATORY_CARE_PROVIDER_SITE_OTHER): Payer: PPO | Admitting: Endocrinology

## 2020-01-13 VITALS — BP 132/74 | HR 98 | Ht 63.5 in | Wt 134.4 lb

## 2020-01-13 DIAGNOSIS — C73 Malignant neoplasm of thyroid gland: Secondary | ICD-10-CM

## 2020-01-13 DIAGNOSIS — E208 Other hypoparathyroidism: Secondary | ICD-10-CM

## 2020-01-13 DIAGNOSIS — E890 Postprocedural hypothyroidism: Secondary | ICD-10-CM

## 2020-01-13 LAB — MAGNESIUM: Magnesium: 1.9 mg/dL (ref 1.5–2.5)

## 2020-01-13 LAB — TSH: TSH: 0.26 u[IU]/mL — ABNORMAL LOW (ref 0.35–4.50)

## 2020-01-13 LAB — T4, FREE: Free T4: 1.34 ng/dL (ref 0.60–1.60)

## 2020-01-13 LAB — VITAMIN D 25 HYDROXY (VIT D DEFICIENCY, FRACTURES): VITD: 42.32 ng/mL (ref 30.00–100.00)

## 2020-01-13 NOTE — Progress Notes (Signed)
Subjective:    Patient ID: [name] [name], female    DOB: 1927/12/26, 86 y.o.   MRN: [id number]  HPI Pt returns for f/u of Stage-2 FTC:    5/07:  right lobectomy--path shows 3 cm right lobe follicular ca (T2 N0 M0).   8/07: completion left lobectomy, no more tumor seen.   9/07  thyroglobulin 11.8 (ab neg).   11/07: RAI, 101 mCi 6/08 thyroglogulin 0.2 (ab neg).   6/08: hypothyroid body scan neg (she tolerated thyroid hormone withdrawal poorly).   6/09 tg undetectable (ab=1.5) 12/10: tg undetectable (ab neg) 6/11:  tg undetectable (ab neg) 6/12: tg undetectable (ab neg) 6/13: tg undetectable (ab neg) 6/15: tg undetectable (ab neg) 6/16: tg undetectable (ab neg).   8/17: tg=0.1 (ab neg).  2/19: tg undetectable (ab neg). 5/20: tg undetectable (ab neg).  3/21 tg undetectable (ab neg).  She denies any nodule at the neck.  Postsurgical hypothyroidism (due to SVT, she is not a candidate for TSH suppression): she takes synthroid as rx'ed.  Postsurgical hypoparathyroidism (she takes rocaltrol; due to SVT, goal Ca++ in normal range).   Pt says she takes rocaltrol as rx'ed.  She takes vit-D, but she does not know the dosage.  Past Medical History:  Diagnosis Date  . ALLERGIC RHINITIS 11/02/2006  . CKD (chronic kidney disease), stage III ([place])   . HYPERLIPIDEMIA 11/04/2006  . HYPOTHYROIDISM, POSTSURGICAL 03/28/2007  . Malignant neoplasm of thyroid gland ([place]) 03/28/2007   5/07: righte lobectomy--path shows 3 cm follicular ca 0/96: completion left lobectomy, no more tumor seen 09/07: thyroglobulin 0.2 9ab neg) 11/07: i-131 rx, 101 mci 6/08: thyroglogulin 0.2 (ab neg) , hypothyroid body scan neg (clinically tolerated hypothyroidism poorly) 06/09: tg undetectable (ab=1.5, normal) 12/10: tg undetectable (ab neg) 6/11: tg undectable (ab neg)  . OSTEOPENIA 11/02/2006    Past Surgical History:  Procedure Laterality Date  . ABDOMINAL HYSTERECTOMY    . CERVICAL POLYPECTOMY    . OPEN REDUCTION  INTERNAL FIXATION (ORIF) DISTAL RADIAL FRACTURE Left 01/14/2014   Procedure: OPEN REDUCTION INTERNAL FIXATION DISTAL RADIAL FRACTURE;  Surgeon: Linna Hoff, MD;  Location: [facility];  Service: Orthopedics;  Laterality: Left;    Social History   Socioeconomic History  . Marital status: Widowed    Spouse name: Not on file  . Number of children: Not on file  . Years of education: Not on file  . Highest education level: Not on file  Occupational History  . Not on file  Tobacco Use  . Smoking status: Never Smoker  . Smokeless tobacco: Never Used  Substance and Sexual Activity  . Alcohol use: Not on file  . Drug use: Not on file  . Sexual activity: Not on file  Other Topics Concern  . Not on file  Social History Narrative   Doesn't drive   Social Determinants of Health   Financial Resource Strain:   . Difficulty of Paying Living Expenses: Not on file  Food Insecurity:   . Worried About Charity fundraiser in the Last Year: Not on file  . Ran Out of Food in the Last Year: Not on file  Transportation Needs:   . Lack of Transportation (Medical): Not on file  . Lack of Transportation (Non-Medical): Not on file  Physical Activity:   . Days of Exercise per Week: Not on file  . Minutes of Exercise per Session: Not on file  Stress:   . Feeling of Stress : Not on file  Social Connections:   .  Frequency of Communication with Friends and Family: Not on file  . Frequency of Social Gatherings with Friends and Family: Not on file  . Attends Religious Services: Not on file  . Active Member of Clubs or Organizations: Not on file  . Attends Archivist Meetings: Not on file  . Marital Status: Not on file  Intimate Partner Violence:   . Fear of Current or Ex-Partner: Not on file  . Emotionally Abused: Not on file  . Physically Abused: Not on file  . Sexually Abused: Not on file    Current Outpatient Medications on File Prior to Visit  Medication Sig Dispense Refill  .  azelastine (ASTELIN) 0.1 % nasal spray Place 2 sprays into both nostrils 2 (two) times daily. 30 mL 12  . benzonatate (TESSALON) 200 MG capsule Take 1 capsule (200 mg total) by mouth 2 (two) times daily as needed for cough. 30 capsule 0  . calcitRIOL (ROCALTROL) 0.25 MCG capsule Take 1 capsule (0.25 mcg total) by mouth daily. 30 capsule 11  . calcium carbonate (OS-CAL) 600 MG TABS Take 600 mg by mouth at bedtime.     . cetirizine (ZYRTEC) 10 MG tablet Take 10 mg by mouth daily as needed for allergies or rhinitis.     Marland Kitchen docusate sodium (COLACE) 100 MG capsule Take 100 mg by mouth at bedtime.     Marland Kitchen levothyroxine (SYNTHROID) 125 MCG tablet Take 1 tablet (125 mcg total) by mouth daily. 90 tablet 3  . Menthol-Ascorbic Acid (LUDENS COUGH DROPS MT) Use as directed 1 drop in the mouth or throat as needed (dry throat).    . Multiple Vitamins-Minerals (MULTIVITAMINS THER. W/MINERALS) TABS Take 1 tablet by mouth daily.      . polyethylene glycol powder (GLYCOLAX/MIRALAX) 17 GM/SCOOP powder Take 4.25-8.5 g by mouth at bedtime.    . triamcinolone ointment (KENALOG) 0.5 % Apply 1 application topically 2 (two) times daily. 30 g 0  . valACYclovir (VALTREX) 1000 MG tablet Take 1 tablet (1,000 mg total) by mouth daily. Take 1 pill daily for 5 days for outbreaks. 30 tablet 1  . verapamil (CALAN-SR) 120 MG CR tablet Take 1 tablet (120 mg total) by mouth daily. 90 tablet 1  . vitamin C (ASCORBIC ACID) 500 MG tablet Take 1 tablet (500 mg total) by mouth daily. 50 tablet 0   No current facility-administered medications on file prior to visit.    No Known Allergies  Family History  Problem Relation Age of Onset  . Diabetes Sister     BP 132/74   Pulse 98   Ht 5' 3.5" (1.613 m)   Wt 134 lb 6.4 oz (61 kg)   SpO2 95%   BMI 23.43 kg/m    Review of Systems     Objective:   Physical Exam VITAL SIGNS:  See vs page GENERAL: no distress Neck: a healed scar is present.  I do not appreciate a nodule in the  thyroid or elsewhere in the neck   Lab Results  Component Value Date   TSH 0.26 (L) 01/13/2020   Lab Results  Component Value Date   PTH 14 01/13/2020   CALCIUM 8.2 (L) 01/13/2020   PHOS 3.7 10/16/2018   25-OH vit-D=normal    Assessment & Plan:  Hypothyroidism: slightly overreplaced. I told pt that due to variable TSH result, I favor continuing the same synthroid Hypoparathyroidism: I told pt that this is at goal Ca++, so Please continue the same rocaltrol Vit-D def: well-replaced  Patient Instructions  Blood tests are requested for you today.  We'll let you know about the results.  Please come back for a follow-up appointment in 6 months.

## 2020-01-13 NOTE — Patient Instructions (Addendum)
Blood tests are requested for you today.  We'll let you know about the results.  Please come back for a follow-up appointment in 6 months.   

## 2020-01-14 LAB — PTH, INTACT AND CALCIUM
Calcium: 8.2 mg/dL — ABNORMAL LOW (ref 8.6–10.4)
PTH: 14 pg/mL (ref 14–64)

## 2020-01-26 NOTE — Progress Notes (Signed)
CARDIOLOGY OFFICE NOTE  Date:  02/03/2020    [name] [name] Date of Birth: [birth year] Medical Record #[id number]  PCP:  Vivi Barrack, MD  Cardiologist:  Marisa Cyphers  Chief Complaint  Patient presents with  . Follow-up    Seen for Dr. Marlou Porch    History of Present Illness: [name] [name] is a 86 y.o. female who presents today for a 6 month check. Seen for Dr. Marlou Porch.   She has a history ofthyroid cancer, CKD and hypoparathyroidism.   She has hadno prior cardiac hxand thenpresented to ER 10/15/18 with several episodes of sudden headache and feeling she would pass out. Nofrankloss of consciousness. She also had rightleg weakness and swelling. She hadshingles with rash across buttock. Alsowith anacute UTI.Noted SVT while on the monitor (11 second episode at a rate of 168) - CCB therapy was startedafter discussion with EP.Medical management felt to be most ideal treatment management for her.  I saw her last in April - she was doing well - had had some sinus issues.   Comes in today. Here with her daughter who augments the history. She continues to do ok. No chest pain. Not short of breath. No palpitations. She continues to have some sinus issues. She had a fall on Sunday while at her sister's house - probably slipped on a zip lock bag - fortunately, did not get hurt. Tolerating her medicines. Overall, seems to be doing ok.   Past Medical History:  Diagnosis Date  . ALLERGIC RHINITIS 11/02/2006  . CKD (chronic kidney disease), stage III ([place])   . HYPERLIPIDEMIA 11/04/2006  . HYPOTHYROIDISM, POSTSURGICAL 03/28/2007  . Malignant neoplasm of thyroid gland ([place]) 03/28/2007   5/07: righte lobectomy--path shows 3 cm follicular ca 1/32: completion left lobectomy, no more tumor seen 09/07: thyroglobulin 0.2 9ab neg) 11/07: i-131 rx, 101 mci 6/08: thyroglogulin 0.2 (ab neg) , hypothyroid body scan neg (clinically tolerated hypothyroidism poorly) 06/09: tg  undetectable (ab=1.5, normal) 12/10: tg undetectable (ab neg) 6/11: tg undectable (ab neg)  . OSTEOPENIA 11/02/2006    Past Surgical History:  Procedure Laterality Date  . ABDOMINAL HYSTERECTOMY    . CERVICAL POLYPECTOMY    . OPEN REDUCTION INTERNAL FIXATION (ORIF) DISTAL RADIAL FRACTURE Left 01/14/2014   Procedure: OPEN REDUCTION INTERNAL FIXATION DISTAL RADIAL FRACTURE;  Surgeon: Linna Hoff, MD;  Location: [facility];  Service: Orthopedics;  Laterality: Left;     Medications: Current Meds  Medication Sig  . azelastine (ASTELIN) 0.1 % nasal spray Place 2 sprays into both nostrils 2 (two) times daily.  . benzonatate (TESSALON) 200 MG capsule Take 1 capsule (200 mg total) by mouth 2 (two) times daily as needed for cough.  . calcitRIOL (ROCALTROL) 0.25 MCG capsule Take 1 capsule (0.25 mcg total) by mouth daily.  . calcium carbonate (OS-CAL) 600 MG TABS Take 600 mg by mouth at bedtime.   . docusate sodium (COLACE) 100 MG capsule Take 100 mg by mouth at bedtime.   Marland Kitchen levothyroxine (SYNTHROID) 125 MCG tablet Take 1 tablet (125 mcg total) by mouth daily.  . Menthol-Ascorbic Acid (LUDENS COUGH DROPS MT) Use as directed 1 drop in the mouth or throat as needed (dry throat).  . Multiple Vitamins-Minerals (MULTIVITAMINS THER. W/MINERALS) TABS Take 1 tablet by mouth daily.    . polyethylene glycol powder (GLYCOLAX/MIRALAX) 17 GM/SCOOP powder Take 4.25-8.5 g by mouth at bedtime.  . triamcinolone ointment (KENALOG) 0.5 % Apply 1 application topically 2 (two) times daily.  Marland Kitchen  valACYclovir (VALTREX) 1000 MG tablet Take 1 tablet (1,000 mg total) by mouth daily. Take 1 pill daily for 5 days for outbreaks.  . verapamil (CALAN-SR) 120 MG CR tablet Take 1 tablet (120 mg total) by mouth daily.  . vitamin C (ASCORBIC ACID) 500 MG tablet Take 1 tablet (500 mg total) by mouth daily.     Allergies: No Known Allergies  Social History: The patient  reports that she has never smoked. She has never used smokeless  tobacco.   Family History: The patient's family history includes Diabetes in her sister.   Review of Systems: Please see the history of present illness.   All other systems are reviewed and negative.   Physical Exam: VS:  BP 130/72   Pulse 85   Ht 5' 3.5" (1.613 m)   Wt 133 lb 3.2 oz (60.4 kg)   SpO2 95%   BMI 23.23 kg/m  .  BMI Body mass index is 23.23 kg/m.  Wt Readings from Last 3 Encounters:  02/03/20 133 lb 3.2 oz (60.4 kg)  01/13/20 134 lb 6.4 oz (61 kg)  09/02/19 139 lb (63 kg)    General: Pleasant. Well developed, well nourished and in no acute distress.   HEENT: Normal.  Neck: Supple, no JVD, carotid bruits, or masses noted.  Cardiac: Regular rate and rhythm. No murmurs, rubs, or gallops. No edema.  Respiratory:  Lungs are clear to auscultation bilaterally with normal work of breathing.  GI: Soft and nontender.  MS: No deformity or atrophy. Gait and ROM intact.  Skin: Warm and dry. Color is normal.  Neuro:  Strength and sensation are intact and no gross focal deficits noted.  Psych: Alert, appropriate and with normal affect.   LABORATORY DATA:  EKG:  EKG is ordered today.  Personally reviewed by me. This demonstrates NSR - LVH and septal Q's - unchanged.  Lab Results  Component Value Date   WBC 7.3 10/17/2018   HGB 12.5 10/17/2018   HCT 38.8 10/17/2018   PLT 295 10/17/2018   GLUCOSE 84 06/03/2019   CHOL 208 (HH) 11/06/2007   TRIG 72 11/06/2007   HDL 64.8 11/06/2007   LDLDIRECT 124.5 11/06/2007   ALT 14 10/17/2018   AST 24 10/17/2018   NA 139 06/03/2019   K 4.2 06/03/2019   CL 99 06/03/2019   CREATININE 0.96 06/03/2019   BUN 24 (H) 06/03/2019   CO2 32 06/03/2019   TSH 0.26 (L) 01/13/2020     BNP (last 3 results) No results for input(s): BNP in the last 8760 hours.  ProBNP (last 3 results) No results for input(s): PROBNP in the last 8760 hours.   Other Studies Reviewed Today:  Echo 10/16/18 IMPRESSIONS  1. The left ventricle has low  normal systolic function, with an ejection fraction of 50-55%. The cavity size was normal. There is mildly increased left ventricular wall thickness. Left ventricular diastolic Doppler parameters are consistent with  impaired relaxation. Elevated left ventricular end-diastolic pressure No evidence of left ventricular regional wall motion abnormalities. 2. The right ventricle has normal systolic function. The cavity was normal. There is no increase in right ventricular wall thickness. 3. The aortic valve was not well visualized. Moderate sclerosis of the aortic valve. Aortic valve regurgitation was not assessed by color flow Doppler. Moderate aortic annular calcification noted. 4. The mitral valve is degenerative. Mild thickening of the mitral valve leaflet. There is moderate mitral annular calcification present.   Assessment/Plan:  PSVT - her record notes that prior  documentation was shown to Dr. Rayann Heman - she was symptomatic with loss of vision - spontaneous recovery once she was back in NSR - noted to be unusual for a SVT to cause frank syncope, however, given her age, usually low normal BP - this was felt to have at least been a possibility. She is managed medically. No changes made today.   Advanced age - recent fall - encouraged her to be careful.    Current medicines are reviewed with the patient today.  The patient does not have concerns regarding medicines other than what has been noted above.  The following changes have been made:  See above.  Labs/ tests ordered today include:    Orders Placed This Encounter  Procedures  . EKG 12-Lead     Disposition:   FU with Dr. Marlou Porch in 12 months.    Patient is agreeable to this plan and will call if any problems develop in the interim.   SignedTruitt Merle, NP  02/03/2020 12:20 PM  [facility] [address] [place], [place]  [address] Phone: [phone] Fax: [phone]

## 2020-01-28 ENCOUNTER — Ambulatory Visit: Payer: PPO | Admitting: Nurse Practitioner

## 2020-02-03 ENCOUNTER — Other Ambulatory Visit: Payer: Self-pay

## 2020-02-03 ENCOUNTER — Ambulatory Visit: Payer: PPO | Admitting: Nurse Practitioner

## 2020-02-03 ENCOUNTER — Encounter: Payer: Self-pay | Admitting: Nurse Practitioner

## 2020-02-03 VITALS — BP 130/72 | HR 85 | Ht 63.5 in | Wt 133.2 lb

## 2020-02-03 DIAGNOSIS — I471 Supraventricular tachycardia, unspecified: Secondary | ICD-10-CM

## 2020-02-03 DIAGNOSIS — R002 Palpitations: Secondary | ICD-10-CM | POA: Diagnosis not present

## 2020-02-03 NOTE — Patient Instructions (Addendum)
After Visit Summary:  We will be checking the following labs today - NONE   Medication Instructions:    Continue with your current medicines.    If you need a refill on your cardiac medications before your next appointment, please call your pharmacy.     Testing/Procedures To Be Arranged:  N/A  Follow-Up:   See [place] back in one year.  You will receive a reminder letter in the mail two months in advance. If you don't receive a letter, please call our office to schedule the follow-up appointment.   At [facility], you and your health needs are our priority.  As part of our continuing mission to provide you with exceptional heart care, we have created designated Provider Care Teams.  These Care Teams include your primary Cardiologist (physician) and Advanced Practice Providers (APPs -  Physician Assistants and Nurse Practitioners) who all work together to provide you with the care you need, when you need it.  Special Instructions:  . Stay safe, wash your hands for at least 20 seconds and wear a mask when needed.  . It was good to talk with you today.    Call the [place] office at [phone] if you have any questions, problems or concerns.

## 2020-03-28 ENCOUNTER — Other Ambulatory Visit: Payer: Self-pay | Admitting: Endocrinology

## 2020-03-28 ENCOUNTER — Other Ambulatory Visit: Payer: Self-pay | Admitting: Nurse Practitioner

## 2020-04-13 ENCOUNTER — Emergency Department (HOSPITAL_COMMUNITY): Payer: PPO

## 2020-04-13 ENCOUNTER — Emergency Department (HOSPITAL_COMMUNITY)
Admission: EM | Admit: 2020-04-13 | Discharge: 2020-04-13 | Disposition: A | Discharge status: Home / Self Care | Payer: PPO | Attending: Emergency Medicine | Admitting: Emergency Medicine

## 2020-04-13 ENCOUNTER — Encounter (HOSPITAL_COMMUNITY): Payer: Self-pay | Admitting: Emergency Medicine

## 2020-04-13 DIAGNOSIS — Z85850 Personal history of malignant neoplasm of thyroid: Secondary | ICD-10-CM | POA: Insufficient documentation

## 2020-04-13 DIAGNOSIS — S0101XA Laceration without foreign body of scalp, initial encounter: Secondary | ICD-10-CM | POA: Insufficient documentation

## 2020-04-13 DIAGNOSIS — Z79899 Other long term (current) drug therapy: Secondary | ICD-10-CM | POA: Insufficient documentation

## 2020-04-13 DIAGNOSIS — I10 Essential (primary) hypertension: Secondary | ICD-10-CM | POA: Diagnosis not present

## 2020-04-13 DIAGNOSIS — W07XXXA Fall from chair, initial encounter: Secondary | ICD-10-CM | POA: Diagnosis not present

## 2020-04-13 DIAGNOSIS — Z981 Arthrodesis status: Secondary | ICD-10-CM | POA: Diagnosis not present

## 2020-04-13 DIAGNOSIS — S065X9A Traumatic subdural hemorrhage with loss of consciousness of unspecified duration, initial encounter: Secondary | ICD-10-CM

## 2020-04-13 DIAGNOSIS — N183 Chronic kidney disease, stage 3 unspecified: Secondary | ICD-10-CM | POA: Diagnosis not present

## 2020-04-13 DIAGNOSIS — S61412A Laceration without foreign body of left hand, initial encounter: Secondary | ICD-10-CM | POA: Insufficient documentation

## 2020-04-13 DIAGNOSIS — E039 Hypothyroidism, unspecified: Secondary | ICD-10-CM | POA: Insufficient documentation

## 2020-04-13 DIAGNOSIS — I6782 Cerebral ischemia: Secondary | ICD-10-CM | POA: Diagnosis not present

## 2020-04-13 DIAGNOSIS — M25552 Pain in left hip: Secondary | ICD-10-CM | POA: Diagnosis not present

## 2020-04-13 DIAGNOSIS — S199XXA Unspecified injury of neck, initial encounter: Secondary | ICD-10-CM | POA: Diagnosis not present

## 2020-04-13 DIAGNOSIS — I6201 Nontraumatic acute subdural hemorrhage: Secondary | ICD-10-CM | POA: Diagnosis not present

## 2020-04-13 DIAGNOSIS — S065X0A Traumatic subdural hemorrhage without loss of consciousness, initial encounter: Secondary | ICD-10-CM | POA: Insufficient documentation

## 2020-04-13 DIAGNOSIS — S0003XA Contusion of scalp, initial encounter: Secondary | ICD-10-CM | POA: Diagnosis not present

## 2020-04-13 DIAGNOSIS — R9431 Abnormal electrocardiogram [ECG] [EKG]: Secondary | ICD-10-CM | POA: Diagnosis not present

## 2020-04-13 DIAGNOSIS — S065XAA Traumatic subdural hemorrhage with loss of consciousness status unknown, initial encounter: Secondary | ICD-10-CM

## 2020-04-13 DIAGNOSIS — S0990XA Unspecified injury of head, initial encounter: Secondary | ICD-10-CM | POA: Diagnosis present

## 2020-04-13 MED ORDER — TETANUS-DIPHTH-ACELL PERTUSSIS 5-2.5-18.5 LF-MCG/0.5 IM SUSY: 0.5000 mL | PREFILLED_SYRINGE | Freq: Once | INTRAMUSCULAR | Status: DC

## 2020-04-13 NOTE — ED Triage Notes (Signed)
Pt transported from home by [facility] after falling asleep in chair and falling forward out of chair, hitting head on hard wood floor. Hematoma to L side of head. Denies LOC, A & O, denies neck/head pain, no blood thinners.

## 2020-04-13 NOTE — ED Provider Notes (Signed)
[facility] EMERGENCY DEPARTMENT Provider Note   CSN: [id number] Arrival date & time: 04/13/20  [id number]     History Chief Complaint  Patient presents with  . Fall    [name] [name] is a 87 y.o. female.  HPI     This is a 87 year old female with a history of hyperlipidemia and chronic kidney disease who presents following a fall.  Patient reports that she was sitting on a stool this morning.  She fell asleep while sitting and fell forward hitting her head on the floor.  She denies being on any blood thinners.  She is only reporting left hip pain.  Denies neck pain.  She is able to get up on her own and advised her sister.  Unknown last tetanus shot.  Rates her pain at 6 out of 10.  Denies any recent illnesses and otherwise states she feels well  Past Medical History:  Diagnosis Date  . ALLERGIC RHINITIS 11/02/2006  . CKD (chronic kidney disease), stage III ([place])   . HYPERLIPIDEMIA 11/04/2006  . HYPOTHYROIDISM, POSTSURGICAL 03/28/2007  . Malignant neoplasm of thyroid gland ([place]) 03/28/2007   5/07: righte lobectomy--path shows 3 cm follicular ca [id number]: completion left lobectomy, no more tumor seen 09/07: thyroglobulin 0.2 9ab neg) 11/07: i-131 rx, [facility] 6/08: thyroglogulin 0.2 (ab neg) , hypothyroid body scan neg (clinically tolerated hypothyroidism poorly) 06/09: tg undetectable (ab=1.5, normal) 12/10: tg undetectable (ab neg) 6/11: tg undectable (ab neg)  . OSTEOPENIA 11/02/2006    Patient Active Problem List   Diagnosis Date Noted  . Chronic sinusitis 07/17/2019  . Right leg weakness 10/15/2018  . Corns and callus 09/25/2018  . Shingles 04/05/2018  . HLD (hyperlipidemia) 08/05/2017  . CKD (chronic kidney disease), stage III ([place]) 08/05/2017  . Numbness 11/19/2014  . Malignant neoplasm of thyroid gland ([place]) 03/28/2007  . HYPOTHYROIDISM, POSTSURGICAL 03/28/2007  . HYPOPARATHYROIDISM 03/28/2007  . Allergic rhinitis 11/02/2006  . OSTEOPENIA 11/02/2006    Past  Surgical History:  Procedure Laterality Date  . ABDOMINAL HYSTERECTOMY    . CERVICAL POLYPECTOMY    . OPEN REDUCTION INTERNAL FIXATION (ORIF) DISTAL RADIAL FRACTURE Left 01/14/2014   Procedure: OPEN REDUCTION INTERNAL FIXATION DISTAL RADIAL FRACTURE;  Surgeon: Linna Hoff, MD;  Location: [facility];  Service: Orthopedics;  Laterality: Left;     OB History   No obstetric history on file.     Family History  Problem Relation Age of Onset  . Diabetes Sister     Social History   Tobacco Use  . Smoking status: Never Smoker  . Smokeless tobacco: Never Used  Substance Use Topics  . Alcohol use: Not Currently  . Drug use: Never    Home Medications Prior to Admission medications   Medication Sig Start Date End Date Taking? Authorizing Provider  azelastine (ASTELIN) 0.1 % nasal spray Place 2 sprays into both nostrils 2 (two) times daily. 07/17/19   Vivi Barrack, MD  benzonatate (TESSALON) 200 MG capsule Take 1 capsule (200 mg total) by mouth 2 (two) times daily as needed for cough. 07/03/19   Vivi Barrack, MD  calcitRIOL (ROCALTROL) 0.25 MCG capsule Take 1 capsule by mouth once daily 03/28/20   Renato Shin, MD  calcium carbonate (OS-CAL) 600 MG TABS Take 600 mg by mouth at bedtime.     [provider]  docusate sodium (COLACE) 100 MG capsule Take 100 mg by mouth at bedtime.     [provider]  levothyroxine (SYNTHROID) 125  MCG tablet Take 1 tablet (125 mcg total) by mouth daily. 09/02/19   Renato Shin, MD  Menthol-Ascorbic Acid (LUDENS COUGH DROPS MT) Use as directed 1 drop in the mouth or throat as needed (dry throat).    [provider]  Multiple Vitamins-Minerals (MULTIVITAMINS THER. W/MINERALS) TABS Take 1 tablet by mouth daily.      [provider]  polyethylene glycol powder (GLYCOLAX/MIRALAX) 17 GM/SCOOP powder Take 4.25-8.5 g by mouth at bedtime.    [provider]  triamcinolone ointment (KENALOG) 0.5 % Apply 1 application  topically 2 (two) times daily. 09/05/19   Vivi Barrack, MD  valACYclovir (VALTREX) 1000 MG tablet Take 1 tablet (1,000 mg total) by mouth daily. Take 1 pill daily for 5 days for outbreaks. 04/25/19   Vivi Barrack, MD  verapamil (CALAN-SR) 120 MG CR tablet Take 1 tablet by mouth once daily 03/29/20   Burtis Junes, NP  vitamin C (ASCORBIC ACID) 500 MG tablet Take 1 tablet (500 mg total) by mouth daily. 01/14/14   Iran Planas, MD    Allergies    Patient has no known allergies.  Review of Systems   Review of Systems  Constitutional: Negative for fever.  Respiratory: Negative for shortness of breath.   Cardiovascular: Negative for chest pain.  Genitourinary: Negative for dysuria.  Neurological: Positive for headaches. Negative for dizziness and syncope.  All other systems reviewed and are negative.   Physical Exam Updated Vital Signs BP 138/75   Pulse 78   Temp 98.3 F (36.8 C) (Temporal)   Resp 16   Ht 1.6 m (5\' 3" )   Wt 60 kg   SpO2 92%   BMI 23.43 kg/m   Physical Exam Vitals and nursing note reviewed.  Constitutional:      Appearance: She is well-developed and well-nourished.     Comments: Elderly, very hard of hearing, ABCs intact  HENT:     Head: Normocephalic.     Comments: Hematoma with laceration noted to the left forehead    Nose: Nose normal.     Mouth/Throat:     Mouth: Mucous membranes are moist.  Eyes:     Pupils: Pupils are equal, round, and reactive to light.  Neck:     Comments: No midline tenderness palpation, step-off, deformity Cardiovascular:     Rate and Rhythm: Normal rate and regular rhythm.     Heart sounds: Normal heart sounds.  Pulmonary:     Effort: Pulmonary effort is normal. No respiratory distress.     Breath sounds: No wheezing.  Abdominal:     General: Bowel sounds are normal.     Palpations: Abdomen is soft.     Tenderness: There is no guarding or rebound.  Musculoskeletal:        General: No tenderness or deformity.  Normal range of motion.     Cervical back: Neck supple.     Comments: Normal range of motion bilateral hips and knees  Skin:    General: Skin is warm and dry.  Neurological:     Mental Status: She is alert and oriented to person, place, and time.     Comments: Skin tear dorsum of the left hand  Psychiatric:        Mood and Affect: Mood and affect and mood normal.     ED Results / Procedures / Treatments   Labs (all labs ordered are listed, but only abnormal results are displayed) Labs Reviewed - No data to display  EKG EKG Interpretation  Date/Time:  Tuesday April 13 2020 05:22:21 EST Ventricular Rate:  84 PR Interval:    QRS Duration: 126 QT Interval:  404 QTC Calculation: 478 R Axis:   -35 Text Interpretation: Sinus rhythm Left bundle branch block Confirmed by Ross Marcus [id number]) on 04/13/2020 5:29:51 AM   Radiology CT Head Wo Contrast  Result Date: 04/13/2020 CLINICAL DATA:  Minor head trauma EXAM: CT HEAD WITHOUT CONTRAST CT CERVICAL SPINE WITHOUT CONTRAST TECHNIQUE: Multidetector CT imaging of the head and cervical spine was performed following the standard protocol without intravenous contrast. Multiplanar CT image reconstructions of the cervical spine were also generated. COMPARISON:  10/16/2018 brain MRI FINDINGS: CT HEAD FINDINGS Brain: 3 mm thick subdural hematoma along the right cerebral convexity which is seen on both axial and coronal images. No parenchymal or subarachnoid hemorrhage. No visible infarct or hydrocephalus. Small vessel ischemia in the cerebral white matter which is generalized. Vascular: No hyperdense vessel or unexpected calcification. Skull: High left scalp hematoma without calvarial fracture Sinuses/Orbits: Negative Critical Value/emergent results were called by telephone at the time of interpretation on 04/13/2020 at 6:26 am to provider [facility] , who verbally acknowledged these results. CT CERVICAL SPINE FINDINGS Alignment: No traumatic  malalignment Skull base and vertebrae: Negative for acute fracture Soft tissues and spinal canal: No prevertebral fluid or swelling. No visible canal hematoma. Thyroidectomy. Disc levels: Multilevel disc and facet degenerative spurring. C5-6 ACDF for ankylosis. Upper chest: No evidence of injury IMPRESSION: 1. Tiny subdural hematoma along the right cerebral convexity (3 mm in thickness). 2. Scalp hematoma without calvarial fracture. 3. Negative for cervical spine fracture. Electronically Signed   By: Marnee Spring M.D.   On: 04/13/2020 06:27   CT Cervical Spine Wo Contrast  Result Date: 04/13/2020 CLINICAL DATA:  Minor head trauma EXAM: CT HEAD WITHOUT CONTRAST CT CERVICAL SPINE WITHOUT CONTRAST TECHNIQUE: Multidetector CT imaging of the head and cervical spine was performed following the standard protocol without intravenous contrast. Multiplanar CT image reconstructions of the cervical spine were also generated. COMPARISON:  10/16/2018 brain MRI FINDINGS: CT HEAD FINDINGS Brain: 3 mm thick subdural hematoma along the right cerebral convexity which is seen on both axial and coronal images. No parenchymal or subarachnoid hemorrhage. No visible infarct or hydrocephalus. Small vessel ischemia in the cerebral white matter which is generalized. Vascular: No hyperdense vessel or unexpected calcification. Skull: High left scalp hematoma without calvarial fracture Sinuses/Orbits: Negative Critical Value/emergent results were called by telephone at the time of interpretation on 04/13/2020 at 6:26 am to provider [facility] , who verbally acknowledged these results. CT CERVICAL SPINE FINDINGS Alignment: No traumatic malalignment Skull base and vertebrae: Negative for acute fracture Soft tissues and spinal canal: No prevertebral fluid or swelling. No visible canal hematoma. Thyroidectomy. Disc levels: Multilevel disc and facet degenerative spurring. C5-6 ACDF for ankylosis. Upper chest: No evidence of injury  IMPRESSION: 1. Tiny subdural hematoma along the right cerebral convexity (3 mm in thickness). 2. Scalp hematoma without calvarial fracture. 3. Negative for cervical spine fracture. Electronically Signed   By: Marnee Spring M.D.   On: 04/13/2020 06:27    Procedures .Marland KitchenLaceration Repair  Date/Time: 04/13/2020 6:56 AM Performed by: Shon Baton, MD Authorized by: Shon Baton, MD   Consent:    Consent obtained:  Verbal   Consent given by:  Patient   Risks discussed:  Infection and pain Anesthesia:    Anesthesia method:  None Laceration details:    Location:  Scalp  Scalp location:  Frontal   Length (cm):  3   Depth (mm):  5 Exploration:    Hemostasis achieved with:  Direct pressure   Imaging outcome: foreign body not noted     Wound exploration: entire depth of wound visualized     Contaminated: no   Treatment:    Area cleansed with:  Povidone-iodine   Amount of cleaning:  Standard   Irrigation solution:  Sterile saline Skin repair:    Repair method:  Staples   Number of staples:  2 Approximation:    Approximation:  Close Repair type:    Repair type:  Simple Post-procedure details:    Dressing:  Bulky dressing   Procedure completion:  Tolerated   (including critical care time)  Medications Ordered in ED Medications - No data to display  ED Course  I have reviewed the triage vital signs and the nursing notes.  Pertinent labs & imaging results that were available during my care of the patient were reviewed by me and considered in my medical decision making (see chart for details).  Clinical Course as of 04/14/20 0136  Tue Apr 13, 2020  0653 Spoke with Dr. Joaquim Nam who will review imaging and develop plan. [CH]    Clinical Course User Index [CH] Khristopher Kapaun, Barbette Hair, MD   MDM Rules/Calculators/A&P                           Patient presents with head injury following a fall.  She reports she fell asleep.  She is on anticoagulants.  She is at her  baseline mental status and she is a nonfocal neuro exam.  ABCs are intact.  She has a large hematoma to the left forehead.  Will obtain head neck imaging given her age.  Hematoma laceration were repaired.  A compressive dressing was placed.  CT scan shows a small 3 mm countercoup subdural.  Again, she is neurologically intact.  Neurosurgery was consulted.  See clinical course above.  Patient signed out to oncoming provider pending neurosurgical plan.  No other obvious injury.  No other indication for imaging    Final Clinical Impression(s) / ED Diagnoses Final diagnoses:  Subdural hematoma ([place])  Hematoma of scalp, initial encounter  Laceration of scalp, initial encounter    Rx / DC Orders ED Discharge Orders    None       Luanna Weesner, Barbette Hair, MD 04/14/20 [phone]

## 2020-04-13 NOTE — ED Notes (Signed)
Pt returned from CT, family at bedside.

## 2020-04-13 NOTE — ED Notes (Signed)
Pt to CT via stretcher

## 2020-04-13 NOTE — ED Notes (Signed)
Went over D/C papers with daughter, all questions answered during discharge. Pt provided with clean shirt.

## 2020-04-13 NOTE — Consult Note (Signed)
Neurosurgery Consultation  Reason for Consult: Subdural hematoma Referring Physician: Long  CC: Fall  HPI: This is a 87 y.o. woman that presents after a fall forward out of a chair. No other known injuries at this time. They are currently in the emergency department still undergoing further evaluation. The patient is currently complaining of some wrist pain otherwise no complaints. They deny any new weakness, numbness, or paresthesias. No recent use of anti-platelet or anti-coagulant medications. Neurosurgery consulted regarding CT findings of a subdural hematoma.    ROS: A 14 point ROS was performed and is negative except as noted in the HPI   PMHx:  Past Medical History:  Diagnosis Date  . ALLERGIC RHINITIS 11/02/2006  . CKD (chronic kidney disease), stage III ([facility])   . HYPERLIPIDEMIA 11/04/2006  . HYPOTHYROIDISM, POSTSURGICAL 03/28/2007  . Malignant neoplasm of thyroid gland ([facility]) 03/28/2007   5/07: righte lobectomy--path shows 3 cm follicular ca 8/07: completion left lobectomy, no more tumor seen 09/07: thyroglobulin 0.2 9ab neg) 11/07: i-131 rx, 101 [facility] 6/08: thyroglogulin 0.2 (ab neg) , hypothyroid body scan neg (clinically tolerated hypothyroidism poorly) 06/09: tg undetectable (ab=1.5, normal) 12/10: tg undetectable (ab neg) 6/11: tg undectable (ab neg)  . OSTEOPENIA 11/02/2006   FamHx:  Family History  Problem Relation Age of Onset  . Diabetes Sister    SocHx:  reports that she has never smoked. She has never used smokeless tobacco. She reports previous alcohol use. She reports that she does not use drugs.  Exam: Vital signs in last 24 hours: Temp:  [97.8 F (36.6 C)] 97.8 F (36.6 C) (01/04 0516) Pulse Rate:  [79-91] 79 (01/04 0715) Resp:  [13-20] 16 (01/04 0715) BP: (148-178)/(82-107) 148/82 (01/04 0715) SpO2:  [91 %-99 %] 94 % (01/04 0715) Weight:  [60 kg] 60 kg (01/04 0520) General: Awake, alert, cooperative, lying in hospital bed Head: Normocephalic and head wrap  in place HEENT: Neck supple Pulmonary: breathing room air comfortably, no evidence of increased work of breathing Cardiac: RRR Abdomen: S NT ND Extremities: Warm and well perfused x4 Neuro: AOx3, gaze neutral, FS Strength 5/5 x4, SILTx4   Assessment and Plan: 87 y.o. woman s/p fall out of chair. CT head personally reviewed, which shows small right subdural hematoma.   -no acute neurosurgical intervention indicated at this time, okay for discharge from ED, no need for scheduled follow up with me unless pt has new concerns or questions  Jadene Pierini, MD 04/13/20 7:42 AM [facility]

## 2020-04-13 NOTE — Discharge Instructions (Signed)
You were seen in the ED today with fall and hit to the head. You have 2 staples repairing your laceration. These will need to be removed in 7 days either by your PCP or by returning to the ED for removal. Follow with your PCP this week to review your ED visit and test results. Return with any new or suddenly worsening symptoms.

## 2020-04-13 NOTE — ED Notes (Signed)
Neuro surgery at bedside.

## 2020-04-13 NOTE — ED Provider Notes (Signed)
Blood pressure (!) 148/82, pulse 79, temperature 97.8 F (36.6 C), temperature source Temporal, resp. rate 16, height 5\' 3"  (1.6 m), weight 60 kg, SpO2 94 %.  Assuming care from Dr. .  In short, [name] [name] is a 87 y.o. female with a chief complaint of Fall .  Refer to the original H&P for additional details.  The current plan of care is to f/u with NSG recommendations.  Spoke with Dr. 05-06-1970 after his ED evaluation. Patient can be discharged with PCP follow up plan. Will need staple removal in 7 days as well. Tetanus UTD.     Dolphus Jenny, MD 04/13/20 [phone]

## 2020-04-26 ENCOUNTER — Ambulatory Visit: Payer: PPO | Admitting: Family Medicine

## 2020-04-27 ENCOUNTER — Encounter: Payer: Self-pay | Admitting: Family Medicine

## 2020-04-27 ENCOUNTER — Ambulatory Visit (INDEPENDENT_AMBULATORY_CARE_PROVIDER_SITE_OTHER): Payer: PPO | Admitting: Family Medicine

## 2020-04-27 ENCOUNTER — Other Ambulatory Visit: Payer: Self-pay

## 2020-04-27 VITALS — BP 161/80 | HR 86 | Temp 98.1°F | Ht 63.0 in | Wt 134.4 lb

## 2020-04-27 DIAGNOSIS — S065XAA Traumatic subdural hemorrhage with loss of consciousness status unknown, initial encounter: Secondary | ICD-10-CM

## 2020-04-27 DIAGNOSIS — S0101XD Laceration without foreign body of scalp, subsequent encounter: Secondary | ICD-10-CM

## 2020-04-27 DIAGNOSIS — S065X9A Traumatic subdural hemorrhage with loss of consciousness of unspecified duration, initial encounter: Secondary | ICD-10-CM

## 2020-04-27 DIAGNOSIS — Z23 Encounter for immunization: Secondary | ICD-10-CM | POA: Diagnosis not present

## 2020-04-27 DIAGNOSIS — L84 Corns and callosities: Secondary | ICD-10-CM | POA: Diagnosis not present

## 2020-04-27 NOTE — Assessment & Plan Note (Signed)
Will place referral to podiatry 

## 2020-04-27 NOTE — Patient Instructions (Signed)
It was very nice to see you today!  We took out your staples today and give you a flu vaccine.  I will place a referral for you to see the foot doctor.  No other changes today.  Take care, Dr Jerline Pain  Please try these tips to maintain a healthy lifestyle:   Eat at least 3 REAL meals and 1-2 snacks per day.  Aim for no more than 5 hours between eating.  If you eat breakfast, please do so within one hour of getting up.    Each meal should contain half fruits/vegetables, one quarter protein, and one quarter carbs (no bigger than a computer mouse)   Cut down on sweet beverages. This includes juice, soda, and sweet tea.     Drink at least 1 glass of water with each meal and aim for at least 8 glasses per day   Exercise at least 150 minutes every week.

## 2020-04-27 NOTE — Progress Notes (Signed)
   [name] [name] is a 87 y.o. female who presents today for an office visit.  Assessment/Plan:  New/Acute Problems: Subdural Hematoma / Scalp Laceration Staples removed today without difficulty.  No red flag signs or symptoms.  Reassuring exam.  Discussed reasons to return to care.  Per neurosurgery does not need further management at this point.  Chronic Problems Addressed Today: Corns and callus Will place referral to podiatry.  Flu vaccine given today.    Subjective:  HPI:  Patient here for ED follow-up.  Went to the ED 14 days ago with a fall.  Fell asleep in a chair and feel forward into a stool. Hit her head and noticed immediate bleeding.  Went to the ED.  CT scan showed small 3 mm subdural.  Neurology was consulted.  Felt like she was safe for discharge home.  Had scalp laceration repaired via 2 staples.  She has done well since being home.  No further episodes of headache, nausea, vomiting, vision changes, weakness, or numbness.  She is overall doing well.  See A/P for status of chronic conditions.       Objective:  Physical Exam: BP (!) 161/80   Pulse 86   Temp 98.1 F (36.7 C) (Temporal)   Ht 5\' 3"  (1.6 m)   Wt 134 lb 6.4 oz (61 kg)   SpO2 96%   BMI 23.81 kg/m   Gen: No acute distress, resting comfortably CV: Regular rate and rhythm with no murmurs appreciated Pulm: Normal work of breathing, clear to auscultation bilaterally with no crackles, wheezes, or rhonchi Skin: Well-healed laceration on left anterior scalp with 2 staples in place.  No surrounding erythema. MSK: Bilateral feet with hyperkeratotic lesions Neuro: Grossly normal, moves all extremities Psych: Normal affect and thought content  Verbal consent obtained.  Staples were removed with staple removal without difficulty.  No bleeding.  No [name].  Time Spent: 35 minutes of total time was spent on the date of the encounter performing the following actions: chart review prior to seeing the patient  including recent ED visit, obtaining history, performing a medically necessary exam, counseling on the treatment plan, placing orders, and documenting in our EHR.        [name] [name]. [name] Pain, MD 04/27/2020 2:37 PM

## 2020-05-11 ENCOUNTER — Ambulatory Visit: Payer: PPO | Admitting: Podiatry

## 2020-05-11 ENCOUNTER — Other Ambulatory Visit: Payer: Self-pay

## 2020-05-11 ENCOUNTER — Encounter: Payer: Self-pay | Admitting: Podiatry

## 2020-05-11 DIAGNOSIS — M79675 Pain in left toe(s): Secondary | ICD-10-CM

## 2020-05-11 DIAGNOSIS — M79674 Pain in right toe(s): Secondary | ICD-10-CM | POA: Diagnosis not present

## 2020-05-11 DIAGNOSIS — B351 Tinea unguium: Secondary | ICD-10-CM | POA: Diagnosis not present

## 2020-05-11 DIAGNOSIS — L84 Corns and callosities: Secondary | ICD-10-CM | POA: Diagnosis not present

## 2020-05-11 DIAGNOSIS — R52 Pain, unspecified: Secondary | ICD-10-CM

## 2020-05-11 NOTE — Patient Instructions (Signed)
Look for urea 40% cream or ointment and apply to the thickened dry skin / calluses. This can be bought over the counter, at a pharmacy or online such as [employer].  

## 2020-05-11 NOTE — Progress Notes (Signed)
  Subjective:  Patient ID: [name] [name], female    DOB: Aug 18, 1927,  MRN: [id number]  Chief Complaint  Patient presents with  . routine foot care    Nail trim and bilateral corns     87 y.o. female presents with the above complaint. History confirmed with patient.   Objective:  Physical Exam: warm, good capillary refill, no trophic changes or ulcerative lesions, normal DP and PT pulses and normal sensory exam.  Varicose veins noted.  Thickened, elongated and dystrophic nail plates with incurvated borders x10.  Severe hyperkeratosis protruding from the skin submet five bilaterally with an additional lesion in the plantar heel  Assessment:   1. Onychomycosis   2. Pain due to onychomycosis of toenails of both feet   3. Callus of foot   4. Pain      Plan:  Patient was evaluated and treated and all questions answered.  Discussed the etiology and treatment options for the condition in detail with the patient. Educated patient on the topical and oral treatment options for mycotic nails. Recommended debridement of the nails today. Sharp and mechanical debridement performed of all painful and mycotic nails today. Nails debrided in length and thickness using a nail nipper to level of comfort. Discussed treatment options including appropriate shoe gear. Follow up as needed for painful nails.  All symptomatic hyperkeratoses were safely debrided with a sterile #15 blade to patient's level of comfort without incident. We discussed preventative and palliative care of these lesions including supportive and accommodative shoegear, padding, prefabricated and custom molded accommodative orthoses, use of a pumice stone and lotions/creams daily.  Offloading pads placed in her shoes  Return in about 3 months (around 08/08/2020).

## 2020-05-18 ENCOUNTER — Other Ambulatory Visit: Payer: Self-pay | Admitting: Endocrinology

## 2020-06-28 ENCOUNTER — Other Ambulatory Visit: Payer: Self-pay | Admitting: Endocrinology

## 2020-06-29 ENCOUNTER — Telehealth: Payer: Self-pay | Admitting: Family Medicine

## 2020-06-29 NOTE — Telephone Encounter (Signed)
Left message for patient to call back and schedule Medicare Annual Wellness Visit (AWV) either virtually OR in office.   No hx; please schedule at anytime with [facility]-Nurse Health Advisor at [facility].  This should be a 45 minute visit.   

## 2020-07-13 ENCOUNTER — Other Ambulatory Visit: Payer: Self-pay

## 2020-07-13 ENCOUNTER — Ambulatory Visit: Payer: PPO | Admitting: Endocrinology

## 2020-07-13 VITALS — BP 160/78 | HR 67 | Ht 63.0 in | Wt 131.2 lb

## 2020-07-13 DIAGNOSIS — E208 Other hypoparathyroidism: Secondary | ICD-10-CM

## 2020-07-13 DIAGNOSIS — E890 Postprocedural hypothyroidism: Secondary | ICD-10-CM

## 2020-07-13 DIAGNOSIS — C73 Malignant neoplasm of thyroid gland: Secondary | ICD-10-CM | POA: Diagnosis not present

## 2020-07-13 LAB — T4, FREE: Free T4: 1.55 ng/dL (ref 0.60–1.60)

## 2020-07-13 LAB — VITAMIN D 25 HYDROXY (VIT D DEFICIENCY, FRACTURES): VITD: 44.7 ng/mL (ref 30.00–100.00)

## 2020-07-13 LAB — TSH: TSH: 0.3 u[IU]/mL — ABNORMAL LOW (ref 0.35–4.50)

## 2020-07-13 NOTE — Patient Instructions (Addendum)
Your blood pressure is high today.  Please see your primary care provider soon, to have it rechecked Blood tests are requested for you today.  We'll let you know about the results.   Please come back for a follow-up appointment in 6 months.   

## 2020-07-13 NOTE — Progress Notes (Signed)
Subjective:    Patient ID: [name] [name], female    DOB: Mar 11, 1928, 87 y.o.   MRN: [id number]  HPI Pt returns for f/u of Stage-2 FTC:    5/07:  right lobectomy--path shows 3 cm right lobe follicular ca (T2 N0 M0).   8/07: completion left lobectomy, no more tumor seen.   9/07  thyroglobulin 11.8 (ab neg).   11/07: RAI, 101 mCi 6/08 thyroglogulin 0.2 (ab neg).   6/08: hypothyroid body scan neg (she tolerated thyroid hormone withdrawal poorly).   6/09 tg undetectable (ab=1.5) 12/10: tg undetectable (ab neg) 6/11:  tg undetectable (ab neg) 6/12: tg undetectable (ab neg) 6/13: tg undetectable (ab neg) 6/15: tg undetectable (ab neg) 6/16: tg undetectable (ab neg).   8/17: tg=0.1 (ab neg).  2/19: tg undetectable (ab neg). 5/20: tg undetectable (ab neg).  3/21 tg undetectable (ab neg).  She denies any nodule at the neck.  Postsurgical hypothyroidism (due to SVT, she is not a candidate for TSH suppression): she takes synthroid as rx'ed.  Postsurgical hypoparathyroidism (she takes rocaltrol; due to SVT, goal Ca++ in normal range).   Pt says she takes rocaltrol as rx'ed.  She has not recently taken Vit-D.  Past Medical History:  Diagnosis Date  . ALLERGIC RHINITIS 11/02/2006  . CKD (chronic kidney disease), stage III ([place])   . HYPERLIPIDEMIA 11/04/2006  . HYPOTHYROIDISM, POSTSURGICAL 03/28/2007  . Malignant neoplasm of thyroid gland ([place]) 03/28/2007   5/07: righte lobectomy--path shows 3 cm follicular ca 5/03: completion left lobectomy, no more tumor seen 09/07: thyroglobulin 0.2 9ab neg) 11/07: i-131 rx, 101 mci 6/08: thyroglogulin 0.2 (ab neg) , hypothyroid body scan neg (clinically tolerated hypothyroidism poorly) 06/09: tg undetectable (ab=1.5, normal) 12/10: tg undetectable (ab neg) 6/11: tg undectable (ab neg)  . OSTEOPENIA 11/02/2006    Past Surgical History:  Procedure Laterality Date  . ABDOMINAL HYSTERECTOMY    . CERVICAL POLYPECTOMY    . OPEN REDUCTION INTERNAL FIXATION  (ORIF) DISTAL RADIAL FRACTURE Left 01/14/2014   Procedure: OPEN REDUCTION INTERNAL FIXATION DISTAL RADIAL FRACTURE;  Surgeon: Linna Hoff, MD;  Location: [facility];  Service: Orthopedics;  Laterality: Left;    Social History   Socioeconomic History  . Marital status: Widowed    Spouse name: Not on file  . Number of children: Not on file  . Years of education: Not on file  . Highest education level: Not on file  Occupational History  . Not on file  Tobacco Use  . Smoking status: Never Smoker  . Smokeless tobacco: Never Used  Substance and Sexual Activity  . Alcohol use: Not Currently  . Drug use: Never  . Sexual activity: Not on file  Other Topics Concern  . Not on file  Social History Narrative   Doesn't drive   Social Determinants of Health   Financial Resource Strain: Not on file  Food Insecurity: Not on file  Transportation Needs: Not on file  Physical Activity: Not on file  Stress: Not on file  Social Connections: Not on file  Intimate Partner Violence: Not on file    Current Outpatient Medications on File Prior to Visit  Medication Sig Dispense Refill  . azelastine (ASTELIN) 0.1 % nasal spray Place 2 sprays into both nostrils 2 (two) times daily. 30 mL 12  . benzonatate (TESSALON) 200 MG capsule Take 1 capsule (200 mg total) by mouth 2 (two) times daily as needed for cough. 30 capsule 0  . calcitRIOL (ROCALTROL) 0.25 MCG capsule Take 1  capsule by mouth once daily 30 capsule 0  . calcium carbonate (OS-CAL) 600 MG TABS Take 600 mg by mouth at bedtime.    . docusate sodium (COLACE) 100 MG capsule Take 100 mg by mouth at bedtime.     . Menthol-Ascorbic Acid (LUDENS COUGH DROPS MT) Use as directed 1 drop in the mouth or throat as needed (dry throat).    . Multiple Vitamins-Minerals (MULTIVITAMINS THER. W/MINERALS) TABS Take 1 tablet by mouth daily.    . polyethylene glycol powder (GLYCOLAX/MIRALAX) 17 GM/SCOOP powder Take 4.25-8.5 g by mouth at bedtime.    . triamcinolone  ointment (KENALOG) 0.5 % Apply 1 application topically 2 (two) times daily. 30 g 0  . valACYclovir (VALTREX) 1000 MG tablet Take 1 tablet (1,000 mg total) by mouth daily. Take 1 pill daily for 5 days for outbreaks. 30 tablet 1  . verapamil (CALAN-SR) 120 MG CR tablet Take 1 tablet by mouth once daily 90 tablet 2  . vitamin C (ASCORBIC ACID) 500 MG tablet Take 1 tablet (500 mg total) by mouth daily. 50 tablet 0   No current facility-administered medications on file prior to visit.    No Known Allergies  Family History  Problem Relation Age of Onset  . Diabetes Sister     BP (!) 160/78 (BP Location: Right Arm, Patient Position: Sitting, Cuff Size: Normal)   Pulse 67   Ht 5' 3"  (1.6 m)   Wt 131 lb 3.2 oz (59.5 kg)   SpO2 98%   BMI 23.24 kg/m     Review of Systems     Objective:   Physical Exam VITAL SIGNS:  See vs page GENERAL: no distress Neck: a healed scar is present.  I do not appreciate a nodule in the thyroid or elsewhere in the neck.     Lab Results  Component Value Date   TSH 0.30 (L) 07/13/2020      Assessment & Plan:  Hypothyroidism: overcontrolled.  I have sent a prescription to your pharmacy, to decrease the levothyroxine PTC: recheck today Hypoparathyroidism: recheck today

## 2020-07-14 LAB — PTH, INTACT AND CALCIUM
Calcium: 8.8 mg/dL (ref 8.6–10.4)
PTH: 13 pg/mL — ABNORMAL LOW (ref 16–77)

## 2020-07-14 LAB — THYROGLOBULIN LEVEL: Thyroglobulin: <0.1 ng/mL — ABNORMAL LOW

## 2020-07-14 LAB — THYROGLOBULIN ANTIBODY: Thyroglobulin Ab: <1 IU/mL (ref <=1)

## 2020-07-14 MED ORDER — LEVOTHYROXINE SODIUM 112 MCG PO TABS: 112.0000 ug | ORAL_TABLET | Freq: Every day | ORAL | 3 refills | Status: DC

## 2020-08-04 ENCOUNTER — Other Ambulatory Visit: Payer: Self-pay | Admitting: Endocrinology

## 2020-08-07 IMAGING — CT CT ANGIOGRAPHY CHEST
2 of 7 series · 18 of 46 positions shown · IV contrast (APPLIED)
Comparison: Chest radiograph yesterday.

CLINICAL DATA: PE suspected, high pretest prob

EXAM:
CT ANGIOGRAPHY CHEST WITH CONTRAST
TECHNIQUE: Multidetector CT imaging of the chest was performed using the
standard protocol during bolus administration of intravenous
contrast. Multiplanar CT image reconstructions and MIPs were
obtained to evaluate the vascular anatomy.
CONTRAST:  60mL OMNIPAQUE IOHEXOL 350 MG/ML SOLN

[Series 9: thins · axial · 0.72mm/px · z∈[+995,+1268]mm · 15 of 441 slices shown]
[im 25/441  lung]
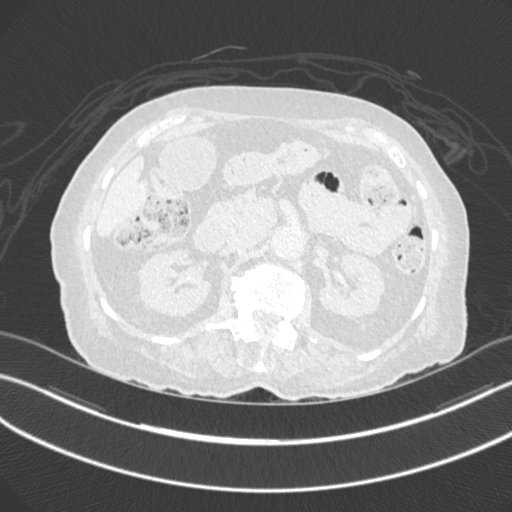
[im 49/441  soft-tissue]
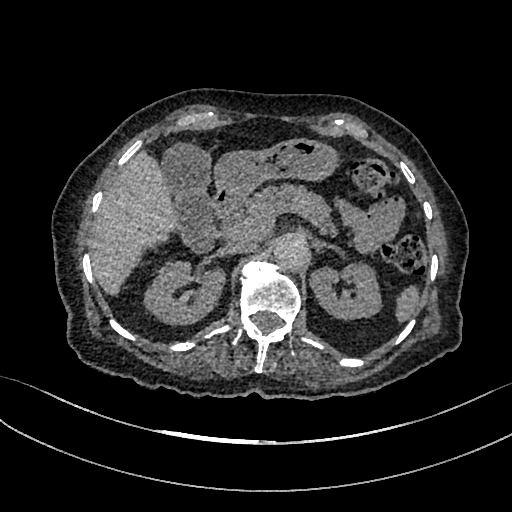
[im 74/441  lung]
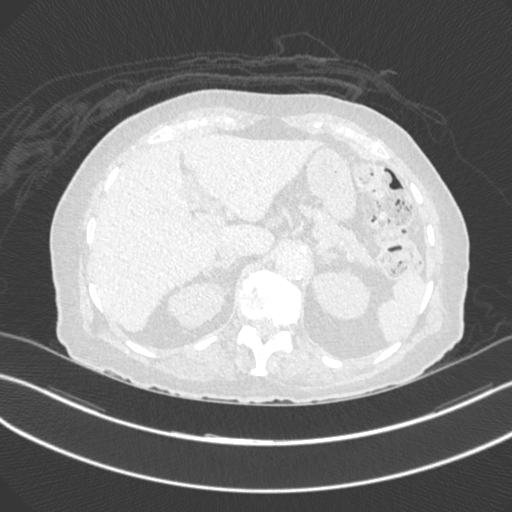
[im 98/441  soft-tissue]
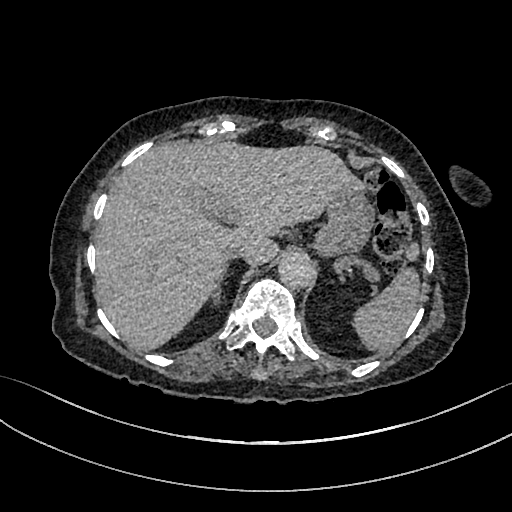
[im 147/441  lung]
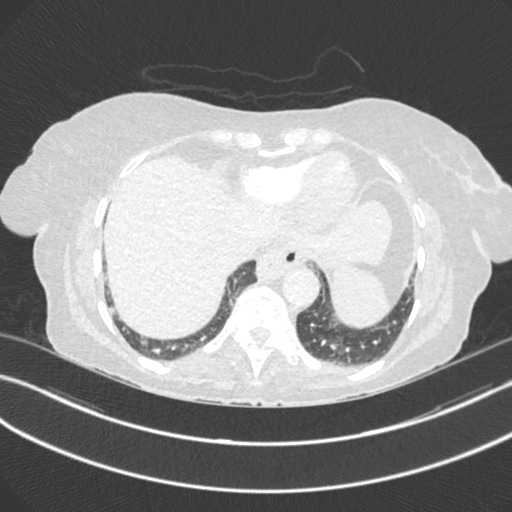
[im 172/441  soft-tissue]
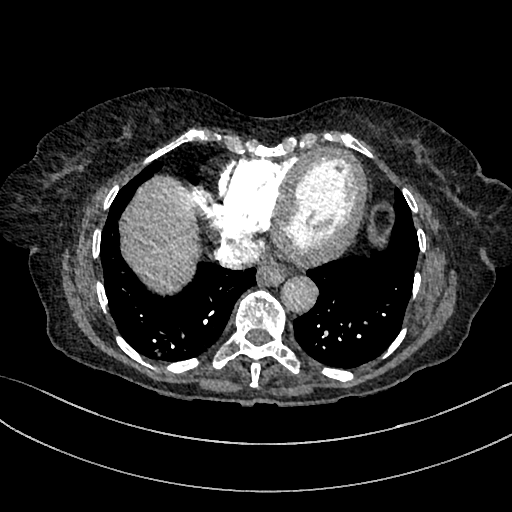
[im 196/441  lung]
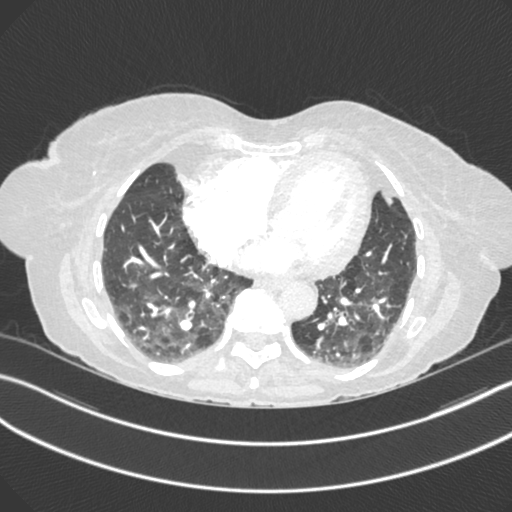
[im 221/441  soft-tissue]
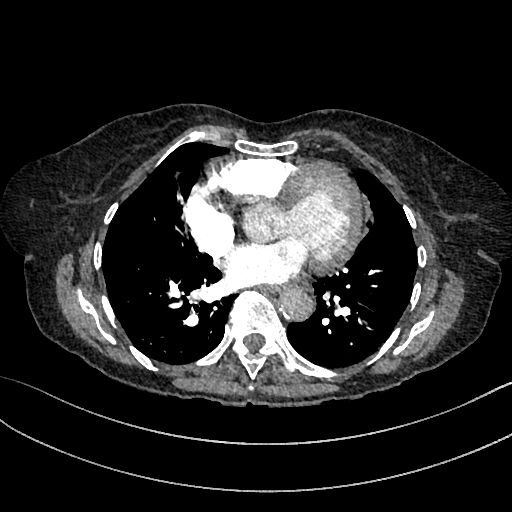
[im 245/441  lung]
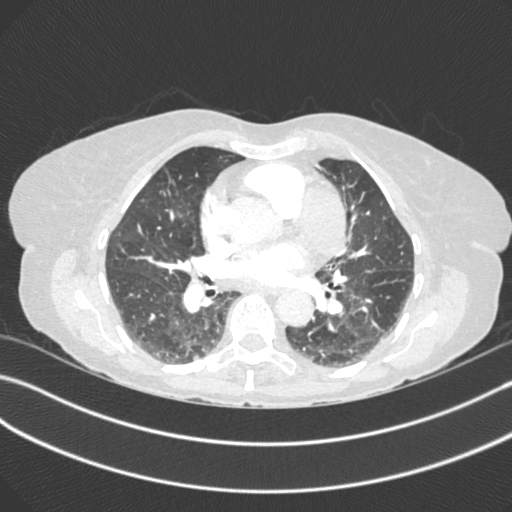
[im 269/441  soft-tissue]
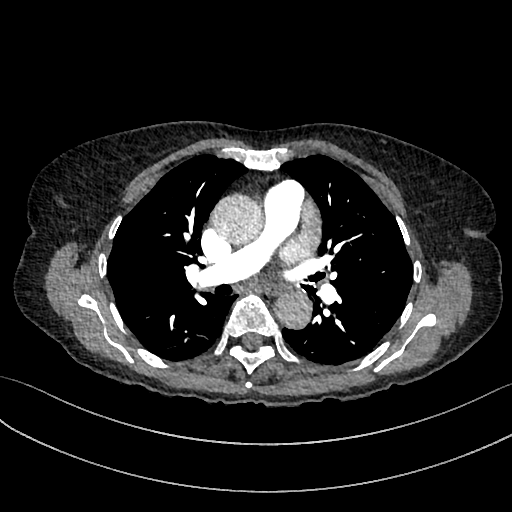
[im 294/441  lung]
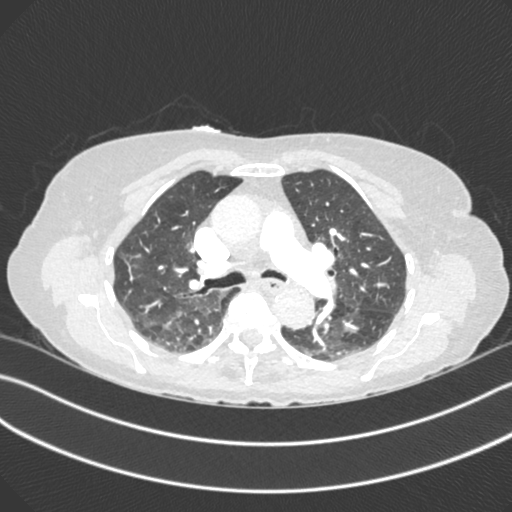
[im 343/441  soft-tissue]
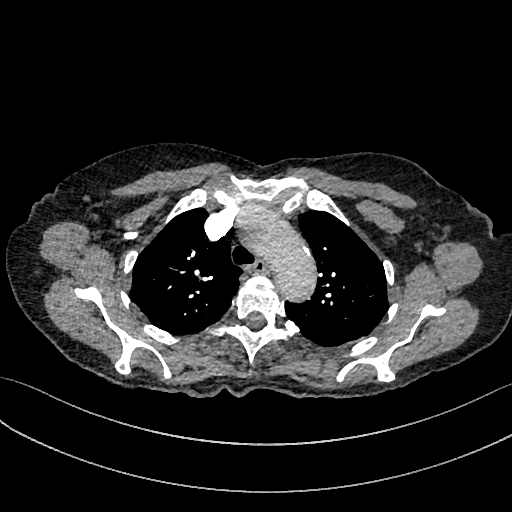
[im 367/441  lung]
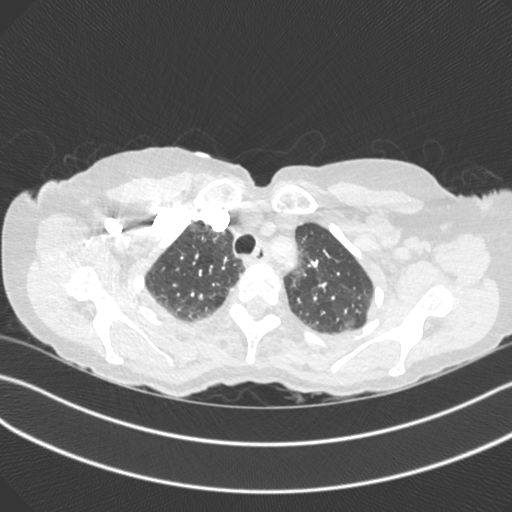
[im 392/441  soft-tissue]
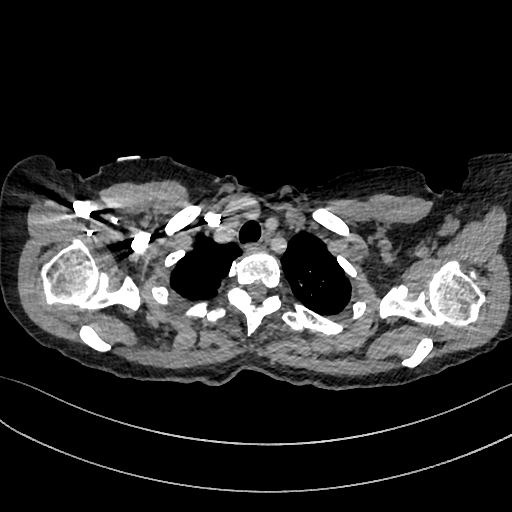
[im 416/441  lung]
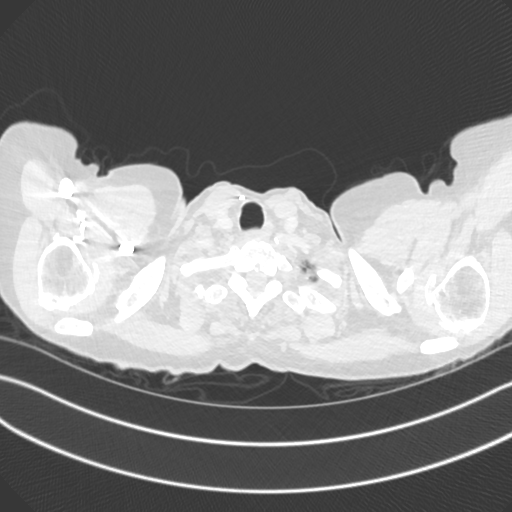

[Series 10: cor · coronal · 0.66mm/px · 3 of 131 slices shown]
[im 33/131  soft-tissue]
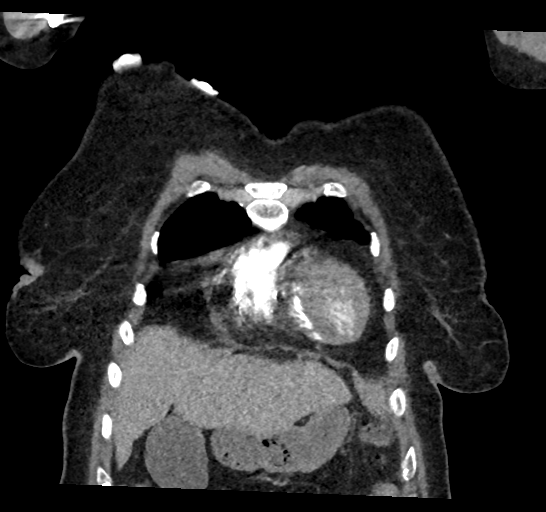
[im 66/131  soft-tissue]
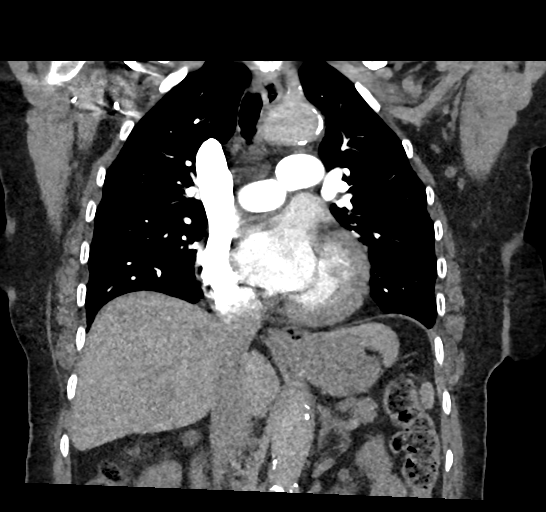
[im 98/131  soft-tissue]
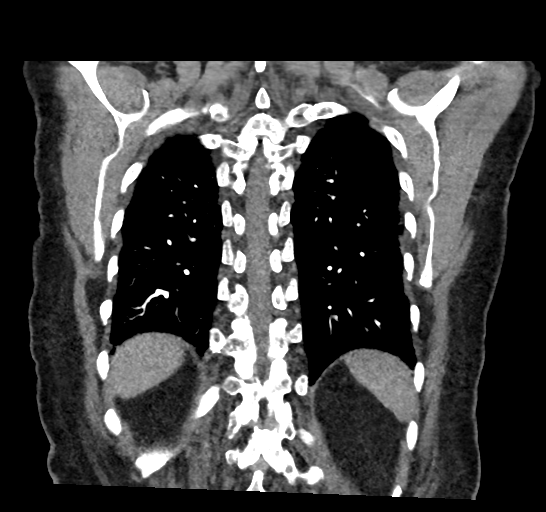

[18 of 46 positions shown; findings below may reference images not displayed]

FINDINGS: Cardiovascular: There are no filling defects within the pulmonary
arteries to suggest pulmonary embolus. Aortic atherosclerosis and
tortuosity. No periaortic stranding. Mild cardiomegaly. No
pericardial effusion.

Mediastinum/Nodes: No enlarged mediastinal or hilar lymph nodes.
Small hiatal hernia. Esophagus otherwise decompressed. Prior
thyroidectomy.

Lungs/Pleura: Mild atelectasis in the paramediastinal right middle
lobe and lingula. Heterogeneous pulmonary parenchyma suggesting
small airways disease. No focal airspace disease to suggest
pneumonia. No evidence of pulmonary edema. No pleural fluid. Trachea
and mainstem bronchi are patent. 6 mm right lower lobe subpleural
pulmonary nodule, image 96 series 8. Perifissural 4 mm nodule
superior segment left lower lobe image 39 series 8.

Upper Abdomen: No acute abnormality.

Musculoskeletal: There are no acute or suspicious osseous
abnormalities. Mild for age degenerative change in the spine.
Degenerative change of both shoulders.

Review of the MIP images confirms the above findings.
IMPRESSION: 1. Negative for pulmonary embolus.
2. Heterogeneous pulmonary parenchyma suggesting small airways
disease. No focal airspace disease to suggest pneumonia.
3. Mild cardiomegaly.  Small hiatal hernia.
4. Small bilateral pulmonary nodules largest measuring 6 mm in the
right lower lobe. Non-contrast chest CT at 3-6 months is
recommended. If the nodules are stable at time of repeat CT, then
future CT at 18-24 months (from today's scan) is considered optional
for low-risk patients, but is recommended for high-risk patients.
This recommendation follows the consensus statement: Guidelines for
Management of Incidental Pulmonary Nodules Detected on CT Images:

Aortic Atherosclerosis (FE9V8-FO7.7).

## 2020-08-18 ENCOUNTER — Ambulatory Visit: Payer: PPO | Admitting: Podiatry

## 2020-08-18 ENCOUNTER — Encounter: Payer: Self-pay | Admitting: Podiatry

## 2020-08-18 ENCOUNTER — Other Ambulatory Visit: Payer: Self-pay

## 2020-08-18 DIAGNOSIS — M79675 Pain in left toe(s): Secondary | ICD-10-CM | POA: Diagnosis not present

## 2020-08-18 DIAGNOSIS — B351 Tinea unguium: Secondary | ICD-10-CM

## 2020-08-18 DIAGNOSIS — M79674 Pain in right toe(s): Secondary | ICD-10-CM | POA: Diagnosis not present

## 2020-08-18 DIAGNOSIS — L84 Corns and callosities: Secondary | ICD-10-CM

## 2020-08-18 NOTE — Progress Notes (Signed)
This patient returns to my office for at risk foot care.  This patient requires this care by a professional since this patient will be at risk due to having CKD.   This patient is unable to cut nails herself since the patient cannot reach her nails.These nails are painful walking and wearing shoes.  This patient presents for at risk foot care today.  General Appearance  Alert, conversant and in no acute stress.  Vascular  Dorsalis pedis and posterior tibial  pulses are weakly  palpable  bilaterally.  Capillary return is within normal limits  bilaterally. Cold feet.    Bilaterally. Absent digital toenails.  Neurologic  Senn-Weinstein monofilament wire test within normal limits  bilaterally. Muscle power within normal limits bilaterally.  Nails Thick disfigured discolored nails with subungual debris  from hallux to fifth toes bilaterally. No evidence of bacterial infection or drainage bilaterally.  Orthopedic  No limitations of motion  feet .  No crepitus or effusions noted.  No bony pathology or digital deformities noted.  Skin  normotropic skin  noted bilaterally.  No signs of infections or ulcers noted.   Porokeratosis sub 5th  B/L.  Onychomycosis  Pain in right toes  Pain in left toes  Porokeratosis  B/L.  Consent was obtained for treatment procedures.   Mechanical debridement of nails 1-5  bilaterally performed with a nail nipper.  Filed with dremel without incident. Debride porokeratosis with # 15 blade.   Return office visit    3 months                  Told patient to return for periodic foot care and evaluation due to potential at risk complications.   Gardiner Barefoot DPM

## 2020-10-04 ENCOUNTER — Other Ambulatory Visit: Payer: Self-pay | Admitting: Endocrinology

## 2020-11-11 ENCOUNTER — Other Ambulatory Visit: Payer: Self-pay | Admitting: Endocrinology

## 2020-11-24 ENCOUNTER — Ambulatory Visit: Payer: PPO | Admitting: Podiatry

## 2020-11-24 ENCOUNTER — Other Ambulatory Visit: Payer: Self-pay

## 2020-11-24 ENCOUNTER — Encounter: Payer: Self-pay | Admitting: Podiatry

## 2020-11-24 DIAGNOSIS — M79675 Pain in left toe(s): Secondary | ICD-10-CM

## 2020-11-24 DIAGNOSIS — M216X1 Other acquired deformities of right foot: Secondary | ICD-10-CM

## 2020-11-24 DIAGNOSIS — M79674 Pain in right toe(s): Secondary | ICD-10-CM

## 2020-11-24 DIAGNOSIS — B351 Tinea unguium: Secondary | ICD-10-CM

## 2020-11-24 DIAGNOSIS — L84 Corns and callosities: Secondary | ICD-10-CM

## 2020-11-24 DIAGNOSIS — M216X2 Other acquired deformities of left foot: Secondary | ICD-10-CM

## 2020-11-24 NOTE — Progress Notes (Signed)
This patient returns to my office for at risk foot care.  This patient requires this care by a professional since this patient will be at risk due to having CKD.   This patient is unable to cut nails herself since the patient cannot reach her nails.These nails are painful walking and wearing shoes.  Patient presents to the office with her daughter. This patient presents for at risk foot care today.  General Appearance  Alert, conversant and in no acute stress.  Vascular  Dorsalis pedis and posterior tibial  pulses are weakly  palpable  bilaterally.  Capillary return is within normal limits  bilaterally. Cold feet.    Bilaterally. Absent digital toenails.  Neurologic  Senn-Weinstein monofilament wire test within normal limits  bilaterally. Muscle power within normal limits bilaterally.  Nails Thick disfigured discolored nails with subungual debris  from hallux to fifth toes bilaterally. No evidence of bacterial infection or drainage bilaterally.  Orthopedic  No limitations of motion  feet .  No crepitus or effusions noted.  No bony pathology or digital deformities noted.  Plantar flexed fifth metatarsal  B/L.  Skin  normotropic skin  noted bilaterally.  No signs of infections or ulcers noted.   Porokeratosis sub 5th  B/L.  Onychomycosis  Pain in right toes  Pain in left toes  Porokeratosis  B/L.  Consent was obtained for treatment procedures.   Mechanical debridement of nails 1-5  bilaterally performed with a nail nipper.  Filed with dremel without incident. Debride porokeratosis with # 15 blade.   Return office visit    3 months                  Told patient to return for periodic foot care and evaluation due to potential at risk complications.   Gardiner Barefoot DPM

## 2020-12-01 ENCOUNTER — Encounter: Payer: Self-pay | Admitting: Family Medicine

## 2020-12-01 ENCOUNTER — Other Ambulatory Visit: Payer: Self-pay

## 2020-12-01 ENCOUNTER — Other Ambulatory Visit: Payer: Self-pay | Admitting: *Deleted

## 2020-12-01 ENCOUNTER — Ambulatory Visit (INDEPENDENT_AMBULATORY_CARE_PROVIDER_SITE_OTHER): Payer: PPO | Admitting: Family Medicine

## 2020-12-01 VITALS — BP 126/82 | HR 89 | Temp 97.3°F | Resp 97 | Ht 63.0 in | Wt 130.0 lb

## 2020-12-01 DIAGNOSIS — E890 Postprocedural hypothyroidism: Secondary | ICD-10-CM | POA: Diagnosis not present

## 2020-12-01 DIAGNOSIS — R600 Localized edema: Secondary | ICD-10-CM | POA: Diagnosis not present

## 2020-12-01 DIAGNOSIS — J309 Allergic rhinitis, unspecified: Secondary | ICD-10-CM

## 2020-12-01 DIAGNOSIS — B029 Zoster without complications: Secondary | ICD-10-CM

## 2020-12-01 DIAGNOSIS — S065X9A Traumatic subdural hemorrhage with loss of consciousness of unspecified duration, initial encounter: Secondary | ICD-10-CM

## 2020-12-01 DIAGNOSIS — S065XAA Traumatic subdural hemorrhage with loss of consciousness status unknown, initial encounter: Secondary | ICD-10-CM

## 2020-12-01 LAB — COMPREHENSIVE METABOLIC PANEL
ALT: 36 U/L — ABNORMAL HIGH (ref 0–35)
AST: 42 U/L — ABNORMAL HIGH (ref 0–37)
Albumin: 3.4 g/dL — ABNORMAL LOW (ref 3.5–5.2)
Alkaline Phosphatase: 51 U/L (ref 39–117)
BUN: 18 mg/dL (ref 6–23)
CO2: 28 mEq/L (ref 19–32)
Calcium: 7.4 mg/dL — ABNORMAL LOW (ref 8.4–10.5)
Chloride: 101 mEq/L (ref 96–112)
Creatinine, Ser: 0.96 mg/dL (ref 0.40–1.20)
GFR: 51.93 mL/min — ABNORMAL LOW (ref >60.00)
Glucose, Bld: 87 mg/dL (ref 70–99)
Potassium: 4.4 mEq/L (ref 3.5–5.1)
Sodium: 139 mEq/L (ref 135–145)
Total Bilirubin: 0.7 mg/dL (ref 0.2–1.2)
Total Protein: 7 g/dL (ref 6.0–8.3)

## 2020-12-01 LAB — CBC
HCT: 35.6 % — ABNORMAL LOW (ref 36.0–46.0)
Hemoglobin: 11.3 g/dL — ABNORMAL LOW (ref 12.0–15.0)
MCHC: 31.8 g/dL (ref 30.0–36.0)
MCV: 81.4 fl (ref 78.0–100.0)
Platelets: 327 10*3/uL (ref 150.0–400.0)
RBC: 4.37 Mil/uL (ref 3.87–5.11)
RDW: 14.6 % (ref 11.5–15.5)
WBC: 5.6 10*3/uL (ref 4.0–10.5)

## 2020-12-01 LAB — TSH: TSH: 1.4 u[IU]/mL (ref 0.35–5.50)

## 2020-12-01 MED ORDER — VALACYCLOVIR HCL 1 G PO TABS: 1000.0000 mg | ORAL_TABLET | Freq: Every day | ORAL | 1 refills | Status: DC

## 2020-12-01 NOTE — Assessment & Plan Note (Signed)
Still not well controlled.  She did not feel like Astelin helped very much.  Recommended over-the-counter intranasal steroid spray such as Flonase.

## 2020-12-01 NOTE — Progress Notes (Signed)
Please inform patient of the following:  Blood counts dropped a bit but otherwise her labs are stable.  Would like for her to come back in a week or 2 to recheck CBC, ferritin, iron panel, and B12.

## 2020-12-01 NOTE — Assessment & Plan Note (Signed)
Likely venous insufficiency.  She is on verapamil which was also likely contributing.  We will check labs today to rule out other possible causes.  Discussed conservative management including leg elevation, compression, and salt avoidance.  Advised to discuss this with her cardiologist to see if switching off of verapamil will be an option next time she sees them.

## 2020-12-01 NOTE — Progress Notes (Signed)
   [name] [name] is a 87 y.o. female who presents today for an office visit.  Assessment/Plan:  New/Acute Problems: Skin related Secondary to edema.  No signs of infection today.  Discussed wound care.  Routine healing without further complication.  They will let me know if any other issues arise.  Chronic Problems Addressed Today: HYPOTHYROIDISM, POSTSURGICAL Managed by endocrinology.  Check TSH.  Shingles Acute flare.  Start Valtrex 1000 mg daily for 5 days.  She will let me know if not improving.  Allergic rhinitis Still not well controlled.  She did not feel like Astelin helped very much.  Recommended over-the-counter intranasal steroid spray such as Flonase.  Leg edema Likely venous insufficiency.  She is on verapamil which was also likely contributing.  We will check labs today to rule out other possible causes.  Discussed conservative management including leg elevation, compression, and salt avoidance.  Advised to discuss this with her cardiologist to see if switching off of verapamil will be an option next time she sees them.     Subjective:  HPI:  Patient here with wound to left lower extremity.  She has been having more swelling to the area.  This worsened after having done yard work, She then noticeds "like water running from her leg" .  Bleeding stopped.  She still has quite a bit of swelling in both legs.  No fevers or chills.  No [name] to the area.  Additionally, she has concerns about Shingles on her lower back, which started some time last week.  She has had this in the past.  Consistent with prior outbreaks.  Also, she believes she has issues regarding significant sinus drainage, as well as an issue with coughing when trying to sleep.           Objective:  Physical Exam: BP 126/82   Pulse 89   Temp (!) 97.3 F (36.3 C) (Temporal)   Resp (!) 97   Ht '5\' 3"'$  (1.6 m)   Wt 130 lb (59 kg)   BMI 23.03 kg/m   Gen: No acute distress, resting comfortably CV:  Regular rate and rhythm with no murmurs appreciated Pulm: Normal work of breathing, clear to auscultation bilaterally with no crackles, wheezes, or rhonchi SKin: Open wound approximately 1 cm in length on left anterior shin. MSK: 2+ pitting edema in bilateral lower extremities.  Signs  of chronic venous stasis changes noted. Neuro: Grossly normal, moves all extremities Psych: Normal affect and thought content      I,[name] [name],acting as a scribe for [name] Chyle, MD.,have documented all relevant documentation on the behalf of [name] Chyle, MD,as directed by  [name] Chyle, MD while in the presence of [name] Chyle, MD.  I, [name] Chyle, MD, have reviewed all documentation for this visit. The documentation on 12/01/20 for the exam, diagnosis, procedures, and orders are all accurate and complete.  [name] [name]. [name] Pain, MD 12/01/2020 8:31 AM

## 2020-12-01 NOTE — Patient Instructions (Signed)
It was very nice to see you today!  I think the wound on your leg will heal normally.  Please keep it clean and dry for the next 1 to 2 weeks.  It is important we will try to reduce the swelling in your legs.  Please avoid salt, keep your legs elevated, and use compression stockings as needed.  We will check blood work to make sure there is nothing else that could be causing the swelling.  I will send in a refill for the Valtrex.  This should treat the outbreak of shingles on your back.  It is possible the verapamil could be causing the swelling in your legs.  Please discuss this with your cardiologist next time that you see them.  Take care, Dr Jerline Pain  PLEASE NOTE:  If you had any lab tests please let us know if you have not heard back within a few days. You may see your results on mychart before we have a chance to review them but we will give you a call once they are reviewed by [place]. If we ordered any referrals today, please let us know if you have not heard from their office within the next week.   Please try these tips to maintain a healthy lifestyle:  Eat at least 3 REAL meals and 1-2 snacks per day.  Aim for no more than 5 hours between eating.  If you eat breakfast, please do so within one hour of getting up.   Each meal should contain half fruits/vegetables, one quarter protein, and one quarter carbs (no bigger than a computer mouse)  Cut down on sweet beverages. This includes juice, soda, and sweet tea.   Drink at least 1 glass of water with each meal and aim for at least 8 glasses per day  Exercise at least 150 minutes every week.

## 2020-12-01 NOTE — Assessment & Plan Note (Signed)
Managed by endocrinology.  Check TSH.

## 2020-12-01 NOTE — Assessment & Plan Note (Signed)
Acute flare.  Start Valtrex 1000 mg daily for 5 days.  She will let me know if not improving.

## 2020-12-10 ENCOUNTER — Other Ambulatory Visit: Payer: Self-pay

## 2020-12-10 ENCOUNTER — Ambulatory Visit (INDEPENDENT_AMBULATORY_CARE_PROVIDER_SITE_OTHER): Payer: PPO | Admitting: Physician Assistant

## 2020-12-10 ENCOUNTER — Encounter: Payer: Self-pay | Admitting: Physician Assistant

## 2020-12-10 VITALS — BP 132/79 | HR 83 | Temp 98.7°F | Ht 63.0 in | Wt 131.2 lb

## 2020-12-10 DIAGNOSIS — R600 Localized edema: Secondary | ICD-10-CM

## 2020-12-10 DIAGNOSIS — S065X9A Traumatic subdural hemorrhage with loss of consciousness of unspecified duration, initial encounter: Secondary | ICD-10-CM | POA: Diagnosis not present

## 2020-12-10 DIAGNOSIS — I872 Venous insufficiency (chronic) (peripheral): Secondary | ICD-10-CM

## 2020-12-10 DIAGNOSIS — S065XAA Traumatic subdural hemorrhage with loss of consciousness status unknown, initial encounter: Secondary | ICD-10-CM

## 2020-12-10 LAB — CBC
HCT: 35.6 % — ABNORMAL LOW (ref 36.0–46.0)
Hemoglobin: 11.3 g/dL — ABNORMAL LOW (ref 12.0–15.0)
MCHC: 31.6 g/dL (ref 30.0–36.0)
MCV: 81.4 fl (ref 78.0–100.0)
Platelets: 291 10*3/uL (ref 150.0–400.0)
RBC: 4.38 Mil/uL (ref 3.87–5.11)
RDW: 15.1 % (ref 11.5–15.5)
WBC: 4.8 10*3/uL (ref 4.0–10.5)

## 2020-12-10 LAB — VITAMIN B12: Vitamin B-12: 1187 pg/mL — ABNORMAL HIGH (ref 211–911)

## 2020-12-10 MED ORDER — FUROSEMIDE 20 MG PO TABS: 10.0000 mg | ORAL_TABLET | Freq: Every day | ORAL | 0 refills | Status: DC

## 2020-12-10 NOTE — Progress Notes (Signed)
Acute Office Visit  Subjective:    Patient ID: [name] [name], female    DOB: Oct 11, 1927, 87 y.o.   MRN: [id number]  Chief Complaint  Patient presents with   Drainage     Right leg drainage     HPI Patient is in today for bilateral leg clear drainage, R > L x several days.  Patient is here with her daughter, who drove her to our office and is present in the exam room.  Patient is very hard of hearing.  Daughter and patient state that there is no history of leg swelling and drainage like this.  She denies any chest pain or shortness of breath.  She denies any pain or numbness in her legs.  She denies any open sores.    Past Medical History:  Diagnosis Date   ALLERGIC RHINITIS 11/02/2006   CKD (chronic kidney disease), stage III ([place])    HYPERLIPIDEMIA 11/04/2006   HYPOTHYROIDISM, POSTSURGICAL 03/28/2007   Malignant neoplasm of thyroid gland ([place]) 03/28/2007   5/07: righte lobectomy--path shows 3 cm follicular ca [id number]: completion left lobectomy, no more tumor seen 09/07: thyroglobulin 0.2 9ab neg) 11/07: i-131 rx, 101 [facility] 6/08: thyroglogulin 0.2 (ab neg) , hypothyroid body scan neg (clinically tolerated hypothyroidism poorly) 06/09: tg undetectable (ab=1.5, normal) 12/10: tg undetectable (ab neg) 6/11: tg undectable (ab neg)   OSTEOPENIA 11/02/2006    Past Surgical History:  Procedure Laterality Date   ABDOMINAL HYSTERECTOMY     CERVICAL POLYPECTOMY     OPEN REDUCTION INTERNAL FIXATION (ORIF) DISTAL RADIAL FRACTURE Left 01/14/2014   Procedure: OPEN REDUCTION INTERNAL FIXATION DISTAL RADIAL FRACTURE;  Surgeon: Linna Hoff, MD;  Location: [place];  Service: Orthopedics;  Laterality: Left;    Family History  Problem Relation Age of Onset   Diabetes Sister     Social History   Socioeconomic History   Marital status: Widowed    Spouse name: Not on file   Number of children: Not on file   Years of education: Not on file   Highest education level: Not on file  Occupational  History   Not on file  Tobacco Use   Smoking status: Never   Smokeless tobacco: Never  Substance and Sexual Activity   Alcohol use: Not Currently   Drug use: Never   Sexual activity: Not on file  Other Topics Concern   Not on file  Social History Narrative   Doesn't drive   Social Determinants of Health   Financial Resource Strain: Not on file  Food Insecurity: Not on file  Transportation Needs: Not on file  Physical Activity: Not on file  Stress: Not on file  Social Connections: Not on file  Intimate Partner Violence: Not on file    Outpatient Medications Prior to Visit  Medication Sig Dispense Refill   calcitRIOL (ROCALTROL) 0.25 MCG capsule Take 1 capsule by mouth once daily 30 capsule 3   calcium carbonate (OS-CAL) 600 MG TABS Take 600 mg by mouth at bedtime.     docusate sodium (COLACE) 100 MG capsule Take 100 mg by mouth at bedtime.      levothyroxine (SYNTHROID) 112 MCG tablet Take 1 tablet (112 mcg total) by mouth daily. 90 tablet 3   Multiple Vitamins-Minerals (MULTIVITAMINS THER. W/MINERALS) TABS Take 1 tablet by mouth daily.     polyethylene glycol powder (GLYCOLAX/MIRALAX) 17 GM/SCOOP powder Take 4.25-8.5 g by mouth at bedtime.     triamcinolone ointment (KENALOG) 0.5 % Apply 1 application topically 2 (  two) times daily. 30 g 0   valACYclovir (VALTREX) 1000 MG tablet Take 1 tablet (1,000 mg total) by mouth daily. Take 1 pill daily for 5 days for outbreaks. 30 tablet 1   verapamil (CALAN-SR) 120 MG CR tablet Take 1 tablet by mouth once daily 90 tablet 2   vitamin C (ASCORBIC ACID) 500 MG tablet Take 1 tablet (500 mg total) by mouth daily. 50 tablet 0   No facility-administered medications prior to visit.    No Known Allergies  Review of Systems REFER TO HPI FOR PERTINENT POSITIVES AND NEGATIVES     Objective:    Physical Exam Vitals and nursing note reviewed.  Constitutional:      Appearance: Normal appearance. She is normal weight. She is not  toxic-appearing.  HENT:     Head: Normocephalic and atraumatic.     Right Ear: External ear normal.     Left Ear: External ear normal.     Nose: Nose normal.     Mouth/Throat:     Mouth: Mucous membranes are moist.  Eyes:     Extraocular Movements: Extraocular movements intact.     Conjunctiva/sclera: Conjunctivae normal.     Pupils: Pupils are equal, round, and reactive to light.  Cardiovascular:     Rate and Rhythm: Normal rate and regular rhythm.     Pulses: Normal pulses.     Heart sounds: Normal heart sounds.     Comments: Bilateral lower extremities are very swollen and pitting, weeping clear fluid.  Bevelyn Buckles' sign is negative bilaterally.  No evidence of cellulitis on either lower extremity. Pulmonary:     Effort: Pulmonary effort is normal.     Breath sounds: Normal breath sounds.  Musculoskeletal:        General: Normal range of motion.     Cervical back: Normal range of motion and neck supple.     Right lower leg: 3+ Pitting Edema present.     Left lower leg: 3+ Pitting Edema present.  Skin:    General: Skin is warm and dry.  Neurological:     General: No focal deficit present.     Mental Status: She is alert and oriented to person, place, and time.  Psychiatric:        Mood and Affect: Mood normal.        Behavior: Behavior normal.        Thought Content: Thought content normal.        Judgment: Judgment normal.    BP 132/79   Pulse 83   Temp 98.7 F (37.1 C)   Ht '5\' 3"'$  (1.6 m)   Wt 131 lb 3.2 oz (59.5 kg)   SpO2 97%   BMI 23.24 kg/m  Wt Readings from Last 3 Encounters:  12/10/20 131 lb 3.2 oz (59.5 kg)  12/01/20 130 lb (59 kg)  07/13/20 131 lb 3.2 oz (59.5 kg)    Health Maintenance Due  Topic Date Due   Zoster Vaccines- Shingrix (1 of 2) Never done   COVID-19 Vaccine (2 - Moderna risk series) 07/16/2019   INFLUENZA VACCINE  11/08/2020    There are no preventive care reminders to display for this patient.   Lab Results  Component Value Date    TSH 1.40 12/01/2020   Lab Results  Component Value Date   WBC 5.6 12/01/2020   HGB 11.3 (L) 12/01/2020   HCT 35.6 (L) 12/01/2020   MCV 81.4 12/01/2020   PLT 327.0 12/01/2020   Lab Results  Component Value Date   NA 139 12/01/2020   K 4.4 12/01/2020   CO2 28 12/01/2020   GLUCOSE 87 12/01/2020   BUN 18 12/01/2020   CREATININE 0.96 12/01/2020   BILITOT 0.7 12/01/2020   ALKPHOS 51 12/01/2020   AST 42 (H) 12/01/2020   ALT 36 (H) 12/01/2020   PROT 7.0 12/01/2020   ALBUMIN 3.4 (L) 12/01/2020   CALCIUM 7.4 (L) 12/01/2020   ANIONGAP 11 10/17/2018   GFR 51.93 (L) 12/01/2020   Lab Results  Component Value Date   CHOL 208 ([place]) 11/06/2007   Lab Results  Component Value Date   HDL 64.8 11/06/2007   No results found for: [facility] Lab Results  Component Value Date   TRIG 72 11/06/2007   Lab Results  Component Value Date   CHOLHDL 3.2 CALC 11/06/2007   No results found for: HGBA1C     Assessment & Plan:   Problem List Items Addressed This Visit   None Visit Diagnoses     Venous insufficiency of both lower extremities    -  Primary   Relevant Medications   furosemide (LASIX) 20 MG tablet   Localized edema       Subdural hematoma (HCC)            Meds ordered this encounter  Medications   furosemide (LASIX) 20 MG tablet    Sig: Take 0.5 tablets (10 mg total) by mouth daily for 10 days.    Dispense:  5 tablet    Refill:  0   1. Venous insufficiency of both lower extremities 2. Localized edema Evidence of venous insufficiency in bilateral lower extremities.  I do not see any signs of cellulitis at this time and therefore will not start on an antibiotic.  With the amount of clear weeping she has, I do think she would benefit from bilateral Unna boots.  I explained to her the procedure for putting these on and that they would need to stay on for the next week, that she should not get these wet.  Patient was agreeable and I was able to personally apply both of these  today.  Following placement of Unna boots patient said that she was comfortable and did not have any pain and did not feel like things were too tight.  I also advised her to keep her legs elevated above heart level as the best as she can at home.  She needs to limit her salt and I also gave her a small tablet of Lasix to take daily.  Given her petite size, I would only do about half a tablet of Lasix 20 mg daily and see if this is enough to help relieve some of the swelling.  We will check back next week and see her progress.  She is to report to the emergency department if any sudden chest pain or shortness of breath.  Patient and daughter are agreeable and understanding of the plan.  3. Subdural hematoma [facility]) Patient and daughter stated that she was due for labs in a few days and requested to have these drawn today in regards to this diagnosis.  Lab was able to comply with her request today.  Total time spent in face-to-face visit including history and physical, application of Unna boots, and then documentation was 40 minutes.   Shery Wauneka M Avry Monte[place]one, PA-C

## 2020-12-10 NOTE — Patient Instructions (Addendum)
Unna boots applied today OK to go to lab to have these drawn (cancel Tuesday's appt) Keep legs elevated above heart level Lasix 1/2 tab daily as directed Drink plenty of water Recheck right away if any pain or worsening swelling, or any chest pain or shortness of breath

## 2020-12-10 NOTE — Addendum Note (Signed)
Addended by: Adah Salvage F on: 12/10/2020 09:09 AM   Modules accepted: Orders

## 2020-12-10 NOTE — Progress Notes (Deleted)
t

## 2020-12-11 LAB — IRON,TIBC AND FERRITIN PANEL
%SAT: 5 % (calc) — ABNORMAL LOW (ref 16–45)
Ferritin: 19 ng/mL (ref 16–288)
Iron: 20 ug/dL — ABNORMAL LOW (ref 45–160)
TIBC: 381 mcg/dL (calc) (ref 250–450)

## 2020-12-14 ENCOUNTER — Other Ambulatory Visit: Payer: PPO

## 2020-12-14 NOTE — Progress Notes (Signed)
Please inform patient of the following:  Her iron is a bit low. Recommend she take ferrous sulfate '65mg'$  every other day on an empty stomach. We can recheck in 3-6 months.  Algis Greenhouse. Jerline Pain, MD 12/14/2020 4:02 PM

## 2020-12-17 ENCOUNTER — Other Ambulatory Visit: Payer: Self-pay

## 2020-12-17 ENCOUNTER — Ambulatory Visit (INDEPENDENT_AMBULATORY_CARE_PROVIDER_SITE_OTHER): Payer: PPO | Admitting: Physician Assistant

## 2020-12-17 VITALS — BP 122/71 | HR 87 | Temp 96.5°F | Ht 63.0 in | Wt 126.2 lb

## 2020-12-17 DIAGNOSIS — I872 Venous insufficiency (chronic) (peripheral): Secondary | ICD-10-CM

## 2020-12-17 DIAGNOSIS — R600 Localized edema: Secondary | ICD-10-CM | POA: Diagnosis not present

## 2020-12-17 LAB — COMPREHENSIVE METABOLIC PANEL
AG Ratio: 1.1 (calc) (ref 1.0–2.5)
ALT: 31 U/L — ABNORMAL HIGH (ref 6–29)
AST: 37 U/L — ABNORMAL HIGH (ref 10–35)
Albumin: 3.6 g/dL (ref 3.6–5.1)
Alkaline phosphatase (APISO): 47 U/L (ref 37–153)
BUN/Creatinine Ratio: 20 (calc) (ref 6–22)
BUN: 20 mg/dL (ref 7–25)
CO2: 31 mmol/L (ref 20–32)
Calcium: 8.3 mg/dL — ABNORMAL LOW (ref 8.6–10.4)
Chloride: 101 mmol/L (ref 98–110)
Creat: 1 mg/dL — ABNORMAL HIGH (ref 0.60–0.95)
Globulin: 3.4 g/dL (calc) (ref 1.9–3.7)
Glucose, Bld: 72 mg/dL (ref 65–99)
Potassium: 4.3 mmol/L (ref 3.5–5.3)
Sodium: 142 mmol/L (ref 135–146)
Total Bilirubin: 0.8 mg/dL (ref 0.2–1.2)
Total Protein: 7 g/dL (ref 6.1–8.1)

## 2020-12-17 MED ORDER — FUROSEMIDE 20 MG PO TABS
20.0000 mg | ORAL_TABLET | Freq: Every day | ORAL | 0 refills | Status: DC
Start: 2020-12-17 — End: 2020-12-20

## 2020-12-17 NOTE — Patient Instructions (Addendum)
Please go to the lab for a recheck of your CMP.  Continue to keep your legs elevated as much as possible and continue on a low-salt diet. Compression socks will help. Continue Lasix 20 mg 1/2-1 tab daily.  Take this with a banana. Recheck as needed.

## 2020-12-17 NOTE — Progress Notes (Signed)
Acute Office Visit  Subjective:    Patient ID: [name] [name], female    DOB: 05/21/1927, 87 y.o.   MRN: [id number]  No chief complaint on file.   HPI Patient is in today for recheck lower extremity edema and removal of unna boots. She feels like she is doing better and has not had anymore weeping. No other symptoms. She is trying to keep her legs elevated.   Past Medical History:  Diagnosis Date   ALLERGIC RHINITIS 11/02/2006   CKD (chronic kidney disease), stage III ([place])    HYPERLIPIDEMIA 11/04/2006   HYPOTHYROIDISM, POSTSURGICAL 03/28/2007   Malignant neoplasm of thyroid gland ([place]) 03/28/2007   5/07: righte lobectomy--path shows 3 cm follicular ca 8/46: completion left lobectomy, no more tumor seen 09/07: thyroglobulin 0.2 9ab neg) 11/07: i-131 rx, 101 mci 6/08: thyroglogulin 0.2 (ab neg) , hypothyroid body scan neg (clinically tolerated hypothyroidism poorly) 06/09: tg undetectable (ab=1.5, normal) 12/10: tg undetectable (ab neg) 6/11: tg undectable (ab neg)   OSTEOPENIA 11/02/2006    Past Surgical History:  Procedure Laterality Date   ABDOMINAL HYSTERECTOMY     CERVICAL POLYPECTOMY     OPEN REDUCTION INTERNAL FIXATION (ORIF) DISTAL RADIAL FRACTURE Left 01/14/2014   Procedure: OPEN REDUCTION INTERNAL FIXATION DISTAL RADIAL FRACTURE;  Surgeon: Linna Hoff, MD;  Location: [place];  Service: Orthopedics;  Laterality: Left;    Family History  Problem Relation Age of Onset   Diabetes Sister     Social History   Socioeconomic History   Marital status: Widowed    Spouse name: Not on file   Number of children: Not on file   Years of education: Not on file   Highest education level: Not on file  Occupational History   Not on file  Tobacco Use   Smoking status: Never   Smokeless tobacco: Never  Substance and Sexual Activity   Alcohol use: Not Currently   Drug use: Never   Sexual activity: Not on file  Other Topics Concern   Not on file  Social History Narrative    Doesn't drive   Social Determinants of Health   Financial Resource Strain: Not on file  Food Insecurity: Not on file  Transportation Needs: Not on file  Physical Activity: Not on file  Stress: Not on file  Social Connections: Not on file  Intimate Partner Violence: Not on file    Outpatient Medications Prior to Visit  Medication Sig Dispense Refill   calcitRIOL (ROCALTROL) 0.25 MCG capsule Take 1 capsule by mouth once daily 30 capsule 3   calcium carbonate (OS-CAL) 600 MG TABS Take 600 mg by mouth at bedtime.     docusate sodium (COLACE) 100 MG capsule Take 100 mg by mouth at bedtime.      furosemide (LASIX) 20 MG tablet Take 0.5 tablets (10 mg total) by mouth daily for 10 days. 5 tablet 0   levothyroxine (SYNTHROID) 112 MCG tablet Take 1 tablet (112 mcg total) by mouth daily. 90 tablet 3   Multiple Vitamins-Minerals (MULTIVITAMINS THER. W/MINERALS) TABS Take 1 tablet by mouth daily.     polyethylene glycol powder (GLYCOLAX/MIRALAX) 17 GM/SCOOP powder Take 4.25-8.5 g by mouth at bedtime.     triamcinolone ointment (KENALOG) 0.5 % Apply 1 application topically 2 (two) times daily. 30 g 0   valACYclovir (VALTREX) 1000 MG tablet Take 1 tablet (1,000 mg total) by mouth daily. Take 1 pill daily for 5 days for outbreaks. 30 tablet 1   verapamil (CALAN-SR) 120  MG CR tablet Take 1 tablet by mouth once daily 90 tablet 2   vitamin C (ASCORBIC ACID) 500 MG tablet Take 1 tablet (500 mg total) by mouth daily. 50 tablet 0   No facility-administered medications prior to visit.    No Known Allergies  Review of Systems REFER TO HPI FOR PERTINENT POSITIVES AND NEGATIVES     Objective:    Physical Exam Vitals and nursing note reviewed.  Constitutional:      Appearance: Normal appearance. She is normal weight. She is not toxic-appearing.  HENT:     Head: Normocephalic and atraumatic.     Right Ear: External ear normal.     Left Ear: External ear normal.     Nose: Nose normal.      Mouth/Throat:     Mouth: Mucous membranes are moist.  Eyes:     Extraocular Movements: Extraocular movements intact.     Conjunctiva/sclera: Conjunctivae normal.     Pupils: Pupils are equal, round, and reactive to light.  Cardiovascular:     Rate and Rhythm: Normal rate and regular rhythm.     Pulses: Normal pulses.     Heart sounds: Normal heart sounds.     Comments: Bilateral lower extremity edema still present on exam, however, markedly improved from last week and no longer has weeping. Small abrasion noted on left anterior shin, but no surrounding infection signs Bevelyn Buckles' sign is negative bilaterally.  No evidence of cellulitis on either lower extremity. Pulmonary:     Effort: Pulmonary effort is normal.     Breath sounds: Normal breath sounds.  Musculoskeletal:        General: Normal range of motion.     Cervical back: Normal range of motion and neck supple.     Right lower leg: 2+ Pitting Edema present.     Left lower leg: 2+ Pitting Edema present.  Skin:    General: Skin is warm and dry.  Neurological:     General: No focal deficit present.     Mental Status: She is alert and oriented to person, place, and time.  Psychiatric:        Mood and Affect: Mood normal.        Behavior: Behavior normal.        Thought Content: Thought content normal.        Judgment: Judgment normal.    BP 122/71   Pulse 87   Temp (!) 96.5 F (35.8 C)   Ht _0  (1.6 m)   Wt 126 lb 3.2 oz (57.2 kg)   SpO2 98%   BMI 22.36 kg/m  Wt Readings from Last 3 Encounters:  12/17/20 126 lb 3.2 oz (57.2 kg)  12/10/20 131 lb 3.2 oz (59.5 kg)  12/01/20 130 lb (59 kg)    Health Maintenance Due  Topic Date Due   Zoster Vaccines- Shingrix (1 of 2) Never done   COVID-19 Vaccine (2 - Moderna risk series) 07/16/2019   INFLUENZA VACCINE  11/08/2020    There are no preventive care reminders to display for this patient.   Lab Results  Component Value Date   TSH 1.40 12/01/2020   Lab Results   Component Value Date   WBC 4.8 12/10/2020   HGB 11.3 (L) 12/10/2020   HCT 35.6 (L) 12/10/2020   MCV 81.4 12/10/2020   PLT 291.0 12/10/2020   Lab Results  Component Value Date   NA 139 12/01/2020   K 4.4 12/01/2020   CO2 28 12/01/2020  GLUCOSE 87 12/01/2020   BUN 18 12/01/2020   CREATININE 0.96 12/01/2020   BILITOT 0.7 12/01/2020   ALKPHOS 51 12/01/2020   AST 42 (H) 12/01/2020   ALT 36 (H) 12/01/2020   PROT 7.0 12/01/2020   ALBUMIN 3.4 (L) 12/01/2020   CALCIUM 7.4 (L) 12/01/2020   ANIONGAP 11 10/17/2018   GFR 51.93 (L) 12/01/2020   Lab Results  Component Value Date   CHOL 208 ([place]) 11/06/2007   Lab Results  Component Value Date   HDL 64.8 11/06/2007   No results found for: [facility] Lab Results  Component Value Date   TRIG 72 11/06/2007   Lab Results  Component Value Date   CHOLHDL 3.2 CALC 11/06/2007   No results found for: HGBA1C     Assessment & Plan:   Problem List Items Addressed This Visit   None  1. Venous insufficiency of both lower extremities 2. Localized edema Unna boots were removed at today's appointment.  Her edema is improved from last week, however she still has 2+ pitting edema present.  No longer has any weeping going on, therefore I do not feel like we need to rewrap the legs.  I do think she needs to continue Lasix 20 mg 1/2-1 tab daily.  She should continue compression socks and keep her legs elevated.  Low-salt diet also encouraged.  I am going to check a c-Met today as she has been taking the Lasix.  She will follow-up if any worsening swelling, weeping returns, or signs of infection started.  This note was prepared with assistance of Systems analyst. Occasional wrong-word or sound-a-like substitutions may have occurred due to the inherent limitations of voice recognition software.    Jiya Kissinger M Vinh Sachs, PA-C

## 2020-12-20 ENCOUNTER — Telehealth: Payer: Self-pay

## 2020-12-20 MED ORDER — FUROSEMIDE 20 MG PO TABS: 20.0000 mg | ORAL_TABLET | Freq: Every day | ORAL | 0 refills | Status: DC

## 2020-12-20 NOTE — Telephone Encounter (Signed)
Medication has been refilled.

## 2020-12-20 NOTE — Telephone Encounter (Signed)
LAST APPOINTMENT DATE:  12/17/20  NEXT APPOINTMENT DATE: None  MEDICATION:furosemide (LASIX) 20 MG tablet  [place] ([place] [place]

## 2020-12-21 ENCOUNTER — Other Ambulatory Visit: Payer: Self-pay | Admitting: *Deleted

## 2020-12-21 MED ORDER — VERAPAMIL HCL ER 120 MG PO TBCR: 120.0000 mg | EXTENDED_RELEASE_TABLET | Freq: Every day | ORAL | 1 refills | Status: DC

## 2021-01-06 ENCOUNTER — Other Ambulatory Visit: Payer: Self-pay

## 2021-01-06 ENCOUNTER — Encounter: Payer: Self-pay | Admitting: Physician Assistant

## 2021-01-06 ENCOUNTER — Ambulatory Visit (INDEPENDENT_AMBULATORY_CARE_PROVIDER_SITE_OTHER): Payer: PPO | Admitting: Physician Assistant

## 2021-01-06 VITALS — BP 122/80 | HR 77 | Temp 97.3°F | Ht 63.0 in | Wt 124.5 lb

## 2021-01-06 DIAGNOSIS — I872 Venous insufficiency (chronic) (peripheral): Secondary | ICD-10-CM | POA: Diagnosis not present

## 2021-01-06 MED ORDER — FUROSEMIDE 20 MG PO TABS: 20.0000 mg | ORAL_TABLET | Freq: Every day | ORAL | 0 refills | Status: DC

## 2021-01-06 NOTE — Patient Instructions (Signed)
It was great to see you!  Start 20 mg lasix daily  We will put in home health orders for wound care to get this under control and can discontinue it once she seems more stable   If you do not have home health access within 1, please return to our office to likely re-apply new [employer].  Let's have you follow-up to check in with Dr. Jerline Pain in 1 month to discuss ongoing lasix use.  Stay well hydrated.  Take care,  Inda Coke PA-C

## 2021-01-06 NOTE — Progress Notes (Addendum)
[name] [name] is a 87 y.o. female here for draining of lower extremities.   History of Present Illness:   Chief Complaint  Patient presents with   Recheck lower extremities    Bilateral lower legs draining clear fluid since Sunday.    HPI  venous insufficiency Ms. Blevens presents today with c/o drainage from both legs. On 12/10/20 she was treated with unna boots on both lower extremities and found this to be helpful. Seven days later she had them removed and hasn't had any problems until now. She has been taking 10mg  of lasix for concern that the prescribed 20mg  would be too much.  She only took this for a total of 10 days.  A fluid filled blister appeared a couple of days ago on the top of her left foot and it soon opened and leaked clear fluid.  She sees podiatry regularly.  Denies: Fever, chills, purulent drainage, chest pain, shortness of breath.  Past Medical History:  Diagnosis Date   ALLERGIC RHINITIS 11/02/2006   CKD (chronic kidney disease), stage III ([place])    HYPERLIPIDEMIA 11/04/2006   HYPOTHYROIDISM, POSTSURGICAL 03/28/2007   Malignant neoplasm of thyroid gland ([place]) 03/28/2007   5/07: righte lobectomy--path shows 3 cm follicular ca 6/01: completion left lobectomy, no more tumor seen 09/07: thyroglobulin 0.2 9ab neg) 11/07: i-131 rx, 101 mci 6/08: thyroglogulin 0.2 (ab neg) , hypothyroid body scan neg (clinically tolerated hypothyroidism poorly) 06/09: tg undetectable (ab=1.5, normal) 12/10: tg undetectable (ab neg) 6/11: tg undectable (ab neg)   OSTEOPENIA 11/02/2006     Social History   Tobacco Use   Smoking status: Never   Smokeless tobacco: Never  Substance Use Topics   Alcohol use: Not Currently   Drug use: Never    Past Surgical History:  Procedure Laterality Date   ABDOMINAL HYSTERECTOMY     CERVICAL POLYPECTOMY     OPEN REDUCTION INTERNAL FIXATION (ORIF) DISTAL RADIAL FRACTURE Left 01/14/2014   Procedure: OPEN REDUCTION INTERNAL FIXATION DISTAL RADIAL  FRACTURE;  Surgeon: Linna Hoff, MD;  Location: [facility];  Service: Orthopedics;  Laterality: Left;    Family History  Problem Relation Age of Onset   Diabetes Sister     No Known Allergies  Current Medications:   Current Outpatient Medications:    calcitRIOL (ROCALTROL) 0.25 MCG capsule, Take 1 capsule by mouth once daily, Disp: 30 capsule, Rfl: 3   calcium carbonate (OS-CAL) 600 MG TABS, Take 600 mg by mouth at bedtime., Disp: , Rfl:    docusate sodium (COLACE) 100 MG capsule, Take 100 mg by mouth at bedtime. , Disp: , Rfl:    furosemide (LASIX) 20 MG tablet, Take 1 tablet (20 mg total) by mouth daily., Disp: 30 tablet, Rfl: 0   Iron, Ferrous Sulfate, 325 (65 Fe) MG TABS, Take 1 tablet by mouth daily in the afternoon., Disp: , Rfl:    levothyroxine (SYNTHROID) 112 MCG tablet, Take 1 tablet (112 mcg total) by mouth daily., Disp: 90 tablet, Rfl: 3   Multiple Vitamins-Minerals (MULTIVITAMINS THER. W/MINERALS) TABS, Take 1 tablet by mouth daily., Disp: , Rfl:    triamcinolone ointment (KENALOG) 0.5 %, Apply 1 application topically 2 (two) times daily., Disp: 30 g, Rfl: 0   valACYclovir (VALTREX) 1000 MG tablet, Take 1 tablet (1,000 mg total) by mouth daily. Take 1 pill daily for 5 days for outbreaks. (Patient taking differently: Take 1,000 mg by mouth daily as needed. Take 1 pill daily for 5 days for outbreaks.), Disp: 30 tablet,  Rfl: 1   verapamil (CALAN-SR) 120 MG CR tablet, Take 1 tablet (120 mg total) by mouth daily., Disp: 90 tablet, Rfl: 1   vitamin C (ASCORBIC ACID) 500 MG tablet, Take 1 tablet (500 mg total) by mouth daily., Disp: 50 tablet, Rfl: 0   Review of Systems:   ROS Negative unless otherwise specified per HPI. Vitals:   Vitals:   01/06/21 0930  BP: 122/80  Pulse: 77  Temp: (!) 97.3 F (36.3 C)  TempSrc: Temporal  SpO2: 96%  Weight: 124 lb 8 oz (56.5 kg)  Height: 5\' 3"  (1.6 m)     Body mass index is 22.05 kg/m.  Physical Exam:   Physical Exam Vitals and  nursing note reviewed.  Constitutional:      General: She is not in acute distress.    Appearance: She is well-developed. She is not ill-appearing or toxic-appearing.  Cardiovascular:     Rate and Rhythm: Normal rate and regular rhythm.     Pulses: Normal pulses.     Heart sounds: Normal heart sounds, S1 normal and S2 normal.     Comments: Adequate capillary refill time of 2 sec in bilateral toes Pulmonary:     Effort: Pulmonary effort is normal.     Breath sounds: Normal breath sounds.  Skin:    General: Skin is warm and dry.     Comments: Bilateral legs with 1+ edema Bilateral legs weeping clear fluid Slight erythema in bilateral feet Evidence of recently open blister approximately the size of a quarter on the dorsal aspect of left foot  Neurological:     Mental Status: She is alert.     GCS: GCS eye subscore is 4. GCS verbal subscore is 5. GCS motor subscore is 6.     Comments: Normal perceived sensation to bilateral feet  Psychiatric:        Speech: Speech normal.        Behavior: Behavior normal. Behavior is cooperative.   Procedure: Application of Unna Boot Position of ulcer/swelling: open blister on dorsal aspect of R foot with superficial skin loss approximately the size of a quarter; slight clear drainage; bilateral LE with 1+ edema and mild weeping of clear fluid  Preparation: bilateral LE were cleansed with sterile saline and gauze; then patted dry with dry gauze  Application: Unna boot application to bilateral LE, followed by gauze layer and then coban layer performed on both extremities  Patient response: Tolerated well, able to move toes freely with good reported sensation and adequate capillary refill  Assessment and Plan:   Venous insufficiency of both lower extremities Unna boots applied to bilateral legs today Restart 20 mg daily Lasix x 1 month Discussed need for ongoing wound care, family is agreeable to home health order for wound care at this  time  Discussed with family need to monitor patient's overall symptoms, check toes regularly to assess for swelling/pain/sensation.   I did discuss with him that should they not be able to be seen within 1 week, to return to our office for removal and potential reapplication of Unna boot Worsening precautions discussed  Follow-up with PCP to determine need for long-term Lasix after 1 month   I,Havlyn C Ratchford,acting as a Education administrator for [employer], PA.,have documented all relevant documentation on the behalf of [name] Coke, PA,as directed by  [name] Coke, PA while in the presence of [name] [name], [place].  I, [name] [name], [place], have reviewed all documentation for this visit. The documentation on 01/06/21 for  the exam, diagnosis, procedures, and orders are all accurate and complete.   [name] Coke, PA-C

## 2021-01-07 ENCOUNTER — Ambulatory Visit (INDEPENDENT_AMBULATORY_CARE_PROVIDER_SITE_OTHER): Payer: PPO | Admitting: Cardiology

## 2021-01-07 ENCOUNTER — Encounter: Payer: Self-pay | Admitting: Cardiology

## 2021-01-07 ENCOUNTER — Telehealth: Payer: Self-pay | Admitting: Cardiology

## 2021-01-07 DIAGNOSIS — R600 Localized edema: Secondary | ICD-10-CM

## 2021-01-07 MED ORDER — FUROSEMIDE 40 MG PO TABS: 40.0000 mg | ORAL_TABLET | Freq: Every day | ORAL | 3 refills | Status: DC

## 2021-01-07 NOTE — Telephone Encounter (Signed)
Pt c/o swelling: STAT is pt has developed SOB within 24 hours  If swelling, where is the swelling located? Legs and ankles  How much weight have you gained and in what time span? N/A  Have you gained 3 pounds in a day or 5 pounds in a week? No  Do you have a log of your daily weights (if so, list)? No  Are you currently taking a fluid pill? Yes   Are you currently SOB? No; patient does develop SOB when trying to take away  Have you traveled recently? No

## 2021-01-07 NOTE — Assessment & Plan Note (Signed)
Agree with Dr. Jerline Pain that the verapamil, a calcium channel blocker, may be contributing to her lower extremity edema.  Since her blood pressure is adequately controlled, we will discontinue the verapamil 120 mg daily.  She calls this her little brown pill. - I will go ahead and increase her Lasix also from 20 mg up to 40 mg.  Her most recent creatinine was very reasonable personally reviewed.  See epic.  No potassium is necessary at this time. Continue with leg elevation as well.  Wonderful.

## 2021-01-07 NOTE — Telephone Encounter (Signed)
Returned call to Pt's daughter.  Pt is having left lower extremity edema causing weeping.  Pt is wearing Unna boots and has had lasix increased to 20 mg daily by pcp.  Per daughter-Pcp indicated that verapamil could be causing edema.  Requesting sooner appt with cardiologist.  Offered today at 2:20 pm with Dr. Marlou Porch.  Daughter will check with Pt and call back if that does not work.

## 2021-01-07 NOTE — Progress Notes (Signed)
Cardiology Office Note:    Date:  01/07/2021   ID:  [name] [name], DOB 1927-07-03, MRN [id number]  PCP:  Vivi Barrack, MD   [facility] Providers Cardiologist:  Candee Furbish, MD     Referring MD: Vivi Barrack, MD    History of Present Illness:    [name] [name] is a 87 y.o. female here for the follow-up of LE edema at the request of her PCP, Dr. Jerline Pain.  Today: She is accompanied by a family member. Yesterday she was placed in [employer]. She continues to have some drainage, which is more noticeable when lying down in bed at night. She does try to prop her legs up with pillows.  Her legs are wrapped in Ace wraps.  She has been on Lasix 20 mg daily.  She is also on verapamil 120.  She endorses dizziness upon standing.  She denies any palpitations, chest pain, or shortness of breath. No headaches, syncope, orthopnea, or PND. Also has no exertional symptoms.   Past Medical History:  Diagnosis Date   ALLERGIC RHINITIS 11/02/2006   CKD (chronic kidney disease), stage III ([place])    HYPERLIPIDEMIA 11/04/2006   HYPOTHYROIDISM, POSTSURGICAL 03/28/2007   Malignant neoplasm of thyroid gland ([place]) 03/28/2007   5/07: righte lobectomy--path shows 3 cm follicular ca 4/66: completion left lobectomy, no more tumor seen 09/07: thyroglobulin 0.2 9ab neg) 11/07: i-131 rx, 101 mci 6/08: thyroglogulin 0.2 (ab neg) , hypothyroid body scan neg (clinically tolerated hypothyroidism poorly) 06/09: tg undetectable (ab=1.5, normal) 12/10: tg undetectable (ab neg) 6/11: tg undectable (ab neg)   OSTEOPENIA 11/02/2006    Past Surgical History:  Procedure Laterality Date   ABDOMINAL HYSTERECTOMY     CERVICAL POLYPECTOMY     OPEN REDUCTION INTERNAL FIXATION (ORIF) DISTAL RADIAL FRACTURE Left 01/14/2014   Procedure: OPEN REDUCTION INTERNAL FIXATION DISTAL RADIAL FRACTURE;  Surgeon: Linna Hoff, MD;  Location: [place];  Service: Orthopedics;  Laterality: Left;    Current Medications: Current Meds   Medication Sig   calcitRIOL (ROCALTROL) 0.25 MCG capsule Take 1 capsule by mouth once daily   calcium carbonate (OS-CAL) 600 MG TABS Take 600 mg by mouth at bedtime.   docusate sodium (COLACE) 100 MG capsule Take 100 mg by mouth at bedtime.    furosemide (LASIX) 40 MG tablet Take 1 tablet (40 mg total) by mouth daily.   Iron, Ferrous Sulfate, 325 (65 Fe) MG TABS Take 1 tablet by mouth daily in the afternoon.   levothyroxine (SYNTHROID) 112 MCG tablet Take 1 tablet (112 mcg total) by mouth daily.   Multiple Vitamins-Minerals (MULTIVITAMINS THER. W/MINERALS) TABS Take 1 tablet by mouth daily.   triamcinolone ointment (KENALOG) 0.5 % Apply 1 application topically 2 (two) times daily.   valACYclovir (VALTREX) 1000 MG tablet Take 1 tablet (1,000 mg total) by mouth daily. Take 1 pill daily for 5 days for outbreaks.   vitamin C (ASCORBIC ACID) 500 MG tablet Take 1 tablet (500 mg total) by mouth daily.   [DISCONTINUED] furosemide (LASIX) 20 MG tablet Take 1 tablet (20 mg total) by mouth daily.   [DISCONTINUED] verapamil (CALAN-SR) 120 MG CR tablet Take 1 tablet (120 mg total) by mouth daily.     Allergies:   Patient has no known allergies.   Social History   Socioeconomic History   Marital status: Widowed    Spouse name: Not on file   Number of children: Not on file   Years of education: Not on file  Highest education level: Not on file  Occupational History   Not on file  Tobacco Use   Smoking status: Never   Smokeless tobacco: Never  Substance and Sexual Activity   Alcohol use: Not Currently   Drug use: Never   Sexual activity: Not on file  Other Topics Concern   Not on file  Social History Narrative   Doesn't drive   Social Determinants of Health   Financial Resource Strain: Not on file  Food Insecurity: Not on file  Transportation Needs: Not on file  Physical Activity: Not on file  Stress: Not on file  Social Connections: Not on file     Family History: The patient's  family history includes Diabetes in her sister.  ROS:   Please see the history of present illness.    (+) LE edema (+) Positional dizziness All other systems reviewed and are negative.  EKGs/Labs/Other Studies Reviewed:    The following studies were reviewed today:  LE Venous DVT 10/17/2018: Summary:  Right: There is no evidence of deep vein thrombosis in the lower  extremity. No cystic structure found in the popliteal fossa.  Left: There is no evidence of a common femoral vein obstruction.   Carotid Arterial Duplex 10/16/2018: Summary:  Right Carotid: Velocities in the right ICA are consistent with a 1-39%  stenosis.   Left Carotid: Velocities in the left ICA are consistent with a 1-39%  stenosis.   Vertebrals:  Bilateral vertebral arteries demonstrate antegrade flow.  Subclavians: Normal flow hemodynamics were seen in bilateral subclavian               arteries.   Echo 10/16/2018:  1. The left ventricle has low normal systolic function, with an ejection  fraction of 50-55%. The cavity size was normal. There is mildly increased  left ventricular wall thickness. Left ventricular diastolic Doppler  parameters are consistent with  impaired relaxation. Elevated left ventricular end-diastolic pressure No  evidence of left ventricular regional wall motion abnormalities.   2. The right ventricle has normal systolic function. The cavity was  normal. There is no increase in right ventricular wall thickness.   3. The aortic valve was not well visualized. Moderate sclerosis of the  aortic valve. Aortic valve regurgitation was not assessed by color flow  Doppler. Moderate aortic annular calcification noted.   4. The mitral valve is degenerative. Mild thickening of the mitral valve  leaflet. There is moderate mitral annular calcification present.   CTA Chest 10/16/2018: IMPRESSION: 1. Negative for pulmonary embolus. 2. Heterogeneous pulmonary parenchyma suggesting small airways disease.  No focal airspace disease to suggest pneumonia. 3. Mild cardiomegaly.  Small hiatal hernia. 4. Small bilateral pulmonary nodules largest measuring 6 mm in the right lower lobe. Non-contrast chest CT at 3-6 months is recommended. If the nodules are stable at time of repeat CT, then future CT at 18-24 months (from today's scan) is considered optional for low-risk patients, but is recommended for high-risk patients. This recommendation follows the consensus statement: Guidelines for Management of Incidental Pulmonary Nodules Detected on CT Images: From the Fleischner Society 2017; Radiology 2017; 284:228-243.   Aortic Atherosclerosis (ICD10-I70.0).  EKG:  EKG is personally reviewed and interpreted. 01/07/2021: EKG was not ordered.  Recent Labs: 01/13/2020: Magnesium 1.9 12/01/2020: TSH 1.40 12/10/2020: Hemoglobin 11.3; Platelets 291.0 12/17/2020: ALT 31; BUN 20; Creat 1.00; Potassium 4.3; Sodium 142   Recent Lipid Panel    Component Value Date/Time   CHOL 208 (HH) 11/06/2007 1102   TRIG 72  11/06/2007 1102   HDL 64.8 11/06/2007 1102   CHOLHDL 3.2 CALC 11/06/2007 1102   VLDL 14 11/06/2007 1102   LDLDIRECT 124.5 11/06/2007 1102     Risk Assessment/Calculations:          Physical Exam:    VS:  BP 130/80 (BP Location: Left Arm, Patient Position: Sitting, Cuff Size: Normal)   Pulse 84   Ht 5\' 3"  (1.6 m)   Wt 126 lb (57.2 kg)   SpO2 97%   BMI 22.32 kg/m     Wt Readings from Last 3 Encounters:  01/07/21 126 lb (57.2 kg)  01/06/21 124 lb 8 oz (56.5 kg)  12/17/20 126 lb 3.2 oz (57.2 kg)     GEN: Well nourished, well developed in no acute distress HEENT: Normal NECK: No JVD; No carotid bruits LYMPHATICS: No lymphadenopathy CARDIAC: RRR, no murmurs, rubs, gallops RESPIRATORY:  Clear to auscultation without rales, wheezing or rhonchi  ABDOMEN: Soft, non-tender, non-distended MUSCULOSKELETAL:  No edema; No deformity  SKIN: Warm and dry NEUROLOGIC:  Alert and oriented x  3 PSYCHIATRIC:  Normal affect   ASSESSMENT:    1. Leg edema    PLAN:    In order of problems listed above: Leg edema Agree with Dr. Jerline Pain that the verapamil, a calcium channel blocker, may be contributing to her lower extremity edema.  Since her blood pressure is adequately controlled, we will discontinue the verapamil 120 mg daily.  She calls this her little brown pill. - I will go ahead and increase her Lasix also from 20 mg up to 40 mg.  Her most recent creatinine was very reasonable personally reviewed.  See epic.  No potassium is necessary at this time. Continue with leg elevation as well.  Wonderful.       Follow-up: in jan sched.  Medication Adjustments/Labs and Tests Ordered: Current medicines are reviewed at length with the patient today.  Concerns regarding medicines are outlined above.   No orders of the defined types were placed in this encounter.  Meds ordered this encounter  Medications   furosemide (LASIX) 40 MG tablet    Sig: Take 1 tablet (40 mg total) by mouth daily.    Dispense:  90 tablet    Refill:  3    Patient Instructions  Medication Instructions:  Please discontinue your Verapamil. Increase Furosemide to 40 mg a day. Continue all other medications as listed.  *If you need a refill on your cardiac medications before your next appointment, please call your pharmacy*  Follow-Up: At [facility], you and your health needs are our priority.  As part of our continuing mission to provide you with exceptional heart care, we have created designated Provider Care Teams.  These Care Teams include your primary Cardiologist (physician) and Advanced Practice Providers (APPs -  Physician Assistants and Nurse Practitioners) who all work together to provide you with the care you need, when you need it.  We recommend signing up for the patient portal called "MyChart".  Sign up information is provided on this After Visit Summary.  MyChart is used to connect with  patients for Virtual Visits (Telemedicine).  Patients are able to view lab/test results, encounter notes, upcoming appointments, etc.  Non-urgent messages can be sent to your provider as well.   To learn more about what you can do with MyChart, go to [email or web address].    Your next appointment:   Follow up as scheduled.  Thank you for choosing [facility]!!  I,Mathew Stumpf,acting as a Education administrator for [employer], MD.,have documented all relevant documentation on the behalf of Candee Furbish, MD,as directed by  Candee Furbish, MD while in the presence of Candee Furbish, MD.  I, Candee Furbish, MD, have reviewed all documentation for this visit. The documentation on 01/07/21 for the exam, diagnosis, procedures, and orders are all accurate and complete.   Signed, Candee Furbish, MD  01/07/2021 2:31 PM    [place]

## 2021-01-07 NOTE — Patient Instructions (Signed)
Medication Instructions:  Please discontinue your Verapamil. Increase Furosemide to 40 mg a day. Continue all other medications as listed.  *If you need a refill on your cardiac medications before your next appointment, please call your pharmacy*  Follow-Up: At [facility], you and your health needs are our priority.  As part of our continuing mission to provide you with exceptional heart care, we have created designated Provider Care Teams.  These Care Teams include your primary Cardiologist (physician) and Advanced Practice Providers (APPs -  Physician Assistants and Nurse Practitioners) who all work together to provide you with the care you need, when you need it.  We recommend signing up for the patient portal called "MyChart".  Sign up information is provided on this After Visit Summary.  MyChart is used to connect with patients for Virtual Visits (Telemedicine).  Patients are able to view lab/test results, encounter notes, upcoming appointments, etc.  Non-urgent messages can be sent to your provider as well.   To learn more about what you can do with MyChart, go to [email or web address].    Your next appointment:   Follow up as scheduled.  Thank you for choosing [facility]!!

## 2021-01-11 ENCOUNTER — Other Ambulatory Visit: Payer: Self-pay

## 2021-01-11 ENCOUNTER — Ambulatory Visit (INDEPENDENT_AMBULATORY_CARE_PROVIDER_SITE_OTHER): Payer: PPO | Admitting: Family Medicine

## 2021-01-11 ENCOUNTER — Encounter: Payer: Self-pay | Admitting: Family Medicine

## 2021-01-11 VITALS — BP 129/76 | HR 94 | Temp 97.9°F | Ht 63.0 in | Wt 122.2 lb

## 2021-01-11 DIAGNOSIS — R600 Localized edema: Secondary | ICD-10-CM

## 2021-01-11 DIAGNOSIS — I10 Essential (primary) hypertension: Secondary | ICD-10-CM

## 2021-01-11 DIAGNOSIS — Z23 Encounter for immunization: Secondary | ICD-10-CM

## 2021-01-11 NOTE — Assessment & Plan Note (Signed)
Doing much better.  Unna boots were removed today.  She will continue Lasix 40 mg daily.  Follow-up again in a couple weeks.  We can recheck labs at that point.

## 2021-01-11 NOTE — Progress Notes (Signed)
   [name] [name] is a 87 y.o. female who presents today for an office visit.  Assessment/Plan:  Chronic Problems Addressed Today: Leg edema Doing much better.  Unna boots were removed today.  She will continue Lasix 40 mg daily.  Follow-up again in a couple weeks.  We can recheck labs at that point.  Essential hypertension Blood pressure well controlled off of verapamil.     Subjective:  HPI:  She is here for follow up. She was last seen in the office on 12/01/2020 for leg edema.  Since our last visit she has seen a couple of other providers in this office for leg edema.  Had Unna boots placed about a week ago.  She follow-up with cardiology yesterday.  She is accompanied by her daughter.  She continues to have some drainage and pain but overall her edema is improving. Her daughter state swelling to the area has gotten better then last time. Her legs are wrapped in Ace wraps.  She saw cardiology last week.  Her verapamil.  Her dose of the Lasix was increased to 40 mg daily.  She has tolerated this change well.        Objective:  Physical Exam: BP 129/76   Pulse 94   Temp 97.9 F (36.6 C) (Temporal)   Ht 5\' 3"  (1.6 m)   Wt 122 lb 3.2 oz (55.4 kg)   SpO2 97%   BMI 21.65 kg/m   Gen: No acute distress, resting comfortably CV: Regular rate and rhythm with no murmurs appreciated Pulm: Normal work of breathing, clear to auscultation bilaterally with no crackles, wheezes, or rhonchi MSK: 1+ edema to mid tibia bilaterally.  Approximately 3 cm ruptured bulla on dorsal aspect of left foot.  No surrounding erythema or other signs of infection. Neuro: Grossly normal, moves all extremities Psych: Normal affect and thought content       I,Savera Zaman,acting as a scribe for [name], MD.,have documented all relevant documentation on the behalf of [name], MD,as directed by  [name], MD while in the presence of [name], MD.   I, [name], MD, have reviewed all  documentation for this visit. The documentation on 01/11/21 for the exam, diagnosis, procedures, and orders are all accurate and complete.  Algis Greenhouse. Jerline Pain, MD 01/11/2021 10:19 AM

## 2021-01-11 NOTE — Assessment & Plan Note (Signed)
Blood pressure well controlled off of verapamil.

## 2021-01-11 NOTE — Patient Instructions (Signed)
It was very nice to see you today!  Your legs look much better.  No medication changes today.  I will see back in a couple of weeks.  Please come back to see me sooner if needed.  Take care, Dr Jerline Pain  PLEASE NOTE:  If you had any lab tests please let us know if you have not heard back within a few days. You may see your results on mychart before we have a chance to review them but we will give you a call once they are reviewed by [place]. If we ordered any referrals today, please let us know if you have not heard from their office within the next week.   Please try these tips to maintain a healthy lifestyle:  Eat at least 3 REAL meals and 1-2 snacks per day.  Aim for no more than 5 hours between eating.  If you eat breakfast, please do so within one hour of getting up.   Each meal should contain half fruits/vegetables, one quarter protein, and one quarter carbs (no bigger than a computer mouse)  Cut down on sweet beverages. This includes juice, soda, and sweet tea.   Drink at least 1 glass of water with each meal and aim for at least 8 glasses per day  Exercise at least 150 minutes every week.

## 2021-01-17 ENCOUNTER — Ambulatory Visit (INDEPENDENT_AMBULATORY_CARE_PROVIDER_SITE_OTHER): Payer: PPO | Admitting: Endocrinology

## 2021-01-17 ENCOUNTER — Other Ambulatory Visit: Payer: Self-pay

## 2021-01-17 VITALS — BP 140/64 | HR 98 | Ht 63.0 in | Wt 122.6 lb

## 2021-01-17 DIAGNOSIS — C73 Malignant neoplasm of thyroid gland: Secondary | ICD-10-CM

## 2021-01-17 DIAGNOSIS — E890 Postprocedural hypothyroidism: Secondary | ICD-10-CM

## 2021-01-17 DIAGNOSIS — E208 Other hypoparathyroidism: Secondary | ICD-10-CM | POA: Diagnosis not present

## 2021-01-17 LAB — TSH: TSH: 5.64 u[IU]/mL — ABNORMAL HIGH (ref 0.35–5.50)

## 2021-01-17 LAB — VITAMIN D 25 HYDROXY (VIT D DEFICIENCY, FRACTURES): VITD: 44.33 ng/mL (ref 30.00–100.00)

## 2021-01-17 LAB — T4, FREE: Free T4: 0.9 ng/dL (ref 0.60–1.60)

## 2021-01-17 NOTE — Patient Instructions (Addendum)
Blood tests are requested for you today.  We'll let you know about the results.  Please come back for a follow-up appointment in 6 months.   

## 2021-01-17 NOTE — Progress Notes (Signed)
Subjective:    Patient ID: [name] [name], female    DOB: 1927/07/31, 87 y.o.   MRN: [id number]  HPI Pt returns for f/u of Stage-2 FTC:    5/07:  right lobectomy--path shows 3 cm right lobe follicular ca (T2 N0 M0).   8/07: completion left lobectomy, no more tumor seen.   9/07  thyroglobulin 11.8 (ab neg).   11/07: RAI, 101 mCi 6/08 thyroglogulin 0.2 (ab neg).   6/08: hypothyroid body scan neg (she tolerated thyroid hormone withdrawal poorly).   6/09 tg undetectable (ab=1.5) 12/10: tg undetectable (ab neg) 6/11:  tg undetectable (ab neg) 6/12: tg undetectable (ab neg) 6/13: tg undetectable (ab neg) 6/15: tg undetectable (ab neg) 6/16: tg undetectable (ab neg).   8/17: tg=0.1 (ab neg).  2/19: tg undetectable (ab neg). 5/20: tg undetectable (ab neg).  3/21 tg undetectable (ab neg).  She denies any nodule at the neck.  Postsurgical hypothyroidism (due to SVT, she is not a candidate for TSH suppression): she takes synthroid as rx'ed.  Postsurgical hypoparathyroidism (she takes rocaltrol; due to SVT, goal Ca++ is in normal range).   Pt says she takes rocaltrol as rx'ed.  She takes Vit-D, at an uncertain dosage.   Past Medical History:  Diagnosis Date   ALLERGIC RHINITIS 11/02/2006   CKD (chronic kidney disease), stage III ([place])    HYPERLIPIDEMIA 11/04/2006   HYPOTHYROIDISM, POSTSURGICAL 03/28/2007   Malignant neoplasm of thyroid gland ([place]) 03/28/2007   5/07: righte lobectomy--path shows 3 cm follicular ca 2/09: completion left lobectomy, no more tumor seen 09/07: thyroglobulin 0.2 9ab neg) 11/07: i-131 rx, 101 mci 6/08: thyroglogulin 0.2 (ab neg) , hypothyroid body scan neg (clinically tolerated hypothyroidism poorly) 06/09: tg undetectable (ab=1.5, normal) 12/10: tg undetectable (ab neg) 6/11: tg undectable (ab neg)   OSTEOPENIA 11/02/2006    Past Surgical History:  Procedure Laterality Date   ABDOMINAL HYSTERECTOMY     CERVICAL POLYPECTOMY     OPEN REDUCTION INTERNAL FIXATION  (ORIF) DISTAL RADIAL FRACTURE Left 01/14/2014   Procedure: OPEN REDUCTION INTERNAL FIXATION DISTAL RADIAL FRACTURE;  Surgeon: Linna Hoff, MD;  Location: [facility];  Service: Orthopedics;  Laterality: Left;    Social History   Socioeconomic History   Marital status: Widowed    Spouse name: Not on file   Number of children: Not on file   Years of education: Not on file   Highest education level: Not on file  Occupational History   Not on file  Tobacco Use   Smoking status: Never   Smokeless tobacco: Never  Substance and Sexual Activity   Alcohol use: Not Currently   Drug use: Never   Sexual activity: Not on file  Other Topics Concern   Not on file  Social History Narrative   Doesn't drive   Social Determinants of Health   Financial Resource Strain: Not on file  Food Insecurity: Not on file  Transportation Needs: Not on file  Physical Activity: Not on file  Stress: Not on file  Social Connections: Not on file  Intimate Partner Violence: Not on file    Current Outpatient Medications on File Prior to Visit  Medication Sig Dispense Refill   calcitRIOL (ROCALTROL) 0.25 MCG capsule Take 1 capsule by mouth once daily 30 capsule 3   docusate sodium (COLACE) 100 MG capsule Take 100 mg by mouth at bedtime.      furosemide (LASIX) 40 MG tablet Take 1 tablet (40 mg total) by mouth daily. 90 tablet 3  Iron, Ferrous Sulfate, 325 (65 Fe) MG TABS Take 1 tablet by mouth daily in the afternoon.     levothyroxine (SYNTHROID) 112 MCG tablet Take 1 tablet (112 mcg total) by mouth daily. 90 tablet 3   Multiple Vitamins-Minerals (MULTIVITAMINS THER. W/MINERALS) TABS Take 1 tablet by mouth daily.     triamcinolone ointment (KENALOG) 0.5 % Apply 1 application topically 2 (two) times daily. 30 g 0   valACYclovir (VALTREX) 1000 MG tablet Take 1 tablet (1,000 mg total) by mouth daily. Take 1 pill daily for 5 days for outbreaks. 30 tablet 1   vitamin C (ASCORBIC ACID) 500 MG tablet Take 1 tablet (500  mg total) by mouth daily. 50 tablet 0   No current facility-administered medications on file prior to visit.    No Known Allergies  Family History  Problem Relation Age of Onset   Diabetes Sister     BP 140/64 (BP Location: Right Arm, Patient Position: Sitting, Cuff Size: Normal)   Pulse 98   Ht 5\' 3"  (1.6 m)   Wt 122 lb 9.6 oz (55.6 kg)   SpO2 99%   BMI 21.72 kg/m    Review of Systems     Objective:   Physical Exam Neck: a healed scar is present.  I do not appreciate a nodule in the thyroid or elsewhere in the neck.    Lab Results  Component Value Date   TSH 5.64 (H) 01/17/2021      Assessment & Plan:  Hypothyroidism: uncontrolled.  As TFT have varied, I advised same synthroid. FTC: recheck today Hypoparathyroidism: recheck today

## 2021-01-18 ENCOUNTER — Other Ambulatory Visit: Payer: Self-pay | Admitting: Endocrinology

## 2021-01-18 LAB — PTH, INTACT AND CALCIUM
Calcium: 7.2 mg/dL — ABNORMAL LOW (ref 8.6–10.4)
PTH: 22 pg/mL (ref 16–77)

## 2021-01-18 LAB — THYROGLOBULIN LEVEL: Thyroglobulin: <0.1 ng/mL — ABNORMAL LOW

## 2021-01-18 LAB — THYROGLOBULIN ANTIBODY: Thyroglobulin Ab: <1 IU/mL (ref <=1)

## 2021-01-18 MED ORDER — CALCITRIOL 0.5 MCG PO CAPS: 0.5000 ug | ORAL_CAPSULE | Freq: Every day | ORAL | 3 refills | Status: DC

## 2021-01-24 ENCOUNTER — Encounter: Payer: Self-pay | Admitting: Family Medicine

## 2021-01-24 ENCOUNTER — Other Ambulatory Visit: Payer: Self-pay

## 2021-01-24 ENCOUNTER — Ambulatory Visit (INDEPENDENT_AMBULATORY_CARE_PROVIDER_SITE_OTHER): Payer: PPO | Admitting: Family Medicine

## 2021-01-24 VITALS — BP 126/74 | HR 97 | Temp 97.0°F | Ht 63.0 in | Wt 128.2 lb

## 2021-01-24 DIAGNOSIS — J329 Chronic sinusitis, unspecified: Secondary | ICD-10-CM | POA: Diagnosis not present

## 2021-01-24 DIAGNOSIS — I10 Essential (primary) hypertension: Secondary | ICD-10-CM | POA: Diagnosis not present

## 2021-01-24 DIAGNOSIS — R600 Localized edema: Secondary | ICD-10-CM

## 2021-01-24 DIAGNOSIS — N183 Chronic kidney disease, stage 3 unspecified: Secondary | ICD-10-CM | POA: Diagnosis not present

## 2021-01-24 LAB — BASIC METABOLIC PANEL
BUN: 17 mg/dL (ref 6–23)
CO2: 33 mEq/L — ABNORMAL HIGH (ref 19–32)
Calcium: 7.3 mg/dL — ABNORMAL LOW (ref 8.4–10.5)
Chloride: 96 mEq/L (ref 96–112)
Creatinine, Ser: 1 mg/dL (ref 0.40–1.20)
GFR: 49.4 mL/min — ABNORMAL LOW (ref >60.00)
Glucose, Bld: 95 mg/dL (ref 70–99)
Potassium: 3.4 mEq/L — ABNORMAL LOW (ref 3.5–5.1)
Sodium: 138 mEq/L (ref 135–145)

## 2021-01-24 MED ORDER — TRIAMCINOLONE ACETONIDE 0.5 % EX OINT: 1.0000 "application " | TOPICAL_OINTMENT | Freq: Two times a day (BID) | CUTANEOUS | 0 refills | Status: DC

## 2021-01-24 NOTE — Progress Notes (Signed)
Please inform patient of the following:  Good news! Labs are stable. We can recheck again in a few weeks when they come back for her follow up visit.  [name] [name]. [name] Pain, MD 01/24/2021 1:02 PM

## 2021-01-24 NOTE — Assessment & Plan Note (Signed)
Recommended that she continue astelin daily and follow up with ENT if not improving.

## 2021-01-24 NOTE — Assessment & Plan Note (Signed)
Well controlled off verapamil.

## 2021-01-24 NOTE — Assessment & Plan Note (Signed)
Check BMET.

## 2021-01-24 NOTE — Assessment & Plan Note (Addendum)
She is up about 6 pounds since our last visit and has noticed more edema.  We discussed and reiterated conservative management options including frequent leg elevation, sodium avoidance and compression stockings.  We will increase dose of Lasix to 40 mg twice daily.  She will follow-up again in a couple of weeks.  Check BMET today.

## 2021-01-24 NOTE — Progress Notes (Signed)
   [name] [name] is a 87 y.o. female who presents today for an office visit.  Assessment/Plan:  Chronic Problems Addressed Today: Essential hypertension Well controlled off verapamil.   Leg edema She is up about 6 pounds since our last visit and has noticed more edema.  We discussed and reiterated conservative management options including frequent leg elevation, sodium avoidance and compression stockings.  We will increase dose of Lasix to 40 mg twice daily.  She will follow-up again in a couple of weeks.  Check BMET today.  CKD (chronic kidney disease), stage III ([place]) Check BMET.   Chronic sinusitis Recommended that she continue astelin daily and follow up with ENT if not improving.     Subjective:  HPI:  Patient here to follow up on leg edema. She was seen for this 2 weeks ago. Was doing well at that point. She has had a little more swelling the last few weeks. She has been compliant with medications. No side effects. Feels like it makes her urinate well.   The recovering spot on her left foot is sore, as well as a couple of her toes on the left foot.   She notes having sinus drainage when trying to sleep, and despite using the prescribed nasal spray.  Additionally, she notes that she was told that her right sinus was so full that it was significantly hindering her ability to hear.        Objective:  Physical Exam: BP 126/74   Pulse 97   Temp (!) 97 F (36.1 C)   Ht 5\' 3"  (1.6 m)   Wt 128 lb 4 oz (58.2 kg)   SpO2 98%   BMI 22.72 kg/m   Gen: No acute distress, resting comfortably MSK:  2+ pitting edema to knees bilaterally.  Small open area on right anterior shin with serous drainage. Neuro: Grossly normal, moves all extremities Psych: Normal affect and thought content      I,[name],acting as a scribe for [name], MD.,have documented all relevant documentation on the behalf of [name], MD,as directed by  [name], MD while in the presence of  [name], MD.  I, [name], MD, have reviewed all documentation for this visit. The documentation on 01/24/21 for the exam, diagnosis, procedures, and orders are all accurate and complete.  Algis Greenhouse. Jerline Pain, MD 01/24/2021 9:08 AM

## 2021-01-24 NOTE — Patient Instructions (Signed)
It was very nice to see you today!  Please increase your Lasix to twice daily.  We will check blood work today.  I will see you back in 2 to 3 weeks.  Please come back to see me sooner if needed.  Take care, Dr Jerline Pain  PLEASE NOTE:  If you had any lab tests please let us know if you have not heard back within a few days. You may see your results on mychart before we have a chance to review them but we will give you a call once they are reviewed by [place]. If we ordered any referrals today, please let us know if you have not heard from their office within the next week.   Please try these tips to maintain a healthy lifestyle:  Eat at least 3 REAL meals and 1-2 snacks per day.  Aim for no more than 5 hours between eating.  If you eat breakfast, please do so within one hour of getting up.   Each meal should contain half fruits/vegetables, one quarter protein, and one quarter carbs (no bigger than a computer mouse)  Cut down on sweet beverages. This includes juice, soda, and sweet tea.   Drink at least 1 glass of water with each meal and aim for at least 8 glasses per day  Exercise at least 150 minutes every week.

## 2021-02-08 ENCOUNTER — Ambulatory Visit (INDEPENDENT_AMBULATORY_CARE_PROVIDER_SITE_OTHER): Payer: PPO | Admitting: Family Medicine

## 2021-02-08 ENCOUNTER — Encounter: Payer: Self-pay | Admitting: Family Medicine

## 2021-02-08 ENCOUNTER — Other Ambulatory Visit: Payer: Self-pay

## 2021-02-08 VITALS — BP 127/72 | HR 84 | Temp 97.9°F | Ht 63.0 in | Wt 134.2 lb

## 2021-02-08 DIAGNOSIS — R600 Localized edema: Secondary | ICD-10-CM | POA: Diagnosis not present

## 2021-02-08 DIAGNOSIS — I10 Essential (primary) hypertension: Secondary | ICD-10-CM

## 2021-02-08 LAB — BASIC METABOLIC PANEL
BUN: 21 mg/dL (ref 6–23)
CO2: 32 mEq/L (ref 19–32)
Calcium: 7.3 mg/dL — ABNORMAL LOW (ref 8.4–10.5)
Chloride: 98 mEq/L (ref 96–112)
Creatinine, Ser: 1.04 mg/dL (ref 0.40–1.20)
GFR: 47.11 mL/min — ABNORMAL LOW (ref >60.00)
Glucose, Bld: 110 mg/dL — ABNORMAL HIGH (ref 70–99)
Potassium: 3.9 mEq/L (ref 3.5–5.1)
Sodium: 138 mEq/L (ref 135–145)

## 2021-02-08 MED ORDER — FUROSEMIDE 40 MG PO TABS: 40.0000 mg | ORAL_TABLET | Freq: Two times a day (BID) | ORAL | 3 refills | Status: DC

## 2021-02-08 NOTE — Assessment & Plan Note (Signed)
At goal off verapamil.

## 2021-02-08 NOTE — Progress Notes (Signed)
Please inform patient of the following:  Labs are all stable.  Algis Greenhouse. Jerline Pain, MD 02/08/2021 12:50 PM

## 2021-02-08 NOTE — Patient Instructions (Signed)
It was very nice to see you today!  Please continue taking on a fluid pill twice per day.  We will check blood work.  I will see you back in 3 to 6 months.  Please come back to see me sooner if needed.  Take care, Dr Jerline Pain  PLEASE NOTE:  If you had any lab tests please let us know if you have not heard back within a few days. You may see your results on mychart before we have a chance to review them but we will give you a call once they are reviewed by [place]. If we ordered any referrals today, please let us know if you have not heard from their office within the next week.   Please try these tips to maintain a healthy lifestyle:  Eat at least 3 REAL meals and 1-2 snacks per day.  Aim for no more than 5 hours between eating.  If you eat breakfast, please do so within one hour of getting up.   Each meal should contain half fruits/vegetables, one quarter protein, and one quarter carbs (no bigger than a computer mouse)  Cut down on sweet beverages. This includes juice, soda, and sweet tea.   Drink at least 1 glass of water with each meal and aim for at least 8 glasses per day  Exercise at least 150 minutes every week.

## 2021-02-08 NOTE — Progress Notes (Signed)
   [name] [name] is a 86 y.o. female who presents today for an office visit.  Assessment/Plan:  Chronic Problems Addressed Today: Essential hypertension At goal off verapamil.  Leg edema Improved since last visit.  We will continue Lasix 40 mg twice daily.  Check BMET.  Discussed conservative measures.  Follow-up in 3 to 6 months.  Discussed reasons to return to care earlier.    Subjective:  HPI:  She is here to follow up on leg edema. She was last seen for this 2 weeks ago.  She has had a little swelling in the last visit. She notes swelling has been improving. We increased the dose of Lasix to 40 mg twice daily. She has been complaint with the medications. No denies effects.        Objective:  Physical Exam: BP 127/72   Pulse 84   Temp 97.9 F (36.6 C) (Temporal)   Ht 5\' 3"  (1.6 m)   Wt 134 lb 3.2 oz (60.9 kg)   SpO2 96%   BMI 23.77 kg/m   Gen: No acute distress, resting comfortably CV: Regular rate and rhythm with no murmurs appreciated Pulm: Normal work of breathing, clear to auscultation bilaterally with no crackles, wheezes, or rhonchi MSK: 1+ pitting edema to knees bilaterally Neuro: Grossly normal, moves all extremities Psych: Normal affect and thought content       I,Savera Zaman,acting as a scribe for [name], MD.,have documented all relevant documentation on the behalf of [name], MD,as directed by  [name], MD while in the presence of [name], MD.   I, [name], MD, have reviewed all documentation for this visit. The documentation on 02/08/21 for the exam, diagnosis, procedures, and orders are all accurate and complete.  Algis Greenhouse. Jerline Pain, MD 02/08/2021 9:00 AM

## 2021-02-08 NOTE — Assessment & Plan Note (Signed)
Improved since last visit.  We will continue Lasix 40 mg twice daily.  Check BMET.  Discussed conservative measures.  Follow-up in 3 to 6 months.  Discussed reasons to return to care earlier.

## 2021-03-02 ENCOUNTER — Ambulatory Visit: Payer: PPO | Admitting: Podiatry

## 2021-03-02 ENCOUNTER — Encounter: Payer: Self-pay | Admitting: Podiatry

## 2021-03-02 ENCOUNTER — Other Ambulatory Visit: Payer: Self-pay

## 2021-03-02 DIAGNOSIS — M79674 Pain in right toe(s): Secondary | ICD-10-CM

## 2021-03-02 DIAGNOSIS — M79675 Pain in left toe(s): Secondary | ICD-10-CM | POA: Diagnosis not present

## 2021-03-02 DIAGNOSIS — M216X2 Other acquired deformities of left foot: Secondary | ICD-10-CM

## 2021-03-02 DIAGNOSIS — B351 Tinea unguium: Secondary | ICD-10-CM | POA: Diagnosis not present

## 2021-03-02 DIAGNOSIS — L84 Corns and callosities: Secondary | ICD-10-CM

## 2021-03-02 NOTE — Progress Notes (Signed)
This patient returns to my office for at risk foot care.  This patient requires this care by a professional since this patient will be at risk due to having CKD.   This patient is unable to cut nails herself since the patient cannot reach her nails.These nails are painful walking and wearing shoes.  Patient presents to the office with her daughter. This patient presents for at risk foot care today.  General Appearance  Alert, conversant and in no acute stress.  Vascular  Dorsalis pedis and posterior tibial  pulses are weakly  palpable  bilaterally.  Capillary return is within normal limits  bilaterally. Cold feet.    Bilaterally. Absent digital toenails.  Neurologic  Senn-Weinstein monofilament wire test within normal limits  bilaterally. Muscle power within normal limits bilaterally.  Nails Thick disfigured discolored nails with subungual debris  from hallux to fifth toes bilaterally. No evidence of bacterial infection or drainage bilaterally.  Orthopedic  No limitations of motion  feet .  No crepitus or effusions noted.  No bony pathology or digital deformities noted.  Plantar flexed fifth metatarsal  B/L.  Skin  normotropic skin  noted bilaterally.  No signs of infections or ulcers noted.   Porokeratosis sub 5th  B/L.  Onychomycosis  Pain in right toes  Pain in left toes  Porokeratosis  B/L.  Consent was obtained for treatment procedures.   Mechanical debridement of nails 1-5  bilaterally performed with a nail nipper.  Filed with dremel without incident. Debride porokeratosis with # 15 blade.   Return office visit    3 months                  Told patient to return for periodic foot care and evaluation due to potential at risk complications.   Gardiner Barefoot DPM

## 2021-03-08 ENCOUNTER — Ambulatory Visit (HOSPITAL_BASED_OUTPATIENT_CLINIC_OR_DEPARTMENT_OTHER): Payer: PPO | Admitting: Cardiology

## 2021-03-18 ENCOUNTER — Telehealth: Payer: Self-pay

## 2021-03-18 NOTE — Telephone Encounter (Signed)
Nurse Assessment Nurse: Kirk Ruths, RN, [facility] Date/Time [name] [name] Time): 03/18/2021 10:20:49 AM Confirm and document reason for call. If symptomatic, describe symptoms. ---Caller says patient called and states her legs are numb this morning is home by herself when patient woke up this morning her head was hurting in the back her left leg is just sore right leg is barely hurting and she says she is still having numbness in the feet HOH and is yeslling answers to daughter on the phone is not able to wstand on feet.  Does the patient have any new or worsening symptoms? ---Yes Will a triage be completed? ---Yes Related visit to physician within the last 2 weeks? ---No Does the PT have any chronic conditions? (i.e. diabetes, asthma, this includes High risk factors for pregnancy, etc.) ---Yes List chronic conditions. ---edema, thyroid, Is this a behavioral health or substance abuse call? ---No  Neurologic Deficit  [1] SEVERE weakness (i.e.,unable to walk or barely able to walk, requires support) [2] new-onset or worsening  03/18/2021 10:18:39 AM Send to Urgent [facility] 03/18/2021 10:31:49 AM 911 Outcome Documentation Dalton, RN, [facility] Reason: daughter calling 03/18/2021 10:31:11 AM Call EMS 911 Now Yes Kirk Ruths, Therapist, sports, [facility]

## 2021-03-18 NOTE — Telephone Encounter (Signed)
See note

## 2021-04-24 ENCOUNTER — Emergency Department (HOSPITAL_COMMUNITY): Payer: PPO

## 2021-04-24 ENCOUNTER — Other Ambulatory Visit: Payer: Self-pay

## 2021-04-24 ENCOUNTER — Emergency Department (HOSPITAL_COMMUNITY)
Admission: EM | Admit: 2021-04-24 | Discharge: 2021-04-26 | Disposition: A | Discharge status: Home / Self Care | Payer: PPO | Attending: Emergency Medicine | Admitting: Emergency Medicine

## 2021-04-24 DIAGNOSIS — W1811XA Fall from or off toilet without subsequent striking against object, initial encounter: Secondary | ICD-10-CM | POA: Insufficient documentation

## 2021-04-24 DIAGNOSIS — S42141A Displaced fracture of glenoid cavity of scapula, right shoulder, initial encounter for closed fracture: Secondary | ICD-10-CM | POA: Insufficient documentation

## 2021-04-24 DIAGNOSIS — I6782 Cerebral ischemia: Secondary | ICD-10-CM | POA: Insufficient documentation

## 2021-04-24 DIAGNOSIS — N183 Chronic kidney disease, stage 3 unspecified: Secondary | ICD-10-CM | POA: Insufficient documentation

## 2021-04-24 DIAGNOSIS — M6281 Muscle weakness (generalized): Secondary | ICD-10-CM | POA: Diagnosis not present

## 2021-04-24 DIAGNOSIS — S42144A Nondisplaced fracture of glenoid cavity of scapula, right shoulder, initial encounter for closed fracture: Secondary | ICD-10-CM | POA: Diagnosis not present

## 2021-04-24 DIAGNOSIS — M25511 Pain in right shoulder: Secondary | ICD-10-CM | POA: Diagnosis not present

## 2021-04-24 DIAGNOSIS — R2681 Unsteadiness on feet: Secondary | ICD-10-CM | POA: Diagnosis not present

## 2021-04-24 DIAGNOSIS — Z85850 Personal history of malignant neoplasm of thyroid: Secondary | ICD-10-CM | POA: Diagnosis not present

## 2021-04-24 DIAGNOSIS — Y92002 Bathroom of unspecified non-institutional (private) residence single-family (private) house as the place of occurrence of the external cause: Secondary | ICD-10-CM | POA: Diagnosis not present

## 2021-04-24 DIAGNOSIS — S42151A Displaced fracture of neck of scapula, right shoulder, initial encounter for closed fracture: Secondary | ICD-10-CM | POA: Diagnosis not present

## 2021-04-24 DIAGNOSIS — E039 Hypothyroidism, unspecified: Secondary | ICD-10-CM | POA: Diagnosis not present

## 2021-04-24 DIAGNOSIS — S4991XA Unspecified injury of right shoulder and upper arm, initial encounter: Secondary | ICD-10-CM | POA: Diagnosis present

## 2021-04-24 DIAGNOSIS — I517 Cardiomegaly: Secondary | ICD-10-CM | POA: Diagnosis not present

## 2021-04-24 DIAGNOSIS — M25519 Pain in unspecified shoulder: Secondary | ICD-10-CM | POA: Diagnosis not present

## 2021-04-24 DIAGNOSIS — Z9181 History of falling: Secondary | ICD-10-CM | POA: Insufficient documentation

## 2021-04-24 DIAGNOSIS — Z043 Encounter for examination and observation following other accident: Secondary | ICD-10-CM | POA: Diagnosis not present

## 2021-04-24 MED ORDER — LEVOTHYROXINE SODIUM 112 MCG PO TABS
112.0000 ug | ORAL_TABLET | Freq: Every day | ORAL | Status: DC
  Administered 2021-04-24: 112 ug via ORAL
  Filled 2021-04-24: qty 1

## 2021-04-24 MED ORDER — FERROUS SULFATE 325 (65 FE) MG PO TABS
325.0000 mg | ORAL_TABLET | Freq: Every day | ORAL | Status: DC
  Administered 2021-04-24 – 2021-04-26 (×3): 325 mg via ORAL
  Filled 2021-04-24 (×3): qty 1

## 2021-04-24 MED ORDER — DOCUSATE SODIUM 100 MG PO CAPS: 100.0000 mg | ORAL_CAPSULE | Freq: Every day | ORAL | Status: DC

## 2021-04-24 MED ORDER — LEVOTHYROXINE SODIUM 112 MCG PO TABS
112.0000 ug | ORAL_TABLET | Freq: Every day | ORAL | Status: DC
  Administered 2021-04-25 – 2021-04-26 (×2): 112 ug via ORAL
  Filled 2021-04-24 (×2): qty 1

## 2021-04-24 MED ORDER — FUROSEMIDE 40 MG PO TABS
40.0000 mg | ORAL_TABLET | Freq: Two times a day (BID) | ORAL | Status: DC
  Administered 2021-04-24 – 2021-04-26 (×5): 40 mg via ORAL
  Filled 2021-04-24 (×5): qty 1

## 2021-04-24 MED ORDER — CALCITRIOL 0.5 MCG PO CAPS
0.5000 ug | ORAL_CAPSULE | Freq: Every day | ORAL | Status: DC
  Administered 2021-04-24 – 2021-04-25 (×2): 0.5 ug via ORAL
  Filled 2021-04-24 (×3): qty 1

## 2021-04-24 MED ORDER — HYDROCODONE-ACETAMINOPHEN 5-325 MG PO TABS
1.0000 | ORAL_TABLET | Freq: Four times a day (QID) | ORAL | Status: DC | PRN
  Administered 2021-04-24 – 2021-04-25 (×2): 1 via ORAL
  Filled 2021-04-24 (×2): qty 1

## 2021-04-24 MED ORDER — HYDROCODONE-ACETAMINOPHEN 5-325 MG PO TABS
1.0000 | ORAL_TABLET | Freq: Once | ORAL | Status: DC
Start: 2021-04-24 — End: 2021-04-24

## 2021-04-24 MED ORDER — ADULT MULTIVITAMIN W/MINERALS CH
1.0000 | ORAL_TABLET | Freq: Every day | ORAL | Status: DC
  Administered 2021-04-24 – 2021-04-26 (×3): 1 via ORAL
  Filled 2021-04-24 (×3): qty 1

## 2021-04-24 NOTE — Evaluation (Signed)
Physical Therapy Evaluation Patient Details Name: [name] [name] MRN: [id number] DOB: [birth year] Today's Date: 04/24/2021  History of Present Illness  [name] [name] is a 88 y.o. year-old female with a history of CKD presenting to the ED with chief complaint of fall. CT showed Vertical fracture through the anterior half glenoid nearly dividing  the glenoid on sagittal reformats. No displacement. No glenohumeral  or acromioclavicular dislocation. Severe glenohumeral osteoarthritis with joint collapse and bulky  spurring. Acromioclavicular osteoarthritis with subchondral cysts  and spurring. No erosion or aggressive bone lesion  Clinical Impression  Pt admitted as above and presenting with functional mobility limitations 2* generalized weakness, non-use of R UE, and balance deficits with high risk of additional falls.  Pt would benefit from follow up rehab at SNF level to maximize IND and safety prior to return home with limited assist.     Recommendations for follow up therapy are one component of a multi-disciplinary discharge planning process, led by the attending physician.  Recommendations may be updated based on patient status, additional functional criteria and insurance authorization.  Follow Up Recommendations Skilled nursing-short term rehab (<3 hours/day)    Assistance Recommended at Discharge Frequent or constant Supervision/Assistance  Patient can return home with the following  A little help with walking and/or transfers;A little help with bathing/dressing/bathroom;Assist for transportation;Help with stairs or ramp for entrance    Equipment Recommendations None recommended by PT  Recommendations for Other Services       Functional Status Assessment Patient has had a recent decline in their functional status and demonstrates the ability to make significant improvements in function in a reasonable and predictable amount of time.     Precautions / Restrictions  Precautions Precautions: Fall;Shoulder Type of Shoulder Precautions: No specific orders given - one would assume sling at all times and NWB Shoulder Interventions: Shoulder sling/immobilizer Required Braces or Orthoses: Sling Restrictions Weight Bearing Restrictions: Yes RUE Weight Bearing: Non weight bearing      Mobility  Bed Mobility Overal bed mobility: Needs Assistance Bed Mobility: Supine to Sit     Supine to sit: Min assist     General bed mobility comments: min assist to manage LEs over EOB, bring trunk to upright and complete rotation to EOB sitting with bed pad    Transfers Overall transfer level: Needs assistance   Transfers: Sit to/from Stand Sit to Stand: Min assist           General transfer comment: min assist for balance from EOB and min assist to bring wt up and fwd and to balance in standing from comode    Ambulation/Gait Ambulation/Gait assistance: Min assist Gait Distance (Feet): 100 Feet (100' twice) Assistive device: 1 person hand held assist Gait Pattern/deviations: Step-through pattern;Decreased step length - right;Decreased step length - left;Shuffle;Trunk flexed Gait velocity: decr     General Gait Details: cues for posture and safety awareness and min assist to balance  Stairs            Wheelchair Mobility    Modified Rankin (Stroke Patients Only)       Balance Overall balance assessment: History of Falls;Needs assistance Sitting-balance support: No upper extremity supported;Feet supported Sitting balance-Leahy Scale: Good     Standing balance support: No upper extremity supported Standing balance-Leahy Scale: Fair                               Pertinent Vitals/Pain Pain Assessment: No/denies pain  Faces Pain Scale: Hurts a little bit Pain Location: R shoulder Pain Descriptors / Indicators: Discomfort;Sore Pain Intervention(s): Limited activity within patient's tolerance;Monitored during session     Home Living Family/patient expects to be discharged to:: Skilled nursing facility Living Arrangements: Alone Available Help at Discharge: Family;Available PRN/intermittently Type of Home: Mobile home Home Access: Stairs to enter Entrance Stairs-Rails: Psychiatric nurse of Steps: 5   Home Layout: One level Home Equipment: Conservation officer, nature (2 wheels)      Prior Function Prior Level of Function : Independent/Modified Independent             Mobility Comments: walker ADLs Comments: independent     Hand Dominance   Dominant Hand: Right    Extremity/Trunk Assessment   Upper Extremity Assessment Upper Extremity Assessment: RUE deficits/detail RUE Deficits / Details: functional ROm and strength of elbow, wrist, hand RUE: Unable to fully assess due to immobilization LUE Deficits / Details: WFL ROm, functional strength LUE Sensation: WNL LUE Coordination: WNL    Lower Extremity Assessment Lower Extremity Assessment: Generalized weakness    Cervical / Trunk Assessment Cervical / Trunk Assessment: Kyphotic  Communication   Communication: HOH  Cognition Arousal/Alertness: Awake/alert Behavior During Therapy: WFL for tasks assessed/performed Overall Cognitive Status: Within Functional Limits for tasks assessed                                          General Comments      Exercises     Assessment/Plan    PT Assessment Patient needs continued PT services  PT Problem List Decreased strength;Decreased activity tolerance;Decreased balance;Decreased mobility;Decreased knowledge of use of DME;Pain       PT Treatment Interventions DME instruction;Gait training;Functional mobility training;Therapeutic activities;Therapeutic exercise;Balance training;Patient/family education    PT Goals (Current goals can be found in the Care Plan section)  Acute Rehab PT Goals Patient Stated Goal: Get out of this wet bed PT Goal Formulation: With  patient Time For Goal Achievement: 05/08/21 Potential to Achieve Goals: Fair    Frequency Min 2X/week     Co-evaluation PT/OT/SLP Co-Evaluation/Treatment: Yes Reason for Co-Treatment: To address functional/ADL transfers PT goals addressed during session: Mobility/safety with mobility OT goals addressed during session: ADL's and self-care       AM-PAC PT "6 Clicks" Mobility  Outcome Measure Help needed turning from your back to your side while in a flat bed without using bedrails?: A Little Help needed moving from lying on your back to sitting on the side of a flat bed without using bedrails?: A Little Help needed moving to and from a bed to a chair (including a wheelchair)?: A Little Help needed standing up from a chair using your arms (e.g., wheelchair or bedside chair)?: A Little Help needed to walk in hospital room?: A Little Help needed climbing 3-5 steps with a railing? : A Lot 6 Click Score: 17    End of Session Equipment Utilized During Treatment: Gait belt Activity Tolerance: Patient tolerated treatment well Patient left: in chair;with call bell/phone within reach;with family/visitor present Nurse Communication: Mobility status PT Visit Diagnosis: Unsteadiness on feet (R26.81);Muscle weakness (generalized) (M62.81);History of falling (Z91.81)    Time: 9937-1696 PT Time Calculation (min) (ACUTE ONLY): 38 min   Charges:   PT Evaluation $PT Eval Low Complexity: 1 Low PT Treatments $Gait Training: 8-22 mins        Debe Coder PT Acute Rehabilitation  Services Pager [phone] Office [phone]   Imanii Gosdin 04/24/2021, 4:44 PM

## 2021-04-24 NOTE — Evaluation (Signed)
Occupational Therapy Evaluation Patient Details Name: [name] [name] MRN: [id number] DOB: [birth year] Today's Date: 04/24/2021   History of Present Illness [name] [name] is a 88 y.o. year-old female with a history of CKD presenting to the ED with chief complaint of fall. CT showed Vertical fracture through the anterior half glenoid nearly dividing  the glenoid on sagittal reformats. No displacement. No glenohumeral  or acromioclavicular dislocation. Severe glenohumeral osteoarthritis with joint collapse and bulky  spurring. Acromioclavicular osteoarthritis with subchondral cysts  and spurring. No erosion or aggressive bone lesion   Clinical Impression   [name] [name] is a 88 year old woman who presents without functional use of right dominant upper extremity, pain and impaired balance resulting in a sudden decline in functional abilities. Patient requires hand hold to ambulate - she usually uses a walker; and mod assist for LB dressing and max assist for UB dressing. Patient able to ambulate to bathroom and assist with toileting - needing assistance for clothing management. Patient will benefit from skilled OT services while in hospital to improve deficits and learn compensatory strategies as needed in order to return to PLOF.  Recommend short term rehab to maximize functional abilities prior to return home.     Recommendations for follow up therapy are one component of a multi-disciplinary discharge planning process, led by the attending physician.  Recommendations may be updated based on patient status, additional functional criteria and insurance authorization.   Follow Up Recommendations  Skilled nursing-short term rehab (<3 hours/day)    Assistance Recommended at Discharge Frequent or constant Supervision/Assistance  Patient can return home with the following A little help with walking and/or transfers;A lot of help with bathing/dressing/bathroom;Assistance with cooking/housework     Functional Status Assessment  Patient has had a recent decline in their functional status and demonstrates the ability to make significant improvements in function in a reasonable and predictable amount of time.  Equipment Recommendations  None recommended by OT    Recommendations for Other Services       Precautions / Restrictions Precautions Precautions: Fall;Shoulder Type of Shoulder Precautions: No specific orders given - one would assume sling at all times and NWB Shoulder Interventions: Shoulder sling/immobilizer Required Braces or Orthoses: Sling Restrictions Weight Bearing Restrictions: Yes RUE Weight Bearing: Non weight bearing      Mobility Bed Mobility                    Transfers                          Balance Overall balance assessment: History of Falls                                         ADL either performed or assessed with clinical judgement   ADL Overall ADL's : Needs assistance/impaired Eating/Feeding: Set up;Sitting   Grooming: Sitting;Set up   Upper Body Bathing: Moderate assistance;Sitting   Lower Body Bathing: Minimal assistance;Sit to/from stand   Upper Body Dressing : Maximal assistance;Sitting   Lower Body Dressing: Moderate assistance;Sit to/from stand   Toilet Transfer: Minimal assistance;Grab Haematologist;Ambulation Toilet Transfer Details (indicate cue type and reason): verbal cues to use grab bar Toileting- Clothing Manipulation and Hygiene: Minimal assistance;Sitting/lateral lean Toileting - Clothing Manipulation Details (indicate cue type and reason): for clothing management       General  ADL Comments: hand hold to ambulate to bathroom.     Vision Baseline Vision/History: 1 Wears glasses Patient Visual Report: No change from baseline       Perception     Praxis      Pertinent Vitals/Pain Pain Assessment: Faces Faces Pain Scale: Hurts a little bit Pain Location: R  shoulder Pain Descriptors / Indicators: Discomfort;Sore Pain Intervention(s): Limited activity within patient's tolerance     Hand Dominance Right   Extremity/Trunk Assessment Upper Extremity Assessment Upper Extremity Assessment: RUE deficits/detail;LUE deficits/detail RUE Deficits / Details: functional ROm and strength of elbow, wrist, hand RUE: Unable to fully assess due to immobilization LUE Deficits / Details: WFL ROm, functional strength LUE Sensation: WNL LUE Coordination: WNL   Lower Extremity Assessment Lower Extremity Assessment: Defer to PT evaluation   Cervical / Trunk Assessment Cervical / Trunk Assessment: Kyphotic   Communication Communication Communication: HOH   Cognition Arousal/Alertness: Awake/alert Behavior During Therapy: WFL for tasks assessed/performed Overall Cognitive Status: Within Functional Limits for tasks assessed                                       General Comments       Exercises     Shoulder Instructions      Home Living Family/patient expects to be discharged to:: Skilled nursing facility Living Arrangements: Alone Available Help at Discharge: Family;Available PRN/intermittently Type of Home: Mobile home Home Access: Stairs to enter Entrance Stairs-Number of Steps: 5 Entrance Stairs-Rails: Right;Left Home Layout: One level     Bathroom Shower/Tub: Teacher, early years/pre: Standard Bathroom Accessibility: Yes   Home Equipment: Conservation officer, nature (2 wheels)          Prior Functioning/Environment Prior Level of Function : Independent/Modified Independent             Mobility Comments: walker ADLs Comments: independent        OT Problem List: Decreased strength;Decreased range of motion;Decreased activity tolerance;Impaired balance (sitting and/or standing);Decreased knowledge of use of DME or AE;Decreased safety awareness;Impaired UE functional use;Pain      OT Treatment/Interventions:  Self-care/ADL training;Therapeutic exercise;Patient/family education;Therapeutic activities;Balance training;DME and/or AE instruction    OT Goals(Current goals can be found in the care plan section) Acute Rehab OT Goals Patient Stated Goal: more independent and walking safely without a walker OT Goal Formulation: With family Time For Goal Achievement: 05/08/21 Potential to Achieve Goals: Good  OT Frequency: Min 2X/week    Co-evaluation PT/OT/SLP Co-Evaluation/Treatment: Yes (co eval)            AM-PAC OT "6 Clicks" Daily Activity     Outcome Measure Help from another person eating meals?: A Little Help from another person taking care of personal grooming?: A Little Help from another person toileting, which includes using toliet, bedpan, or urinal?: A Little Help from another person bathing (including washing, rinsing, drying)?: A Lot Help from another person to put on and taking off regular upper body clothing?: A Lot Help from another person to put on and taking off regular lower body clothing?: A Lot 6 Click Score: 15   End of Session Nurse Communication: Mobility status  Activity Tolerance: Patient tolerated treatment well Patient left: in chair;with call bell/phone within reach;with family/visitor present  OT Visit Diagnosis: Unsteadiness on feet (R26.81);History of falling (Z91.81);Pain Pain - Right/Left: Right Pain - part of body: Shoulder  Time: 2003-7944 OT Time Calculation (min): 38 min Charges:  OT General Charges $OT Visit: 1 Visit OT Evaluation $OT Eval Low Complexity: 1 Low OT Treatments $Self Care/Home Management : 8-22 mins  Jarius Dieudonne, OTR/L [place] [phone] Pager: [phone]   Lenward Chancellor 04/24/2021, 4:10 PM

## 2021-04-24 NOTE — ED Triage Notes (Signed)
Pt here from home with c/o right shoulder pain after falling asleep on the toilet and then falling in the floor ,

## 2021-04-24 NOTE — ED Provider Notes (Signed)
[facility] Emergency Department Provider Note MRN:  [id number]  Arrival date & time: 04/24/21     Chief Complaint   Fall History of Present Illness   [name] [name] is a 88 y.o. year-old female with a history of CKD presenting to the ED with chief complaint of fall.  Reportedly patient fell asleep on the toilet and this caused her to fall down.  Fell onto her right shoulder.  Having severe shoulder pain with any movement.  Patient is extremely hard of hearing and no further history could be obtained.   Review of Systems  I was unable to obtain a full/accurate HPI, PMH, or ROS due to the patient's near deafness.  Patient's Health History    Past Medical History:  Diagnosis Date   ALLERGIC RHINITIS 11/02/2006   CKD (chronic kidney disease), stage III ([place])    HYPERLIPIDEMIA 11/04/2006   HYPOTHYROIDISM, POSTSURGICAL 03/28/2007   Malignant neoplasm of thyroid gland ([place]) 03/28/2007   5/07: righte lobectomy--path shows 3 cm follicular ca 4/09: completion left lobectomy, no more tumor seen 09/07: thyroglobulin 0.2 9ab neg) 11/07: i-131 rx, 101 [facility]ci 6/08: thyroglogulin 0.2 (ab neg) , hypothyroid body scan neg (clinically tolerated hypothyroidism poorly) 06/09: tg undetectable (ab=1.5, normal) 12/10: tg undetectable (ab neg) 6/11: tg undectable (ab neg)   OSTEOPENIA 11/02/2006    Past Surgical History:  Procedure Laterality Date   ABDOMINAL HYSTERECTOMY     CERVICAL POLYPECTOMY     OPEN REDUCTION INTERNAL FIXATION (ORIF) DISTAL RADIAL FRACTURE Left 01/14/2014   Procedure: OPEN REDUCTION INTERNAL FIXATION DISTAL RADIAL FRACTURE;  Surgeon: Linna Hoff, MD;  Location: [facility];  Service: Orthopedics;  Laterality: Left;    Family History  Problem Relation Age of Onset   Diabetes Sister     Social History   Socioeconomic History   Marital status: Widowed    Spouse name: Not on file   Number of children: Not on file   Years of education: Not on file   Highest  education level: Not on file  Occupational History   Not on file  Tobacco Use   Smoking status: Never   Smokeless tobacco: Never  Substance and Sexual Activity   Alcohol use: Not Currently   Drug use: Never   Sexual activity: Not on file  Other Topics Concern   Not on file  Social History Narrative   Doesn't drive   Social Determinants of Health   Financial Resource Strain: Not on file  Food Insecurity: Not on file  Transportation Needs: Not on file  Physical Activity: Not on file  Stress: Not on file  Social Connections: Not on file  Intimate Partner Violence: Not on file     Physical Exam   Vitals:   04/24/21 0600 04/24/21 0630  BP: 126/71 139/72  Pulse: 83 78  Resp: (!) 24 17  Temp:    SpO2: 96% 90%    CONSTITUTIONAL: Chronically ill-appearing, NAD NEURO/PSYCH:  Alert and oriented x 3, no focal deficits EYES:  eyes equal and reactive ENT/NECK:  no LAD, no JVD CARDIO: Regular rate, well-perfused, normal S1 and S2 PULM:  CTAB no wheezing or rhonchi GI/GU:  non-distended, non-tender MSK/SPINE: Significant tenderness palpation of the right shoulder, neurovascularly intact distally SKIN:  no rash, atraumatic   *Additional and/or pertinent findings included in MDM below  Diagnostic and Interventional Summary    EKG Interpretation  Date/Time:    Ventricular Rate:    PR Interval:    QRS Duration:  QT Interval:    QTC Calculation:   R Axis:     Text Interpretation:         Labs Reviewed - No data to display  CT Shoulder Right Wo Contrast  Final Result    CT HEAD WO CONTRAST (5MM)  Final Result    CT CERVICAL SPINE WO CONTRAST  Final Result    DG Shoulder Right  Final Result    DG Chest 1 View  Final Result      Medications - No data to display   Procedures  /  Critical Care Procedures  ED Course and Medical Decision Making  Initial Impression and Ddx Fall onto right shoulder with severe pain, suspect fracture or dislocation, awaiting  x-ray.  Patient also had a recent fall causing subdural hematoma, unsure of head trauma during this fall, will repeat CT head.  Past medical/surgical history that increases complexity of ED encounter: Advanced age, osteopenia  Interpretation of Diagnostics I personally reviewed the shoulder x-ray and my interpretation is as follows: Osteopenia, no obvious fractures or dislocation      Patient Reassessment and Ultimate Disposition/Management Patient continues to have significant pain with any movement or palpation of the right shoulder.  Patient's family is now at bedside explaining that she lives alone, uses a walker at baseline.  Will not be able to return home with the current amount of assistance.  CT scan obtained to exclude occult fracture, and it does reveal a nondisplaced glenoid fracture.  Will place in sling and have TOC evaluate for rehab placement.  Patient management required discussion with the following services or consulting groups:  Case Management/Social Work  Complexity of Problems Addressed Acute complicated illness or Injury  Additional Data Reviewed and Analyzed Further history obtained from: EMS on arrival and Further history from spouse/family member  Factors Impacting ED Encounter Risk Consideration of hospitalization  Barth Kirks. [place] [facility] [email or web address]  Final Clinical Impressions(s) / ED Diagnoses     ICD-10-CM   1. Closed fracture of glenoid cavity and neck of right scapula, initial encounter  K16.010X    N23.557D       ED Discharge Orders     None        Discharge Instructions Discussed with and Provided to Patient:   Discharge Instructions   None      Maudie Flakes, MD 04/24/21 [phone]

## 2021-04-24 NOTE — Progress Notes (Signed)
Transition of Care [facility]) - Emergency Department Mini Assessment   Patient Details  Name: [name] [name] MRN: [id number] Date of Birth: March 07, 1928  Transition of Care [facility]) CM/SW Contact:    [name] [name], [place] Phone [name]: 04/24/2021, 9:58 AM   Clinical Narrative: TOC CSW consulted with pt and [name] [name]/pts daughter at bedside.  They are in agreeance with SNF/Rehab.  CSW is awaing PT recommendation.  [name] [name], MSW, LCSW-A Pronouns:  She/Her/Hers [place] Transitions of Care Clinical Social Worker Direct [name]:  [phone] [name]    ED Mini Assessment: What brought you to the Emergency Department? : Fall  Barriers to Discharge: No Barriers Identified     Means of departure: Not know  Interventions which prevented an admission or readmission: SNF Placement    Patient Contact and Communications     Spoke with: [name] [name] Date: 04/24/21,     Contact Phone [name]: [phone] Call outcome: [name] Penny is asking for SNFplacement for rehab.  Patient states their goals for this hospitalization and ongoing recovery are:: Pt wants to go to rehab.   Choice offered to / list presented to : NA  Admission diagnosis:  Fall Patient Active Problem List   Diagnosis Date Noted   Essential hypertension 01/11/2021   Leg edema 12/01/2020   Plantar flexed metatarsal bone of left foot 11/24/2020   Plantar flexed metatarsal bone of right foot 11/24/2020   Chronic sinusitis 07/17/2019   Right leg weakness 10/15/2018   Corns and callus 09/25/2018   Shingles 04/05/2018   HLD (hyperlipidemia) 08/05/2017   CKD (chronic kidney disease), stage III ([place]) 08/05/2017   Numbness 11/19/2014   Malignant neoplasm of thyroid gland ([place]) 03/28/2007   HYPOTHYROIDISM, POSTSURGICAL 03/28/2007   HYPOPARATHYROIDISM 03/28/2007   Allergic rhinitis 11/02/2006   OSTEOPENIA 11/02/2006   PCP:  Vivi Barrack, MD Pharmacy:    [facility] [place] [facility] [place] [address] [place] [address] Phone: [phone] Fax: [phone]

## 2021-04-25 NOTE — ED Notes (Signed)
Patient placed on hospital bed for comfort °

## 2021-04-25 NOTE — ED Notes (Signed)
CSW spoke with pts daughter who states that she would like referral sent to [facility] at [facility]. CSW completed Fl2 and PASRR. Referral sent to [facility]. [facility] has extended a bed offer. CSW started insurance auth. CSW awaiting answer from insurance on auth. CSW updated pts daughter of acceptance at [facility]. TOC to follow.

## 2021-04-25 NOTE — Progress Notes (Signed)
Occupational Therapy Treatment Patient Details Name: [name] [name] MRN: [id number] DOB: Apr 17, 1927 Today's Date: 04/25/2021   History of present illness [name] [name] is a 88 y.o. year-old female with a history of CKD presenting to the ED with chief complaint of fall. CT showed Vertical fracture through the anterior half glenoid nearly dividing  the glenoid on sagittal reformats. No displacement. No glenohumeral  or acromioclavicular dislocation. Severe glenohumeral osteoarthritis with joint collapse and bulky  spurring. Acromioclavicular osteoarthritis with subchondral cysts  and spurring. No erosion or aggressive bone lesion   OT comments  Patient was noted to have stiffness in BLE with increased time needed for transition from edge of bed to sink in room. Patient needed min A for standing balance with consistent reminders to keep RUE on belly during functional tasks. Patient was able to stand for about 3 mins to engage in washing perineal area at sink per patient request. Patient tolerated session well. Patient would continue to benefit from skilled OT services at this time while admitted and after d/c to address noted deficits in order to improve overall safety and independence in ADLs.     Recommendations for follow up therapy are one component of a multi-disciplinary discharge planning process, led by the attending physician.  Recommendations may be updated based on patient status, additional functional criteria and insurance authorization.    Follow Up Recommendations  Skilled nursing-short term rehab (<3 hours/day)    Assistance Recommended at Discharge Frequent or constant Supervision/Assistance  Patient can return home with the following  A little help with walking and/or transfers;A lot of help with bathing/dressing/bathroom;Assistance with cooking/housework   Equipment Recommendations  None recommended by OT    Recommendations for Other Services      Precautions / Restrictions  Precautions Precautions: Fall;Shoulder Type of Shoulder Precautions: No specific orders given - one would assume sling at all times and NWB Shoulder Interventions: Shoulder sling/immobilizer Required Braces or Orthoses: Sling Restrictions Weight Bearing Restrictions: Yes RUE Weight Bearing: Non weight bearing       Mobility Bed Mobility Overal bed mobility: Needs Assistance Bed Mobility: Supine to Sit     Supine to sit: Min assist     General bed mobility comments: mod A for supine to sit on edge of bed with increased time and physical assist for BLE over edge of bed.    Transfers                         Balance Overall balance assessment: History of Falls;Needs assistance Sitting-balance support: No upper extremity supported;Feet supported Sitting balance-Leahy Scale: Good     Standing balance support: No upper extremity supported;During functional activity Standing balance-Leahy Scale: Fair Standing balance comment: occasional LOB                           ADL either performed or assessed with clinical judgement   ADL Overall ADL's : Needs assistance/impaired     Grooming: Wash/dry face;Standing Grooming Details (indicate cue type and reason): at sink in room with min A and cues to keep RUE off sink                   Toilet Transfer Details (indicate cue type and reason): patient was able to transfer from strecher in ER to recliner in room with min A with noted choppy steps. patient reported feeling more stiff today. nursing made aware. Toileting- Water quality scientist  and Hygiene: Sit to/from stand;Minimal assistance Toileting - Clothing Manipulation Details (indicate cue type and reason): for clothing management and hygiene tasks with education not to wipe back to front for UTI prevention. patient verbalized that daughter tells her the same at home       General ADL Comments: patient was one hand held to transfer from bed to sink in  room with patietn able to stand for few mins to engage in washign face and bottom at sink with min A for standing balance    Extremity/Trunk Assessment              Vision       Perception     Praxis      Cognition Arousal/Alertness: Awake/alert Behavior During Therapy: WFL for tasks assessed/performed Overall Cognitive Status: Within Functional Limits for tasks assessed                                 General Comments: very [facility]          Exercises     Shoulder Instructions       General Comments      Pertinent Vitals/ Pain       Pain Assessment: No/denies pain  Home Living                                          Prior Functioning/Environment              Frequency  Min 2X/week        Progress Toward Goals  OT Goals(current goals can now be found in the care plan section)  Progress towards OT goals: Progressing toward goals     Plan Discharge plan remains appropriate    Co-evaluation                 AM-PAC OT "6 Clicks" Daily Activity     Outcome Measure   Help from another person eating meals?: A Little Help from another person taking care of personal grooming?: A Little Help from another person toileting, which includes using toliet, bedpan, or urinal?: A Little Help from another person bathing (including washing, rinsing, drying)?: A Lot Help from another person to put on and taking off regular upper body clothing?: A Lot Help from another person to put on and taking off regular lower body clothing?: A Lot 6 Click Score: 15    End of Session Equipment Utilized During Treatment: Gait belt  OT Visit Diagnosis: Unsteadiness on feet (R26.81);History of falling (Z91.81);Pain Pain - Right/Left: Right Pain - part of body: Shoulder   Activity Tolerance Patient tolerated treatment well   Patient Left in bed;with call bell/phone within reach   Nurse Communication Mobility status        Time:  9169-4503 OT Time Calculation (min): 35 min  Charges: OT General Charges $OT Visit: 1 Visit OT Treatments $Self Care/Home Management : 23-37 mins  Jackelyn Poling OTR/L, MS Acute Rehabilitation Department Office# [phone] Pager# [phone]   Marcellina Millin 04/25/2021, 3:39 PM

## 2021-04-25 NOTE — NC FL2 (Signed)
MEDICAID FL2 LEVEL OF CARE SCREENING TOOL     IDENTIFICATION  Patient Name: [name] [name] Birthdate: [birth year] Sex: female Admission Date (Current Location): 04/24/2021  [facility] and [place] Number:  Herbalist and Address:  [facility],  [place], [place]      Provider Number: [id number]  Attending Physician Name and Address:  Default, Provider, MD  Relative Name and Phone Number:  Debbe Bales (Daughter)   [phone]    Current Level of Care: Hospital Recommended Level of Care: [place] Prior Approval Number:    Date Approved/Denied:   PASRR Number: [id number]  Discharge Plan: SNF    Current Diagnoses: Patient Active Problem List   Diagnosis Date Noted   Essential hypertension 01/11/2021   Leg edema 12/01/2020   Plantar flexed metatarsal bone of left foot 11/24/2020   Plantar flexed metatarsal bone of right foot 11/24/2020   Chronic sinusitis 07/17/2019   Right leg weakness 10/15/2018   Corns and callus 09/25/2018   Shingles 04/05/2018   HLD (hyperlipidemia) 08/05/2017   CKD (chronic kidney disease), stage III ([place]) 08/05/2017   Numbness 11/19/2014   Malignant neoplasm of thyroid gland ([place]) 03/28/2007   HYPOTHYROIDISM, POSTSURGICAL 03/28/2007   HYPOPARATHYROIDISM 03/28/2007   Allergic rhinitis 11/02/2006   OSTEOPENIA 11/02/2006    Orientation RESPIRATION BLADDER Height & Weight     Self, Time, Situation, Place  Normal Continent Weight:   Height:     BEHAVIORAL SYMPTOMS/MOOD NEUROLOGICAL BOWEL NUTRITION STATUS      Continent Diet (Heart Healthy)  AMBULATORY STATUS COMMUNICATION OF NEEDS Skin   Extensive Assist Verbally Normal                       Personal Care Assistance Level of Assistance  Bathing, Feeding, Dressing, Total care Bathing Assistance: Maximum assistance Feeding assistance: Independent Dressing Assistance: Limited assistance Total Care Assistance: Limited  assistance   Functional Limitations Info  Sight, Hearing, Speech Sight Info: Adequate Hearing Info: Impaired Speech Info: Adequate    SPECIAL CARE FACTORS FREQUENCY  PT (By licensed PT), OT (By licensed OT)     PT Frequency: 5 times weekly OT Frequency: 5 times weekly            Contractures Contractures Info: Not present    Additional Factors Info  Code Status, Allergies Code Status Info: FULL Allergies Info: Verapamil           Current Medications (04/25/2021):  This is the current hospital active medication list Current Facility-Administered Medications  Medication Dose Route Frequency Provider Last Rate Last Admin   calcitRIOL (ROCALTROL) capsule 0.5 mcg  0.5 mcg Oral Daily Isla Pence, MD   0.5 mcg at 04/25/21 5093   docusate sodium (COLACE) capsule 100 mg  100 mg Oral QHS Isla Pence, MD       ferrous sulfate tablet 325 mg  325 mg Oral Q breakfast Isla Pence, MD   325 mg at 04/25/21 0830   furosemide (LASIX) tablet 40 mg  40 mg Oral BID Isla Pence, MD   40 mg at 04/25/21 0830   HYDROcodone-acetaminophen (NORCO/VICODIN) 5-325 MG per tablet 1 tablet  1 tablet Oral Q6H PRN Isla Pence, MD   1 tablet at 04/24/21 1531   levothyroxine (SYNTHROID) tablet 112 mcg  112 mcg Oral Q0600 Minda Ditto, RPH   112 mcg at 04/25/21 0830   multivitamin with minerals tablet 1 tablet  1 tablet Oral Daily Isla Pence, MD  1 tablet at 04/25/21 6060   Current Outpatient Medications  Medication Sig Dispense Refill   calcitRIOL (ROCALTROL) 0.5 MCG capsule Take 1 capsule (0.5 mcg total) by mouth daily. 90 capsule 3   docusate sodium (COLACE) 100 MG capsule Take 100 mg by mouth at bedtime.      furosemide (LASIX) 40 MG tablet Take 1 tablet (40 mg total) by mouth 2 (two) times daily. 180 tablet 3   levothyroxine (SYNTHROID) 112 MCG tablet Take 1 tablet (112 mcg total) by mouth daily. 90 tablet 3   triamcinolone ointment (KENALOG) 0.5 % Apply 1 application topically  2 (two) times daily. (Patient not taking: Reported on 04/24/2021) 30 g 0   valACYclovir (VALTREX) 1000 MG tablet Take 1 tablet (1,000 mg total) by mouth daily. Take 1 pill daily for 5 days for outbreaks. (Patient not taking: Reported on 04/24/2021) 30 tablet 1   vitamin C (ASCORBIC ACID) 500 MG tablet Take 1 tablet (500 mg total) by mouth daily. (Patient not taking: Reported on 04/24/2021) 50 tablet 0     Discharge Medications: Please see discharge summary for a list of discharge medications.  Relevant Imaging Results:  Relevant Lab Results:   Additional Information SSN: [id number] [place]

## 2021-04-25 NOTE — ED Notes (Signed)
Pt given lunch tray.

## 2021-04-25 NOTE — ED Notes (Signed)
PT/OT at bedside.

## 2021-04-25 NOTE — ED Notes (Signed)
Patient is resting comfortably. 

## 2021-04-25 NOTE — ED Notes (Signed)
Pt appears to be resting comfortably, even rise and fall of chest noted.

## 2021-04-26 DIAGNOSIS — R54 Age-related physical debility: Secondary | ICD-10-CM | POA: Diagnosis not present

## 2021-04-26 DIAGNOSIS — R279 Unspecified lack of coordination: Secondary | ICD-10-CM | POA: Diagnosis not present

## 2021-04-26 DIAGNOSIS — M6281 Muscle weakness (generalized): Secondary | ICD-10-CM | POA: Diagnosis not present

## 2021-04-26 DIAGNOSIS — Z79899 Other long term (current) drug therapy: Secondary | ICD-10-CM | POA: Diagnosis not present

## 2021-04-26 DIAGNOSIS — Z8619 Personal history of other infectious and parasitic diseases: Secondary | ICD-10-CM | POA: Diagnosis not present

## 2021-04-26 DIAGNOSIS — R079 Chest pain, unspecified: Secondary | ICD-10-CM | POA: Diagnosis not present

## 2021-04-26 DIAGNOSIS — E890 Postprocedural hypothyroidism: Secondary | ICD-10-CM | POA: Diagnosis not present

## 2021-04-26 DIAGNOSIS — S42151D Displaced fracture of neck of scapula, right shoulder, subsequent encounter for fracture with routine healing: Secondary | ICD-10-CM | POA: Diagnosis not present

## 2021-04-26 DIAGNOSIS — N183 Chronic kidney disease, stage 3 unspecified: Secondary | ICD-10-CM | POA: Diagnosis not present

## 2021-04-26 DIAGNOSIS — Z7401 Bed confinement status: Secondary | ICD-10-CM | POA: Diagnosis not present

## 2021-04-26 DIAGNOSIS — I5022 Chronic systolic (congestive) heart failure: Secondary | ICD-10-CM | POA: Diagnosis not present

## 2021-04-26 DIAGNOSIS — M79644 Pain in right finger(s): Secondary | ICD-10-CM | POA: Diagnosis not present

## 2021-04-26 DIAGNOSIS — R2681 Unsteadiness on feet: Secondary | ICD-10-CM | POA: Diagnosis not present

## 2021-04-26 DIAGNOSIS — S4291XA Fracture of right shoulder girdle, part unspecified, initial encounter for closed fracture: Secondary | ICD-10-CM | POA: Diagnosis not present

## 2021-04-26 DIAGNOSIS — E039 Hypothyroidism, unspecified: Secondary | ICD-10-CM | POA: Diagnosis not present

## 2021-04-26 DIAGNOSIS — Z741 Need for assistance with personal care: Secondary | ICD-10-CM | POA: Diagnosis not present

## 2021-04-26 DIAGNOSIS — Z85850 Personal history of malignant neoplasm of thyroid: Secondary | ICD-10-CM | POA: Diagnosis not present

## 2021-04-26 DIAGNOSIS — S42414D Nondisplaced simple supracondylar fracture without intercondylar fracture of right humerus, subsequent encounter for fracture with routine healing: Secondary | ICD-10-CM | POA: Diagnosis not present

## 2021-04-26 DIAGNOSIS — I10 Essential (primary) hypertension: Secondary | ICD-10-CM | POA: Diagnosis not present

## 2021-04-26 DIAGNOSIS — S42141D Displaced fracture of glenoid cavity of scapula, right shoulder, subsequent encounter for fracture with routine healing: Secondary | ICD-10-CM | POA: Diagnosis not present

## 2021-04-26 DIAGNOSIS — N1831 Chronic kidney disease, stage 3a: Secondary | ICD-10-CM | POA: Diagnosis not present

## 2021-04-26 DIAGNOSIS — E785 Hyperlipidemia, unspecified: Secondary | ICD-10-CM | POA: Diagnosis not present

## 2021-04-26 DIAGNOSIS — R531 Weakness: Secondary | ICD-10-CM | POA: Diagnosis not present

## 2021-04-26 DIAGNOSIS — W19XXXD Unspecified fall, subsequent encounter: Secondary | ICD-10-CM | POA: Diagnosis not present

## 2021-04-26 DIAGNOSIS — Z9181 History of falling: Secondary | ICD-10-CM | POA: Diagnosis not present

## 2021-04-26 DIAGNOSIS — M19011 Primary osteoarthritis, right shoulder: Secondary | ICD-10-CM | POA: Diagnosis not present

## 2021-04-26 DIAGNOSIS — M25511 Pain in right shoulder: Secondary | ICD-10-CM | POA: Diagnosis not present

## 2021-04-26 DIAGNOSIS — K5900 Constipation, unspecified: Secondary | ICD-10-CM | POA: Diagnosis not present

## 2021-04-26 DIAGNOSIS — S42141A Displaced fracture of glenoid cavity of scapula, right shoulder, initial encounter for closed fracture: Secondary | ICD-10-CM | POA: Diagnosis not present

## 2021-04-26 NOTE — ED Notes (Signed)
Call placed to [facility] for transport.

## 2021-04-26 NOTE — Progress Notes (Signed)
Pt's room assignment 207, call report [phone].  Arlie Solomons.Shela Esses, MSW, [place]   Transitions of Care Clinical Social Worker I Direct Dial: [phone]   Fax: [phone] [email or web address]

## 2021-04-26 NOTE — ED Notes (Signed)
Pts insurance [name] [name] has been approved for SNF at [facility]. She is approved fr 7 days starting on 1/16, the next review date will be 1/22. Auth reference ID for SNF is [id number]. Auth reference ID for EMS transport to SNF id [id number]. TOC to follow.

## 2021-04-26 NOTE — ED Notes (Signed)
Pt was given hospital bed for comfort and warm blankets

## 2021-04-26 NOTE — ED Notes (Signed)
Went to check on pt and she could not reach call bell and was concerned about having not gotten a meal, conversation difficult due to pt HOH but then we realized that she thought it was 3pm and once we were able to clear that up pt states that she feels better, repositioned, call bell and phone given and some sips of water, bedside table in reach with crackers and water.  EMptied purewick container and checked pt brief, she was dry.

## 2021-04-27 ENCOUNTER — Ambulatory Visit: Payer: PPO | Admitting: Cardiology

## 2021-04-27 DIAGNOSIS — Z79899 Other long term (current) drug therapy: Secondary | ICD-10-CM | POA: Diagnosis not present

## 2021-04-28 DIAGNOSIS — E039 Hypothyroidism, unspecified: Secondary | ICD-10-CM | POA: Diagnosis not present

## 2021-04-28 DIAGNOSIS — S4291XA Fracture of right shoulder girdle, part unspecified, initial encounter for closed fracture: Secondary | ICD-10-CM | POA: Diagnosis not present

## 2021-04-28 DIAGNOSIS — R54 Age-related physical debility: Secondary | ICD-10-CM | POA: Diagnosis not present

## 2021-04-28 DIAGNOSIS — E785 Hyperlipidemia, unspecified: Secondary | ICD-10-CM | POA: Diagnosis not present

## 2021-04-28 DIAGNOSIS — K5900 Constipation, unspecified: Secondary | ICD-10-CM | POA: Diagnosis not present

## 2021-04-28 DIAGNOSIS — Z741 Need for assistance with personal care: Secondary | ICD-10-CM | POA: Diagnosis not present

## 2021-04-28 DIAGNOSIS — N1831 Chronic kidney disease, stage 3a: Secondary | ICD-10-CM | POA: Diagnosis not present

## 2021-04-28 DIAGNOSIS — I10 Essential (primary) hypertension: Secondary | ICD-10-CM | POA: Diagnosis not present

## 2021-04-28 DIAGNOSIS — S42141D Displaced fracture of glenoid cavity of scapula, right shoulder, subsequent encounter for fracture with routine healing: Secondary | ICD-10-CM | POA: Diagnosis not present

## 2021-05-06 DIAGNOSIS — M79644 Pain in right finger(s): Secondary | ICD-10-CM | POA: Diagnosis not present

## 2021-05-06 DIAGNOSIS — M25511 Pain in right shoulder: Secondary | ICD-10-CM | POA: Diagnosis not present

## 2021-05-11 ENCOUNTER — Ambulatory Visit: Payer: PPO | Admitting: Family Medicine

## 2021-05-12 DIAGNOSIS — M25511 Pain in right shoulder: Secondary | ICD-10-CM | POA: Diagnosis not present

## 2021-05-18 DIAGNOSIS — I5022 Chronic systolic (congestive) heart failure: Secondary | ICD-10-CM | POA: Diagnosis not present

## 2021-05-18 DIAGNOSIS — Z79899 Other long term (current) drug therapy: Secondary | ICD-10-CM | POA: Diagnosis not present

## 2021-05-24 DIAGNOSIS — Z79899 Other long term (current) drug therapy: Secondary | ICD-10-CM | POA: Diagnosis not present

## 2021-05-24 DIAGNOSIS — I5022 Chronic systolic (congestive) heart failure: Secondary | ICD-10-CM | POA: Diagnosis not present

## 2021-05-24 DIAGNOSIS — S42414D Nondisplaced simple supracondylar fracture without intercondylar fracture of right humerus, subsequent encounter for fracture with routine healing: Secondary | ICD-10-CM | POA: Diagnosis not present

## 2021-05-24 DIAGNOSIS — K5900 Constipation, unspecified: Secondary | ICD-10-CM | POA: Diagnosis not present

## 2021-05-24 DIAGNOSIS — E039 Hypothyroidism, unspecified: Secondary | ICD-10-CM | POA: Diagnosis not present

## 2021-05-27 DIAGNOSIS — R54 Age-related physical debility: Secondary | ICD-10-CM | POA: Diagnosis not present

## 2021-05-27 DIAGNOSIS — E039 Hypothyroidism, unspecified: Secondary | ICD-10-CM | POA: Diagnosis not present

## 2021-05-27 DIAGNOSIS — M19011 Primary osteoarthritis, right shoulder: Secondary | ICD-10-CM | POA: Diagnosis not present

## 2021-05-27 DIAGNOSIS — K5900 Constipation, unspecified: Secondary | ICD-10-CM | POA: Diagnosis not present

## 2021-05-29 DIAGNOSIS — S42151D Displaced fracture of neck of scapula, right shoulder, subsequent encounter for fracture with routine healing: Secondary | ICD-10-CM | POA: Diagnosis not present

## 2021-05-29 DIAGNOSIS — M8580 Other specified disorders of bone density and structure, unspecified site: Secondary | ICD-10-CM | POA: Diagnosis not present

## 2021-05-29 DIAGNOSIS — E785 Hyperlipidemia, unspecified: Secondary | ICD-10-CM | POA: Diagnosis not present

## 2021-05-29 DIAGNOSIS — J309 Allergic rhinitis, unspecified: Secondary | ICD-10-CM | POA: Diagnosis not present

## 2021-05-29 DIAGNOSIS — I129 Hypertensive chronic kidney disease with stage 1 through stage 4 chronic kidney disease, or unspecified chronic kidney disease: Secondary | ICD-10-CM | POA: Diagnosis not present

## 2021-05-29 DIAGNOSIS — N183 Chronic kidney disease, stage 3 unspecified: Secondary | ICD-10-CM | POA: Diagnosis not present

## 2021-05-29 DIAGNOSIS — E890 Postprocedural hypothyroidism: Secondary | ICD-10-CM | POA: Diagnosis not present

## 2021-05-29 DIAGNOSIS — M19011 Primary osteoarthritis, right shoulder: Secondary | ICD-10-CM | POA: Diagnosis not present

## 2021-05-29 DIAGNOSIS — Z9181 History of falling: Secondary | ICD-10-CM | POA: Diagnosis not present

## 2021-05-29 DIAGNOSIS — E209 Hypoparathyroidism, unspecified: Secondary | ICD-10-CM | POA: Diagnosis not present

## 2021-05-29 DIAGNOSIS — S42141D Displaced fracture of glenoid cavity of scapula, right shoulder, subsequent encounter for fracture with routine healing: Secondary | ICD-10-CM | POA: Diagnosis not present

## 2021-05-29 DIAGNOSIS — Z85850 Personal history of malignant neoplasm of thyroid: Secondary | ICD-10-CM | POA: Diagnosis not present

## 2021-06-01 ENCOUNTER — Ambulatory Visit: Payer: PPO | Admitting: Podiatry

## 2021-06-02 DIAGNOSIS — S42141D Displaced fracture of glenoid cavity of scapula, right shoulder, subsequent encounter for fracture with routine healing: Secondary | ICD-10-CM | POA: Diagnosis not present

## 2021-06-02 DIAGNOSIS — Z9181 History of falling: Secondary | ICD-10-CM | POA: Diagnosis not present

## 2021-06-02 DIAGNOSIS — E785 Hyperlipidemia, unspecified: Secondary | ICD-10-CM | POA: Diagnosis not present

## 2021-06-02 DIAGNOSIS — E890 Postprocedural hypothyroidism: Secondary | ICD-10-CM | POA: Diagnosis not present

## 2021-06-02 DIAGNOSIS — M8580 Other specified disorders of bone density and structure, unspecified site: Secondary | ICD-10-CM | POA: Diagnosis not present

## 2021-06-02 DIAGNOSIS — J309 Allergic rhinitis, unspecified: Secondary | ICD-10-CM | POA: Diagnosis not present

## 2021-06-02 DIAGNOSIS — S42151D Displaced fracture of neck of scapula, right shoulder, subsequent encounter for fracture with routine healing: Secondary | ICD-10-CM | POA: Diagnosis not present

## 2021-06-02 DIAGNOSIS — N183 Chronic kidney disease, stage 3 unspecified: Secondary | ICD-10-CM | POA: Diagnosis not present

## 2021-06-02 DIAGNOSIS — E209 Hypoparathyroidism, unspecified: Secondary | ICD-10-CM | POA: Diagnosis not present

## 2021-06-02 DIAGNOSIS — Z85850 Personal history of malignant neoplasm of thyroid: Secondary | ICD-10-CM | POA: Diagnosis not present

## 2021-06-02 DIAGNOSIS — M19011 Primary osteoarthritis, right shoulder: Secondary | ICD-10-CM | POA: Diagnosis not present

## 2021-06-02 DIAGNOSIS — I129 Hypertensive chronic kidney disease with stage 1 through stage 4 chronic kidney disease, or unspecified chronic kidney disease: Secondary | ICD-10-CM | POA: Diagnosis not present

## 2021-06-06 ENCOUNTER — Telehealth: Payer: Self-pay

## 2021-06-06 ENCOUNTER — Other Ambulatory Visit: Payer: Self-pay

## 2021-06-06 ENCOUNTER — Ambulatory Visit (INDEPENDENT_AMBULATORY_CARE_PROVIDER_SITE_OTHER): Payer: PPO | Admitting: Family Medicine

## 2021-06-06 ENCOUNTER — Encounter: Payer: Self-pay | Admitting: Family Medicine

## 2021-06-06 VITALS — BP 152/75 | HR 77 | Temp 97.7°F | Ht 63.0 in | Wt 127.0 lb

## 2021-06-06 DIAGNOSIS — J309 Allergic rhinitis, unspecified: Secondary | ICD-10-CM | POA: Diagnosis not present

## 2021-06-06 DIAGNOSIS — R600 Localized edema: Secondary | ICD-10-CM | POA: Diagnosis not present

## 2021-06-06 DIAGNOSIS — M25511 Pain in right shoulder: Secondary | ICD-10-CM

## 2021-06-06 DIAGNOSIS — I10 Essential (primary) hypertension: Secondary | ICD-10-CM

## 2021-06-06 NOTE — Assessment & Plan Note (Signed)
Slightly above goal though typically well controlled.  We will follow-up in 3 months.  He will continue home monitoring.

## 2021-06-06 NOTE — Progress Notes (Signed)
° °  [name] [name] is a 88 y.o. female who presents today for an office visit.  Assessment/Plan:  New/Acute Problems: Right Shoulder Pain Doing well.  We will continue working with physical therapy.  May consider referral to sports medicine orthopedics if this does not continue to improve.  We discussed reasons to return to care.  Advised patient to let us know if she is still having issues with a home health referral for next week or so, and we can try a referral from our office.  Chronic Problems Addressed Today: Allergic rhinitis Not controlled.  Would not like nasal spray.  Recommended over-the-counter Claritin or Zyrtec.  Essential hypertension Slightly above goal though typically well controlled.  We will follow-up in 3 months.  He will continue home monitoring.  Leg edema Small mount of edema today.  We will go back to Lasix 40 mg twice daily.  She has done well with this.  Follow-up in 3 months.     Subjective:  HPI:  Patient here for SNF follow up.  She presented to the ED on 04/24/2021 with fall and shoulder pain.  She fell asleep on the toilet and then fell on her right shoulder.  Had work-up in the ED including CT scan which showed nondisplaced glenoid fracture.  CT head was negative.  PT was consulted recommended discharge to SNF.  She was at the rehab facility for about 27 days. She worked with with therapy while there. She feels like her shoulder has mostly improved. She still has some soreness but this is manageable. She has decent range of motion. She was discharged home about week ago. She has been working with [facility] once since then.  While in SNF they changed her Lasix to 60 mg daily.  She has noticed a little more swelling with this.  They are still waiting on insurance approval.  She is otherwise doing well.         Objective:  Physical Exam: BP (!) 152/75 (BP Location: Left Arm)    Pulse 77    Temp 97.7 F (36.5 C) (Temporal)    Ht 5\' 3"  (1.6 m)    Wt 127 lb (57.6 kg)     SpO2 98%    BMI 22.50 kg/m   Gen: No acute distress, resting comfortably CV: Regular rate and rhythm with no murmurs appreciated Pulm: Normal work of breathing, clear to auscultation bilaterally with no crackles, wheezes, or rhonchi MSK: 1+ pitting edema to bilateral feet bilaterally. Neuro: Grossly normal, moves all extremities Psych: Normal affect and thought content  Time Spent: 45 minutes of total time was spent on the date of the encounter performing the following actions: chart review prior to seeing the patient including recent ED visit, obtaining history, performing a medically necessary exam, counseling on the treatment plan, placing orders, and documenting in our EHR.        Algis Greenhouse. Jerline Pain, MD 06/06/2021 9:51 AM

## 2021-06-06 NOTE — Patient Instructions (Signed)
It was very nice to see you today!  Please go back to lasix twice daily.  Please try clairitin or zyrtec to help with the allergies.   Keep an eye on your blood pressure and let me know if it is persistently 140/90 or higher.  Please come back in 3 months for a follow up visit. Come back sooner if needed.   Take care, Dr Jerline Pain  PLEASE NOTE:  If you had any lab tests please let us know if you have not heard back within a few days. You may see your results on mychart before we have a chance to review them but we will give you a call once they are reviewed by [place]. If we ordered any referrals today, please let us know if you have not heard from their office within the next week.   Please try these tips to maintain a healthy lifestyle:  Eat at least 3 REAL meals and 1-2 snacks per day.  Aim for no more than 5 hours between eating.  If you eat breakfast, please do so within one hour of getting up.   Each meal should contain half fruits/vegetables, one quarter protein, and one quarter carbs (no bigger than a computer mouse)  Cut down on sweet beverages. This includes juice, soda, and sweet tea.   Drink at least 1 glass of water with each meal and aim for at least 8 glasses per day  Exercise at least 150 minutes every week.

## 2021-06-06 NOTE — Assessment & Plan Note (Signed)
Not controlled.  Would not like nasal spray.  Recommended over-the-counter Claritin or Zyrtec.

## 2021-06-06 NOTE — Assessment & Plan Note (Signed)
Small mount of edema today.  We will go back to Lasix 40 mg twice daily.  She has done well with this.  Follow-up in 3 months.

## 2021-06-06 NOTE — Telephone Encounter (Signed)
..  Home Health Certification or Plan of Care Tracking  Is this a Certification or Plan of Care? yes  [place] Agency: [facility] Order Number:  [id number]  Has charge sheet been attached?  Where has form been placed:  in provider folder   Faxed to:

## 2021-06-07 NOTE — Telephone Encounter (Signed)
Provider received forms and being signed to be faxed.

## 2021-06-10 ENCOUNTER — Telehealth: Payer: Self-pay | Admitting: Family Medicine

## 2021-06-10 NOTE — Telephone Encounter (Signed)
Copied from [place] [phone]. Topic: Medicare AWV ?>> Jun 10, 2021 11:17 AM Harris-Coley, [name] Beat wrote: ?Reason for CRM: Left message for patient to schedule Annual Wellness Visit.  Please schedule with Nurse Health Advisor Charlott Rakes, RN at [facility].  Please call [phone] ask for Juliann Pulse ?

## 2021-06-16 DIAGNOSIS — E785 Hyperlipidemia, unspecified: Secondary | ICD-10-CM | POA: Diagnosis not present

## 2021-06-16 DIAGNOSIS — S42151D Displaced fracture of neck of scapula, right shoulder, subsequent encounter for fracture with routine healing: Secondary | ICD-10-CM | POA: Diagnosis not present

## 2021-06-16 DIAGNOSIS — E890 Postprocedural hypothyroidism: Secondary | ICD-10-CM | POA: Diagnosis not present

## 2021-06-16 DIAGNOSIS — I129 Hypertensive chronic kidney disease with stage 1 through stage 4 chronic kidney disease, or unspecified chronic kidney disease: Secondary | ICD-10-CM | POA: Diagnosis not present

## 2021-06-16 DIAGNOSIS — N183 Chronic kidney disease, stage 3 unspecified: Secondary | ICD-10-CM | POA: Diagnosis not present

## 2021-06-16 DIAGNOSIS — S42141D Displaced fracture of glenoid cavity of scapula, right shoulder, subsequent encounter for fracture with routine healing: Secondary | ICD-10-CM | POA: Diagnosis not present

## 2021-06-16 DIAGNOSIS — M8580 Other specified disorders of bone density and structure, unspecified site: Secondary | ICD-10-CM | POA: Diagnosis not present

## 2021-06-16 DIAGNOSIS — M19011 Primary osteoarthritis, right shoulder: Secondary | ICD-10-CM | POA: Diagnosis not present

## 2021-06-16 DIAGNOSIS — J309 Allergic rhinitis, unspecified: Secondary | ICD-10-CM | POA: Diagnosis not present

## 2021-06-16 DIAGNOSIS — E209 Hypoparathyroidism, unspecified: Secondary | ICD-10-CM | POA: Diagnosis not present

## 2021-06-16 DIAGNOSIS — Z9181 History of falling: Secondary | ICD-10-CM | POA: Diagnosis not present

## 2021-06-16 DIAGNOSIS — Z85850 Personal history of malignant neoplasm of thyroid: Secondary | ICD-10-CM | POA: Diagnosis not present

## 2021-07-12 DIAGNOSIS — M8580 Other specified disorders of bone density and structure, unspecified site: Secondary | ICD-10-CM | POA: Diagnosis not present

## 2021-07-12 DIAGNOSIS — M19011 Primary osteoarthritis, right shoulder: Secondary | ICD-10-CM | POA: Diagnosis not present

## 2021-07-12 DIAGNOSIS — N183 Chronic kidney disease, stage 3 unspecified: Secondary | ICD-10-CM | POA: Diagnosis not present

## 2021-07-12 DIAGNOSIS — I129 Hypertensive chronic kidney disease with stage 1 through stage 4 chronic kidney disease, or unspecified chronic kidney disease: Secondary | ICD-10-CM | POA: Diagnosis not present

## 2021-07-12 DIAGNOSIS — J309 Allergic rhinitis, unspecified: Secondary | ICD-10-CM | POA: Diagnosis not present

## 2021-07-12 DIAGNOSIS — E785 Hyperlipidemia, unspecified: Secondary | ICD-10-CM | POA: Diagnosis not present

## 2021-07-12 DIAGNOSIS — Z85850 Personal history of malignant neoplasm of thyroid: Secondary | ICD-10-CM | POA: Diagnosis not present

## 2021-07-12 DIAGNOSIS — E209 Hypoparathyroidism, unspecified: Secondary | ICD-10-CM | POA: Diagnosis not present

## 2021-07-12 DIAGNOSIS — E890 Postprocedural hypothyroidism: Secondary | ICD-10-CM | POA: Diagnosis not present

## 2021-07-12 DIAGNOSIS — Z9181 History of falling: Secondary | ICD-10-CM | POA: Diagnosis not present

## 2021-07-12 DIAGNOSIS — S42151D Displaced fracture of neck of scapula, right shoulder, subsequent encounter for fracture with routine healing: Secondary | ICD-10-CM | POA: Diagnosis not present

## 2021-07-12 DIAGNOSIS — S42141D Displaced fracture of glenoid cavity of scapula, right shoulder, subsequent encounter for fracture with routine healing: Secondary | ICD-10-CM | POA: Diagnosis not present

## 2021-07-17 ENCOUNTER — Other Ambulatory Visit: Payer: Self-pay | Admitting: Endocrinology

## 2021-07-26 ENCOUNTER — Ambulatory Visit: Payer: PPO | Admitting: Endocrinology

## 2021-07-26 VITALS — BP 132/82 | HR 77 | Ht 63.0 in | Wt 129.6 lb

## 2021-07-26 DIAGNOSIS — C73 Malignant neoplasm of thyroid gland: Secondary | ICD-10-CM | POA: Diagnosis not present

## 2021-07-26 DIAGNOSIS — E2089 Other specified hypoparathyroidism: Secondary | ICD-10-CM

## 2021-07-26 DIAGNOSIS — E208 Other hypoparathyroidism: Secondary | ICD-10-CM

## 2021-07-26 LAB — VITAMIN D 25 HYDROXY (VIT D DEFICIENCY, FRACTURES): VITD: 37.3 ng/mL (ref 30.00–100.00)

## 2021-07-26 LAB — T4, FREE: Free T4: 1.21 ng/dL (ref 0.60–1.60)

## 2021-07-26 LAB — TSH: TSH: 0.51 u[IU]/mL (ref 0.35–5.50)

## 2021-07-26 NOTE — Patient Instructions (Addendum)
Blood tests are requested for you today.  We'll let you know about the results.   ?You should have an endocrinology follow-up appointment in 6 months.   ?

## 2021-07-26 NOTE — Progress Notes (Signed)
? ?Subjective:  ? ? Patient ID: [name] [name], female    DOB: 07/21/1927, 88 y.o.   MRN: [id number] ? ?HPI ?Pt returns for f/u of Stage-2 FTC:    ?5/07:  right lobectomy--path shows 3 cm right lobe follicular ca (T2 N0 M0).   ?8/07: completion left lobectomy, no more tumor seen.   ?9/07  thyroglobulin 11.8 (ab neg).   ?11/07: RAI, 101 mCi ?6/08 thyroglogulin 0.2 (ab neg).   ?6/08: hypothyroid body scan neg (she tolerated thyroid hormone withdrawal poorly).   ?6/09 tg undetectable (ab=1.5) ?12/10: tg undetectable (ab neg) ?6/11:  tg undetectable (ab neg) ?6/12: tg undetectable (ab neg) ?6/13: tg undetectable (ab neg) ?6/15: tg undetectable (ab neg) ?6/16: tg undetectable (ab neg).   ?8/17: tg=0.1 (ab neg).  ?2/19: tg undetectable (ab neg). ?5/20: tg undetectable (ab neg).  ?3/21 tg undetectable (ab neg).  ?10/22 tg undetectable (ab neg).  ?She denies any nodule at the neck.  ?Postsurgical hypothyroidism (due to SVT, she is not a candidate for TSH suppression): she takes synthroid as rx'ed.   ?Postsurgical hypoparathyroidism (she takes rocaltrol; due to SVT, goal Ca++ is in normal range).   Pt says she takes rocaltrol as rx'ed.  She does not take Vit-D.   ?Past Medical History:  ?Diagnosis Date  ? ALLERGIC RHINITIS 11/02/2006  ? CKD (chronic kidney disease), stage III ([place])   ? HYPERLIPIDEMIA 11/04/2006  ? HYPOTHYROIDISM, POSTSURGICAL 03/28/2007  ? Malignant neoplasm of thyroid gland ([place]) 03/28/2007  ? 5/07: righte lobectomy--path shows 3 cm follicular ca 3/08: completion left lobectomy, no more tumor seen 09/07: thyroglobulin 0.2 9ab neg) 11/07: i-131 rx, 101 mci 6/08: thyroglogulin 0.2 (ab neg) , hypothyroid body scan neg (clinically tolerated hypothyroidism poorly) 06/09: tg undetectable (ab=1.5, normal) 12/10: tg undetectable (ab neg) 6/11: tg undectable (ab neg)  ? OSTEOPENIA 11/02/2006  ? ? ?Past Surgical History:  ?Procedure Laterality Date  ? ABDOMINAL HYSTERECTOMY    ? CERVICAL POLYPECTOMY    ? OPEN REDUCTION  INTERNAL FIXATION (ORIF) DISTAL RADIAL FRACTURE Left 01/14/2014  ? Procedure: OPEN REDUCTION INTERNAL FIXATION DISTAL RADIAL FRACTURE;  Surgeon: Linna Hoff, MD;  Location: [facility];  Service: Orthopedics;  Laterality: Left;  ? ? ?Social History  ? ?Socioeconomic History  ? Marital status: Widowed  ?  Spouse name: Not on file  ? Number of children: Not on file  ? Years of education: Not on file  ? Highest education level: Not on file  ?Occupational History  ? Not on file  ?Tobacco Use  ? Smoking status: Never  ? Smokeless tobacco: Never  ?Substance and Sexual Activity  ? Alcohol use: Not Currently  ? Drug use: Never  ? Sexual activity: Not on file  ?Other Topics Concern  ? Not on file  ?Social History Narrative  ? Doesn't drive  ? ?Social Determinants of Health  ? ?Financial Resource Strain: Not on file  ?Food Insecurity: Not on file  ?Transportation Needs: Not on file  ?Physical Activity: Not on file  ?Stress: Not on file  ?Social Connections: Not on file  ?Intimate Partner Violence: Not on file  ? ? ?Current Outpatient Medications on File Prior to Visit  ?Medication Sig Dispense Refill  ? calcitRIOL (ROCALTROL) 0.5 MCG capsule Take 1 capsule (0.5 mcg total) by mouth daily. 90 capsule 3  ? docusate sodium (COLACE) 100 MG capsule Take 100 mg by mouth at bedtime.     ? furosemide (LASIX) 40 MG tablet Take 1 tablet (40 mg total)  by mouth 2 (two) times daily. 180 tablet 3  ? levothyroxine (SYNTHROID) 112 MCG tablet Take 1 tablet by mouth once daily 90 tablet 0  ? triamcinolone ointment (KENALOG) 0.5 % Apply 1 application topically 2 (two) times daily. 30 g 0  ? valACYclovir (VALTREX) 1000 MG tablet Take 1 tablet (1,000 mg total) by mouth daily. Take 1 pill daily for 5 days for outbreaks. 30 tablet 1  ? vitamin C (ASCORBIC ACID) 500 MG tablet Take 1 tablet (500 mg total) by mouth daily. 50 tablet 0  ? ?No current facility-administered medications on file prior to visit.  ? ? ?Allergies  ?Allergen Reactions  ? Verapamil  Other (See Comments)  ?  edema   ? ? ?Family History  ?Problem Relation Age of Onset  ? Diabetes Sister   ? ? ?BP 132/82 (BP Location: Left Arm, Patient Position: Sitting, Cuff Size: Normal)   Pulse 77   Ht _0  (1.6 m)   Wt 129 lb 9.6 oz (58.8 kg)   SpO2 100%   BMI 22.96 kg/m?  ? ? ?Review of Systems ?Denies palpitations ?   ?Objective:  ? Physical Exam ?VITAL SIGNS:  See vs page ?GENERAL: no distress ?Neck: a healed scar is present.  I do not appreciate a nodule in the thyroid or elsewhere in the neck.  ? ? ?Lab Results  ?Component Value Date  ? TSH 0.51 07/26/2021  ? ?Lab Results  ?Component Value Date  ? PTH 13 (L) 07/26/2021  ? CALCIUM 8.7 07/26/2021  ? PHOS 3.7 10/16/2018  ? ? ?   ?Assessment & Plan:  ?PTC: recheck labs today ?Hypothyroidism: well-controlled.  Please continue the same synthroid ?Postsurgical hypoparathyroidism: well-controlled.  Please continue the same rocaltrol ? ?

## 2021-07-27 LAB — THYROGLOBULIN ANTIBODY: Thyroglobulin Ab: <1 IU/mL (ref <=1)

## 2021-07-27 LAB — PTH, INTACT AND CALCIUM
Calcium: 8.7 mg/dL (ref 8.6–10.4)
PTH: 13 pg/mL — ABNORMAL LOW (ref 16–77)

## 2021-07-27 LAB — THYROGLOBULIN LEVEL: Thyroglobulin: <0.1 ng/mL — ABNORMAL LOW

## 2021-08-02 ENCOUNTER — Ambulatory Visit: Payer: PPO | Admitting: Cardiology

## 2021-08-02 ENCOUNTER — Encounter: Payer: Self-pay | Admitting: Cardiology

## 2021-08-02 DIAGNOSIS — R600 Localized edema: Secondary | ICD-10-CM

## 2021-08-02 DIAGNOSIS — I10 Essential (primary) hypertension: Secondary | ICD-10-CM | POA: Diagnosis not present

## 2021-08-02 NOTE — Progress Notes (Signed)
?Cardiology Office Note:   ? ?Date:  08/02/2021  ? ?ID:  [name] [name], DOB Feb 02, 1928, MRN [id number] ? ?PCP:  Vivi Barrack, MD ?  ?[facility] Providers ?Cardiologist:  Candee Furbish, MD    ? ?Referring MD: Vivi Barrack, MD  ? ? ?History of Present Illness:   ? ?[name] [name] is a 88 y.o. female here for follow-up of lower extremity edema previously seen at the request of Dr. [name] ? ?She had previously had Unna boots in place.  Drainage.  Props her legs up with pillows.  Ace wraps. ? ?Had been on verapamil as well as Lasix.  Ejection fraction on echocardiogram in 2020 was 55%.  At prior visit we increased her Lasix from 20 up to 40.  We also discontinued her verapamil 120 at that time thinking that this may be contributing to her lower extremity edema. ? ?Past Medical History:  ?Diagnosis Date  ? ALLERGIC RHINITIS 11/02/2006  ? CKD (chronic kidney disease), stage III ([place])   ? HYPERLIPIDEMIA 11/04/2006  ? HYPOTHYROIDISM, POSTSURGICAL 03/28/2007  ? Malignant neoplasm of thyroid gland ([place]) 03/28/2007  ? 5/07: righte lobectomy--path shows 3 cm follicular ca 3/71: completion left lobectomy, no more tumor seen 09/07: thyroglobulin 0.2 9ab neg) 11/07: i-131 rx, 101 mci 6/08: thyroglogulin 0.2 (ab neg) , hypothyroid body scan neg (clinically tolerated hypothyroidism poorly) 06/09: tg undetectable (ab=1.5, normal) 12/10: tg undetectable (ab neg) 6/11: tg undectable (ab neg)  ? OSTEOPENIA 11/02/2006  ? ? ?Past Surgical History:  ?Procedure Laterality Date  ? ABDOMINAL HYSTERECTOMY    ? CERVICAL POLYPECTOMY    ? OPEN REDUCTION INTERNAL FIXATION (ORIF) DISTAL RADIAL FRACTURE Left 01/14/2014  ? Procedure: OPEN REDUCTION INTERNAL FIXATION DISTAL RADIAL FRACTURE;  Surgeon: Linna Hoff, MD;  Location: [facility];  Service: Orthopedics;  Laterality: Left;  ? ? ?Current Medications: ?Current Meds  ?Medication Sig  ? calcitRIOL (ROCALTROL) 0.5 MCG capsule Take 1 capsule (0.5 mcg total) by mouth daily.  ? docusate sodium  (COLACE) 100 MG capsule Take 100 mg by mouth at bedtime.   ? furosemide (LASIX) 40 MG tablet Take 1 tablet (40 mg total) by mouth 2 (two) times daily.  ? levothyroxine (SYNTHROID) 112 MCG tablet Take 1 tablet by mouth once daily  ? triamcinolone ointment (KENALOG) 0.5 % Apply 1 application topically 2 (two) times daily.  ? valACYclovir (VALTREX) 1000 MG tablet Take 1 tablet (1,000 mg total) by mouth daily. Take 1 pill daily for 5 days for outbreaks.  ? vitamin C (ASCORBIC ACID) 500 MG tablet Take 1 tablet (500 mg total) by mouth daily.  ?  ? ?Allergies:   Verapamil  ? ?Social History  ? ?Socioeconomic History  ? Marital status: Widowed  ?  Spouse name: Not on file  ? Number of children: Not on file  ? Years of education: Not on file  ? Highest education level: Not on file  ?Occupational History  ? Not on file  ?Tobacco Use  ? Smoking status: Never  ? Smokeless tobacco: Never  ?Substance and Sexual Activity  ? Alcohol use: Not Currently  ? Drug use: Never  ? Sexual activity: Not on file  ?Other Topics Concern  ? Not on file  ?Social History Narrative  ? Doesn't drive  ? ?Social Determinants of Health  ? ?Financial Resource Strain: Not on file  ?Food Insecurity: Not on file  ?Transportation Needs: Not on file  ?Physical Activity: Not on file  ?Stress: Not on file  ?  Social Connections: Not on file  ?  ? ?Family History: ?The patient's family history includes Diabetes in her sister. ? ?ROS:   ?Please see the history of present illness.    ? All other systems reviewed and are negative. ? ?EKGs/Labs/Other Studies Reviewed:   ? ?The following studies were reviewed today: ?Echo as above ? ?EKG:  EKG is  ordered today.  The ekg ordered today demonstrates normal sinus rhythm 72 left bundle branch block ? ?Recent Labs: ?12/10/2020: Hemoglobin 11.3; Platelets 291.0 ?12/17/2020: ALT 31 ?02/08/2021: BUN 21; Creatinine, Ser 1.04; Potassium 3.9; Sodium 138 ?07/26/2021: TSH 0.51  ?Recent Lipid Panel ?   ?Component Value Date/Time  ? CHOL  208 (HH) 11/06/2007 1102  ? TRIG 72 11/06/2007 1102  ? HDL 64.8 11/06/2007 1102  ? CHOLHDL 3.2 CALC 11/06/2007 1102  ? VLDL 14 11/06/2007 1102  ? LDLDIRECT 124.5 11/06/2007 1102  ? ? ? ?Risk Assessment/Calculations:   ? ? ?    ? ?   ? ?Physical Exam:   ? ?VS:  BP 120/70 (BP Location: Left Arm, Patient Position: Sitting, Cuff Size: Normal)   Pulse 72   Ht '5\' 3"'$  (1.6 m)   Wt 129 lb (58.5 kg)   SpO2 97%   BMI 22.85 kg/m?    ? ?Wt Readings from Last 3 Encounters:  ?08/02/21 129 lb (58.5 kg)  ?07/26/21 129 lb 9.6 oz (58.8 kg)  ?06/06/21 127 lb (57.6 kg)  ?  ? ?GEN: Elderly Well nourished, well developed in no acute distress ?HEENT: Normal left ear works better than right.  Hard of hearing. ?NECK: No JVD; No carotid bruits ?LYMPHATICS: No lymphadenopathy ?CARDIAC: RRR, no murmurs, no rubs, gallops ?RESPIRATORY:  Clear to auscultation without rales, wheezing or rhonchi  ?ABDOMEN: Soft, non-tender, non-distended ?MUSCULOSKELETAL:  No edema; No deformity  ?SKIN: Warm and dry ?NEUROLOGIC:  Alert and oriented x 3 ?PSYCHIATRIC:  Normal affect  ? ?ASSESSMENT:   ? ?1. Essential hypertension   ?2. Leg edema   ? ?PLAN:   ? ?In order of problems listed above: ? ?Essential hypertension ?Previously stopped verapamil which can sometimes exacerbate lower extremity edema.  Overall doing well without this medication.  Well-controlled.  She is not having any issues with racing heart. ? ?Leg edema ?Conservative measures in addition to Lasix 40 mg.  Continue to monitor her kidney function.  No changes made.  Her legs look great today. ?  ? ? ?  ? ? ?Medication Adjustments/Labs and Tests Ordered: ?Current medicines are reviewed at length with the patient today.  Concerns regarding medicines are outlined above.  ?Orders Placed This Encounter  ?Procedures  ? EKG 12-Lead  ? ?No orders of the defined types were placed in this encounter. ? ? ?Patient Instructions  ?Medication Instructions:  ?The current medical regimen is effective;  continue  present plan and medications. ? ?*If you need a refill on your cardiac medications before your next appointment, please call your pharmacy* ? ?Follow-Up: ?At [facility], you and your health needs are our priority.  As part of our continuing mission to provide you with exceptional heart care, we have created designated Provider Care Teams.  These Care Teams include your primary Cardiologist (physician) and Advanced Practice Providers (APPs -  Physician Assistants and Nurse Practitioners) who all work together to provide you with the care you need, when you need it. ? ?We recommend signing up for the patient portal called "MyChart".  Sign up information is provided on this After Visit Summary.  MyChart is used to connect with patients for Virtual Visits (Telemedicine).  Patients are able to view lab/test results, encounter notes, upcoming appointments, etc.  Non-urgent messages can be sent to your provider as well.   ?To learn more about what you can do with MyChart, go to [email or web address].   ? ?Your next appointment:   ?1 year(s) ? ?The format for your next appointment:   ?In Person ? ?Provider:   ?Candee Furbish, MD { ? ? ? ?Important Information About Sugar ? ? ? ? ?   ? ?Signed, ?Candee Furbish, MD  ?08/02/2021 10:26 AM    ?[place] ?

## 2021-08-02 NOTE — Assessment & Plan Note (Addendum)
Previously stopped verapamil which can sometimes exacerbate lower extremity edema.  Overall doing well without this medication.  Well-controlled.  She is not having any issues with racing heart. ?

## 2021-08-02 NOTE — Assessment & Plan Note (Addendum)
Conservative measures in addition to Lasix 40 mg.  Continue to monitor her kidney function.  No changes made.  Her legs look great today. ?

## 2021-08-02 NOTE — Patient Instructions (Signed)

## 2021-08-16 ENCOUNTER — Ambulatory Visit: Payer: PPO | Admitting: Podiatry

## 2021-08-16 ENCOUNTER — Encounter: Payer: Self-pay | Admitting: Podiatry

## 2021-08-16 DIAGNOSIS — M216X2 Other acquired deformities of left foot: Secondary | ICD-10-CM

## 2021-08-16 DIAGNOSIS — B351 Tinea unguium: Secondary | ICD-10-CM | POA: Diagnosis not present

## 2021-08-16 DIAGNOSIS — L84 Corns and callosities: Secondary | ICD-10-CM | POA: Diagnosis not present

## 2021-08-16 DIAGNOSIS — M216X1 Other acquired deformities of right foot: Secondary | ICD-10-CM

## 2021-08-16 DIAGNOSIS — N183 Chronic kidney disease, stage 3 unspecified: Secondary | ICD-10-CM

## 2021-08-16 DIAGNOSIS — M79674 Pain in right toe(s): Secondary | ICD-10-CM | POA: Diagnosis not present

## 2021-08-16 DIAGNOSIS — M79675 Pain in left toe(s): Secondary | ICD-10-CM | POA: Diagnosis not present

## 2021-08-16 NOTE — Progress Notes (Signed)
This patient returns to my office for at risk foot care.  This patient requires this care by a professional since this patient will be at risk due to having CKD.   This patient is unable to cut nails herself since the patient cannot reach her nails.These nails are painful walking and wearing shoes.  Patient presents to the office with her daughter. This patient presents for at risk foot care today. ? ?General Appearance  Alert, conversant and in no acute stress. ? ?Vascular  Dorsalis pedis and posterior tibial  pulses are weakly  palpable  bilaterally.  Capillary return is within normal limits  bilaterally. Cold feet.    Bilaterally. Absent digital toenails. ? ?Neurologic  Senn-Weinstein monofilament wire test within normal limits  bilaterally. Muscle power within normal limits bilaterally. ? ?Nails Thick disfigured discolored nails with subungual debris  from hallux to fifth toes bilaterally. No evidence of bacterial infection or drainage bilaterally. ? ?Orthopedic  No limitations of motion  feet .  No crepitus or effusions noted.  No bony pathology or digital deformities noted.  Plantar flexed fifth metatarsal  B/L. ? ?Skin  normotropic skin  noted bilaterally.  No signs of infections or ulcers noted.   Porokeratosis sub 5th  B/L. ? ?Onychomycosis  Pain in right toes  Pain in left toes  Porokeratosis  B/L. ? ?Consent was obtained for treatment procedures.   Mechanical debridement of nails 1-5  bilaterally performed with a nail nipper.  Filed with dremel without incident. Debride porokeratosis with # 15 blade. ? ? ?Return office visit    3 months                  Told patient to return for periodic foot care and evaluation due to potential at risk complications. ? ? ?Gardiner Barefoot DPM  ?

## 2021-09-06 ENCOUNTER — Ambulatory Visit (INDEPENDENT_AMBULATORY_CARE_PROVIDER_SITE_OTHER): Payer: PPO | Admitting: Family Medicine

## 2021-09-06 ENCOUNTER — Other Ambulatory Visit: Payer: Self-pay

## 2021-09-06 ENCOUNTER — Ambulatory Visit (INDEPENDENT_AMBULATORY_CARE_PROVIDER_SITE_OTHER)
Admission: RE | Admit: 2021-09-06 | Discharge: 2021-09-06 | Disposition: A | Discharge status: Home / Self Care | Payer: PPO | Source: Ambulatory Visit | Attending: Family Medicine | Admitting: Family Medicine

## 2021-09-06 VITALS — BP 112/69 | HR 69 | Temp 97.3°F | Ht 63.0 in | Wt 130.6 lb

## 2021-09-06 DIAGNOSIS — J329 Chronic sinusitis, unspecified: Secondary | ICD-10-CM | POA: Diagnosis not present

## 2021-09-06 DIAGNOSIS — I10 Essential (primary) hypertension: Secondary | ICD-10-CM

## 2021-09-06 DIAGNOSIS — N183 Chronic kidney disease, stage 3 unspecified: Secondary | ICD-10-CM

## 2021-09-06 DIAGNOSIS — R059 Cough, unspecified: Secondary | ICD-10-CM | POA: Diagnosis not present

## 2021-09-06 DIAGNOSIS — E890 Postprocedural hypothyroidism: Secondary | ICD-10-CM

## 2021-09-06 DIAGNOSIS — E208 Other hypoparathyroidism: Secondary | ICD-10-CM | POA: Diagnosis not present

## 2021-09-06 LAB — CBC WITH DIFFERENTIAL/PLATELET
Basophils Absolute: 0 10*3/uL (ref 0.0–0.1)
Basophils Relative: 0.1 % (ref 0.0–3.0)
Eosinophils Absolute: 0 10*3/uL (ref 0.0–0.7)
Eosinophils Relative: 0 % (ref 0.0–5.0)
HCT: 32.7 % — ABNORMAL LOW (ref 36.0–46.0)
Hemoglobin: 10.9 g/dL — ABNORMAL LOW (ref 12.0–15.0)
Lymphocytes Relative: 6.3 % — ABNORMAL LOW (ref 12.0–46.0)
Lymphs Abs: 0.7 10*3/uL (ref 0.7–4.0)
MCHC: 33.5 g/dL (ref 30.0–36.0)
MCV: 86.6 fl (ref 78.0–100.0)
Monocytes Absolute: 0.8 10*3/uL (ref 0.1–1.0)
Monocytes Relative: 6.6 % (ref 3.0–12.0)
Neutro Abs: 10.2 10*3/uL — ABNORMAL HIGH (ref 1.4–7.7)
Neutrophils Relative %: 87 % — ABNORMAL HIGH (ref 43.0–77.0)
Platelets: 228 10*3/uL (ref 150.0–400.0)
RBC: 3.77 Mil/uL — ABNORMAL LOW (ref 3.87–5.11)
RDW: 13.5 % (ref 11.5–15.5)
WBC: 11.7 10*3/uL — ABNORMAL HIGH (ref 4.0–10.5)

## 2021-09-06 LAB — COMPREHENSIVE METABOLIC PANEL
ALT: 18 U/L (ref 0–35)
AST: 35 U/L (ref 0–37)
Albumin: 3.7 g/dL (ref 3.5–5.2)
Alkaline Phosphatase: 40 U/L (ref 39–117)
BUN: 63 mg/dL — ABNORMAL HIGH (ref 6–23)
CO2: 27 mEq/L (ref 19–32)
Calcium: 7.5 mg/dL — ABNORMAL LOW (ref 8.4–10.5)
Chloride: 89 mEq/L — ABNORMAL LOW (ref 96–112)
Creatinine, Ser: 2.44 mg/dL — ABNORMAL HIGH (ref 0.40–1.20)
GFR: 16.86 mL/min — ABNORMAL LOW (ref >60.00)
Glucose, Bld: 128 mg/dL — ABNORMAL HIGH (ref 70–99)
Potassium: 3.7 mEq/L (ref 3.5–5.1)
Sodium: 131 mEq/L — ABNORMAL LOW (ref 135–145)
Total Bilirubin: 0.6 mg/dL (ref 0.2–1.2)
Total Protein: 7.3 g/dL (ref 6.0–8.3)

## 2021-09-06 LAB — TSH: TSH: 0.86 u[IU]/mL (ref 0.35–5.50)

## 2021-09-06 MED ORDER — AZITHROMYCIN 250 MG PO TABS: ORAL_TABLET | ORAL | 0 refills | Status: DC

## 2021-09-06 MED ORDER — BENZONATATE 200 MG PO CAPS: 200.0000 mg | ORAL_CAPSULE | Freq: Two times a day (BID) | ORAL | 0 refills | Status: DC | PRN

## 2021-09-06 MED ORDER — LEVOTHYROXINE SODIUM 112 MCG PO TABS: 112.0000 ug | ORAL_TABLET | Freq: Every day | ORAL | 0 refills | Status: DC

## 2021-09-06 NOTE — Assessment & Plan Note (Signed)
Endocrinologist is retiring.  She would like for [place] to take over prescription for calcitriol.  Does not need refill today.  We will recheck ionized calcium and PTH today.  She is on calcitriol 0.5 mcg daily.

## 2021-09-06 NOTE — Assessment & Plan Note (Signed)
Check TSH.  Continue Synthroid 112 mcg daily.  Her endocrinologist is retiring and she would like for [place] to take over this prescription.

## 2021-09-06 NOTE — Patient Instructions (Signed)
It was very nice to see you today!  Please start the antibiotic. We will check an xray and blood work today. Please let us know if your symptoms are not improving in the next couple of days.   Take care, Dr Jerline Pain  PLEASE NOTE:  If you had any lab tests please let us know if you have not heard back within a few days. You may see your results on mychart before we have a chance to review them but we will give you a call once they are reviewed by [place]. If we ordered any referrals today, please let us know if you have not heard from their office within the next week.   Please try these tips to maintain a healthy lifestyle:  Eat at least 3 REAL meals and 1-2 snacks per day.  Aim for no more than 5 hours between eating.  If you eat breakfast, please do so within one hour of getting up.   Each meal should contain half fruits/vegetables, one quarter protein, and one quarter carbs (no bigger than a computer mouse)  Cut down on sweet beverages. This includes juice, soda, and sweet tea.   Drink at least 1 glass of water with each meal and aim for at least 8 glasses per day  Exercise at least 150 minutes every week.

## 2021-09-06 NOTE — Assessment & Plan Note (Signed)
At goal off medications. 

## 2021-09-06 NOTE — Assessment & Plan Note (Signed)
Check CMET. 

## 2021-09-06 NOTE — Progress Notes (Signed)
   [name] [name] is a 88 y.o. female who presents today for an office visit.  Assessment/Plan:  New/Acute Problems: Cough She does have some rhonchi and crackles on exam today.  Given her overall frail state we will empirically start treatment with azithromycin to cover for any underlying pneumonia or bronchitis.  We will check labs and x-ray today as well.  We will send in Tessalon as needed for cough.  Encouraged good hydration.  We discussed reasons to return to care and seek emergent care.  They will let me know if not improving over the next couple of days.  Doubt this is a cardiac issue given her productive yellow sputum on exam however if not proving with antibiotics and her x-ray is clear will need to have her follow-up with cardiology versus update echocardiogram.  Chronic Problems Addressed Today: HYPOPARATHYROIDISM Endocrinologist is retiring.  She would like for [place] to take over prescription for calcitriol.  Does not need refill today.  We will recheck ionized calcium and PTH today.  She is on calcitriol 0.5 mcg daily.  HYPOTHYROIDISM, POSTSURGICAL Check TSH.  Continue Synthroid 112 mcg daily.  Her endocrinologist is retiring and she would like for [place] to take over this prescription.  CKD (chronic kidney disease), stage III ([facility]) Check CMET.   Essential hypertension At goal off medications.      Subjective:  HPI:  Patient here with cough for the last few days. Associated with malaise and fatigue. Cough is productive of yellow sputum. Some subjective fevers and chills.  No known sick contacts.  No specific treatments tried.  No significant shortness of breath but she does feel more winded when getting up and walking around.  No reported chest pain.  No leg swelling.  See A/p for status of chronic conditions.        Objective:  Physical Exam: BP 112/69   Pulse 69   Temp (!) 97.3 F (36.3 C) (Temporal)   Ht '5\' 3"'$  (1.6 m)   Wt 130 lb 9.6 oz (59.2 kg)   SpO2 99%   BMI  23.13 kg/m   Wt Readings from Last 3 Encounters:  09/06/21 130 lb 9.6 oz (59.2 kg)  08/02/21 129 lb (58.5 kg)  07/26/21 129 lb 9.6 oz (58.8 kg)  Gen: No acute distress, resting comfortably CV: Regular rate and rhythm with 2/6 systolic murmur appreciated. Radial pulses and DP pulses 2+ and symmetric bilaterally.  Pulm: Speaking in full sentences. Normal work of breathing, Bibasilar ronchi and crackles noted.  MSK: Trace pretibial edema bilaterally.  Neuro: Grossly normal, moves all extremities Psych: Normal affect and thought content      Reeda Soohoo M. Jerline Pain, MD 09/06/2021 9:16 AM

## 2021-09-07 ENCOUNTER — Other Ambulatory Visit: Payer: Self-pay | Admitting: *Deleted

## 2021-09-07 DIAGNOSIS — D72829 Elevated white blood cell count, unspecified: Secondary | ICD-10-CM

## 2021-09-07 LAB — PTH, INTACT AND CALCIUM
Calcium: 7.3 mg/dL — ABNORMAL LOW (ref 8.6–10.4)
PTH: 22 pg/mL (ref 16–77)

## 2021-09-07 NOTE — Progress Notes (Signed)
Please inform patient of the following:  Her kidney numbers are very elevated and it looks like she is probably dehydrated.  Can we please make sure that she is getting plenty of fluids and aiming for 8 glasses of water per day.  Her white blood cell counts are also elevated but hopefully this should improve as we are treating her infection.  Please have her come back later this week or to recheck her labs.  Please place future order for c-Met and CBC.

## 2021-09-13 ENCOUNTER — Encounter: Payer: Self-pay | Admitting: Family Medicine

## 2021-09-13 ENCOUNTER — Ambulatory Visit (INDEPENDENT_AMBULATORY_CARE_PROVIDER_SITE_OTHER): Payer: PPO | Admitting: Family Medicine

## 2021-09-13 VITALS — BP 117/58 | HR 73 | Temp 97.5°F | Ht 63.0 in | Wt 126.4 lb

## 2021-09-13 DIAGNOSIS — N390 Urinary tract infection, site not specified: Secondary | ICD-10-CM

## 2021-09-13 DIAGNOSIS — N183 Chronic kidney disease, stage 3 unspecified: Secondary | ICD-10-CM | POA: Diagnosis not present

## 2021-09-13 DIAGNOSIS — I10 Essential (primary) hypertension: Secondary | ICD-10-CM | POA: Diagnosis not present

## 2021-09-13 DIAGNOSIS — R200 Anesthesia of skin: Secondary | ICD-10-CM | POA: Diagnosis not present

## 2021-09-13 LAB — POCT URINALYSIS DIPSTICK
Bilirubin, UA: NEGATIVE
Blood, UA: NEGATIVE
Glucose, UA: NEGATIVE
Ketones, UA: NEGATIVE
Nitrite, UA: NEGATIVE
Protein, UA: NEGATIVE
Spec Grav, UA: 1.01 (ref 1.010–1.025)
Urobilinogen, UA: 0.2 E.U./dL
pH, UA: 6 (ref 5.0–8.0)

## 2021-09-13 MED ORDER — GABAPENTIN 100 MG PO CAPS: 100.0000 mg | ORAL_CAPSULE | Freq: Every day | ORAL | 3 refills | Status: DC

## 2021-09-13 MED ORDER — NITROFURANTOIN MONOHYD MACRO 100 MG PO CAPS: 100.0000 mg | ORAL_CAPSULE | Freq: Two times a day (BID) | ORAL | 0 refills | Status: DC

## 2021-09-13 NOTE — Progress Notes (Signed)
   [name] [name] is a 88 y.o. female who presents today for an office visit.  Assessment/Plan:  New/Acute Problems: UTI UA with positive leukocyte esterase.  History consistent with previous UTIs.  Will empirically start Macrobid 100 mg twice daily for 7 days while he await culture results.  Encouraged hydration.  We discussed reasons to return to care.  Follow-up as needed.  Chronic Problems Addressed Today: Numbness Still has ongoing lower extremity paresthesias.  Likely has some component of lumbar radiculopathy.  We will try Tylenol although daughter request prescription medication as well.  We will try low-dose gabapentin 100 mg daily.  We discussed potential side effects.  We will follow-up in a few weeks via MyChart.  Essential hypertension At goal off meds.  CKD (chronic kidney disease), stage III ([place]) Recently had labs that showed significant increase in creatinine and decreasing GFR.  Possibly due to dehydration.  They will be coming back next week to recheck creatinine.  We discussed importance of good hydration.  If GFR continues to be low will need referral to nephrology.     Subjective:  HPI:  Patient here with dysuria for the last several days. Concerned for possible UTI.  No fevers or chills.  No nausea or vomiting.       Objective:  Physical Exam: BP (!) 117/58   Pulse 73   Temp (!) 97.5 F (36.4 C) (Temporal)   Ht '5\' 3"'$  (1.6 m)   Wt 126 lb 6.4 oz (57.3 kg)   SpO2 99%   BMI 22.39 kg/m   Gen: No acute distress, resting comfortably CV: Regular rate and rhythm with no murmurs appreciated Pulm: Normal work of breathing, clear to auscultation bilaterally with no crackles, wheezes, or rhonchi Neuro: Grossly normal, moves all extremities Psych: Normal affect and thought content      Akeela Busk M. Jerline Pain, MD 09/13/2021 12:20 PM

## 2021-09-13 NOTE — Patient Instructions (Signed)
It was nice to see you!  You have a urinary tract infection. Please start the antibiotic.  We will check a urine culture to make sure you do not have a resistant bacteria. We will call you if we need to change your medications.   Please make sure you are drinking plenty of fluids over the next few days.  If your symptoms do not improve over the next 5-7 days, or if they worsen, please let us know. Please also let us know if you have worsening back pain, fevers, chills, or body aches.   Take care, Dr Jerline Pain

## 2021-09-13 NOTE — Assessment & Plan Note (Signed)
At goal off meds.

## 2021-09-13 NOTE — Assessment & Plan Note (Signed)
Recently had labs that showed significant increase in creatinine and decreasing GFR.  Possibly due to dehydration.  They will be coming back next week to recheck creatinine.  We discussed importance of good hydration.  If GFR continues to be low will need referral to nephrology.

## 2021-09-13 NOTE — Assessment & Plan Note (Signed)
Still has ongoing lower extremity paresthesias.  Likely has some component of lumbar radiculopathy.  We will try Tylenol although daughter request prescription medication as well.  We will try low-dose gabapentin 100 mg daily.  We discussed potential side effects.  We will follow-up in a few weeks via MyChart.

## 2021-09-16 LAB — URINE CULTURE
MICRO NUMBER:: 13489246
SPECIMEN QUALITY:: ADEQUATE

## 2021-09-19 ENCOUNTER — Other Ambulatory Visit (INDEPENDENT_AMBULATORY_CARE_PROVIDER_SITE_OTHER): Payer: PPO

## 2021-09-19 DIAGNOSIS — D72829 Elevated white blood cell count, unspecified: Secondary | ICD-10-CM

## 2021-09-19 LAB — COMPREHENSIVE METABOLIC PANEL
ALT: 11 U/L (ref 0–35)
AST: 18 U/L (ref 0–37)
Albumin: 3.8 g/dL (ref 3.5–5.2)
Alkaline Phosphatase: 47 U/L (ref 39–117)
BUN: 36 mg/dL — ABNORMAL HIGH (ref 6–23)
CO2: 35 mEq/L — ABNORMAL HIGH (ref 19–32)
Calcium: 9.2 mg/dL (ref 8.4–10.5)
Chloride: 95 mEq/L — ABNORMAL LOW (ref 96–112)
Creatinine, Ser: 1.44 mg/dL — ABNORMAL HIGH (ref 0.40–1.20)
GFR: 31.75 mL/min — ABNORMAL LOW (ref >60.00)
Glucose, Bld: 116 mg/dL — ABNORMAL HIGH (ref 70–99)
Potassium: 5.2 mEq/L — ABNORMAL HIGH (ref 3.5–5.1)
Sodium: 141 mEq/L (ref 135–145)
Total Bilirubin: 0.5 mg/dL (ref 0.2–1.2)
Total Protein: 8 g/dL (ref 6.0–8.3)

## 2021-09-19 LAB — CBC
HCT: 34 % — ABNORMAL LOW (ref 36.0–46.0)
Hemoglobin: 11.4 g/dL — ABNORMAL LOW (ref 12.0–15.0)
MCHC: 33.6 g/dL (ref 30.0–36.0)
MCV: 87.4 fl (ref 78.0–100.0)
Platelets: 413 10*3/uL — ABNORMAL HIGH (ref 150.0–400.0)
RBC: 3.89 Mil/uL (ref 3.87–5.11)
RDW: 13.1 % (ref 11.5–15.5)
WBC: 9.1 10*3/uL (ref 4.0–10.5)

## 2021-09-19 NOTE — Progress Notes (Signed)
Please inform patient of the following:  Her urine culture confirms UTI. The antibiotic we sent in should treat this. She should let us know if her symptoms are not improving.  Algis Greenhouse. Jerline Pain, MD 09/19/2021 5:19 PM

## 2021-09-19 NOTE — Progress Notes (Signed)
Please inform patient of the following:  HEr numbers all look much better than last time though they are still not back to her baseline. She should continue getting plenty of fluids and staying hydrated and we can recheck again in 1-2 weeks. Please place future order for BMET.

## 2021-09-21 ENCOUNTER — Other Ambulatory Visit: Payer: Self-pay | Admitting: *Deleted

## 2021-09-21 ENCOUNTER — Other Ambulatory Visit: Payer: Self-pay | Admitting: Family Medicine

## 2021-09-21 DIAGNOSIS — E875 Hyperkalemia: Secondary | ICD-10-CM

## 2021-10-03 ENCOUNTER — Other Ambulatory Visit: Payer: PPO

## 2021-10-04 ENCOUNTER — Other Ambulatory Visit (INDEPENDENT_AMBULATORY_CARE_PROVIDER_SITE_OTHER): Payer: PPO

## 2021-10-04 DIAGNOSIS — E875 Hyperkalemia: Secondary | ICD-10-CM

## 2021-10-04 LAB — BASIC METABOLIC PANEL
BUN: 24 mg/dL — ABNORMAL HIGH (ref 6–23)
CO2: 35 mEq/L — ABNORMAL HIGH (ref 19–32)
Calcium: 8.5 mg/dL (ref 8.4–10.5)
Chloride: 96 mEq/L (ref 96–112)
Creatinine, Ser: 1.57 mg/dL — ABNORMAL HIGH (ref 0.40–1.20)
GFR: 28.61 mL/min — ABNORMAL LOW (ref >60.00)
Glucose, Bld: 118 mg/dL — ABNORMAL HIGH (ref 70–99)
Potassium: 3.8 mEq/L (ref 3.5–5.1)
Sodium: 138 mEq/L (ref 135–145)

## 2021-10-06 ENCOUNTER — Ambulatory Visit (INDEPENDENT_AMBULATORY_CARE_PROVIDER_SITE_OTHER): Payer: PPO | Admitting: Physician Assistant

## 2021-10-06 ENCOUNTER — Encounter: Payer: Self-pay | Admitting: Physician Assistant

## 2021-10-06 VITALS — BP 126/69 | HR 74 | Temp 97.2°F | Ht 63.0 in | Wt 126.6 lb

## 2021-10-06 DIAGNOSIS — J309 Allergic rhinitis, unspecified: Secondary | ICD-10-CM | POA: Diagnosis not present

## 2021-10-06 DIAGNOSIS — R053 Chronic cough: Secondary | ICD-10-CM | POA: Diagnosis not present

## 2021-10-06 MED ORDER — LEVOCETIRIZINE DIHYDROCHLORIDE 5 MG PO TABS
5.0000 mg | ORAL_TABLET | Freq: Every evening | ORAL | 2 refills | Status: DC
Start: 2021-10-06 — End: 2021-10-17

## 2021-10-06 MED ORDER — AZELASTINE HCL 0.1 % NA SOLN: 2.0000 | Freq: Two times a day (BID) | NASAL | 12 refills | Status: DC

## 2021-10-06 MED ORDER — BENZONATATE 200 MG PO CAPS: ORAL_CAPSULE | ORAL | 0 refills | Status: DC

## 2021-10-06 NOTE — Progress Notes (Signed)
Please inform patient of the following:  Her kidney function is stable but still not yet back to her baseline. Can we decrease her dose of lasix to once daily and have them come back to recheck in 1-2 weeks?

## 2021-10-06 NOTE — Progress Notes (Signed)
Subjective:    Patient ID: [name] [name], female    DOB: December 19, 1927, 88 y.o.   MRN: [id number]  Chief Complaint  Patient presents with   Cough   Sinus Problem    HPI Patient is in today for cough and sinus congestion. Here with granddaughter. "Ongoing for years." Coughs for about 2-3 hours in the mornings, clear productive phlegm. Drains from sinuses as well. Sleeps in a lift chair. Avoids sleeping often because it makes symptoms worse. Tried Cetirizine & nasal sprays in the past w/o relief.  No smoking history. Currently exposed to secondhand smoke. She had a CXR 09/06/21 which was negative for cardiopulmonary disease.  Past Medical History:  Diagnosis Date   ALLERGIC RHINITIS 11/02/2006   CKD (chronic kidney disease), stage III ([place])    HYPERLIPIDEMIA 11/04/2006   HYPOTHYROIDISM, POSTSURGICAL 03/28/2007   Malignant neoplasm of thyroid gland ([place]) 03/28/2007   5/07: righte lobectomy--path shows 3 cm follicular ca 8/10: completion left lobectomy, no more tumor seen 09/07: thyroglobulin 0.2 9ab neg) 11/07: i-131 rx, [facility] 6/08: thyroglogulin 0.2 (ab neg) , hypothyroid body scan neg (clinically tolerated hypothyroidism poorly) 06/09: tg undetectable (ab=1.5, normal) 12/10: tg undetectable (ab neg) 6/11: tg undectable (ab neg)   OSTEOPENIA 11/02/2006    Past Surgical History:  Procedure Laterality Date   ABDOMINAL HYSTERECTOMY     CERVICAL POLYPECTOMY     OPEN REDUCTION INTERNAL FIXATION (ORIF) DISTAL RADIAL FRACTURE Left 01/14/2014   Procedure: OPEN REDUCTION INTERNAL FIXATION DISTAL RADIAL FRACTURE;  Surgeon: Linna Hoff, MD;  Location: [facility];  Service: Orthopedics;  Laterality: Left;    Family History  Problem Relation Age of Onset   Diabetes Sister     Social History   Tobacco Use   Smoking status: Never   Smokeless tobacco: Never  Substance Use Topics   Alcohol use: Not Currently   Drug use: Never     Allergies  Allergen Reactions   Verapamil Other (See Comments)     edema     Review of Systems NEGATIVE UNLESS OTHERWISE INDICATED IN HPI      Objective:     BP 126/69   Pulse 74   Temp (!) 97.2 F (36.2 C) (Temporal)   Ht '5\' 3"'$  (1.6 m)   Wt 126 lb 9.6 oz (57.4 kg)   SpO2 95%   BMI 22.43 kg/m   Wt Readings from Last 3 Encounters:  10/06/21 126 lb 9.6 oz (57.4 kg)  09/13/21 126 lb 6.4 oz (57.3 kg)  09/06/21 130 lb 9.6 oz (59.2 kg)    BP Readings from Last 3 Encounters:  10/06/21 126/69  09/13/21 (!) 117/58  09/06/21 112/69     Physical Exam Vitals and nursing note reviewed.  Constitutional:      General: She is not in acute distress.    Appearance: Normal appearance. She is not ill-appearing.     Comments: Frail, walking with cane   HENT:     Head: Normocephalic.     Nose: No congestion.     Mouth/Throat:     Mouth: Mucous membranes are moist.     Pharynx: No oropharyngeal exudate or posterior oropharyngeal erythema.  Eyes:     Extraocular Movements: Extraocular movements intact.     Conjunctiva/sclera: Conjunctivae normal.     Pupils: Pupils are equal, round, and reactive to light.  Cardiovascular:     Rate and Rhythm: Normal rate. Rhythm irregular.     Heart sounds: Murmur heard.  Pulmonary:  Effort: Pulmonary effort is normal. No respiratory distress.     Breath sounds: Normal breath sounds. No wheezing, rhonchi or rales.  Musculoskeletal:     Cervical back: Normal range of motion.  Skin:    General: Skin is warm.  Neurological:     Mental Status: She is alert and oriented to person, place, and time.  Psychiatric:        Mood and Affect: Mood normal.        Behavior: Behavior normal.        Assessment & Plan:   Problem List Items Addressed This Visit       Respiratory   Allergic rhinitis - Primary   Other Visit Diagnoses     Chronic cough            Meds ordered this encounter  Medications   levocetirizine (XYZAL) 5 MG tablet    Sig: Take 1 tablet (5 mg total) by mouth every evening.     Dispense:  30 tablet    Refill:  2    Order Specific Question:   Supervising Provider    Answer:   Marin Olp [[id number]]   azelastine (ASTELIN) 0.1 % nasal spray    Sig: Place 2 sprays into both nostrils 2 (two) times daily. Use in each nostril as directed    Dispense:  30 mL    Refill:  12    Order Specific Question:   Supervising Provider    Answer:   Marin Olp [[id number]]   benzonatate (TESSALON) 200 MG capsule    Sig: TAKE 1 CAPSULE BY MOUTH TWICE DAILY AS NEEDED FOR COUGH    Dispense:  30 capsule    Refill:  0    Order Specific Question:   Supervising Provider    Answer:   Garret Reddish O [[id number]]   1. Allergic rhinitis, unspecified seasonality, unspecified trigger 2. Chronic cough  I think she most likely has a chronic sinus issue causing postnasal drip, which in turn causes chronic coughing spells. Let's start on daily nasal spray and XYZAL. Push fluids. Humidifier. Try to avoid smoking exposure. Consider pulmonology or ENT / allergist referral   Return in about 2 weeks (around 10/20/2021) for recheck sinus / chronic cough with Dr. Jerline Pain .  This note was prepared with assistance of Systems analyst. Occasional wrong-word or sound-a-like substitutions may have occurred due to the inherent limitations of voice recognition software.   Joleen Stuckert M Nikolaus Pienta, PA-C

## 2021-10-06 NOTE — Patient Instructions (Addendum)
Good to meet you today.  I think she most likely has a chronic sinus issue causing postnasal drip, which in turn causes chronic coughing spells. Let's start on daily nasal spray and XYZAL. Push fluids. Humidifier. Try to avoid smoking exposure. Consider pulmonology or ENT / allergist referral.

## 2021-10-10 ENCOUNTER — Other Ambulatory Visit: Payer: Self-pay | Admitting: *Deleted

## 2021-10-10 DIAGNOSIS — E875 Hyperkalemia: Secondary | ICD-10-CM

## 2021-10-10 DIAGNOSIS — N183 Chronic kidney disease, stage 3 unspecified: Secondary | ICD-10-CM

## 2021-10-14 ENCOUNTER — Other Ambulatory Visit: Payer: Self-pay

## 2021-10-14 ENCOUNTER — Encounter (HOSPITAL_COMMUNITY): Payer: Self-pay | Admitting: Emergency Medicine

## 2021-10-14 ENCOUNTER — Emergency Department (HOSPITAL_COMMUNITY): Payer: PPO

## 2021-10-14 ENCOUNTER — Emergency Department (HOSPITAL_COMMUNITY)
Admission: EM | Admit: 2021-10-14 | Discharge: 2021-10-14 | Disposition: A | Discharge status: Home / Self Care | Payer: PPO | Attending: Student | Admitting: Student

## 2021-10-14 DIAGNOSIS — N183 Chronic kidney disease, stage 3 unspecified: Secondary | ICD-10-CM | POA: Diagnosis not present

## 2021-10-14 DIAGNOSIS — Z85850 Personal history of malignant neoplasm of thyroid: Secondary | ICD-10-CM | POA: Insufficient documentation

## 2021-10-14 DIAGNOSIS — I2693 Single subsegmental pulmonary embolism without acute cor pulmonale: Secondary | ICD-10-CM

## 2021-10-14 DIAGNOSIS — R059 Cough, unspecified: Secondary | ICD-10-CM | POA: Diagnosis not present

## 2021-10-14 DIAGNOSIS — Z79899 Other long term (current) drug therapy: Secondary | ICD-10-CM | POA: Diagnosis not present

## 2021-10-14 DIAGNOSIS — I129 Hypertensive chronic kidney disease with stage 1 through stage 4 chronic kidney disease, or unspecified chronic kidney disease: Secondary | ICD-10-CM | POA: Insufficient documentation

## 2021-10-14 DIAGNOSIS — E039 Hypothyroidism, unspecified: Secondary | ICD-10-CM | POA: Insufficient documentation

## 2021-10-14 DIAGNOSIS — R053 Chronic cough: Secondary | ICD-10-CM

## 2021-10-14 LAB — BASIC METABOLIC PANEL
Anion gap: 12 (ref 5–15)
BUN: 34 mg/dL — ABNORMAL HIGH (ref 8–23)
CO2: 27 mmol/L (ref 22–32)
Calcium: 7.5 mg/dL — ABNORMAL LOW (ref 8.9–10.3)
Chloride: 97 mmol/L — ABNORMAL LOW (ref 98–111)
Creatinine, Ser: 1.58 mg/dL — ABNORMAL HIGH (ref 0.44–1.00)
GFR, Estimated: 31 mL/min — ABNORMAL LOW (ref >60)
Glucose, Bld: 134 mg/dL — ABNORMAL HIGH (ref 70–99)
Potassium: 3.5 mmol/L (ref 3.5–5.1)
Sodium: 136 mmol/L (ref 135–145)

## 2021-10-14 LAB — CBC WITH DIFFERENTIAL/PLATELET
Abs Immature Granulocytes: 0.05 10*3/uL (ref 0.00–0.07)
Basophils Absolute: 0.1 10*3/uL (ref 0.0–0.1)
Basophils Relative: 0 %
Eosinophils Absolute: 0 10*3/uL (ref 0.0–0.5)
Eosinophils Relative: 0 %
HCT: 32.1 % — ABNORMAL LOW (ref 36.0–46.0)
Hemoglobin: 10.3 g/dL — ABNORMAL LOW (ref 12.0–15.0)
Immature Granulocytes: 0 %
Lymphocytes Relative: 8 %
Lymphs Abs: 0.9 10*3/uL (ref 0.7–4.0)
MCH: 28.6 pg (ref 26.0–34.0)
MCHC: 32.1 g/dL (ref 30.0–36.0)
MCV: 89.2 fL (ref 80.0–100.0)
Monocytes Absolute: 0.7 10*3/uL (ref 0.1–1.0)
Monocytes Relative: 6 %
Neutro Abs: 10 10*3/uL — ABNORMAL HIGH (ref 1.7–7.7)
Neutrophils Relative %: 86 %
Platelets: 281 10*3/uL (ref 150–400)
RBC: 3.6 MIL/uL — ABNORMAL LOW (ref 3.87–5.11)
RDW: 13.2 % (ref 11.5–15.5)
WBC: 11.8 10*3/uL — ABNORMAL HIGH (ref 4.0–10.5)
nRBC: 0 % (ref 0.0–0.2)

## 2021-10-14 MED ORDER — FLUTICASONE PROPIONATE 50 MCG/ACT NA SUSP: 2.0000 | Freq: Every day | NASAL | 2 refills | Status: DC

## 2021-10-14 MED ORDER — IPRATROPIUM-ALBUTEROL 0.5-2.5 (3) MG/3ML IN SOLN
3.0000 mL | Freq: Once | RESPIRATORY_TRACT | Status: AC
  Administered 2021-10-14: 3 mL via RESPIRATORY_TRACT
  Filled 2021-10-14: qty 3

## 2021-10-14 MED ORDER — PSEUDOEPHEDRINE HCL ER 120 MG PO TB12: 120.0000 mg | ORAL_TABLET | Freq: Two times a day (BID) | ORAL | 0 refills | Status: DC

## 2021-10-14 MED ORDER — APIXABAN (ELIQUIS) VTE STARTER PACK (10MG AND 5MG): ORAL_TABLET | ORAL | 0 refills | Status: DC

## 2021-10-14 MED ORDER — GUAIFENESIN ER 600 MG PO TB12: 600.0000 mg | ORAL_TABLET | Freq: Two times a day (BID) | ORAL | 0 refills | Status: AC

## 2021-10-14 MED ORDER — IOHEXOL 350 MG/ML SOLN
64.0000 mL | Freq: Once | INTRAVENOUS | Status: AC | PRN
  Administered 2021-10-14: 60 mL via INTRAVENOUS

## 2021-10-14 MED ORDER — APIXABAN 5 MG PO TABS
5.0000 mg | ORAL_TABLET | Freq: Once | ORAL | Status: AC
  Administered 2021-10-14: 5 mg via ORAL
  Filled 2021-10-14: qty 1

## 2021-10-14 NOTE — ED Triage Notes (Addendum)
Pt has chronic cough but much worse since yesterday morning, per grandmother. Pt has a hacking, strong cough. Spitting up yellow, thick sputum. A/o to most. Pt denies pain. Pt hoarse from coughing. No resp distress or sob noted.

## 2021-10-14 NOTE — ED Provider Notes (Signed)
[facility] EMERGENCY DEPARTMENT Provider Note  CSN: [id number] Arrival date & time: 10/14/21 1833  Chief Complaint(s) Cough  HPI [name] [name] is a 88 y.o. female with PMH CKD 3, hypothyroidism, HLD, chronic cough who presents emergency department for evaluation of a cough.  Patient arrives with the patient's daughter who states that she has had a cough for multiple years but over the last 24 hours she feels that this cough is worse.  States that the patient has persistent rhinorrhea that is making her cough worse.  The patient denies any chest pain, shortness of breath, pleurisy, hemoptysis abdominal pain, nausea, vomiting or other systemic symptoms.   Past Medical History Past Medical History:  Diagnosis Date   ALLERGIC RHINITIS 11/02/2006   CKD (chronic kidney disease), stage III (HCC)    HYPERLIPIDEMIA 11/04/2006   HYPOTHYROIDISM, POSTSURGICAL 03/28/2007   Malignant neoplasm of thyroid gland (HCC) 03/28/2007   5/07: righte lobectomy--path shows 3 cm follicular ca 8/07: completion left lobectomy, no more tumor seen 09/07: thyroglobulin 0.2 9ab neg) 11/07: i-131 rx, 101 mci 6/08: thyroglogulin 0.2 (ab neg) , hypothyroid body scan neg (clinically tolerated hypothyroidism poorly) 06/09: tg undetectable (ab=1.5, normal) 12/10: tg undetectable (ab neg) 6/11: tg undectable (ab neg)   OSTEOPENIA 11/02/2006   Patient Active Problem List   Diagnosis Date Noted   Essential hypertension 01/11/2021   Leg edema 12/01/2020   Plantar flexed metatarsal bone of left foot 11/24/2020   Plantar flexed metatarsal bone of right foot 11/24/2020   Chronic sinusitis 07/17/2019   Right leg weakness 10/15/2018   Corns and callus 09/25/2018   Shingles 04/05/2018   HLD (hyperlipidemia) 08/05/2017   CKD (chronic kidney disease), stage III (HCC) 08/05/2017   Numbness 11/19/2014   Malignant neoplasm of thyroid gland (HCC) 03/28/2007   HYPOTHYROIDISM, POSTSURGICAL 03/28/2007   HYPOPARATHYROIDISM 03/28/2007    Allergic rhinitis 11/02/2006   OSTEOPENIA 11/02/2006   Home Medication(s) Prior to Admission medications   Medication Sig Start Date End Date Taking? Authorizing Provider  azelastine (ASTELIN) 0.1 % nasal spray Place 2 sprays into both nostrils 2 (two) times daily. Use in each nostril as directed 10/06/21   Allwardt, Alyssa M, PA-C  benzonatate (TESSALON) 200 MG capsule TAKE 1 CAPSULE BY MOUTH TWICE DAILY AS NEEDED FOR COUGH 10/06/21   Allwardt, Alyssa M, PA-C  calcitRIOL (ROCALTROL) 0.5 MCG capsule Take 1 capsule (0.5 mcg total) by mouth daily. 01/18/21   Romero Belling, MD  docusate sodium (COLACE) 100 MG capsule Take 100 mg by mouth at bedtime.     [provider]  furosemide (LASIX) 40 MG tablet Take 1 tablet (40 mg total) by mouth 2 (two) times daily. 02/08/21   Ardith Dark, MD  gabapentin (NEURONTIN) 100 MG capsule Take 1 capsule (100 mg total) by mouth at bedtime. 09/13/21   Ardith Dark, MD  levocetirizine (XYZAL) 5 MG tablet Take 1 tablet (5 mg total) by mouth every evening. 10/06/21   Allwardt, Crist Infante, PA-C  levothyroxine (SYNTHROID) 112 MCG tablet Take 1 tablet (112 mcg total) by mouth daily. 09/06/21   Reather Littler, MD  nitrofurantoin, macrocrystal-monohydrate, (MACROBID) 100 MG capsule Take 1 capsule (100 mg total) by mouth 2 (two) times daily. 09/13/21   Ardith Dark, MD  triamcinolone ointment (KENALOG) 0.5 % Apply 1 application topically 2 (two) times daily. 01/24/21   Ardith Dark, MD  valACYclovir (VALTREX) 1000 MG tablet Take 1 tablet (1,000 mg total) by mouth daily. Take 1 pill daily for 5  days for outbreaks. 12/01/20   Ardith Dark, MD  vitamin C (ASCORBIC ACID) 500 MG tablet Take 1 tablet (500 mg total) by mouth daily. 01/14/14   Bradly Bienenstock, MD                                                                                                                                    Past Surgical History Past Surgical History:  Procedure Laterality Date   ABDOMINAL  HYSTERECTOMY     CERVICAL POLYPECTOMY     OPEN REDUCTION INTERNAL FIXATION (ORIF) DISTAL RADIAL FRACTURE Left 01/14/2014   Procedure: OPEN REDUCTION INTERNAL FIXATION DISTAL RADIAL FRACTURE;  Surgeon: Sharma Covert, MD;  Location: [facility] OR;  Service: Orthopedics;  Laterality: Left;   Family History Family History  Problem Relation Age of Onset   Diabetes Sister     Social History Social History   Tobacco Use   Smoking status: Never   Smokeless tobacco: Never  Substance Use Topics   Alcohol use: Not Currently   Drug use: Never   Allergies Verapamil  Review of Systems Review of Systems  HENT:  Positive for rhinorrhea.   Respiratory:  Positive for cough.     Physical Exam Vital Signs  I have reviewed the triage vital signs BP (!) 163/89 (BP Location: Right Arm)   Pulse 99   Temp 99.5 F (37.5 C) (Oral)   Resp 19   SpO2 97%   Physical Exam Vitals and nursing note reviewed.  Constitutional:      General: She is not in acute distress.    Appearance: She is well-developed.  HENT:     Head: Normocephalic and atraumatic.  Eyes:     Conjunctiva/sclera: Conjunctivae normal.  Cardiovascular:     Rate and Rhythm: Normal rate and regular rhythm.     Heart sounds: No murmur heard. Pulmonary:     Effort: Pulmonary effort is normal. No respiratory distress.     Breath sounds: Normal breath sounds.  Abdominal:     Palpations: Abdomen is soft.     Tenderness: There is no abdominal tenderness.  Musculoskeletal:        General: No swelling.     Cervical back: Neck supple.  Skin:    General: Skin is warm and dry.     Capillary Refill: Capillary refill takes less than 2 seconds.  Neurological:     Mental Status: She is alert.  Psychiatric:        Mood and Affect: Mood normal.     ED Results and Treatments Labs (all labs ordered are listed, but only abnormal results are displayed) Labs Reviewed  CBC WITH DIFFERENTIAL/PLATELET - Abnormal; Notable for the following  components:      Result Value   WBC 11.8 (*)    RBC 3.60 (*)    Hemoglobin 10.3 (*)    HCT 32.1 (*)    Neutro Abs 10.0 (*)  All other components within normal limits  BASIC METABOLIC PANEL - Abnormal; Notable for the following components:   Chloride 97 (*)    Glucose, Bld 134 (*)    BUN 34 (*)    Creatinine, Ser 1.58 (*)    Calcium 7.5 (*)    GFR, Estimated 31 (*)    All other components within normal limits                                                                                                                          Radiology DG Chest 2 View  Result Date: 10/14/2021 CLINICAL DATA:  Cough thick yellow sputum EXAM: CHEST - 2 VIEW COMPARISON:  09/06/2021 FINDINGS: Mild bronchitic changes without focal airspace disease, pleural effusion or pneumothorax. Mild cardiomegaly with aortic atherosclerosis. Degenerative changes of both shoulders with probable small calcified loose body on the left. IMPRESSION: Mild bronchitic changes without focal airspace disease. Mild cardiomegaly Electronically Signed   By: Jasmine Pang M.D.   On: 10/14/2021 19:16    Pertinent labs & imaging results that were available during my care of the patient were reviewed by me and considered in my medical decision making (see MDM for details).  Medications Ordered in ED Medications  ipratropium-albuterol (DUONEB) 0.5-2.5 (3) MG/3ML nebulizer solution 3 mL (has no administration in time range)                                                                                                                                     Procedures .Critical Care  Performed by: Glendora Score, MD Authorized by: Glendora Score, MD   Critical care provider statement:    Critical care time (minutes):  30   Critical care was necessary to treat or prevent imminent or life-threatening deterioration of the following conditions: PE with eliquis initiaion.   Critical care was time spent personally by me on the following  activities:  Development of treatment plan with patient or surrogate, discussions with consultants, evaluation of patient's response to treatment, examination of patient, ordering and review of laboratory studies, ordering and review of radiographic studies, ordering and performing treatments and interventions, pulse oximetry, re-evaluation of patient's condition and review of old charts   (including critical care time)  Medical Decision Making / ED Course   This patient presents to the ED for concern of cough, this involves an extensive number of treatment options, and  is a complaint that carries with it a high risk of complications and morbidity.  The differential diagnosis includes viral URI, postnasal drip, postviral cough, poorly tolerated oral secretions, pneumonia, PE, malignancy  MDM: Patient seen emergency department for evaluation of cough.  Physical exam reveals rhinorrhea that I am witnessing the patient inhale into her oropharynx and then cough out but is otherwise unremarkable.  Laboratory evaluation with mild leukocytosis to 11.8, hemoglobin 10.3 which is near baseline, BUN 34 and creatinine 1.58 which is baseline for this patient.  Chest x-ray with mild bronchitic changes but is otherwise unremarkable.  CT PE was obtained that shows a probable very small distal subsegmental in the right lower lobe.  I think that this is likely an incidental finding and not related the patient's chronic cough but I had an extensive discussion with the patient's daughter about the risk and benefits of hospital admission versus discharge with oral anticoagulants as well as the risk and benefits of anticoagulating at all.  It appears that despite her age of 22 years, she is fairly independent and does not have a history of frequent falls.  Using this extensive shared decision-making conversation, we decided that the patient will be discharged and started on Eliquis with very close PCP follow-up on Monday to  have a longer discussion about whether or not DOAC therapy should be continued in perpetuity.  We will aggressively treat her rhinorrhea with Flonase, Sudafed and Mucinex.  DuoNeb was tried with no relief of her cough here in the ER.  The patient has had no hypoxia here in the emergency department and no tachycardia only after DuoNeb therapy.  She is overall hemodynamically stable and it is not unreasonable to be discharged in the emergency department with close PCP follow-up and strict return precautions as is the wish of her family.  She was given very strict return precautions which include any chest pain, shortness of breath or hemoptysis of which they voiced understanding.  Patient then discharged with strict return precautions.  Additional history obtained: -Additional history obtained from daughter -External records from outside source obtained and reviewed including: Chart review including previous notes, labs, imaging, consultation notes   Lab Tests: -I ordered, reviewed, and interpreted labs.   The pertinent results include:   Labs Reviewed  CBC WITH DIFFERENTIAL/PLATELET - Abnormal; Notable for the following components:      Result Value   WBC 11.8 (*)    RBC 3.60 (*)    Hemoglobin 10.3 (*)    HCT 32.1 (*)    Neutro Abs 10.0 (*)    All other components within normal limits  BASIC METABOLIC PANEL - Abnormal; Notable for the following components:   Chloride 97 (*)    Glucose, Bld 134 (*)    BUN 34 (*)    Creatinine, Ser 1.58 (*)    Calcium 7.5 (*)    GFR, Estimated 31 (*)    All other components within normal limits      Imaging Studies ordered: I ordered imaging studies including chest x-ray, CT PE I independently visualized and interpreted imaging. I agree with the radiologist interpretation   Medicines ordered and prescription drug management: Meds ordered this encounter  Medications   ipratropium-albuterol (DUONEB) 0.5-2.5 (3) MG/3ML nebulizer solution 3 mL    -I  have reviewed the patients home medicines and have made adjustments as needed  Critical interventions Eliquis initiation   Cardiac Monitoring: The patient was maintained on a cardiac monitor.  I personally viewed and  interpreted the cardiac monitored which showed an underlying rhythm of: NSR  Social Determinants of Health:  Factors impacting patients care include: none   Reevaluation: After the interventions noted above, I reevaluated the patient and found that they have :improved  Co morbidities that complicate the patient evaluation  Past Medical History:  Diagnosis Date   ALLERGIC RHINITIS 11/02/2006   CKD (chronic kidney disease), stage III (HCC)    HYPERLIPIDEMIA 11/04/2006   HYPOTHYROIDISM, POSTSURGICAL 03/28/2007   Malignant neoplasm of thyroid gland (HCC) 03/28/2007   5/07: righte lobectomy--path shows 3 cm follicular ca 8/07: completion left lobectomy, no more tumor seen 09/07: thyroglobulin 0.2 9ab neg) 11/07: i-131 rx, 101 mci 6/08: thyroglogulin 0.2 (ab neg) , hypothyroid body scan neg (clinically tolerated hypothyroidism poorly) 06/09: tg undetectable (ab=1.5, normal) 12/10: tg undetectable (ab neg) 6/11: tg undectable (ab neg)   OSTEOPENIA 11/02/2006      Dispostion: I considered admission for this patient, and after extensive discussion with family, we shared decision making to discharge the patient with very close outpatient follow-up given her very small new PE that is likely incidental     Final Clinical Impression(s) / ED Diagnoses Final diagnoses:  None     @PCDICTATION @    Glendora Score, MD 10/15/21 [phone]

## 2021-10-17 ENCOUNTER — Other Ambulatory Visit: Payer: Self-pay

## 2021-10-17 ENCOUNTER — Emergency Department (HOSPITAL_COMMUNITY): Payer: PPO

## 2021-10-17 ENCOUNTER — Inpatient Hospital Stay (HOSPITAL_COMMUNITY)
Admission: EM | Admit: 2021-10-17 | Discharge: 2021-10-19 | DRG: 689 | Disposition: A | Discharge status: Skilled Nursing Facility | Payer: PPO | Attending: Internal Medicine | Admitting: Internal Medicine

## 2021-10-17 ENCOUNTER — Telehealth: Payer: Self-pay | Admitting: Family Medicine

## 2021-10-17 ENCOUNTER — Encounter (HOSPITAL_COMMUNITY): Payer: Self-pay | Admitting: *Deleted

## 2021-10-17 DIAGNOSIS — R053 Chronic cough: Secondary | ICD-10-CM | POA: Diagnosis present

## 2021-10-17 DIAGNOSIS — E039 Hypothyroidism, unspecified: Secondary | ICD-10-CM | POA: Diagnosis not present

## 2021-10-17 DIAGNOSIS — H9190 Unspecified hearing loss, unspecified ear: Secondary | ICD-10-CM | POA: Diagnosis not present

## 2021-10-17 DIAGNOSIS — E890 Postprocedural hypothyroidism: Secondary | ICD-10-CM | POA: Diagnosis present

## 2021-10-17 DIAGNOSIS — N390 Urinary tract infection, site not specified: Secondary | ICD-10-CM | POA: Diagnosis not present

## 2021-10-17 DIAGNOSIS — I10 Essential (primary) hypertension: Secondary | ICD-10-CM | POA: Diagnosis not present

## 2021-10-17 DIAGNOSIS — R059 Cough, unspecified: Secondary | ICD-10-CM | POA: Diagnosis not present

## 2021-10-17 DIAGNOSIS — N183 Chronic kidney disease, stage 3 unspecified: Secondary | ICD-10-CM | POA: Diagnosis not present

## 2021-10-17 DIAGNOSIS — B9620 Unspecified Escherichia coli [E. coli] as the cause of diseases classified elsewhere: Secondary | ICD-10-CM | POA: Diagnosis not present

## 2021-10-17 DIAGNOSIS — D631 Anemia in chronic kidney disease: Secondary | ICD-10-CM | POA: Diagnosis not present

## 2021-10-17 DIAGNOSIS — J309 Allergic rhinitis, unspecified: Secondary | ICD-10-CM | POA: Diagnosis not present

## 2021-10-17 DIAGNOSIS — Z9009 Acquired absence of other part of head and neck: Secondary | ICD-10-CM | POA: Diagnosis not present

## 2021-10-17 DIAGNOSIS — Z79890 Hormone replacement therapy (postmenopausal): Secondary | ICD-10-CM

## 2021-10-17 DIAGNOSIS — Z888 Allergy status to other drugs, medicaments and biological substances status: Secondary | ICD-10-CM

## 2021-10-17 DIAGNOSIS — N3001 Acute cystitis with hematuria: Secondary | ICD-10-CM

## 2021-10-17 DIAGNOSIS — E785 Hyperlipidemia, unspecified: Secondary | ICD-10-CM | POA: Diagnosis not present

## 2021-10-17 DIAGNOSIS — I2693 Single subsegmental pulmonary embolism without acute cor pulmonale: Secondary | ICD-10-CM | POA: Diagnosis present

## 2021-10-17 DIAGNOSIS — R0602 Shortness of breath: Secondary | ICD-10-CM | POA: Diagnosis not present

## 2021-10-17 DIAGNOSIS — R531 Weakness: Secondary | ICD-10-CM | POA: Diagnosis not present

## 2021-10-17 DIAGNOSIS — R1310 Dysphagia, unspecified: Secondary | ICD-10-CM | POA: Diagnosis present

## 2021-10-17 DIAGNOSIS — Z7401 Bed confinement status: Secondary | ICD-10-CM | POA: Diagnosis not present

## 2021-10-17 DIAGNOSIS — Z85850 Personal history of malignant neoplasm of thyroid: Secondary | ICD-10-CM

## 2021-10-17 DIAGNOSIS — I2699 Other pulmonary embolism without acute cor pulmonale: Secondary | ICD-10-CM | POA: Diagnosis not present

## 2021-10-17 DIAGNOSIS — E860 Dehydration: Secondary | ICD-10-CM | POA: Diagnosis present

## 2021-10-17 DIAGNOSIS — Z66 Do not resuscitate: Secondary | ICD-10-CM | POA: Diagnosis present

## 2021-10-17 DIAGNOSIS — N3000 Acute cystitis without hematuria: Secondary | ICD-10-CM | POA: Diagnosis not present

## 2021-10-17 DIAGNOSIS — Z79899 Other long term (current) drug therapy: Secondary | ICD-10-CM | POA: Diagnosis not present

## 2021-10-17 DIAGNOSIS — B029 Zoster without complications: Secondary | ICD-10-CM | POA: Diagnosis not present

## 2021-10-17 DIAGNOSIS — K5900 Constipation, unspecified: Secondary | ICD-10-CM | POA: Diagnosis not present

## 2021-10-17 DIAGNOSIS — Z7901 Long term (current) use of anticoagulants: Secondary | ICD-10-CM

## 2021-10-17 DIAGNOSIS — Z833 Family history of diabetes mellitus: Secondary | ICD-10-CM

## 2021-10-17 DIAGNOSIS — E876 Hypokalemia: Secondary | ICD-10-CM | POA: Diagnosis not present

## 2021-10-17 DIAGNOSIS — R29898 Other symptoms and signs involving the musculoskeletal system: Secondary | ICD-10-CM | POA: Diagnosis not present

## 2021-10-17 DIAGNOSIS — G629 Polyneuropathy, unspecified: Secondary | ICD-10-CM | POA: Diagnosis not present

## 2021-10-17 DIAGNOSIS — N1832 Chronic kidney disease, stage 3b: Secondary | ICD-10-CM | POA: Diagnosis not present

## 2021-10-17 DIAGNOSIS — I129 Hypertensive chronic kidney disease with stage 1 through stage 4 chronic kidney disease, or unspecified chronic kidney disease: Secondary | ICD-10-CM | POA: Diagnosis present

## 2021-10-17 DIAGNOSIS — N179 Acute kidney failure, unspecified: Secondary | ICD-10-CM | POA: Diagnosis present

## 2021-10-17 LAB — CBC WITH DIFFERENTIAL/PLATELET
Abs Immature Granulocytes: 0.03 10*3/uL (ref 0.00–0.07)
Basophils Absolute: 0.1 10*3/uL (ref 0.0–0.1)
Basophils Relative: 1 %
Eosinophils Absolute: 0.1 10*3/uL (ref 0.0–0.5)
Eosinophils Relative: 1 %
HCT: 32.4 % — ABNORMAL LOW (ref 36.0–46.0)
Hemoglobin: 10.6 g/dL — ABNORMAL LOW (ref 12.0–15.0)
Immature Granulocytes: 0 %
Lymphocytes Relative: 7 %
Lymphs Abs: 0.8 10*3/uL (ref 0.7–4.0)
MCH: 28.8 pg (ref 26.0–34.0)
MCHC: 32.7 g/dL (ref 30.0–36.0)
MCV: 88 fL (ref 80.0–100.0)
Monocytes Absolute: 0.8 10*3/uL (ref 0.1–1.0)
Monocytes Relative: 7 %
Neutro Abs: 9.7 10*3/uL — ABNORMAL HIGH (ref 1.7–7.7)
Neutrophils Relative %: 84 %
Platelets: 298 10*3/uL (ref 150–400)
RBC: 3.68 MIL/uL — ABNORMAL LOW (ref 3.87–5.11)
RDW: 13 % (ref 11.5–15.5)
WBC: 11.6 10*3/uL — ABNORMAL HIGH (ref 4.0–10.5)
nRBC: 0 % (ref 0.0–0.2)

## 2021-10-17 LAB — COMPREHENSIVE METABOLIC PANEL
ALT: 10 U/L (ref 0–44)
AST: 21 U/L (ref 15–41)
Albumin: 3.5 g/dL (ref 3.5–5.0)
Alkaline Phosphatase: 54 U/L (ref 38–126)
Anion gap: 12 (ref 5–15)
BUN: 39 mg/dL — ABNORMAL HIGH (ref 8–23)
CO2: 28 mmol/L (ref 22–32)
Calcium: 7.2 mg/dL — ABNORMAL LOW (ref 8.9–10.3)
Chloride: 93 mmol/L — ABNORMAL LOW (ref 98–111)
Creatinine, Ser: 1.72 mg/dL — ABNORMAL HIGH (ref 0.44–1.00)
GFR, Estimated: 28 mL/min — ABNORMAL LOW (ref >60)
Glucose, Bld: 123 mg/dL — ABNORMAL HIGH (ref 70–99)
Potassium: 3.2 mmol/L — ABNORMAL LOW (ref 3.5–5.1)
Sodium: 133 mmol/L — ABNORMAL LOW (ref 135–145)
Total Bilirubin: 1.4 mg/dL — ABNORMAL HIGH (ref 0.3–1.2)
Total Protein: 8.4 g/dL — ABNORMAL HIGH (ref 6.5–8.1)

## 2021-10-17 LAB — URINALYSIS, ROUTINE W REFLEX MICROSCOPIC
Bilirubin Urine: NEGATIVE
Glucose, UA: NEGATIVE mg/dL
Ketones, ur: NEGATIVE mg/dL
Nitrite: NEGATIVE
Protein, ur: 30 mg/dL — AB
Specific Gravity, Urine: 1.012 (ref 1.005–1.030)
WBC, UA: >50 WBC/hpf — ABNORMAL HIGH (ref 0–5)
pH: 5 (ref 5.0–8.0)

## 2021-10-17 LAB — MAGNESIUM: Magnesium: 1.9 mg/dL (ref 1.7–2.4)

## 2021-10-17 LAB — LACTIC ACID, PLASMA: Lactic Acid, Venous: 1.2 mmol/L (ref 0.5–1.9)

## 2021-10-17 MED ORDER — LEVOTHYROXINE SODIUM 112 MCG PO TABS
112.0000 ug | ORAL_TABLET | Freq: Every day | ORAL | Status: DC
  Administered 2021-10-18 – 2021-10-19 (×2): 112 ug via ORAL
  Filled 2021-10-17 (×2): qty 1

## 2021-10-17 MED ORDER — POTASSIUM CHLORIDE IN NACL 40-0.9 MEQ/L-% IV SOLN: INTRAVENOUS | Status: DC

## 2021-10-17 MED ORDER — SODIUM CHLORIDE 0.9 % IV SOLN
2.0000 g | INTRAVENOUS | Status: DC
  Administered 2021-10-18: 2 g via INTRAVENOUS
  Filled 2021-10-17: qty 20

## 2021-10-17 MED ORDER — APIXABAN 5 MG PO TABS
10.0000 mg | ORAL_TABLET | Freq: Two times a day (BID) | ORAL | Status: DC
  Administered 2021-10-17 – 2021-10-19 (×5): 10 mg via ORAL
  Filled 2021-10-17 (×6): qty 2

## 2021-10-17 MED ORDER — DOCUSATE SODIUM 100 MG PO CAPS
100.0000 mg | ORAL_CAPSULE | Freq: Every day | ORAL | Status: DC
  Administered 2021-10-17 – 2021-10-18 (×2): 100 mg via ORAL
  Filled 2021-10-17 (×2): qty 1

## 2021-10-17 MED ORDER — SODIUM CHLORIDE 0.9 % IV BOLUS
250.0000 mL | Freq: Once | INTRAVENOUS | Status: AC
  Administered 2021-10-17: 250 mL via INTRAVENOUS

## 2021-10-17 MED ORDER — POTASSIUM CHLORIDE 20 MEQ PO PACK
40.0000 meq | PACK | Freq: Once | ORAL | Status: AC
  Administered 2021-10-17: 40 meq via ORAL
  Filled 2021-10-17: qty 2

## 2021-10-17 MED ORDER — POLYETHYLENE GLYCOL 3350 17 G PO PACK: 17.0000 g | PACK | Freq: Every day | ORAL | Status: DC | PRN

## 2021-10-17 MED ORDER — ACETAMINOPHEN 325 MG PO TABS: 650.0000 mg | ORAL_TABLET | Freq: Four times a day (QID) | ORAL | Status: DC | PRN

## 2021-10-17 MED ORDER — SODIUM CHLORIDE 0.9 % IV SOLN
1.0000 g | Freq: Once | INTRAVENOUS | Status: AC
  Administered 2021-10-17: 1 g via INTRAVENOUS
  Filled 2021-10-17: qty 10

## 2021-10-17 MED ORDER — ACETAMINOPHEN 650 MG RE SUPP: 650.0000 mg | Freq: Four times a day (QID) | RECTAL | Status: DC | PRN

## 2021-10-17 MED ORDER — SODIUM CHLORIDE 0.9 % IV SOLN
100.0000 mg | Freq: Two times a day (BID) | INTRAVENOUS | Status: DC
  Administered 2021-10-17 – 2021-10-19 (×4): 100 mg via INTRAVENOUS
  Filled 2021-10-17 (×8): qty 100

## 2021-10-17 MED ORDER — FLUTICASONE PROPIONATE 50 MCG/ACT NA SUSP
2.0000 | Freq: Every day | NASAL | Status: DC
  Administered 2021-10-17 – 2021-10-19 (×3): 2 via NASAL
  Filled 2021-10-17 (×2): qty 16

## 2021-10-17 MED ORDER — GUAIFENESIN-DM 100-10 MG/5ML PO SYRP
10.0000 mL | ORAL_SOLUTION | Freq: Three times a day (TID) | ORAL | Status: AC
  Administered 2021-10-17 – 2021-10-18 (×4): 10 mL via ORAL
  Filled 2021-10-17 (×4): qty 10

## 2021-10-17 MED ORDER — IPRATROPIUM-ALBUTEROL 0.5-2.5 (3) MG/3ML IN SOLN
3.0000 mL | Freq: Once | RESPIRATORY_TRACT | Status: AC
  Administered 2021-10-17: 3 mL via RESPIRATORY_TRACT
  Filled 2021-10-17: qty 3

## 2021-10-17 NOTE — ED Triage Notes (Signed)
Pt brought in from home by [facility] with c/o weakness that started yesterday. Granddaughter reported to EMS that pt was so weak she wasn't able to hold her up and she slid down into a recliner this morning. Denies injury. Pt has a strong, productive cough on arrival to ED.

## 2021-10-17 NOTE — Telephone Encounter (Signed)
See note

## 2021-10-17 NOTE — Assessment & Plan Note (Signed)
Stable. -Hold Lasix for now

## 2021-10-17 NOTE — Assessment & Plan Note (Signed)
Potassium 3.2.  Likely from recent poor oral intake and Lasix.  Magnesium

## 2021-10-17 NOTE — Assessment & Plan Note (Addendum)
Creatinine 1.7, baseline 1.4-1.5. CKD stage 3B. -Hold Lasix 40 mg BID for now - n/s + 40 kcl 75cc/hr x 12hrs

## 2021-10-17 NOTE — Assessment & Plan Note (Signed)
CTA chest done for chronic cough 7/7-showed probable small subsegmental right lower lobe pulmonary embolus.  Multiple small pulmonary nodules. -She was started on Eliquis 7/7.  And has been compliant. -If high risk for falls may need to readdress with family- benefits versus risk of anticoagulation

## 2021-10-17 NOTE — ED Notes (Signed)
Assisted patient to bedside toilet

## 2021-10-17 NOTE — H&P (Addendum)
History and Physical    LESLEA VOWLES YBO:[id number] DOB: Feb 04, 1928 DOA: 10/17/2021  PCP: Vivi Barrack, MD   Patient coming from: Home  I have personally briefly reviewed patient's old medical records in [place]  Chief Complaint: Weakness  HPI: ECHO PROPP is a 88 y.o. female with medical history significant for hypertension, CKD 3.  Patient was brought to the ED with complaints of generalized weakness, unable to ambulate, and worsening cough. History is mostly obtained from patient's granddaughter Johney Frame at bedside, who patient now lives with.  Patient is extremely hard of hearing. Granddaughter reports patient has a chronic productive cough present for years, normally she would cough for about 2 hours and be done for the day, but she reports that since Friday, patient has been coughing continuously throughout the day.  No fevers no chills.  No difficulty breathing. Over the past 3 days, patient has had progressive weakness, she was barely able to use her walker the past 2 days, but today she was unable to get up. Patient otherwise has not complained of any pain.  No vomiting no loose stools.  Patient was in the ED 7/7 for chronic cough, CTA of the chest showed small subsegmental right lower lobe PE.  She was started on Eliquis.  ED Course: Tmax 98.9.  Heart rate 69-95.  Respirate rate 11-22.  Blood pressure today 114-138.  O2 sats 90- 100% on room air. WBC 11.6.  Lactic acid 1.2.  Hypokalemia 3.2. UA with large leukocytes few bacteria. Chest x-ray showing mild edema of versus developing infection. IV ceftriaxone started for UTI.  Hospitalist to admit for generalized weakness and UTI.  Review of Systems: Limited due to hearing difficulties.  Past Medical History:  Diagnosis Date   ALLERGIC RHINITIS 11/02/2006   CKD (chronic kidney disease), stage III ([place])    HYPERLIPIDEMIA 11/04/2006   HYPOTHYROIDISM, POSTSURGICAL 03/28/2007   Malignant neoplasm of thyroid  gland ([place]) 03/28/2007   5/07: righte lobectomy--path shows 3 cm follicular ca 2/77: completion left lobectomy, no more tumor seen 09/07: thyroglobulin 0.2 9ab neg) 11/07: i-131 rx, 101 mci 6/08: thyroglogulin 0.2 (ab neg) , hypothyroid body scan neg (clinically tolerated hypothyroidism poorly) 06/09: tg undetectable (ab=1.5, normal) 12/10: tg undetectable (ab neg) 6/11: tg undectable (ab neg)   OSTEOPENIA 11/02/2006    Past Surgical History:  Procedure Laterality Date   ABDOMINAL HYSTERECTOMY     CERVICAL POLYPECTOMY     OPEN REDUCTION INTERNAL FIXATION (ORIF) DISTAL RADIAL FRACTURE Left 01/14/2014   Procedure: OPEN REDUCTION INTERNAL FIXATION DISTAL RADIAL FRACTURE;  Surgeon: Linna Hoff, MD;  Location: [facility];  Service: Orthopedics;  Laterality: Left;     reports that she has never smoked. She has never used smokeless tobacco. She reports that she does not currently use alcohol. She reports that she does not use drugs.  Allergies  Allergen Reactions   Verapamil Other (See Comments)    edema     Family History  Problem Relation Age of Onset   Diabetes Sister     Prior to Admission medications   Medication Sig Start Date End Date Taking? Authorizing Provider  APIXABAN Arne Cleveland) VTE STARTER PACK ('10MG'$  AND '5MG'$ ) Take as directed on package: start with two-'5mg'$  tablets twice daily for 7 days. On day 8, switch to one-'5mg'$  tablet twice daily. Patient taking differently: Take 10 mg by mouth in the morning and at bedtime. Take as directed on package: start with two-'5mg'$  tablets twice daily for 7 days.  On day 8, switch to one-'5mg'$  tablet twice daily. 10/14/21  Yes Kommor, Madison, MD  benzonatate (TESSALON) 200 MG capsule TAKE 1 CAPSULE BY MOUTH TWICE DAILY AS NEEDED FOR COUGH 10/06/21  Yes Allwardt, Alyssa M, PA-C  docusate sodium (COLACE) 100 MG capsule Take 100 mg by mouth at bedtime.    Yes [provider]  fluticasone (FLONASE) 50 MCG/ACT nasal spray Place 2 sprays into both nostrils  daily. 10/14/21 11/13/21 Yes Kommor, Madison, MD  furosemide (LASIX) 40 MG tablet Take 1 tablet (40 mg total) by mouth 2 (two) times daily. 02/08/21  Yes Vivi Barrack, MD  guaiFENesin (MUCINEX) 600 MG 12 hr tablet Take 1 tablet (600 mg total) by mouth 2 (two) times daily. 10/14/21 11/13/21 Yes Kommor, Madison, MD  levothyroxine (SYNTHROID) 112 MCG tablet Take 1 tablet (112 mcg total) by mouth daily. 09/06/21  Yes Elayne Snare, MD    Physical Exam: Vitals:   10/17/21 1330 10/17/21 1345 10/17/21 1348 10/17/21 1349  BP:  115/71 115/71   Pulse: 69 83    Resp: 14 (!) 21    Temp:    98.2 F (36.8 C)  TempSrc:    Oral  SpO2: 95% 90%    Weight:      Height:        Constitutional: Very hard of hearing, but otherwise calm, comfortable Vitals:   10/17/21 1330 10/17/21 1345 10/17/21 1348 10/17/21 1349  BP:  115/71 115/71   Pulse: 69 83    Resp: 14 (!) 21    Temp:    98.2 F (36.8 C)  TempSrc:    Oral  SpO2: 95% 90%    Weight:      Height:       Eyes: PERRL, lids and conjunctivae normal ENMT: Mucous membranes are moist.  Neck: normal, supple, no masses, no thyromegaly Respiratory: clear to auscultation bilaterally, no wheezing, no crackles.  Cardiovascular: Regular rate and rhythm, no murmurs / rubs / gallops. No extremity edema.  Lower extremities warm.   Abdomen: no tenderness, no masses palpated. No hepatosplenomegaly. Bowel sounds positive.  Musculoskeletal: no clubbing / cyanosis. No joint deformity upper and lower extremities.  Skin: no rashes, lesions, ulcers. No induration Neurologic: Limited as patient is hard of hearing, but able to move all extremities against gravity.   Psychiatric: Limited due to hearing challenges but she is able to tell me her name.   Labs on Admission: I have personally reviewed following labs and imaging studies  CBC: Recent Labs  Lab 10/14/21 1859 10/17/21 0956  WBC 11.8* 11.6*  NEUTROABS 10.0* 9.7*  HGB 10.3* 10.6*  HCT 32.1* 32.4*  MCV 89.2 88.0   PLT 281 485   Basic Metabolic Panel: Recent Labs  Lab 10/14/21 1859 10/17/21 0956  NA 136 133*  K 3.5 3.2*  CL 97* 93*  CO2 27 28  GLUCOSE 134* 123*  BUN 34* 39*  CREATININE 1.58* 1.72*  CALCIUM 7.5* 7.2*   Liver Function Tests: Recent Labs  Lab 10/17/21 0956  AST 21  ALT 10  ALKPHOS 54  BILITOT 1.4*  PROT 8.4*  ALBUMIN 3.5   Urine analysis:    Component Value Date/Time   COLORURINE YELLOW 10/17/2021 0956   APPEARANCEUR HAZY (A) 10/17/2021 0956   LABSPEC 1.012 10/17/2021 0956   PHURINE 5.0 10/17/2021 0956   GLUCOSEU NEGATIVE 10/17/2021 0956   HGBUR MODERATE (A) 10/17/2021 0956   BILIRUBINUR NEGATIVE 10/17/2021 0956   BILIRUBINUR negative 09/13/2021 [place] 10/17/2021  6606   PROTEINUR 30 (A) 10/17/2021 0956   UROBILINOGEN 0.2 09/13/2021 1157   UROBILINOGEN 1.0 03/13/2011 0011   NITRITE NEGATIVE 10/17/2021 0956   LEUKOCYTESUR LARGE (A) 10/17/2021 0956    Radiological Exams on Admission: DG Chest Port 1 View  Result Date: 10/17/2021 CLINICAL DATA:  Productive, weakness EXAM: PORTABLE CHEST 1 VIEW COMPARISON:  10/14/2021 FINDINGS: Stable cardiomediastinal contours. Aortic atherosclerosis. Subtly increased interstitial markings within the perihilar and bibasilar regions. No focal consolidation. No pleural effusion or pneumothorax. Advanced degenerative changes of the shoulders. Bones are demineralized. IMPRESSION: Subtly increased interstitial markings within the perihilar and bibasilar regions may reflect mild edema versus developing atypical/viral infection. Electronically Signed   By: Davina Poke D.O.   On: 10/17/2021 11:00    EKG: Independently reviewed.  Sinus rhythm rate 78, PACs present.  QTc 493.  Old Left bundle branch block.  No significant change from prior.  Assessment/Plan Principal Problem:   Generalized weakness Active Problems:   CKD (chronic kidney disease), stage III (HCC)   UTI (urinary tract infection)   Essential  hypertension   Assessment and Plan: * Generalized weakness Possible UTI, with chest x-ray suggesting developing infection.  She has worsening productive cough.  She is afebrile.  Mild leukocytosis of 11.6.  Not meeting sepsis criteria.  Possibly increasing age and frailty contributing.  Recent urine cultures growing pansensitive E. Coli- 6/6. -IV ceftriaxone for UTI, will add IV doxycycline (QTc borderline at 493) -Mucolytics as needed -Please follow-up urine cultures  Hypokalemia Potassium 3.2.  Likely from recent poor oral intake and Lasix.  Magnesium  Pulmonary embolism (HCC) CTA chest done for chronic cough 7/7-showed probable small subsegmental right lower lobe pulmonary embolus.  Multiple small pulmonary nodules. -She was started on Eliquis 7/7.  And has been compliant. -If high risk for falls may need to readdress with family- benefits versus risk of anticoagulation  Essential hypertension Stable. -Hold Lasix for now  CKD (chronic kidney disease), stage III (HCC) Creatinine 1.7, baseline 1.4-1.5. CKD stage 3B. -Hold Lasix 40 mg BID for now - n/s + 40 kcl 75cc/hr x 12hrs     DVT prophylaxis:  Eliquis Code Status: DNR-confirmed with patient's granddaughter Johney Frame at bedside. Family Communication: Granddaughter Johney Frame at bedside.  Patient's daughter Debbe Bales is HCPOA. Disposition Plan: ~ 2 days Consults called: None Admission status:  Obs tele  Author: Bethena Roys, MD 10/17/2021 6:05 PM  For on call review [email or web address].

## 2021-10-17 NOTE — Assessment & Plan Note (Addendum)
Possible UTI, with chest x-ray suggesting developing infection.  She has worsening productive cough.  She is afebrile.  Mild leukocytosis of 11.6.  Not meeting sepsis criteria.  Possibly increasing age and frailty contributing.  Recent urine cultures growing pansensitive E. Coli- 6/6. -IV ceftriaxone for UTI, will add IV doxycycline (QTc borderline at 493) -Mucolytics as needed -Please follow-up urine cultures

## 2021-10-17 NOTE — Telephone Encounter (Signed)
Patient's granddaughter stated Patient was seen at ED over the weekend and was diagnosed with a blood clot. ED told Patient that she must be seen by PCP today (10/17/21), however, caller states Patient is too weak to move and that Patient has not eaten much since 10/14/21 and feels Patient is a fall risk.  Transferred to Triage.

## 2021-10-17 NOTE — ED Notes (Signed)
ED Provider at bedside. 

## 2021-10-17 NOTE — Telephone Encounter (Signed)
Noted. Agree with her going to ED.  Algis Greenhouse. Jerline Pain, MD 10/17/2021 11:18 AM

## 2021-10-17 NOTE — ED Provider Notes (Signed)
[facility] EMERGENCY DEPARTMENT Provider Note   CSN: [id number] Arrival date & time: 10/17/21  0935     History  Chief Complaint  Patient presents with   Weakness    [name] [name] is a 88 y.o. female.   Weakness Associated symptoms: cough   Associated symptoms: no abdominal pain, no arthralgias, no chest pain, no dizziness, no fever, no headaches, no myalgias, no nausea, no shortness of breath and no vomiting        [name] [name] is a 88 y.o. female with PMH of CKD, hypothyroidism who was seen here on Friday for evaluation of persistent cough and found to have a very small PE.  Started on Eliquis.  She returns to the Emergency Department complaining of generalized weakness.  Patient's granddaughter who is also her caregiver states that she lives with her and she has been too weak to stand or walk on her own for several days.  This morning, granddaughter attempted to help her stand and she fell back into a recliner chair.  She denies any fall to the floor or injury.  She has not had her Eliquis dose today.  She denies any chest pain or shortness of breath.  She has had a chronic cough for some time, granddaughter states cough is productive of thick sputum at times.  No fever or chills.  Home Medications Prior to Admission medications   Medication Sig Start Date End Date Taking? Authorizing Provider  APIXABAN Arne Cleveland) VTE STARTER PACK ('10MG'$  AND '5MG'$ ) Take as directed on package: start with two-'5mg'$  tablets twice daily for 7 days. On day 8, switch to one-'5mg'$  tablet twice daily. Patient taking differently: Take 10 mg by mouth in the morning and at bedtime. Take as directed on package: start with two-'5mg'$  tablets twice daily for 7 days. On day 8, switch to one-'5mg'$  tablet twice daily. 10/14/21  Yes Kommor, Madison, MD  benzonatate (TESSALON) 200 MG capsule TAKE 1 CAPSULE BY MOUTH TWICE DAILY AS NEEDED FOR COUGH 10/06/21  Yes Allwardt, Alyssa M, PA-C  docusate sodium (COLACE) 100 MG capsule  Take 100 mg by mouth at bedtime.    Yes [provider]  fluticasone (FLONASE) 50 MCG/ACT nasal spray Place 2 sprays into both nostrils daily. 10/14/21 11/13/21 Yes Kommor, Madison, MD  furosemide (LASIX) 40 MG tablet Take 1 tablet (40 mg total) by mouth 2 (two) times daily. 02/08/21  Yes Vivi Barrack, MD  guaiFENesin (MUCINEX) 600 MG 12 hr tablet Take 1 tablet (600 mg total) by mouth 2 (two) times daily. 10/14/21 11/13/21 Yes Kommor, Madison, MD  levothyroxine (SYNTHROID) 112 MCG tablet Take 1 tablet (112 mcg total) by mouth daily. 09/06/21  Yes Elayne Snare, MD      Allergies    Verapamil    Review of Systems   Review of Systems  Constitutional:  Negative for appetite change, chills and fever.  Respiratory:  Positive for cough. Negative for shortness of breath and wheezing.   Cardiovascular:  Negative for chest pain.  Gastrointestinal:  Negative for abdominal pain, nausea and vomiting.  Genitourinary:  Negative for difficulty urinating and flank pain.  Musculoskeletal:  Negative for arthralgias, back pain and myalgias.  Neurological:  Positive for weakness. Negative for dizziness, syncope, numbness and headaches.    Physical Exam Updated Vital Signs BP 117/68   Pulse 77   Temp 98.2 F (36.8 C) (Oral)   Resp (!) 21   Ht '5\' 3"'$  (1.6 m)   Wt 57.4 kg  SpO2 90%   BMI 22.43 kg/m  Physical Exam Vitals and nursing note reviewed.  Constitutional:      General: She is not in acute distress.    Appearance: Normal appearance. She is not ill-appearing or toxic-appearing.  HENT:     Mouth/Throat:     Mouth: Mucous membranes are dry.  Eyes:     Conjunctiva/sclera: Conjunctivae normal.  Cardiovascular:     Rate and Rhythm: Normal rate and regular rhythm.     Pulses: Normal pulses.  Pulmonary:     Effort: Pulmonary effort is normal. No respiratory distress.     Breath sounds: No wheezing or rales.  Abdominal:     Palpations: Abdomen is soft.     Tenderness: There is no abdominal  tenderness.  Musculoskeletal:        General: No swelling.     Right lower leg: No edema.     Left lower leg: No edema.  Skin:    General: Skin is warm.     Capillary Refill: Capillary refill takes less than 2 seconds.     Findings: No erythema or rash.  Neurological:     General: No focal deficit present.     Mental Status: She is alert.     Sensory: No sensory deficit.     ED Results / Procedures / Treatments   Labs (all labs ordered are listed, but only abnormal results are displayed) Labs Reviewed  COMPREHENSIVE METABOLIC PANEL - Abnormal; Notable for the following components:      Result Value   Sodium 133 (*)    Potassium 3.2 (*)    Chloride 93 (*)    Glucose, Bld 123 (*)    BUN 39 (*)    Creatinine, Ser 1.72 (*)    Calcium 7.2 (*)    Total Protein 8.4 (*)    Total Bilirubin 1.4 (*)    GFR, Estimated 28 (*)    All other components within normal limits  CBC WITH DIFFERENTIAL/PLATELET - Abnormal; Notable for the following components:   WBC 11.6 (*)    RBC 3.68 (*)    Hemoglobin 10.6 (*)    HCT 32.4 (*)    Neutro Abs 9.7 (*)    All other components within normal limits  URINALYSIS, ROUTINE W REFLEX MICROSCOPIC - Abnormal; Notable for the following components:   APPearance HAZY (*)    Hgb urine dipstick MODERATE (*)    Protein, ur 30 (*)    Leukocytes,Ua LARGE (*)    WBC, UA >50 (*)    Bacteria, UA FEW (*)    All other components within normal limits  URINE CULTURE  LACTIC ACID, PLASMA    EKG None  Radiology DG Chest Port 1 View  Result Date: 10/17/2021 CLINICAL DATA:  Productive, weakness EXAM: PORTABLE CHEST 1 VIEW COMPARISON:  10/14/2021 FINDINGS: Stable cardiomediastinal contours. Aortic atherosclerosis. Subtly increased interstitial markings within the perihilar and bibasilar regions. No focal consolidation. No pleural effusion or pneumothorax. Advanced degenerative changes of the shoulders. Bones are demineralized. IMPRESSION: Subtly increased  interstitial markings within the perihilar and bibasilar regions may reflect mild edema versus developing atypical/viral infection. Electronically Signed   By: Davina Poke D.O.   On: 10/17/2021 11:00    Procedures Procedures    Medications Ordered in ED Medications  apixaban (ELIQUIS) tablet 10 mg (10 mg Oral Given 10/17/21 1535)  sodium chloride 0.9 % bolus 250 mL (0 mLs Intravenous Stopped 10/17/21 1217)  ipratropium-albuterol (DUONEB) 0.5-2.5 (3) MG/3ML nebulizer solution  3 mL (3 mLs Nebulization Given 10/17/21 1045)  cefTRIAXone (ROCEPHIN) 1 g in sodium chloride 0.9 % 100 mL IVPB (1 g Intravenous New Bag/Given 10/17/21 1536)    ED Course/ Medical Decision Making/ A&P                           Medical Decision Making Patient here from home for evaluation of generalized weakness.  Seen here 3 days ago for cough, found to have very small PE.  Started on Eliquis.  Caregiver having difficulty managing at home over the weekend due to her weakness.  Patient has had decreased appetite and difficulty with standing and walking secondary to weakness.  On exam, patient frail elderly appearing.  Vital signs reassuring.  No increased work of breathing.  Actively coughing and spitting.  No hypoxia.  Missed her Eliquis dose today.  No focal neurodeficits on exam.  She is mentating well.  Hard of hearing.  Will check labs including urinalysis.  No concerning symptoms for sepsis or acute neurologic process.  Amount and/or Complexity of Data Reviewed Labs: ordered.    Details: Labs interpreted by me, no significant E leukocytosis.  Chemistries show elevated serum creatinine and BUN, creatinine near baseline.  Mild hypokalemia.  Lactic acid is unremarkable.  Urinalysis showed moderate hemoglobin large leukocytes and greater than 50 white cells.  Urine culture pending. Radiology: ordered.    Details: Chest x-ray with subtly increased interstitial markings in the perihilar and bibasilar regions Discussion  of management or test interpretation with external provider(s): Patient ER return from this weekend.  She was found to have a very small subsegmental PE and started on Eliquis.  Brought in by family member for increased generalized weakness.  Found to have UTI today.  She does not appear septic and had reassuring lactic acid.  She will be started on IV Rocephin here.  She will likely need hospital admission and possible evaluation for nursing home placement.  Discussed findings with [facility] hospitalist, Dr. Denton Brick who is agreeable to admit.  Risk Prescription drug management. Decision regarding hospitalization.           Final Clinical Impression(s) / ED Diagnoses Final diagnoses:  Generalized weakness  Acute cystitis with hematuria    Rx / DC Orders ED Discharge Orders     None         Kem Parkinson, PA-C 10/17/21 1706    Isla Pence, MD 10/18/21 [phone]

## 2021-10-17 NOTE — Telephone Encounter (Signed)
Patient at ed now   Patient Name: [name] [name] [facility] Gender: Female DOB: 02-Jun-1927 Age: 88 Y 3 M 24 D Return Phone Number: [phone] (Primary) Address: City/ State/ Zip: [place]  [address] Client [place] [facility] [place] Client Site [place] [facility] [place] [facility] Provider [name]- MD Contact Type Call Who Is Calling Patient / Member / Family / Caregiver Call Type Triage / Clinical Caller Name Johney Frame Relationship To Patient Grandchild Return Phone Number [phone] (Primary) Chief Complaint Weakness, Generalized Reason for Call Symptomatic / Request for [place] states the patient was in the ER with a blood clot. The patient is very weak and has not eaten much since Friday. They are fall risk. Translation No Nurse Assessment Nurse: [place], RN, Estill Bamberg Date/Time (Eastern Time): 10/17/2021 8:46:16 AM Confirm and document reason for call. If symptomatic, describe symptoms. ---caller states her grandmother went to ER Friday for cough and a blood clot was found PE. was told to give her medication and to see the doctor today. getting weaker and not eating much over the weekend. this morning was hard for her to take 2 steps to get the the potty chair. she had a slight fall into the lift chair. Does the patient have any new or worsening symptoms? ---Yes Will a triage be completed? ---Yes Related visit to physician within the last 2 weeks? ---Yes Does the PT have any chronic conditions? (i.e. diabetes, asthma, this includes High risk factors for pregnancy, etc.) ---Yes List chronic conditions. ---PE on blood thinner Is this a behavioral health or substance abuse call? ---No Guidelines Guideline Title Affirmed Question Affirmed Notes Nurse Date/Time (Eastern Time) Weakness (Generalized) and Fatigue Difficult to awaken or acting confused (e.g., Hum[place]et, RN, Estill Bamberg 10/17/2021  8:49:07 AM PLEASE NOTE: All timestamps contained within this report are represented as [place] Standard Time. CONFIDENTIALTY NOTICE: This fax transmission is intended only for the addressee. It contains information that is legally privileged, confidential or otherwise protected from use or disclosure. If you are not the intended recipient, you are strictly prohibited from reviewing, disclosing, copying using or disseminating any of this information or taking any action in reliance on or regarding this information. If you have received this fax in error, please notify us immediately by telephone so that we can arrange for its return to [place]. Phone: [phone], Toll-Free: [phone], Fax: [phone] Page: 2 of 2 Call Id: [id number] Guidelines Guideline Title Affirmed Question Affirmed Notes Nurse Date/Time Eilene Ghazi Time) disoriented, slurred speech) Disp. Time Eilene Ghazi Time) Disposition Final User 10/17/2021 8:51:47 AM Call EMS 911 Now Yes Humfleet, RN, Estill Bamberg 10/17/2021 8:58:10 AM 911 Outcome Documentation Humfleet, RN, Estill Bamberg Reason: going to call 911 Final Disposition 10/17/2021 8:51:47 AM Call EMS 911 Now Yes Humfleet, RN, Shelly Coss Disagree/Comply Comply Caller Understands Yes PreDisposition Did not know what to do Care Advice Given Per Guideline CALL EMS 911 NOW: * Immediate medical attention is needed. You need to hang up and call 911 (or an ambulance). CARE ADVICE given per Weakness and Fatigue (Adult) guideline.

## 2021-10-18 DIAGNOSIS — E876 Hypokalemia: Secondary | ICD-10-CM

## 2021-10-18 DIAGNOSIS — I10 Essential (primary) hypertension: Secondary | ICD-10-CM | POA: Diagnosis not present

## 2021-10-18 DIAGNOSIS — R531 Weakness: Secondary | ICD-10-CM | POA: Diagnosis present

## 2021-10-18 DIAGNOSIS — Z79890 Hormone replacement therapy (postmenopausal): Secondary | ICD-10-CM | POA: Diagnosis not present

## 2021-10-18 DIAGNOSIS — R053 Chronic cough: Secondary | ICD-10-CM | POA: Diagnosis present

## 2021-10-18 DIAGNOSIS — D631 Anemia in chronic kidney disease: Secondary | ICD-10-CM | POA: Diagnosis present

## 2021-10-18 DIAGNOSIS — Z85850 Personal history of malignant neoplasm of thyroid: Secondary | ICD-10-CM | POA: Diagnosis not present

## 2021-10-18 DIAGNOSIS — N1832 Chronic kidney disease, stage 3b: Secondary | ICD-10-CM | POA: Diagnosis present

## 2021-10-18 DIAGNOSIS — B9620 Unspecified Escherichia coli [E. coli] as the cause of diseases classified elsewhere: Secondary | ICD-10-CM | POA: Diagnosis present

## 2021-10-18 DIAGNOSIS — Z79899 Other long term (current) drug therapy: Secondary | ICD-10-CM | POA: Diagnosis not present

## 2021-10-18 DIAGNOSIS — E860 Dehydration: Secondary | ICD-10-CM | POA: Diagnosis present

## 2021-10-18 DIAGNOSIS — I129 Hypertensive chronic kidney disease with stage 1 through stage 4 chronic kidney disease, or unspecified chronic kidney disease: Secondary | ICD-10-CM | POA: Diagnosis present

## 2021-10-18 DIAGNOSIS — E785 Hyperlipidemia, unspecified: Secondary | ICD-10-CM | POA: Diagnosis present

## 2021-10-18 DIAGNOSIS — N390 Urinary tract infection, site not specified: Secondary | ICD-10-CM | POA: Diagnosis present

## 2021-10-18 DIAGNOSIS — Z833 Family history of diabetes mellitus: Secondary | ICD-10-CM | POA: Diagnosis not present

## 2021-10-18 DIAGNOSIS — E890 Postprocedural hypothyroidism: Secondary | ICD-10-CM | POA: Diagnosis present

## 2021-10-18 DIAGNOSIS — I2693 Single subsegmental pulmonary embolism without acute cor pulmonale: Secondary | ICD-10-CM | POA: Diagnosis present

## 2021-10-18 DIAGNOSIS — R1310 Dysphagia, unspecified: Secondary | ICD-10-CM | POA: Diagnosis present

## 2021-10-18 DIAGNOSIS — Z66 Do not resuscitate: Secondary | ICD-10-CM | POA: Diagnosis present

## 2021-10-18 DIAGNOSIS — Z888 Allergy status to other drugs, medicaments and biological substances status: Secondary | ICD-10-CM | POA: Diagnosis not present

## 2021-10-18 DIAGNOSIS — Z7901 Long term (current) use of anticoagulants: Secondary | ICD-10-CM | POA: Diagnosis not present

## 2021-10-18 DIAGNOSIS — H9190 Unspecified hearing loss, unspecified ear: Secondary | ICD-10-CM | POA: Diagnosis present

## 2021-10-18 LAB — CBC
HCT: 30.2 % — ABNORMAL LOW (ref 36.0–46.0)
Hemoglobin: 9.5 g/dL — ABNORMAL LOW (ref 12.0–15.0)
MCH: 28.1 pg (ref 26.0–34.0)
MCHC: 31.5 g/dL (ref 30.0–36.0)
MCV: 89.3 fL (ref 80.0–100.0)
Platelets: 249 10*3/uL (ref 150–400)
RBC: 3.38 MIL/uL — ABNORMAL LOW (ref 3.87–5.11)
RDW: 13.1 % (ref 11.5–15.5)
WBC: 8.8 10*3/uL (ref 4.0–10.5)
nRBC: 0 % (ref 0.0–0.2)

## 2021-10-18 LAB — BASIC METABOLIC PANEL
Anion gap: 7 (ref 5–15)
BUN: 38 mg/dL — ABNORMAL HIGH (ref 8–23)
CO2: 24 mmol/L (ref 22–32)
Calcium: 6.5 mg/dL — ABNORMAL LOW (ref 8.9–10.3)
Chloride: 103 mmol/L (ref 98–111)
Creatinine, Ser: 1.7 mg/dL — ABNORMAL HIGH (ref 0.44–1.00)
GFR, Estimated: 28 mL/min — ABNORMAL LOW (ref >60)
Glucose, Bld: 120 mg/dL — ABNORMAL HIGH (ref 70–99)
Potassium: 3.4 mmol/L — ABNORMAL LOW (ref 3.5–5.1)
Sodium: 134 mmol/L — ABNORMAL LOW (ref 135–145)

## 2021-10-18 MED ORDER — GABAPENTIN 100 MG PO CAPS
100.0000 mg | ORAL_CAPSULE | Freq: Every day | ORAL | Status: DC
  Administered 2021-10-18: 100 mg via ORAL
  Filled 2021-10-18: qty 1

## 2021-10-18 MED ORDER — IPRATROPIUM-ALBUTEROL 0.5-2.5 (3) MG/3ML IN SOLN
3.0000 mL | Freq: Three times a day (TID) | RESPIRATORY_TRACT | Status: DC
  Administered 2021-10-19: 3 mL via RESPIRATORY_TRACT
  Filled 2021-10-18: qty 3

## 2021-10-18 MED ORDER — IPRATROPIUM-ALBUTEROL 0.5-2.5 (3) MG/3ML IN SOLN
3.0000 mL | Freq: Four times a day (QID) | RESPIRATORY_TRACT | Status: DC
  Administered 2021-10-18 (×2): 3 mL via RESPIRATORY_TRACT
  Filled 2021-10-18 (×2): qty 3

## 2021-10-18 MED ORDER — APIXABAN 5 MG PO TABS
5.0000 mg | ORAL_TABLET | Freq: Two times a day (BID) | ORAL | Status: DC
Start: 2021-10-22 — End: 2021-10-19

## 2021-10-18 MED ORDER — ORAL CARE MOUTH RINSE: 15.0000 mL | OROMUCOSAL | Status: DC | PRN

## 2021-10-18 MED ORDER — POTASSIUM CHLORIDE CRYS ER 20 MEQ PO TBCR
40.0000 meq | EXTENDED_RELEASE_TABLET | Freq: Once | ORAL | Status: AC
  Administered 2021-10-18: 40 meq via ORAL
  Filled 2021-10-18: qty 2

## 2021-10-18 NOTE — Plan of Care (Signed)
  Problem: Acute Rehab PT Goals(only PT should resolve) Goal: Pt Will Go Supine/Side To Sit Outcome: Progressing Flowsheets (Taken 10/18/2021 1209) Pt will go Supine/Side to Sit:  with minimal assist  with min guard assist Goal: Patient Will Transfer Sit To/From Stand Outcome: Progressing Flowsheets (Taken 10/18/2021 1209) Patient will transfer sit to/from stand:  with min guard assist  with minimal assist Goal: Pt Will Transfer Bed To Chair/Chair To Bed Outcome: Progressing Flowsheets (Taken 10/18/2021 1209) Pt will Transfer Bed to Chair/Chair to Bed:  with mod assist  with min assist Goal: Pt Will Perform Standing Balance Or Pre-Gait Outcome: Progressing Flowsheets (Taken 10/18/2021 1209) Pt will perform standing balance or pre-gait:  with bilateral UE support  3- 5 min  with minimal assist  with min guard assist Goal: Pt Will Ambulate Outcome: Progressing Flowsheets (Taken 10/18/2021 1209) Pt will Ambulate:  25 feet  with min guard assist  with minimal assist  with rolling walker   12:10 PM, 10/18/21 Lestine Box, S/PT

## 2021-10-18 NOTE — Care Management Obs Status (Signed)
[facility] NOTIFICATION   Patient Details  Name: [name] [name] MRN: [id number] Date of Birth: Jul 26, 1927   Medicare Observation Status Notification Given:  Yes    Tommy Medal 10/18/2021, 3:37 PM

## 2021-10-18 NOTE — NC FL2 (Signed)
[place] MEDICAID FL2 LEVEL OF CARE SCREENING TOOL     IDENTIFICATION  Patient Name: [name] [name] Birthdate: March 01, 1928 Sex: female Admission Date (Current Location): 10/17/2021  [facility] and [place] Number:  [employer] and Address:  [facility] [address] [place]      Provider Number: [id number]  Attending Physician Name and Address:  Kathie Dike, MD  Relative Name and Phone Number:  Debbe Bales   - Daughter  -  [phone]    Current Level of Care: Hospital Recommended Level of Care: [place] Prior Approval Number:    Date Approved/Denied:   PASRR Number: [id number]  Discharge Plan: SNF    Current Diagnoses: Patient Active Problem List   Diagnosis Date Noted   Generalized weakness 10/17/2021   Pulmonary embolism ([place]) 10/17/2021   Hypokalemia 10/17/2021   Essential hypertension 01/11/2021   Leg edema 12/01/2020   Plantar flexed metatarsal bone of left foot 11/24/2020   Plantar flexed metatarsal bone of right foot 11/24/2020   Chronic sinusitis 07/17/2019   UTI (urinary tract infection) 10/15/2018   Right leg weakness 10/15/2018   Corns and callus 09/25/2018   Shingles 04/05/2018   HLD (hyperlipidemia) 08/05/2017   CKD (chronic kidney disease), stage III ([place]) 08/05/2017   Numbness 11/19/2014   Malignant neoplasm of thyroid gland ([place]) 03/28/2007   HYPOTHYROIDISM, POSTSURGICAL 03/28/2007   HYPOPARATHYROIDISM 03/28/2007   Allergic rhinitis 11/02/2006   OSTEOPENIA 11/02/2006    Orientation RESPIRATION BLADDER Height & Weight     Self, Time, Situation, Place  Normal Continent Weight: 57.4 kg Height:  '5\' 3"'$  (160 cm)  BEHAVIORAL SYMPTOMS/MOOD NEUROLOGICAL BOWEL NUTRITION STATUS      Continent Diet (See DC summary)  AMBULATORY STATUS COMMUNICATION OF NEEDS Skin   Extensive Assist Verbally Normal                       Personal Care Assistance Level of Assistance  Bathing, Feeding,  Dressing Bathing Assistance: Maximum assistance Feeding assistance: Independent Dressing Assistance: Limited assistance     Functional Limitations Info  Sight, Hearing, Speech Sight Info: Adequate Hearing Info: Impaired Speech Info: Adequate    SPECIAL CARE FACTORS FREQUENCY  PT (By licensed PT)     PT Frequency: 5 times a week              Contractures Contractures Info: Not present    Additional Factors Info  Code Status, Allergies Code Status Info: DNR Allergies Info: verapmil           Current Medications (10/18/2021):  This is the current hospital active medication list Current Facility-Administered Medications  Medication Dose Route Frequency Provider Last Rate Last Admin   acetaminophen (TYLENOL) tablet 650 mg  650 mg Oral Q6H PRN Emokpae, Ejiroghene E, MD       Or   acetaminophen (TYLENOL) suppository 650 mg  650 mg Rectal Q6H PRN Emokpae, Ejiroghene E, MD       apixaban (ELIQUIS) tablet 10 mg  10 mg Oral BID Emokpae, Ejiroghene E, MD   10 mg at 10/18/21 0910   [START ON 10/22/2021] apixaban (ELIQUIS) tablet 5 mg  5 mg Oral BID Kathie Dike, MD       cefTRIAXone (ROCEPHIN) 2 g in sodium chloride 0.9 % 100 mL IVPB  2 g Intravenous Q24H Emokpae, Ejiroghene E, MD       docusate sodium (COLACE) capsule 100 mg  100 mg Oral QHS Emokpae, Ejiroghene E,  MD   100 mg at 10/17/21 2049   doxycycline (VIBRAMYCIN) 100 mg in sodium chloride 0.9 % 250 mL IVPB  100 mg Intravenous Q12H Emokpae, Ejiroghene E, MD 125 mL/hr at 10/18/21 0920 100 mg at 10/18/21 0920   fluticasone (FLONASE) 50 MCG/ACT nasal spray 2 spray  2 spray Each Nare Daily Emokpae, Ejiroghene E, MD   2 spray at 10/18/21 0920   guaiFENesin-dextromethorphan (ROBITUSSIN DM) 100-10 MG/5ML syrup 10 mL  10 mL Oral Q8H Emokpae, Ejiroghene E, MD   10 mL at 10/18/21 0506   levothyroxine (SYNTHROID) tablet 112 mcg  112 mcg Oral Daily Emokpae, Ejiroghene E, MD   112 mcg at 10/18/21 0506   polyethylene glycol (MIRALAX /  GLYCOLAX) packet 17 g  17 g Oral Daily PRN Emokpae, Ejiroghene E, MD         Discharge Medications: Please see discharge summary for a list of discharge medications.  Relevant Imaging Results:  Relevant Lab Results:   Additional Information SS# [id number]  Boneta Lucks, RN

## 2021-10-18 NOTE — TOC Initial Note (Addendum)
Transition of Care [facility]) - Initial/Assessment Note    Patient Details  Name: [name] [name] MRN: [id number] Date of Birth: October 20, 1927  [facility] CM/SW Contact:    Boneta Lucks, RN Phone Number: 10/18/2021, 1:36 PM  Clinical Narrative:      Patient admitted with Generalized weakness. PT is recommending SNF. TOC spoke with daughter,Donna. She is agreeable, States her mother was at [place] in the past and wishes for her to go back there. FL2 completed and sent out.              Expected Discharge Plan: Skilled Nursing Facility Barriers to Discharge: Continued Medical Work up   Patient Goals and CMS Choice Patient states their goals for this hospitalization and ongoing recovery are:: agreeable to SNF CMS Medicare.gov Compare Post Acute Care list provided to:: Patient Represenative (must comment) Choice offered to / list presented to : Adult Children  Expected Discharge Plan and Services Expected Discharge Plan: [place] arrangements for the past 2 months: Single Family Home                     Prior Living Arrangements/Services Living arrangements for the past 2 months: Single Family Home Lives with:: Self Patient language and need for interpreter reviewed:: Yes        Need for Family Participation in Patient Care: Yes (Comment) Care giver support system in place?: Yes (comment)   Criminal Activity/Legal Involvement Pertinent to Current Situation/Hospitalization: No - Comment as needed  Activities of Daily Living Home Assistive Devices/Equipment: Bedside commode/3-in-1, Shower chair without back, Eyeglasses, Dentures (specify type), Walker (specify type) ADL Screening (condition at time of admission) Patient's cognitive ability adequate to safely complete daily activities?: No Is the patient deaf or have difficulty hearing?: Yes Does the patient have difficulty seeing, even when wearing glasses/contacts?: No Does  the patient have difficulty concentrating, remembering, or making decisions?: No Patient able to express need for assistance with ADLs?: Yes Does the patient have difficulty dressing or bathing?: Yes Independently performs ADLs?: No Communication: Independent Dressing (OT): Needs assistance Is this a change from baseline?: Pre-admission baseline Grooming: Needs assistance Is this a change from baseline?: Pre-admission baseline Feeding: Independent Bathing: Needs assistance Is this a change from baseline?: Pre-admission baseline Toileting: Independent, Needs assistance Is this a change from baseline?: Pre-admission baseline In/Out Bed: Needs assistance Is this a change from baseline?: Pre-admission baseline Walks in Home: Independent with device (comment) Does the patient have difficulty walking or climbing stairs?: Yes Weakness of Legs: Both Weakness of Arms/Hands: Both  Permission Sought/Granted   Emotional Assessment     Affect (typically observed): Accepting Orientation: : Oriented to Self, Oriented to Place, Oriented to  Time, Oriented to Situation Alcohol / Substance Use: Not Applicable Psych Involvement: No (comment)  Admission diagnosis:  Acute cystitis with hematuria [N30.01] Generalized weakness [R53.1] Patient Active Problem List   Diagnosis Date Noted   Generalized weakness 10/17/2021   Pulmonary embolism ([place]) 10/17/2021   Hypokalemia 10/17/2021   Essential hypertension 01/11/2021   Leg edema 12/01/2020   Plantar flexed metatarsal bone of left foot 11/24/2020   Plantar flexed metatarsal bone of right foot 11/24/2020   Chronic sinusitis 07/17/2019   UTI (urinary tract infection) 10/15/2018   Right leg weakness 10/15/2018   Corns and callus 09/25/2018   Shingles 04/05/2018   HLD (hyperlipidemia) 08/05/2017   CKD (chronic kidney disease), stage III ([place]) 08/05/2017   Numbness  11/19/2014   Malignant neoplasm of thyroid gland ([place]) 03/28/2007    HYPOTHYROIDISM, POSTSURGICAL 03/28/2007   HYPOPARATHYROIDISM 03/28/2007   Allergic rhinitis 11/02/2006   OSTEOPENIA 11/02/2006   PCP:  Vivi Barrack, MD Pharmacy:   [facility] [address] ([address]), [place] - [address] [place] ([place]) [place] [address] Phone: [phone] Fax: [phone]

## 2021-10-18 NOTE — Evaluation (Signed)
Physical Therapy Evaluation Patient Details Name: [name] [name] MRN: [id number] DOB: 01-26-1928 Today's Date: 10/18/2021  History of Present Illness  [name] [name] is a 88 y.o. female with medical history significant for hypertension, CKD 3.  Patient was brought to the ED with complaints of generalized weakness, unable to ambulate, and worsening cough.  History is mostly obtained from patient's granddaughter Johney Frame at bedside, who patient now lives with.  Patient is extremely hard of hearing.  Granddaughter reports patient has a chronic productive cough present for years, normally she would cough for about 2 hours and be done for the day, but she reports that since Friday, patient has been coughing continuously throughout the day.  No fevers no chills.  No difficulty breathing.  Over the past 3 days, patient has had progressive weakness, she was barely able to use her walker the past 2 days, but today she was unable to get up.  Patient otherwise has not complained of any pain.  No vomiting no loose stools.   Clinical Impression  Patient presents supine in bed with family members presents. Patient consents to PT evaluation. Patient is mod assist with bed mobility with HOB elevated. Increased time needed demonstrating slow labored movement. Decreased trunk control with needing assist to reach EOB. Patient is mod assist with transfers and use of RW. Fair coordination with balance deficits observed. Patient is able to initiate sit to stand but needs trunk assist by PT to reach standing position. Forward trunk lean observed impacting patient's ability to reach upright position to improve balancing ability. Limited to a few steps in room during ambulation with mod assist and use of RW. Moderate gait deviations but able to coordinate B LE appropriately. Patient primarily limited by fatigue and coughing. Patient tolerated sitting up in chair after therapy with nursing staff notified of mobility status.  Patient will benefit from continued skilled physical therapy in hospital and recommended venue below to increase strength, balance, endurance for safe ADLs and gait.      Recommendations for follow up therapy are one component of a multi-disciplinary discharge planning process, led by the attending physician.  Recommendations may be updated based on patient status, additional functional criteria and insurance authorization.  Follow Up Recommendations Skilled nursing-short term rehab (<3 hours/day) Can patient physically be transported by private vehicle: Yes    Assistance Recommended at Discharge Intermittent Supervision/Assistance  Patient can return home with the following  A little help with walking and/or transfers;Help with stairs or ramp for entrance;Assistance with cooking/housework;Assist for transportation    Equipment Recommendations None recommended by PT  Recommendations for Other Services       Functional Status Assessment Patient has had a recent decline in their functional status and demonstrates the ability to make significant improvements in function in a reasonable and predictable amount of time.     Precautions / Restrictions Precautions Precautions: Fall Restrictions Weight Bearing Restrictions: No      Mobility  Bed Mobility Overal bed mobility: Needs Assistance Bed Mobility: Supine to Sit     Supine to sit: Mod assist     General bed mobility comments: Mod assist with supine to sit. Increased time needed along with slow labored movement demonstrated. Decreased trunk control with needing assist to reach EOB. LE assist provided.    Transfers Overall transfer level: Needs assistance Equipment used: Rolling walker (2 wheels) Transfers: Sit to/from Stand, Bed to chair/wheelchair/BSC Sit to Stand: Mod assist   Step pivot transfers: Mod assist  General transfer comment: Mod assist with transfers and RW. Fair coordination with balance deficits  observed. Able to initiate STS but needs trunk assist to reach upright position. Forward trunk lean impacts patients ability to reach upright position.    Ambulation/Gait Ambulation/Gait assistance: Mod assist Gait Distance (Feet): 5 Feet Assistive device: Rolling walker (2 wheels) Gait Pattern/deviations: Step-to pattern, Decreased step length - left, Decreased step length - right, Decreased stride length, Decreased weight shift to left, Decreased weight shift to right, Narrow base of support, Trunk flexed Gait velocity: decreased     General Gait Details: Limited to a few steps in room with mod assist and use of RW. Moderate gait deviaitions with primarily being limited by fatigue and coughing.  Stairs            Wheelchair Mobility    Modified Rankin (Stroke Patients Only)       Balance Overall balance assessment: Needs assistance Sitting-balance support: Bilateral upper extremity supported, Feet supported Sitting balance-Leahy Scale: Fair Sitting balance - Comments: Seated EOB with UE support   Standing balance support: Bilateral upper extremity supported, During functional activity, Reliant on assistive device for balance Standing balance-Leahy Scale: Fair Standing balance comment: Poor/fair with RW                             Pertinent Vitals/Pain Pain Assessment Pain Assessment: No/denies pain    Home Living Family/patient expects to be discharged to:: Private residence Living Arrangements: Children Available Help at Discharge: Family;Available PRN/intermittently Type of Home: Mobile home Home Access: Stairs to enter Entrance Stairs-Rails: Right;Left;Can reach both Entrance Stairs-Number of Steps: 5   Home Layout: One level Home Equipment: Conservation officer, nature (2 wheels);Shower seat;Cane - quad;Wheelchair - manual      Prior Function Prior Level of Function : Needs assist       Physical Assist : ADLs (physical)   ADLs (physical):  IADLs Mobility Comments: Family reports patient was able to ambulate independently at home with RW. Not a community ambulator and does not drive. ADLs Comments: Family reports patient needs assist with ADL's and functional tasks.     Hand Dominance   Dominant Hand: Right    Extremity/Trunk Assessment   Upper Extremity Assessment Upper Extremity Assessment: Generalized weakness    Lower Extremity Assessment Lower Extremity Assessment: Generalized weakness    Cervical / Trunk Assessment Cervical / Trunk Assessment: Normal  Communication   Communication: HOH  Cognition Arousal/Alertness: Awake/alert Behavior During Therapy: WFL for tasks assessed/performed Overall Cognitive Status: Within Functional Limits for tasks assessed                                          General Comments      Exercises     Assessment/Plan    PT Assessment Patient needs continued PT services;All further PT needs can be met in the next venue of care  PT Problem List Decreased strength;Decreased activity tolerance;Decreased balance;Decreased mobility;Decreased coordination       PT Treatment Interventions DME instruction;Gait training;Functional mobility training;Therapeutic activities;Therapeutic exercise;Balance training    PT Goals (Current goals can be found in the Care Plan section)  Acute Rehab PT Goals Patient Stated Goal: return home PT Goal Formulation: With patient Time For Goal Achievement: 11/01/21 Potential to Achieve Goals: Fair    Frequency Min 3X/week     Co-evaluation  AM-PAC PT "6 Clicks" Mobility  Outcome Measure Help needed turning from your back to your side while in a flat bed without using bedrails?: A Little Help needed moving from lying on your back to sitting on the side of a flat bed without using bedrails?: A Lot Help needed moving to and from a bed to a chair (including a wheelchair)?: A Lot Help needed standing up from  a chair using your arms (e.g., wheelchair or bedside chair)?: A Lot Help needed to walk in hospital room?: A Lot Help needed climbing 3-5 steps with a railing? : A Lot 6 Click Score: 13    End of Session   Activity Tolerance: Patient tolerated treatment well;No increased pain;Patient limited by fatigue Patient left: in chair;with call bell/phone within reach;with chair alarm set Nurse Communication: Mobility status PT Visit Diagnosis: Unsteadiness on feet (R26.81);Other abnormalities of gait and mobility (R26.89);Muscle weakness (generalized) (M62.81)    Time: 4332-9518 PT Time Calculation (min) (ACUTE ONLY): 29 min   Charges:   PT Evaluation $PT Eval Moderate Complexity: 1 Mod PT Treatments $Therapeutic Activity: 23-37 mins        12:08 PM, 10/18/21 Lestine Box, S/PT

## 2021-10-18 NOTE — Progress Notes (Signed)
PROGRESS NOTE    [name] [name]  KCL:[id number] DOB: 1927-12-29 DOA: 10/17/2021 PCP: Vivi Barrack, MD    Brief Narrative:  88 y/o female who lives at home, brought to ED with generalized weakness. Found to have possible UTI. Cough and possible pneumonia. She is on antibiotics. SLP eval ordered. Seen by PT with recommendation for SNF   Assessment & Plan:   Principal Problem:   Generalized weakness Active Problems:   CKD (chronic kidney disease), stage III (HCC)   UTI (urinary tract infection)   Essential hypertension   Pulmonary embolism (HCC)   Hypokalemia   Generalized weakness -likely deconditioning -possible UTI -dehydration -seen by PT with recs for SNF  UTI -started on rocephin -follow up culture  Cough -possible PNA on chest xray -continue antibiotics -SLP eval -bronchodilators  Recent pulmonary embolism -diagnosed 10/14/21 -she is on eliquis  Hypokalemia -replace  Hypothyroidism -continue synthroid  CKD stage 3b/4 -baseline creatinine 1.4-1.7 -creatinine currently near baseline -continue to follow  Anemia of chronic kidney disease -hemoglobin near baseline -continue to follow   DVT prophylaxis:  apixaban (ELIQUIS) tablet 10 mg  apixaban (ELIQUIS) tablet 5 mg  Code Status: DNR Family Communication: Discussed with son and daughter at the bedside Disposition Plan: Status is: Inpatient Remains inpatient appropriate because: needs SNF placement     Consultants:    Procedures:    Antimicrobials:  Ceftriaxone 7/11 > Doxycycline 7/10>   Subjective: She is feeling better since admission.  Still feels weak.  She has a hacking cough.  Objective: Vitals:   10/18/21 0115 10/18/21 0541 10/18/21 1407 10/18/21 1500  BP: 108/67 (!) 98/53 106/62   Pulse: 85 69 68   Resp: '16 20 19   '$ Temp: 98 F (36.7 C) 98 F (36.7 C) 98.2 F (36.8 C)   TempSrc:   Oral   SpO2: 91% 91% 91% 93%  Weight:      Height:        Intake/Output Summary  (Last 24 hours) at 10/18/2021 1916 Last data filed at 10/18/2021 1856 Gross per 24 hour  Intake 1673.09 ml  Output --  Net 1673.09 ml   Filed Weights   10/17/21 0946  Weight: 57.4 kg    Examination:  General exam: Appears calm and comfortable  Respiratory system: Clear to auscultation. Respiratory effort normal. Cardiovascular system: S1 & S2 heard, RRR. No JVD, murmurs, rubs, gallops or clicks. No pedal edema. Gastrointestinal system: Abdomen is nondistended, soft and nontender. No organomegaly or masses felt. Normal bowel sounds heard. Central nervous system: Alert and oriented. No focal neurological deficits. Extremities: Symmetric 5 x 5 power. Skin: No rashes, lesions or ulcers Psychiatry: Judgement and insight appear normal. Mood & affect appropriate.     Data Reviewed: I have personally reviewed following labs and imaging studies  CBC: Recent Labs  Lab 10/14/21 1859 10/17/21 0956 10/18/21 0524  WBC 11.8* 11.6* 8.8  NEUTROABS 10.0* 9.7*  --   HGB 10.3* 10.6* 9.5*  HCT 32.1* 32.4* 30.2*  MCV 89.2 88.0 89.3  PLT 281 298 494   Basic Metabolic Panel: Recent Labs  Lab 10/14/21 1859 10/17/21 0956 10/17/21 1803 10/18/21 0524  NA 136 133*  --  134*  K 3.5 3.2*  --  3.4*  CL 97* 93*  --  103  CO2 27 28  --  24  GLUCOSE 134* 123*  --  120*  BUN 34* 39*  --  38*  CREATININE 1.58* 1.72*  --  1.70*  CALCIUM 7.5* 7.2*  --  6.5*  MG  --   --  1.9  --    GFR: Estimated Creatinine Clearance: 17.8 mL/min (A) (by C-G formula based on SCr of 1.7 mg/dL (H)). Liver Function Tests: Recent Labs  Lab 10/17/21 0956  AST 21  ALT 10  ALKPHOS 54  BILITOT 1.4*  PROT 8.4*  ALBUMIN 3.5   No results for input(s): "LIPASE", "AMYLASE" in the last 168 hours. No results for input(s): "AMMONIA" in the last 168 hours. Coagulation Profile: No results for input(s): "INR", "PROTIME" in the last 168 hours. Cardiac Enzymes: No results for input(s): "CKTOTAL", "CKMB", "CKMBINDEX",  "TROPONINI" in the last 168 hours. BNP (last 3 results) No results for input(s): "PROBNP" in the last 8760 hours. HbA1C: No results for input(s): "HGBA1C" in the last 72 hours. CBG: No results for input(s): "GLUCAP" in the last 168 hours. Lipid Profile: No results for input(s): "CHOL", "HDL", "LDLCALC", "TRIG", "CHOLHDL", "LDLDIRECT" in the last 72 hours. Thyroid Function Tests: No results for input(s): "TSH", "T4TOTAL", "FREET4", "T3FREE", "THYROIDAB" in the last 72 hours. Anemia Panel: No results for input(s): "VITAMINB12", "FOLATE", "FERRITIN", "TIBC", "IRON", "RETICCTPCT" in the last 72 hours. Sepsis Labs: Recent Labs  Lab 10/17/21 0956  LATICACIDVEN 1.2    Recent Results (from the past 240 hour(s))  Urine Culture     Status: Abnormal (Preliminary result)   Collection Time: 10/17/21  9:56 AM   Specimen: Urine, Clean Catch  Result Value Ref Range Status   Specimen Description   Final    URINE, CLEAN CATCH Performed at [facility], [address] [place], [place] [address]    Special Requests   Final    NONE Performed at [facility], [address] [place], [place] [address]    Culture (A)  Final    >=100,000 COLONIES/mL GRAM NEGATIVE RODS IDENTIFICATION AND SUSCEPTIBILITIES TO FOLLOW Performed at [facility], [place] [address] [place], [place] [address]    Report Status PENDING  Incomplete         Radiology Studies: DG Chest Port 1 View  Result Date: 10/17/2021 CLINICAL DATA:  Productive, weakness EXAM: PORTABLE CHEST 1 VIEW COMPARISON:  10/14/2021 FINDINGS: Stable cardiomediastinal contours. Aortic atherosclerosis. Subtly increased interstitial markings within the perihilar and bibasilar regions. No focal consolidation. No pleural effusion or pneumothorax. Advanced degenerative changes of the shoulders. Bones are demineralized. IMPRESSION: Subtly increased interstitial markings within the perihilar and bibasilar regions may reflect mild edema versus  developing atypical/viral infection. Electronically Signed   By: Davina Poke D.O.   On: 10/17/2021 11:00        Scheduled Meds:  apixaban  10 mg Oral BID   [START ON 10/22/2021] apixaban  5 mg Oral BID   docusate sodium  100 mg Oral QHS   fluticasone  2 spray Each Nare Daily   gabapentin  100 mg Oral QHS   guaiFENesin-dextromethorphan  10 mL Oral Q8H   ipratropium-albuterol  3 mL Nebulization Q6H   levothyroxine  112 mcg Oral Daily   Continuous Infusions:  cefTRIAXone (ROCEPHIN)  IV 2 g (10/18/21 1411)   doxycycline (VIBRAMYCIN) IV 100 mg (10/18/21 0920)     LOS: 0 days    Time spent: 50mns    JKathie Dike MD [facility]   If 7PM-7AM, please contact night-coverage [email or web address]  10/18/2021, 7:16 PM

## 2021-10-18 NOTE — Progress Notes (Addendum)
Patient transferred to bedside commode multiple times prior to midnight. Patient does continue to cough up yellow sputum. She slept the rest of the night, until time for morning medications and vitals.

## 2021-10-19 DIAGNOSIS — R531 Weakness: Secondary | ICD-10-CM | POA: Diagnosis not present

## 2021-10-19 DIAGNOSIS — I10 Essential (primary) hypertension: Secondary | ICD-10-CM | POA: Diagnosis not present

## 2021-10-19 DIAGNOSIS — B9620 Unspecified Escherichia coli [E. coli] as the cause of diseases classified elsewhere: Secondary | ICD-10-CM | POA: Diagnosis not present

## 2021-10-19 DIAGNOSIS — N183 Chronic kidney disease, stage 3 unspecified: Secondary | ICD-10-CM | POA: Diagnosis not present

## 2021-10-19 DIAGNOSIS — E785 Hyperlipidemia, unspecified: Secondary | ICD-10-CM | POA: Diagnosis not present

## 2021-10-19 DIAGNOSIS — Z7401 Bed confinement status: Secondary | ICD-10-CM | POA: Diagnosis not present

## 2021-10-19 DIAGNOSIS — G629 Polyneuropathy, unspecified: Secondary | ICD-10-CM | POA: Diagnosis not present

## 2021-10-19 DIAGNOSIS — I129 Hypertensive chronic kidney disease with stage 1 through stage 4 chronic kidney disease, or unspecified chronic kidney disease: Secondary | ICD-10-CM | POA: Diagnosis not present

## 2021-10-19 DIAGNOSIS — Z7901 Long term (current) use of anticoagulants: Secondary | ICD-10-CM | POA: Diagnosis not present

## 2021-10-19 DIAGNOSIS — Z9009 Acquired absence of other part of head and neck: Secondary | ICD-10-CM | POA: Diagnosis not present

## 2021-10-19 DIAGNOSIS — N1831 Chronic kidney disease, stage 3a: Secondary | ICD-10-CM | POA: Diagnosis not present

## 2021-10-19 DIAGNOSIS — R0602 Shortness of breath: Secondary | ICD-10-CM | POA: Diagnosis not present

## 2021-10-19 DIAGNOSIS — E876 Hypokalemia: Secondary | ICD-10-CM | POA: Diagnosis not present

## 2021-10-19 DIAGNOSIS — E039 Hypothyroidism, unspecified: Secondary | ICD-10-CM | POA: Diagnosis not present

## 2021-10-19 DIAGNOSIS — B029 Zoster without complications: Secondary | ICD-10-CM | POA: Diagnosis not present

## 2021-10-19 DIAGNOSIS — R059 Cough, unspecified: Secondary | ICD-10-CM | POA: Diagnosis not present

## 2021-10-19 DIAGNOSIS — Z79899 Other long term (current) drug therapy: Secondary | ICD-10-CM | POA: Diagnosis not present

## 2021-10-19 DIAGNOSIS — Z85850 Personal history of malignant neoplasm of thyroid: Secondary | ICD-10-CM | POA: Diagnosis not present

## 2021-10-19 DIAGNOSIS — I2699 Other pulmonary embolism without acute cor pulmonale: Secondary | ICD-10-CM | POA: Diagnosis not present

## 2021-10-19 DIAGNOSIS — R29898 Other symptoms and signs involving the musculoskeletal system: Secondary | ICD-10-CM | POA: Diagnosis not present

## 2021-10-19 DIAGNOSIS — J309 Allergic rhinitis, unspecified: Secondary | ICD-10-CM | POA: Diagnosis not present

## 2021-10-19 DIAGNOSIS — N390 Urinary tract infection, site not specified: Secondary | ICD-10-CM | POA: Diagnosis not present

## 2021-10-19 DIAGNOSIS — K5900 Constipation, unspecified: Secondary | ICD-10-CM | POA: Diagnosis not present

## 2021-10-19 LAB — URINE CULTURE: Culture: >=100000 — AB

## 2021-10-19 LAB — CBC
HCT: 30.7 % — ABNORMAL LOW (ref 36.0–46.0)
Hemoglobin: 9.6 g/dL — ABNORMAL LOW (ref 12.0–15.0)
MCH: 28.4 pg (ref 26.0–34.0)
MCHC: 31.3 g/dL (ref 30.0–36.0)
MCV: 90.8 fL (ref 80.0–100.0)
Platelets: 269 10*3/uL (ref 150–400)
RBC: 3.38 MIL/uL — ABNORMAL LOW (ref 3.87–5.11)
RDW: 13.2 % (ref 11.5–15.5)
WBC: 8.5 10*3/uL (ref 4.0–10.5)
nRBC: 0 % (ref 0.0–0.2)

## 2021-10-19 LAB — BASIC METABOLIC PANEL
Anion gap: 9 (ref 5–15)
BUN: 31 mg/dL — ABNORMAL HIGH (ref 8–23)
CO2: 26 mmol/L (ref 22–32)
Calcium: 6.6 mg/dL — ABNORMAL LOW (ref 8.9–10.3)
Chloride: 103 mmol/L (ref 98–111)
Creatinine, Ser: 1.37 mg/dL — ABNORMAL HIGH (ref 0.44–1.00)
GFR, Estimated: 36 mL/min — ABNORMAL LOW (ref >60)
Glucose, Bld: 122 mg/dL — ABNORMAL HIGH (ref 70–99)
Potassium: 3.3 mmol/L — ABNORMAL LOW (ref 3.5–5.1)
Sodium: 138 mmol/L (ref 135–145)

## 2021-10-19 MED ORDER — TRAZODONE HCL 50 MG PO TABS: 50.0000 mg | ORAL_TABLET | Freq: Every evening | ORAL | Status: DC | PRN

## 2021-10-19 MED ORDER — DOXYCYCLINE HYCLATE 100 MG PO TABS
100.0000 mg | ORAL_TABLET | Freq: Two times a day (BID) | ORAL | Status: DC
  Administered 2021-10-19: 100 mg via ORAL
  Filled 2021-10-19: qty 1

## 2021-10-19 MED ORDER — CEPHALEXIN 500 MG PO CAPS: 500.0000 mg | ORAL_CAPSULE | Freq: Three times a day (TID) | ORAL | 0 refills | Status: AC

## 2021-10-19 MED ORDER — POTASSIUM CHLORIDE CRYS ER 20 MEQ PO TBCR
40.0000 meq | EXTENDED_RELEASE_TABLET | Freq: Once | ORAL | Status: AC
  Administered 2021-10-19: 40 meq via ORAL
  Filled 2021-10-19: qty 2

## 2021-10-19 MED ORDER — ALBUTEROL SULFATE HFA 108 (90 BASE) MCG/ACT IN AERS: 2.0000 | INHALATION_SPRAY | Freq: Four times a day (QID) | RESPIRATORY_TRACT | 2 refills | Status: DC | PRN

## 2021-10-19 MED ORDER — HYDRALAZINE HCL 20 MG/ML IJ SOLN: 10.0000 mg | INTRAMUSCULAR | Status: DC | PRN

## 2021-10-19 MED ORDER — IPRATROPIUM-ALBUTEROL 0.5-2.5 (3) MG/3ML IN SOLN: 3.0000 mL | Freq: Two times a day (BID) | RESPIRATORY_TRACT | Status: DC

## 2021-10-19 MED ORDER — DOXYCYCLINE HYCLATE 100 MG PO TABS: 100.0000 mg | ORAL_TABLET | Freq: Two times a day (BID) | ORAL | 0 refills | Status: AC

## 2021-10-19 MED ORDER — CEPHALEXIN 500 MG PO CAPS
500.0000 mg | ORAL_CAPSULE | Freq: Three times a day (TID) | ORAL | Status: DC
  Administered 2021-10-19: 500 mg via ORAL
  Filled 2021-10-19: qty 1

## 2021-10-19 MED ORDER — BENZONATATE 100 MG PO CAPS
100.0000 mg | ORAL_CAPSULE | Freq: Three times a day (TID) | ORAL | Status: DC
Start: 2021-10-19 — End: 2021-10-19
  Administered 2021-10-19: 100 mg via ORAL
  Filled 2021-10-19: qty 1

## 2021-10-19 MED ORDER — GABAPENTIN 100 MG PO CAPS: 100.0000 mg | ORAL_CAPSULE | Freq: Every day | ORAL | Status: DC

## 2021-10-19 MED ORDER — METOPROLOL TARTRATE 5 MG/5ML IV SOLN: 5.0000 mg | INTRAVENOUS | Status: DC | PRN

## 2021-10-19 NOTE — TOC Transition Note (Signed)
Transition of Care [facility]) - CM/SW Discharge Note   Patient Details  Name: [name] [name] MRN: [id number] Date of Birth: 08-19-1927  Transition of Care [facility]) CM/SW Contact:  Boneta Lucks, RN Phone Number: 10/19/2021, 4:02 PM   Clinical Narrative:   Daughter accepted bed at [facility], [place] started INS Auth, HTA approved SNF # [id number] good for 7 days and EMS # [id number] good for 90 days. [name] at [facility], Sent DC summary earlier. RN to call report. TOC calling EMS.     Final next level of care: Skilled Nursing Facility Barriers to Discharge: Barriers Resolved   Patient Goals and CMS Choice Patient states their goals for this hospitalization and ongoing recovery are:: agreeable to SNF CMS Medicare.gov Compare Post Acute Care list provided to:: Patient Represenative (must comment) Choice offered to / list presented to : Adult Children  Discharge Placement              Patient chooses bed at:  ([facility]) Patient to be transferred to facility by: EMS Name of family member notified: [name] Patient and family notified of of transfer: 10/19/21  Discharge Plan and [place]   Readmission Risk Interventions    10/19/2021    4:01 PM  Readmission Risk Prevention Plan  Transportation Screening Complete  PCP or Specialist Appt within 3-5 Days Complete  HRI or [place] Complete  Social Work Consult for [place] Planning/Counseling Complete  Palliative Care Screening Not Complete  Medication Review Press photographer) Complete

## 2021-10-19 NOTE — Evaluation (Signed)
Clinical/Bedside Swallow Evaluation Patient Details  Name: RICHELL CORKER MRN: [id number] Date of Birth: 07-23-1927  Today's Date: 10/19/2021 Time: SLP Start Time (ACUTE ONLY): 1225 SLP Stop Time (ACUTE ONLY): 1254 SLP Time Calculation (min) (ACUTE ONLY): 29 min  Past Medical History:  Past Medical History:  Diagnosis Date   ALLERGIC RHINITIS 11/02/2006   CKD (chronic kidney disease), stage III ([place])    HYPERLIPIDEMIA 11/04/2006   HYPOTHYROIDISM, POSTSURGICAL 03/28/2007   Malignant neoplasm of thyroid gland ([place]) 03/28/2007   5/07: righte lobectomy--path shows 3 cm follicular ca 5/00: completion left lobectomy, no more tumor seen 09/07: thyroglobulin 0.2 9ab neg) 11/07: i-131 rx, 101 [facility] 6/08: thyroglogulin 0.2 (ab neg) , hypothyroid body scan neg (clinically tolerated hypothyroidism poorly) 06/09: tg undetectable (ab=1.5, normal) 12/10: tg undetectable (ab neg) 6/11: tg undectable (ab neg)   OSTEOPENIA 11/02/2006   Past Surgical History:  Past Surgical History:  Procedure Laterality Date   ABDOMINAL HYSTERECTOMY     CERVICAL POLYPECTOMY     OPEN REDUCTION INTERNAL FIXATION (ORIF) DISTAL RADIAL FRACTURE Left 01/14/2014   Procedure: OPEN REDUCTION INTERNAL FIXATION DISTAL RADIAL FRACTURE;  Surgeon: Linna Hoff, MD;  Location: [facility];  Service: Orthopedics;  Laterality: Left;   HPI:  88 y/o female who lives at home, brought to ED with generalized weakness. Found to have possible UTI. Cough and possible pneumonia. She is on antibiotics. SLP eval ordered. Seen by PT with recommendation for SNF. Chest xray shows: Subtly increased interstitial markings within the perihilar and  bibasilar regions may reflect mild edema versus developing  atypical/viral infection.    Assessment / Plan / Recommendation  Clinical Impression  Clinical swallow evaluation completed at bedside. Pt reports extensive coughing recently, but also history of coughing for years. Oral motor examination is WNL in setting of  edentulous status. Pt has U/L dentures, however the upper denture is missing a few teeth and are loose fitting. Pt reports that she still wears them for meals. Pt with harsh/hoarse vocal quality, likely due to recent coughing episodes. Pt assessed with ice chips, thin water via cup/straw, puree, mech soft, and regular textures. Pt without overt signs or symptoms of reduced airway protection/aspiration and no reports of globus. She does exhibit impaired mastication likely due to ill fitting upper dentures and results in prolonged oral transit and min residuals along her dentures (she is aware). Pt with improved efficiency with mechanical soft textures. Recommend D3/mech soft in hospital setting and ok for self regulated regular textures once at home if she has assist for making meals/cutting meats and ok for thin liquids, po medications whole with water with standard aspiration and reflux precautions. Pt encouraged to sip on water to replace some of her volitional coughing episodes. No further SLP services indicated at this time. SLP will sign off. Above to RN. SLP Visit Diagnosis: Dysphagia, unspecified (R13.10)    Aspiration Risk  Mild aspiration risk    Diet Recommendation Dysphagia 3 (Mech soft);Thin liquid;Regular   Liquid Administration via: Cup;Straw Medication Administration: Whole meds with liquid Supervision: Patient able to self feed (needs set up assist) Compensations: Slow rate;Small sips/bites;Lingual sweep for clearance of pocketing Postural Changes: Seated upright at 90 degrees;Remain upright for at least 30 minutes after po intake    Other  Recommendations Oral Care Recommendations: Oral care BID Other Recommendations: Clarify dietary restrictions    Recommendations for follow up therapy are one component of a multi-disciplinary discharge planning process, led by the attending physician.  Recommendations may be updated  based on patient status, additional functional criteria and  insurance authorization.  Follow up Recommendations No SLP follow up      Assistance Recommended at Discharge Set up Supervision/Assistance  Functional Status Assessment Patient has had a recent decline in their functional status and demonstrates the ability to make significant improvements in function in a reasonable and predictable amount of time.  Frequency and Duration            Prognosis Prognosis for Safe Diet Advancement: Good      Swallow Study   General Date of Onset: 10/17/21 HPI: 88 y/o female who lives at home, brought to ED with generalized weakness. Found to have possible UTI. Cough and possible pneumonia. She is on antibiotics. SLP eval ordered. Seen by PT with recommendation for SNF. Chest xray shows: Subtly increased interstitial markings within the perihilar and  bibasilar regions may reflect mild edema versus developing  atypical/viral infection. Type of Study: Bedside Swallow Evaluation Previous Swallow Assessment: N/A Diet Prior to this Study: Regular;Thin liquids Temperature Spikes Noted: No Respiratory Status: Room air History of Recent Intubation: No Behavior/Cognition: Alert;Cooperative;Pleasant mood Oral Cavity Assessment: Within Functional Limits Oral Care Completed by SLP: Recent completion by staff Oral Cavity - Dentition: Dentures, top;Dentures, bottom (loose fitting upper dentures) Vision: Functional for self-feeding Self-Feeding Abilities: Able to feed self;Needs set up Patient Positioning: Upright in bed Baseline Vocal Quality: Normal;Hoarse Volitional Cough: Strong Volitional Swallow: Able to elicit    Oral/Motor/Sensory Function Overall Oral Motor/Sensory Function: Within functional limits   Ice Chips Ice chips: Within functional limits Presentation: Spoon   Thin Liquid Thin Liquid: Within functional limits Presentation: Cup;Self Fed;Straw    Nectar Thick Nectar Thick Liquid: Not tested   Honey Thick Honey Thick Liquid: Not tested    Puree Puree: Within functional limits Presentation: Spoon;Self Fed   Solid     Solid: Impaired Presentation: Self Fed Oral Phase Impairments: Impaired mastication Oral Phase Functional Implications: Prolonged oral transit     Thank you,  Genene Churn, [place]  Ramey [place] 10/19/2021,1:03 PM

## 2021-10-19 NOTE — Progress Notes (Signed)
Patient transported to [name] [name] reported called and given to [place].[facility] to transport patient to awaiting facility.

## 2021-10-19 NOTE — Progress Notes (Signed)
Report called to [facility] at [phone] Report given to [place] .   PT will be going to room 205

## 2021-10-19 NOTE — Progress Notes (Signed)
When patient is awake she continues to cough up sputum it is yellow/white and thick. Patient took medications with no issue. She also slept the whole night only waking when it was time for vitals and medication.

## 2021-10-19 NOTE — Discharge Summary (Signed)
Physician Discharge Summary  [name] [name] QMG:[id number] DOB: [birth year] DOA: 10/17/2021  PCP: Vivi Barrack, MD  Admit date: 10/17/2021 Discharge date: 10/19/2021  Admitted From: Home Disposition:  SNF  Recommendations for Outpatient Follow-up:  Follow up with PCP in 1-2 weeks Please obtain BMP/CBC in one week your next doctors visit.  Keflex and doxycycline for 3 more days to complete 5-day course Gabapentin bedtime 100 mg Albuterol as needed  Discharge Condition: Stable CODE STATUS: DNR Diet Recommendation Dysphagia 3 (Mech soft);Thin liquid;Regular    Liquid Administration via: Cup;Straw Medication Administration: Whole meds with liquid Supervision: Patient able to self feed (needs set up assist) Compensations: Slow rate;Small sips/bites;Lingual sweep for clearance of pocketing Postural Changes: Seated upright at 90 degrees;Remain upright for at least 30 minutes after po intake        Brief/Interim Summary:  88 y/o female who lives at home with history of recent pulmonary embolism on Eliquis, hypothyroidism, CKD stage IIIb, anemia of chronic disease comes to the hospital complains of generalized weakness.  Upon admission she was noted to have urinary tract infection with concerns of possible pneumonia/bronchitis.  Over the course of couple of days her symptoms improved.  Urine culture grew E. coli which was pansensitive.  There was concerns of possible aspiration therefore speech and swallow evaluation was performed and the recommendations are stated as above.  Today patient is medically stable to be discharged.  PT has recommended SNF therefore arrangements made by TOC.   Assessment & Plan:   Principal Problem:   Generalized weakness Active Problems:   CKD (chronic kidney disease), stage III (HCC)   UTI (urinary tract infection)   Essential hypertension   Pulmonary embolism (HCC)   Hypokalemia     Generalized weakness -Improved with treatment of UTI.  PT  recommends SNF therefore arrangements being made.   UTI secondary to E. coli -Transition to oral Keflex   Cough Chronic aspiration -Patient seen by speech and swallow.  Recommendations as mentioned above.   Recent pulmonary embolism -diagnosed 10/14/21.  Continue Eliquis   Hypothyroidism -Continue Synthroid   CKD stage 3b -Creatinine currently at baseline of 1.4.   Anemia of chronic kidney disease -Hemoglobin around baseline of 9.5.   Discharge Diagnoses:  Principal Problem:   Generalized weakness Active Problems:   CKD (chronic kidney disease), stage III (HCC)   UTI (urinary tract infection)   Essential hypertension   Pulmonary embolism (HCC)   Hypokalemia      Consultations: Speech and swallow  Subjective: Seen and examined at bedside.  Patient is very hard of hearing.  No complaints otherwise.  Tolerating breakfast.  Discharge Exam: Vitals:   10/19/21 0527 10/19/21 0735  BP: 121/72   Pulse: 76   Resp: 19   Temp: 99 F (37.2 C)   SpO2: 98% 98%   Vitals:   10/18/21 2118 10/18/21 2300 10/19/21 0527 10/19/21 0735  BP: (!) 115/59  121/72   Pulse: 78  76   Resp: 20  19   Temp:  (!) 97.3 F (36.3 C) 99 F (37.2 C)   TempSrc:  Oral    SpO2: 99%  98% 98%  Weight:      Height:        General: Pt is alert, awake, not in acute distress, very hard of hearing.  Elderly frail. Cardiovascular: RRR, S1/S2 +, no rubs, no gallops Respiratory: CTA bilaterally, no wheezing, no rhonchi Abdominal: Soft, NT, ND, bowel sounds + Extremities: no edema, no cyanosis  Discharge  Instructions   Allergies as of 10/19/2021       Reactions   Verapamil Other (See Comments)   edema         Medication List     TAKE these medications    albuterol 108 (90 Base) MCG/ACT inhaler Commonly known as: VENTOLIN HFA Inhale 2 puffs into the lungs every 6 (six) hours as needed for wheezing or shortness of breath.   Apixaban Starter Pack ('10mg'$  and '5mg'$ ) Commonly known as:  ELIQUIS STARTER PACK Take as directed on package: start with two-'5mg'$  tablets twice daily for 7 days. On day 8, switch to one-'5mg'$  tablet twice daily. What changed:  how much to take how to take this when to take this   benzonatate 200 MG capsule Commonly known as: TESSALON TAKE 1 CAPSULE BY MOUTH TWICE DAILY AS NEEDED FOR COUGH   cephALEXin 500 MG capsule Commonly known as: KEFLEX Take 1 capsule (500 mg total) by mouth every 8 (eight) hours for 3 days.   docusate sodium 100 MG capsule Commonly known as: COLACE Take 100 mg by mouth at bedtime.   doxycycline 100 MG tablet Commonly known as: VIBRA-TABS Take 1 tablet (100 mg total) by mouth every 12 (twelve) hours for 3 days.   fluticasone 50 MCG/ACT nasal spray Commonly known as: FLONASE Place 2 sprays into both nostrils daily.   furosemide 40 MG tablet Commonly known as: LASIX Take 1 tablet (40 mg total) by mouth 2 (two) times daily.   gabapentin 100 MG capsule Commonly known as: NEURONTIN Take 1 capsule (100 mg total) by mouth at bedtime.   guaiFENesin 600 MG 12 hr tablet Commonly known as: Mucinex Take 1 tablet (600 mg total) by mouth 2 (two) times daily.   levothyroxine 112 MCG tablet Commonly known as: SYNTHROID Take 1 tablet (112 mcg total) by mouth daily.        Contact information for follow-up providers     Vivi Barrack, MD Follow up in 1 week(s).   Specialty: Family Medicine Contact information: [place] [address] [phone]         Jerline Pain, MD .   Specialty: Cardiology Contact information: [phone] [address] [phone]              Contact information for after-discharge care     Destination     [facility] Preferred SNF .   Service: Skilled Nursing Contact information: [place] [phone]                    Allergies  Allergen Reactions    Verapamil Other (See Comments)    edema     You were cared for by a hospitalist during your hospital stay. If you have any questions about your discharge medications or the care you received while you were in the hospital after you are discharged, you can call the unit and asked to speak with the hospitalist on call if the hospitalist that took care of you is not available. Once you are discharged, your primary care physician will handle any further medical issues. Please note that no refills for any discharge medications will be authorized once you are discharged, as it is imperative that you return to your primary care physician (or establish a relationship with a primary care physician if you do not have one) for your aftercare needs so that they can reassess your need  for medications and monitor your lab values.   Procedures/Studies: DG Chest Port 1 View  Result Date: 10/17/2021 CLINICAL DATA:  Productive, weakness EXAM: PORTABLE CHEST 1 VIEW COMPARISON:  10/14/2021 FINDINGS: Stable cardiomediastinal contours. Aortic atherosclerosis. Subtly increased interstitial markings within the perihilar and bibasilar regions. No focal consolidation. No pleural effusion or pneumothorax. Advanced degenerative changes of the shoulders. Bones are demineralized. IMPRESSION: Subtly increased interstitial markings within the perihilar and bibasilar regions may reflect mild edema versus developing atypical/viral infection. Electronically Signed   By: Davina Poke D.O.   On: 10/17/2021 11:00   CT Angio Chest PE W and/or Wo Contrast  Result Date: 10/14/2021 CLINICAL DATA:  Chronic cough EXAM: CT ANGIOGRAPHY CHEST WITH CONTRAST TECHNIQUE: Multidetector CT imaging of the chest was performed using the standard protocol during bolus administration of intravenous contrast. Multiplanar CT image reconstructions and MIPs were obtained to evaluate the vascular anatomy. RADIATION DOSE REDUCTION: This exam was performed  according to the departmental dose-optimization program which includes automated exposure control, adjustment of the mA and/or kV according to patient size and/or use of iterative reconstruction technique. CONTRAST:  13m OMNIPAQUE IOHEXOL 350 MG/ML SOLN COMPARISON:  Chest x-ray 10/14/2021 FINDINGS: Cardiovascular: Satisfactory opacification of the pulmonary arteries to the segmental level. Small subsegmental embolus within peripheral right lower lobe pulmonary vessel, series 5 image 263, coronal series 7, image 85. No other discrete filling defects are visualized. Moderate aortic atherosclerosis without aneurysm. Slightly dilated pulmonary trunk up to 3.5 cm. Cardiomegaly. No pericardial effusion. Mediastinum/Nodes: Midline trachea. Status post thyroidectomy. No suspicious lymph nodes. Esophagus within normal limits. Lungs/Pleura: No acute airspace disease. No pleural effusion or pneumothorax. Multiple small pulmonary nodules, for example 4 mm fissural nodule, series 6 image 41. Subpleural right lower lobe pulmonary nodule measuring 5 mm, series 6, image 102. Several small left lower lobe pulmonary nodules with the largest seen in the subpleural left lower lobe measuring 7 mm, series 6, image 71. Upper Abdomen: No acute abnormality. Musculoskeletal: Degenerative changes.  No acute osseous abnormality Review of the MIP images confirms the above findings. IMPRESSION: 1. Probable small subsegmental right lower lobe pulmonary embolus. No other discrete filling defects are visualized. 2. No acute airspace disease. Multiple small pulmonary nodules measuring up to 7 mm in size. Non-contrast chest CT at 3-6 months is recommended. If the nodules are stable at time of repeat CT, then future CT at 18-24 months (from today's scan) is considered optional for low-risk patients, but is recommended for high-risk patients. This recommendation follows the consensus statement: Guidelines for Management of Incidental Pulmonary Nodules  Detected on CT Images: From the Fleischner Society 2017; Radiology 2017; 284:228-243. 3. Slightly enlarged appearing pulmonary trunk suggestive of arterial hypertension. Cardiomegaly. Critical Value/emergent results were called by telephone at the time of interpretation on 10/14/2021 at 10:48 pm to [facility], who verbally acknowledged these results. Aortic Atherosclerosis (ICD10-I70.0). Electronically Signed   By: KDonavan FoilM.D.   On: 10/14/2021 22:48   DG Chest 2 View  Result Date: 10/14/2021 CLINICAL DATA:  Cough thick yellow sputum EXAM: CHEST - 2 VIEW COMPARISON:  09/06/2021 FINDINGS: Mild bronchitic changes without focal airspace disease, pleural effusion or pneumothorax. Mild cardiomegaly with aortic atherosclerosis. Degenerative changes of both shoulders with probable small calcified loose body on the left. IMPRESSION: Mild bronchitic changes without focal airspace disease. Mild cardiomegaly Electronically Signed   By: KDonavan FoilM.D.   On: 10/14/2021 19:16     The results of significant diagnostics from this hospitalization (  including imaging, microbiology, ancillary and laboratory) are listed below for reference.     Microbiology: Recent Results (from the past 240 hour(s))  Urine Culture     Status: Abnormal   Collection Time: 10/17/21  9:56 AM   Specimen: Urine, Clean Catch  Result Value Ref Range Status   Specimen Description   Final    URINE, CLEAN CATCH Performed at [facility], [address] [place], [place] [address]    Special Requests   Final    NONE Performed at [facility] [address] [place], [place] [address]    Culture >=100,000 COLONIES/mL ESCHERICHIA COLI (A)  Final   Report Status 10/19/2021 FINAL  Final   Organism ID, Bacteria ESCHERICHIA COLI (A)  Final      Susceptibility   Escherichia coli - MIC*    AMPICILLIN <=2 SENSITIVE Sensitive     CEFAZOLIN <=4 SENSITIVE Sensitive     CEFEPIME <=0.12 SENSITIVE Sensitive     CEFTRIAXONE  <=0.25 SENSITIVE Sensitive     CIPROFLOXACIN <=0.25 SENSITIVE Sensitive     GENTAMICIN <=1 SENSITIVE Sensitive     IMIPENEM <=0.25 SENSITIVE Sensitive     NITROFURANTOIN <=16 SENSITIVE Sensitive     TRIMETH/SULFA <=20 SENSITIVE Sensitive     AMPICILLIN/SULBACTAM <=2 SENSITIVE Sensitive     PIP/TAZO <=4 SENSITIVE Sensitive     * >=100,000 COLONIES/mL ESCHERICHIA COLI     Labs: BNP (last 3 results) No results for input(s): "BNP" in the last 8760 hours. Basic Metabolic Panel: Recent Labs  Lab 10/14/21 1859 10/17/21 0956 10/17/21 1803 10/18/21 0524 10/19/21 0613  NA 136 133*  --  134* 138  K 3.5 3.2*  --  3.4* 3.3*  CL 97* 93*  --  103 103  CO2 27 28  --  24 26  GLUCOSE 134* 123*  --  120* 122*  BUN 34* 39*  --  38* 31*  CREATININE 1.58* 1.72*  --  1.70* 1.37*  CALCIUM 7.5* 7.2*  --  6.5* 6.6*  MG  --   --  1.9  --   --    Liver Function Tests: Recent Labs  Lab 10/17/21 0956  AST 21  ALT 10  ALKPHOS 54  BILITOT 1.4*  PROT 8.4*  ALBUMIN 3.5   No results for input(s): "LIPASE", "AMYLASE" in the last 168 hours. No results for input(s): "AMMONIA" in the last 168 hours. CBC: Recent Labs  Lab 10/14/21 1859 10/17/21 0956 10/18/21 0524 10/19/21 0613  WBC 11.8* 11.6* 8.8 8.5  NEUTROABS 10.0* 9.7*  --   --   HGB 10.3* 10.6* 9.5* 9.6*  HCT 32.1* 32.4* 30.2* 30.7*  MCV 89.2 88.0 89.3 90.8  PLT 281 298 249 269   Cardiac Enzymes: No results for input(s): "CKTOTAL", "CKMB", "CKMBINDEX", "TROPONINI" in the last 168 hours. BNP: Invalid input(s): "POCBNP" CBG: No results for input(s): "GLUCAP" in the last 168 hours. D-Dimer No results for input(s): "DDIMER" in the last 72 hours. Hgb A1c No results for input(s): "HGBA1C" in the last 72 hours. Lipid Profile No results for input(s): "CHOL", "HDL", "LDLCALC", "TRIG", "CHOLHDL", "LDLDIRECT" in the last 72 hours. Thyroid function studies No results for input(s): "TSH", "T4TOTAL", "T3FREE", "THYROIDAB" in the last 72  hours.  Invalid input(s): "FREET3" Anemia work up No results for input(s): "VITAMINB12", "FOLATE", "FERRITIN", "TIBC", "IRON", "RETICCTPCT" in the last 72 hours. Urinalysis    Component Value Date/Time   COLORURINE YELLOW 10/17/2021 0956   APPEARANCEUR HAZY (A) 10/17/2021 0956   LABSPEC 1.012  10/17/2021 0956   PHURINE 5.0 10/17/2021 0956   GLUCOSEU NEGATIVE 10/17/2021 0956   HGBUR MODERATE (A) 10/17/2021 0956   BILIRUBINUR NEGATIVE 10/17/2021 0956   BILIRUBINUR negative 09/13/2021 1157   KETONESUR NEGATIVE 10/17/2021 0956   PROTEINUR 30 (A) 10/17/2021 0956   UROBILINOGEN 0.2 09/13/2021 1157   UROBILINOGEN 1.0 03/13/2011 0011   NITRITE NEGATIVE 10/17/2021 0956   LEUKOCYTESUR LARGE (A) 10/17/2021 0956   Sepsis Labs Recent Labs  Lab 10/14/21 1859 10/17/21 0956 10/18/21 0524 10/19/21 0613  WBC 11.8* 11.6* 8.8 8.5   Microbiology Recent Results (from the past 240 hour(s))  Urine Culture     Status: Abnormal   Collection Time: 10/17/21  9:56 AM   Specimen: Urine, Clean Catch  Result Value Ref Range Status   Specimen Description   Final    URINE, CLEAN CATCH Performed at [facility], [address], [place], [place] [address]    Special Requests   Final    NONE Performed at [facility], [address] [place], [place] [address]    Culture >=100,000 COLONIES/mL ESCHERICHIA COLI (A)  Final   Report Status 10/19/2021 FINAL  Final   Organism ID, Bacteria ESCHERICHIA COLI (A)  Final      Susceptibility   Escherichia coli - MIC*    AMPICILLIN <=2 SENSITIVE Sensitive     CEFAZOLIN <=4 SENSITIVE Sensitive     CEFEPIME <=0.12 SENSITIVE Sensitive     CEFTRIAXONE <=0.25 SENSITIVE Sensitive     CIPROFLOXACIN <=0.25 SENSITIVE Sensitive     GENTAMICIN <=1 SENSITIVE Sensitive     IMIPENEM <=0.25 SENSITIVE Sensitive     NITROFURANTOIN <=16 SENSITIVE Sensitive     TRIMETH/SULFA <=20 SENSITIVE Sensitive     AMPICILLIN/SULBACTAM <=2 SENSITIVE Sensitive     PIP/TAZO <=4 SENSITIVE  Sensitive     * >=100,000 COLONIES/mL ESCHERICHIA COLI     Time coordinating discharge:  I have spent 35 minutes face to face with the patient and on the ward discussing the patients care, assessment, plan and disposition with other care givers. >50% of the time was devoted counseling the patient about the risks and benefits of treatment/Discharge disposition and coordinating care.   SIGNED:   Damita Lack, MD  [facility] 10/19/2021, 1:29 PM   If 7PM-7AM, please contact night-coverage

## 2021-10-20 DIAGNOSIS — Z79899 Other long term (current) drug therapy: Secondary | ICD-10-CM | POA: Diagnosis not present

## 2021-10-21 ENCOUNTER — Ambulatory Visit: Payer: PPO | Admitting: Family Medicine

## 2021-10-23 DIAGNOSIS — I2699 Other pulmonary embolism without acute cor pulmonale: Secondary | ICD-10-CM | POA: Diagnosis not present

## 2021-10-23 DIAGNOSIS — I129 Hypertensive chronic kidney disease with stage 1 through stage 4 chronic kidney disease, or unspecified chronic kidney disease: Secondary | ICD-10-CM | POA: Diagnosis not present

## 2021-10-23 DIAGNOSIS — E785 Hyperlipidemia, unspecified: Secondary | ICD-10-CM | POA: Diagnosis not present

## 2021-10-23 DIAGNOSIS — N1831 Chronic kidney disease, stage 3a: Secondary | ICD-10-CM | POA: Diagnosis not present

## 2021-10-26 DIAGNOSIS — I10 Essential (primary) hypertension: Secondary | ICD-10-CM | POA: Diagnosis not present

## 2021-11-13 DIAGNOSIS — I129 Hypertensive chronic kidney disease with stage 1 through stage 4 chronic kidney disease, or unspecified chronic kidney disease: Secondary | ICD-10-CM | POA: Diagnosis not present

## 2021-11-13 DIAGNOSIS — N1831 Chronic kidney disease, stage 3a: Secondary | ICD-10-CM | POA: Diagnosis not present

## 2021-11-13 DIAGNOSIS — H9193 Unspecified hearing loss, bilateral: Secondary | ICD-10-CM | POA: Diagnosis not present

## 2021-11-13 DIAGNOSIS — E785 Hyperlipidemia, unspecified: Secondary | ICD-10-CM | POA: Diagnosis not present

## 2021-11-13 DIAGNOSIS — E039 Hypothyroidism, unspecified: Secondary | ICD-10-CM | POA: Diagnosis not present

## 2021-11-13 DIAGNOSIS — G629 Polyneuropathy, unspecified: Secondary | ICD-10-CM | POA: Diagnosis not present

## 2021-11-13 DIAGNOSIS — Z85850 Personal history of malignant neoplasm of thyroid: Secondary | ICD-10-CM | POA: Diagnosis not present

## 2021-11-13 DIAGNOSIS — Z87440 Personal history of urinary (tract) infections: Secondary | ICD-10-CM | POA: Diagnosis not present

## 2021-11-13 DIAGNOSIS — J309 Allergic rhinitis, unspecified: Secondary | ICD-10-CM | POA: Diagnosis not present

## 2021-11-13 DIAGNOSIS — E876 Hypokalemia: Secondary | ICD-10-CM | POA: Diagnosis not present

## 2021-11-13 DIAGNOSIS — I700 Atherosclerosis of aorta: Secondary | ICD-10-CM | POA: Diagnosis not present

## 2021-11-13 DIAGNOSIS — M1990 Unspecified osteoarthritis, unspecified site: Secondary | ICD-10-CM | POA: Diagnosis not present

## 2021-11-13 DIAGNOSIS — D631 Anemia in chronic kidney disease: Secondary | ICD-10-CM | POA: Diagnosis not present

## 2021-11-13 DIAGNOSIS — Z9009 Acquired absence of other part of head and neck: Secondary | ICD-10-CM | POA: Diagnosis not present

## 2021-11-13 DIAGNOSIS — I2699 Other pulmonary embolism without acute cor pulmonale: Secondary | ICD-10-CM | POA: Diagnosis not present

## 2021-11-14 ENCOUNTER — Ambulatory Visit (INDEPENDENT_AMBULATORY_CARE_PROVIDER_SITE_OTHER): Payer: PPO | Admitting: Family Medicine

## 2021-11-14 ENCOUNTER — Encounter: Payer: Self-pay | Admitting: Family Medicine

## 2021-11-14 VITALS — BP 109/71 | HR 75 | Temp 98.5°F | Wt 131.6 lb

## 2021-11-14 DIAGNOSIS — R911 Solitary pulmonary nodule: Secondary | ICD-10-CM | POA: Diagnosis not present

## 2021-11-14 DIAGNOSIS — I2693 Single subsegmental pulmonary embolism without acute cor pulmonale: Secondary | ICD-10-CM | POA: Diagnosis not present

## 2021-11-14 DIAGNOSIS — R531 Weakness: Secondary | ICD-10-CM | POA: Diagnosis not present

## 2021-11-14 DIAGNOSIS — I10 Essential (primary) hypertension: Secondary | ICD-10-CM | POA: Diagnosis not present

## 2021-11-14 LAB — CBC
HCT: 33 % — ABNORMAL LOW (ref 36.0–46.0)
Hemoglobin: 10.9 g/dL — ABNORMAL LOW (ref 12.0–15.0)
MCHC: 33.1 g/dL (ref 30.0–36.0)
MCV: 86.1 fl (ref 78.0–100.0)
Platelets: 319 10*3/uL (ref 150.0–400.0)
RBC: 3.83 Mil/uL — ABNORMAL LOW (ref 3.87–5.11)
RDW: 14.3 % (ref 11.5–15.5)
WBC: 6.2 10*3/uL (ref 4.0–10.5)

## 2021-11-14 MED ORDER — APIXABAN 5 MG PO TABS: 5.0000 mg | ORAL_TABLET | Freq: Two times a day (BID) | ORAL | 0 refills | Status: DC

## 2021-11-14 NOTE — Progress Notes (Signed)
[name] [name] is a 88 y.o. female who presents today for an office visit.  Assessment/Plan:  New/Acute Problems: UTI Resolved.  Chronic Problems Addressed Today: Pulmonary embolism (HCC) Currently on Eliquis 5 mg twice daily.  They will continue for total duration of 3 months.  She is tolerating well.  No significant side effects.  Generalized weakness Doing much better.  Likely secondary to her PE.  She will continue to work with home health physical therapy.  Essential hypertension At goal off medications.  Pulmonary nodule Incidentally found on CTA last month.  Radiology recommended repeat CT scan in 3 to 6 months.  They will follow-up in 2 to 3 months and we can order CT scan at that time.     Subjective:  HPI:  PAtient here for follow-up.  We last saw her a couple months ago.  She was admitted to the hospital about a month ago.  Went to the ED on 10/17/2021 with generalized weakness.  Found to have UTI and possible bronchitis or pneumonia.  Her urine culture grew E. coli.  She was given antibiotics.  Her symptoms improved and she was discharged to SNF on 10/19/2021. Just prior to this admission she was seen in the ED on 10/14/2021 and was diagnosed with a PE.  She has been home for couple days since being discharged from SNF. She has been doing well. Home health PT has been coming out.    ROS: Per HPI, otherwise a complete review of systems was negative.   PMH:  The following were reviewed and entered/updated in epic: Past Medical History:  Diagnosis Date   ALLERGIC RHINITIS 11/02/2006   CKD (chronic kidney disease), stage III ([place])    HYPERLIPIDEMIA 11/04/2006   HYPOTHYROIDISM, POSTSURGICAL 03/28/2007   Malignant neoplasm of thyroid gland ([place]) 03/28/2007   5/07: righte lobectomy--path shows 3 cm follicular ca 4/09: completion left lobectomy, no more tumor seen 09/07: thyroglobulin 0.2 9ab neg) 11/07: i-131 rx, 101 [facility] 6/08: thyroglogulin 0.2 (ab neg) , hypothyroid body  scan neg (clinically tolerated hypothyroidism poorly) 06/09: tg undetectable (ab=1.5, normal) 12/10: tg undetectable (ab neg) 6/11: tg undectable (ab neg)   OSTEOPENIA 11/02/2006   Patient Active Problem List   Diagnosis Date Noted   Pulmonary nodule 11/14/2021   Generalized weakness 10/17/2021   Pulmonary embolism ([place]) 10/17/2021   Essential hypertension 01/11/2021   Leg edema 12/01/2020   Plantar flexed metatarsal bone of left foot 11/24/2020   Plantar flexed metatarsal bone of right foot 11/24/2020   Chronic sinusitis 07/17/2019   Right leg weakness 10/15/2018   Corns and callus 09/25/2018   Shingles 04/05/2018   HLD (hyperlipidemia) 08/05/2017   CKD (chronic kidney disease), stage III ([place]) 08/05/2017   Numbness 11/19/2014   Malignant neoplasm of thyroid gland ([place]) 03/28/2007   HYPOTHYROIDISM, POSTSURGICAL 03/28/2007   HYPOPARATHYROIDISM 03/28/2007   Allergic rhinitis 11/02/2006   OSTEOPENIA 11/02/2006   Past Surgical History:  Procedure Laterality Date   ABDOMINAL HYSTERECTOMY     CERVICAL POLYPECTOMY     OPEN REDUCTION INTERNAL FIXATION (ORIF) DISTAL RADIAL FRACTURE Left 01/14/2014   Procedure: OPEN REDUCTION INTERNAL FIXATION DISTAL RADIAL FRACTURE;  Surgeon: Linna Hoff, MD;  Location: [facility];  Service: Orthopedics;  Laterality: Left;    Family History  Problem Relation Age of Onset   Diabetes Sister     Medications- reviewed and updated Current Outpatient Medications  Medication Sig Dispense Refill   albuterol (VENTOLIN HFA) 108 (90 Base) MCG/ACT inhaler Inhale 2 puffs  into the lungs every 6 (six) hours as needed for wheezing or shortness of breath. 8 g 2   apixaban (ELIQUIS) 5 MG TABS tablet Take 1 tablet (5 mg total) by mouth 2 (two) times daily. 120 tablet 0   APIXABAN (ELIQUIS) VTE STARTER PACK ('10MG'$  AND '5MG'$ ) Take as directed on package: start with two-'5mg'$  tablets twice daily for 7 days. On day 8, switch to one-'5mg'$  tablet twice daily. (Patient taking  differently: Take 10 mg by mouth in the morning and at bedtime. Take as directed on package: start with two-'5mg'$  tablets twice daily for 7 days. On day 8, switch to one-'5mg'$  tablet twice daily.) 1 each 0   docusate sodium (COLACE) 100 MG capsule Take 100 mg by mouth at bedtime.      furosemide (LASIX) 40 MG tablet Take 1 tablet (40 mg total) by mouth 2 (two) times daily. 180 tablet 3   gabapentin (NEURONTIN) 100 MG capsule Take 1 capsule (100 mg total) by mouth at bedtime.     guaiFENesin (MUCINEX) 600 MG 12 hr tablet Take by mouth 2 (two) times daily.     levothyroxine (SYNTHROID) 112 MCG tablet Take 1 tablet (112 mcg total) by mouth daily. 90 tablet 0   fluticasone (FLONASE) 50 MCG/ACT nasal spray Place 2 sprays into both nostrils daily. 9.9 mL 2   No current facility-administered medications for this visit.    Allergies-reviewed and updated Allergies  Allergen Reactions   Verapamil Other (See Comments)    edema     Social History   Socioeconomic History   Marital status: Widowed    Spouse name: Not on file   Number of children: Not on file   Years of education: Not on file   Highest education level: Not on file  Occupational History   Not on file  Tobacco Use   Smoking status: Never   Smokeless tobacco: Never  Vaping Use   Vaping Use: Never used  Substance and Sexual Activity   Alcohol use: Not Currently   Drug use: Never   Sexual activity: Not on file  Other Topics Concern   Not on file  Social History Narrative   Doesn't drive   Social Determinants of Health   Financial Resource Strain: Not on file  Food Insecurity: Not on file  Transportation Needs: Not on file  Physical Activity: Not on file  Stress: Not on file  Social Connections: Not on file          Objective:  Physical Exam: BP 109/71   Pulse 75   Temp 98.5 F (36.9 C) (Temporal)   Wt 131 lb 9.6 oz (59.7 kg)   SpO2 97%   BMI 23.31 kg/m   Gen: No acute distress, resting comfortably CV:  Regular rate and rhythm with no murmurs appreciated Pulm: Normal work of breathing, clear to auscultation bilaterally with no crackles, wheezes, or rhonchi Neuro: Grossly normal, moves all extremities Psych: Normal affect and thought content  Time Spent: 45 minutes of total time was spent on the date of the encounter performing the following actions: chart review prior to seeing the patient including recent hospitalization and ED visit, obtaining history, performing a medically necessary exam, counseling on the treatment plan, placing orders, and documenting in our EHR.        Algis Greenhouse. Jerline Pain, MD 11/14/2021 1:23 PM

## 2021-11-14 NOTE — Assessment & Plan Note (Signed)
At goal off medications.

## 2021-11-14 NOTE — Patient Instructions (Signed)
It was very nice to see you today!  We will check blood work today.  Please continue your blood thinner.  We will need to continue this for a total of 3 months.  I will see back in 2 to 3 months.  Please come back to see me sooner if needed.  Take care, Dr Jerline Pain  PLEASE NOTE:  If you had any lab tests please let us know if you have not heard back within a few days. You may see your results on mychart before we have a chance to review them but we will give you a call once they are reviewed by [place]. If we ordered any referrals today, please let us know if you have not heard from their office within the next week.   Please try these tips to maintain a healthy lifestyle:  Eat at least 3 REAL meals and 1-2 snacks per day.  Aim for no more than 5 hours between eating.  If you eat breakfast, please do so within one hour of getting up.   Each meal should contain half fruits/vegetables, one quarter protein, and one quarter carbs (no bigger than a computer mouse)  Cut down on sweet beverages. This includes juice, soda, and sweet tea.   Drink at least 1 glass of water with each meal and aim for at least 8 glasses per day  Exercise at least 150 minutes every week.

## 2021-11-14 NOTE — Assessment & Plan Note (Signed)
Doing much better.  Likely secondary to her PE.  She will continue to work with home health physical therapy.

## 2021-11-14 NOTE — Assessment & Plan Note (Signed)
Incidentally found on CTA last month.  Radiology recommended repeat CT scan in 3 to 6 months.  They will follow-up in 2 to 3 months and we can order CT scan at that time.

## 2021-11-14 NOTE — Assessment & Plan Note (Signed)
Currently on Eliquis 5 mg twice daily.  They will continue for total duration of 3 months.  She is tolerating well.  No significant side effects.

## 2021-11-15 LAB — COMPREHENSIVE METABOLIC PANEL
ALT: 9 U/L (ref 0–35)
AST: 20 U/L (ref 0–37)
Albumin: 3.9 g/dL (ref 3.5–5.2)
Alkaline Phosphatase: 42 U/L (ref 39–117)
BUN: 37 mg/dL — ABNORMAL HIGH (ref 6–23)
CO2: 30 mEq/L (ref 19–32)
Calcium: 8.6 mg/dL (ref 8.4–10.5)
Chloride: 94 mEq/L — ABNORMAL LOW (ref 96–112)
Creatinine, Ser: 1.51 mg/dL — ABNORMAL HIGH (ref 0.40–1.20)
GFR: 29.96 mL/min — ABNORMAL LOW (ref >60.00)
Glucose, Bld: 106 mg/dL — ABNORMAL HIGH (ref 70–99)
Potassium: 3.9 mEq/L (ref 3.5–5.1)
Sodium: 137 mEq/L (ref 135–145)
Total Bilirubin: 0.5 mg/dL (ref 0.2–1.2)
Total Protein: 8 g/dL (ref 6.0–8.3)

## 2021-11-16 ENCOUNTER — Ambulatory Visit: Payer: PPO | Admitting: Podiatry

## 2021-11-16 ENCOUNTER — Encounter: Payer: Self-pay | Admitting: Podiatry

## 2021-11-16 DIAGNOSIS — M79675 Pain in left toe(s): Secondary | ICD-10-CM | POA: Diagnosis not present

## 2021-11-16 DIAGNOSIS — B351 Tinea unguium: Secondary | ICD-10-CM | POA: Diagnosis not present

## 2021-11-16 DIAGNOSIS — M79674 Pain in right toe(s): Secondary | ICD-10-CM

## 2021-11-16 DIAGNOSIS — N183 Chronic kidney disease, stage 3 unspecified: Secondary | ICD-10-CM

## 2021-11-16 NOTE — Progress Notes (Signed)
Please inform patient of the following:  Labs are stable.  Do not need to do any further testing at this point.  We can recheck at her next office visit.

## 2021-11-16 NOTE — Progress Notes (Signed)
This patient returns to my office for at risk foot care.  This patient requires this care by a professional since this patient will be at risk due to having CKD.   This patient is unable to cut nails herself since the patient cannot reach her nails.These nails are painful walking and wearing shoes.  Patient presents to the office with her daughter. This patient presents for at risk foot care today.  General Appearance  Alert, conversant and in no acute stress.  Vascular  Dorsalis pedis and posterior tibial  pulses are weakly  palpable  bilaterally.  Capillary return is within normal limits  bilaterally. Cold feet.    Bilaterally. Absent digital toenails.  Neurologic  Senn-Weinstein monofilament wire test within normal limits  bilaterally. Muscle power within normal limits bilaterally.  Nails Thick disfigured discolored nails with subungual debris  from hallux to fifth toes bilaterally. No evidence of bacterial infection or drainage bilaterally.  Orthopedic  No limitations of motion  feet .  No crepitus or effusions noted.  No bony pathology or digital deformities noted.  Plantar flexed fifth metatarsal  B/L.  Skin  normotropic skin  noted bilaterally.  No signs of infections or ulcers noted.   Porokeratosis sub 5th  B/L.  Onychomycosis  Pain in right toes  Pain in left toes  Porokeratosis  B/L.  Consent was obtained for treatment procedures.   Mechanical debridement of nails 1-5  bilaterally performed with a nail nipper.  Filed with dremel without incident.    Return office visit    3 months                  Told patient to return for periodic foot care and evaluation due to potential at risk complications.   Gardiner Barefoot DPM

## 2021-11-22 ENCOUNTER — Telehealth: Payer: Self-pay | Admitting: Family Medicine

## 2021-11-22 NOTE — Telephone Encounter (Signed)
.  Home Health Certification or Plan of Care Tracking  Is this a Certification or Plan of Care? yes  [place] Agency: [facility]  Order Number:  [id number]  Has charge sheet been attached? yes  Where has form been placed:  In provider's box  Faxed to:   [phone]

## 2021-11-26 NOTE — Telephone Encounter (Signed)
Form Faxed--

## 2021-12-01 DIAGNOSIS — N1831 Chronic kidney disease, stage 3a: Secondary | ICD-10-CM | POA: Diagnosis not present

## 2021-12-01 DIAGNOSIS — H9193 Unspecified hearing loss, bilateral: Secondary | ICD-10-CM | POA: Diagnosis not present

## 2021-12-01 DIAGNOSIS — E876 Hypokalemia: Secondary | ICD-10-CM | POA: Diagnosis not present

## 2021-12-01 DIAGNOSIS — E785 Hyperlipidemia, unspecified: Secondary | ICD-10-CM | POA: Diagnosis not present

## 2021-12-01 DIAGNOSIS — J309 Allergic rhinitis, unspecified: Secondary | ICD-10-CM | POA: Diagnosis not present

## 2021-12-01 DIAGNOSIS — Z9009 Acquired absence of other part of head and neck: Secondary | ICD-10-CM | POA: Diagnosis not present

## 2021-12-01 DIAGNOSIS — E039 Hypothyroidism, unspecified: Secondary | ICD-10-CM | POA: Diagnosis not present

## 2021-12-01 DIAGNOSIS — I700 Atherosclerosis of aorta: Secondary | ICD-10-CM | POA: Diagnosis not present

## 2021-12-01 DIAGNOSIS — Z85850 Personal history of malignant neoplasm of thyroid: Secondary | ICD-10-CM | POA: Diagnosis not present

## 2021-12-01 DIAGNOSIS — Z87440 Personal history of urinary (tract) infections: Secondary | ICD-10-CM | POA: Diagnosis not present

## 2021-12-01 DIAGNOSIS — I2699 Other pulmonary embolism without acute cor pulmonale: Secondary | ICD-10-CM | POA: Diagnosis not present

## 2021-12-01 DIAGNOSIS — M1990 Unspecified osteoarthritis, unspecified site: Secondary | ICD-10-CM | POA: Diagnosis not present

## 2021-12-01 DIAGNOSIS — G629 Polyneuropathy, unspecified: Secondary | ICD-10-CM | POA: Diagnosis not present

## 2021-12-01 DIAGNOSIS — D631 Anemia in chronic kidney disease: Secondary | ICD-10-CM | POA: Diagnosis not present

## 2021-12-01 DIAGNOSIS — I129 Hypertensive chronic kidney disease with stage 1 through stage 4 chronic kidney disease, or unspecified chronic kidney disease: Secondary | ICD-10-CM | POA: Diagnosis not present

## 2021-12-12 DIAGNOSIS — Z9009 Acquired absence of other part of head and neck: Secondary | ICD-10-CM | POA: Diagnosis not present

## 2021-12-12 DIAGNOSIS — I700 Atherosclerosis of aorta: Secondary | ICD-10-CM | POA: Diagnosis not present

## 2021-12-12 DIAGNOSIS — J309 Allergic rhinitis, unspecified: Secondary | ICD-10-CM | POA: Diagnosis not present

## 2021-12-12 DIAGNOSIS — M1990 Unspecified osteoarthritis, unspecified site: Secondary | ICD-10-CM | POA: Diagnosis not present

## 2021-12-12 DIAGNOSIS — D631 Anemia in chronic kidney disease: Secondary | ICD-10-CM | POA: Diagnosis not present

## 2021-12-12 DIAGNOSIS — E876 Hypokalemia: Secondary | ICD-10-CM | POA: Diagnosis not present

## 2021-12-12 DIAGNOSIS — I2699 Other pulmonary embolism without acute cor pulmonale: Secondary | ICD-10-CM | POA: Diagnosis not present

## 2021-12-12 DIAGNOSIS — E785 Hyperlipidemia, unspecified: Secondary | ICD-10-CM | POA: Diagnosis not present

## 2021-12-12 DIAGNOSIS — G629 Polyneuropathy, unspecified: Secondary | ICD-10-CM | POA: Diagnosis not present

## 2021-12-12 DIAGNOSIS — H9193 Unspecified hearing loss, bilateral: Secondary | ICD-10-CM | POA: Diagnosis not present

## 2021-12-12 DIAGNOSIS — I129 Hypertensive chronic kidney disease with stage 1 through stage 4 chronic kidney disease, or unspecified chronic kidney disease: Secondary | ICD-10-CM | POA: Diagnosis not present

## 2021-12-12 DIAGNOSIS — Z85850 Personal history of malignant neoplasm of thyroid: Secondary | ICD-10-CM | POA: Diagnosis not present

## 2021-12-12 DIAGNOSIS — E039 Hypothyroidism, unspecified: Secondary | ICD-10-CM | POA: Diagnosis not present

## 2021-12-12 DIAGNOSIS — N1831 Chronic kidney disease, stage 3a: Secondary | ICD-10-CM | POA: Diagnosis not present

## 2021-12-12 DIAGNOSIS — Z87440 Personal history of urinary (tract) infections: Secondary | ICD-10-CM | POA: Diagnosis not present

## 2021-12-13 ENCOUNTER — Other Ambulatory Visit: Payer: Self-pay | Admitting: Endocrinology

## 2022-01-02 ENCOUNTER — Encounter: Payer: Self-pay | Admitting: *Deleted

## 2022-01-17 ENCOUNTER — Ambulatory Visit (INDEPENDENT_AMBULATORY_CARE_PROVIDER_SITE_OTHER): Payer: PPO | Admitting: Family Medicine

## 2022-01-17 ENCOUNTER — Encounter: Payer: Self-pay | Admitting: Family Medicine

## 2022-01-17 VITALS — BP 133/67 | HR 77 | Temp 98.4°F | Ht 63.0 in | Wt 127.4 lb

## 2022-01-17 DIAGNOSIS — B029 Zoster without complications: Secondary | ICD-10-CM | POA: Diagnosis not present

## 2022-01-17 DIAGNOSIS — I10 Essential (primary) hypertension: Secondary | ICD-10-CM

## 2022-01-17 DIAGNOSIS — E890 Postprocedural hypothyroidism: Secondary | ICD-10-CM

## 2022-01-17 DIAGNOSIS — Z23 Encounter for immunization: Secondary | ICD-10-CM | POA: Diagnosis not present

## 2022-01-17 DIAGNOSIS — R911 Solitary pulmonary nodule: Secondary | ICD-10-CM | POA: Diagnosis not present

## 2022-01-17 DIAGNOSIS — I2693 Single subsegmental pulmonary embolism without acute cor pulmonale: Secondary | ICD-10-CM

## 2022-01-17 DIAGNOSIS — M1990 Unspecified osteoarthritis, unspecified site: Secondary | ICD-10-CM | POA: Insufficient documentation

## 2022-01-17 LAB — CBC
HCT: 34.9 % — ABNORMAL LOW (ref 36.0–46.0)
Hemoglobin: 11.7 g/dL — ABNORMAL LOW (ref 12.0–15.0)
MCHC: 33.6 g/dL (ref 30.0–36.0)
MCV: 88.1 fl (ref 78.0–100.0)
Platelets: 281 10*3/uL (ref 150.0–400.0)
RBC: 3.97 Mil/uL (ref 3.87–5.11)
RDW: 14 % (ref 11.5–15.5)
WBC: 5.1 10*3/uL (ref 4.0–10.5)

## 2022-01-17 LAB — COMPREHENSIVE METABOLIC PANEL
ALT: 9 U/L (ref 0–35)
AST: 18 U/L (ref 0–37)
Albumin: 4.1 g/dL (ref 3.5–5.2)
Alkaline Phosphatase: 42 U/L (ref 39–117)
BUN: 26 mg/dL — ABNORMAL HIGH (ref 6–23)
CO2: 31 mEq/L (ref 19–32)
Calcium: 8.6 mg/dL (ref 8.4–10.5)
Chloride: 99 mEq/L (ref 96–112)
Creatinine, Ser: 1.36 mg/dL — ABNORMAL HIGH (ref 0.40–1.20)
GFR: 33.92 mL/min — ABNORMAL LOW (ref >60.00)
Glucose, Bld: 84 mg/dL (ref 70–99)
Potassium: 3.9 mEq/L (ref 3.5–5.1)
Sodium: 140 mEq/L (ref 135–145)
Total Bilirubin: 0.4 mg/dL (ref 0.2–1.2)
Total Protein: 7.9 g/dL (ref 6.0–8.3)

## 2022-01-17 LAB — TSH: TSH: 0.08 u[IU]/mL — ABNORMAL LOW (ref 0.35–5.50)

## 2022-01-17 MED ORDER — VALACYCLOVIR HCL 1 G PO TABS: 1000.0000 mg | ORAL_TABLET | Freq: Every day | ORAL | 5 refills | Status: DC

## 2022-01-17 MED ORDER — LEVOTHYROXINE SODIUM 112 MCG PO TABS: 112.0000 ug | ORAL_TABLET | Freq: Every day | ORAL | 0 refills | Status: DC

## 2022-01-17 NOTE — Assessment & Plan Note (Signed)
Follows with endocrinology.  She will be establishing with new endocrinologist in a few months and needs refill on Synthroid.  We will recheck TSH today and refill medication today.

## 2022-01-17 NOTE — Assessment & Plan Note (Signed)
Overall stable.  Can continue using over-the-counter medications as needed.

## 2022-01-17 NOTE — Patient Instructions (Signed)
It was very nice to see you today!  I am glad that you are feeling better.  We we will check blood work today.  I will get a CT scan to follow-up on the pulmonary nodule.  I will see back in 3 to 6 months.  Come back to see me sooner if needed.  Take care, Dr Jerline Pain  PLEASE NOTE:  If you had any lab tests please let us know if you have not heard back within a few days. You may see your results on mychart before we have a chance to review them but we will give you a call once they are reviewed by [place]. If we ordered any referrals today, please let us know if you have not heard from their office within the next week.   Please try these tips to maintain a healthy lifestyle:  Eat at least 3 REAL meals and 1-2 snacks per day.  Aim for no more than 5 hours between eating.  If you eat breakfast, please do so within one hour of getting up.   Each meal should contain half fruits/vegetables, one quarter protein, and one quarter carbs (no bigger than a computer mouse)  Cut down on sweet beverages. This includes juice, soda, and sweet tea.   Drink at least 1 glass of water with each meal and aim for at least 8 glasses per day  Exercise at least 150 minutes every week.

## 2022-01-17 NOTE — Assessment & Plan Note (Signed)
We will repeat CT scan today per radiologist recommendations.

## 2022-01-17 NOTE — Assessment & Plan Note (Signed)
We will refill Valtrex.  She uses as needed for flareups.

## 2022-01-17 NOTE — Progress Notes (Signed)
   [name] [name] is a 88 y.o. female who presents today for an office visit.  Assessment/Plan:  Chronic Problems Addressed Today: Pulmonary nodule We will repeat CT scan today per radiologist recommendations.  Osteoarthritis Overall stable.  Can continue using over-the-counter medications as needed.  HYPOTHYROIDISM, POSTSURGICAL Follows with endocrinology.  She will be establishing with new endocrinologist in a few months and needs refill on Synthroid.  We will recheck TSH today and refill medication today.  Pulmonary embolism ([place]) She has finished 3 months of anticoagulation.  Essential hypertension At goal today off medications.  Shingles We will refill Valtrex.  She uses as needed for flareups.  Flu shot given today.     Subjective:  HPI:  Patient here today for follow-up.  We saw her 2 months ago for hospital follow-up for PE.  She has been anticoagulated on Eliquis 5 mg twice daily.  She has finished 3 months of anticoagulation is on her taking this.  She is doing well today.  She does have occasional joint pain that is managed with over-the-counter medications.       Objective:  Physical Exam: BP 133/67   Pulse 77   Temp 98.4 F (36.9 C) (Temporal)   Ht '5\' 3"'$  (1.6 m)   Wt 127 lb 6.4 oz (57.8 kg)   SpO2 97%   BMI 22.57 kg/m   Gen: No acute distress, resting comfortably CV: Regular rate and rhythm with no murmurs appreciated Pulm: Normal work of breathing, clear to auscultation bilaterally with no crackles, wheezes, or rhonchi Neuro: Grossly normal, moves all extremities Psych: Normal affect and thought content      [name] M. Jerline Pain, MD 01/17/2022 10:34 AM

## 2022-01-17 NOTE — Assessment & Plan Note (Signed)
She has finished 3 months of anticoagulation.

## 2022-01-17 NOTE — Assessment & Plan Note (Signed)
At goal today off medications.

## 2022-01-18 ENCOUNTER — Ambulatory Visit: Payer: PPO | Admitting: Family Medicine

## 2022-01-20 NOTE — Progress Notes (Signed)
Please inform patient of the following:  Her thyroid level is a little bit off.  Do not need to make any changes to her treatment plan at this time though recommend she follow-up with endocrinology soon.  If she cannot get into see one soon recommend she come back here to have this rechecked in a couple of weeks before we make any medication adjustments.  The rest of her blood work is normal

## 2022-01-24 ENCOUNTER — Other Ambulatory Visit: Payer: Self-pay

## 2022-01-24 DIAGNOSIS — E2089 Other specified hypoparathyroidism: Secondary | ICD-10-CM

## 2022-01-27 ENCOUNTER — Telehealth: Payer: Self-pay | Admitting: Family Medicine

## 2022-01-27 NOTE — Telephone Encounter (Signed)
Copied from [place] [id number]. Topic: Medicare AWV >> Jan 27, 2022 10:58 AM Jae Dire wrote: Reason for CRM:  Left message for patient to call back and schedule Medicare Annual Wellness Visit (AWV) in office.   If not able to come in the office, please offer to do virtually or by telephone.   No hx of AWV - AWV-I eligible per [place] as of 04/10/2009  Please schedule at any time with [place].   45 minute appointment  Any questions, please contact me at [phone]

## 2022-02-13 ENCOUNTER — Other Ambulatory Visit (INDEPENDENT_AMBULATORY_CARE_PROVIDER_SITE_OTHER): Payer: PPO

## 2022-02-13 DIAGNOSIS — E2089 Other specified hypoparathyroidism: Secondary | ICD-10-CM | POA: Diagnosis not present

## 2022-02-13 LAB — TSH: TSH: 0.08 u[IU]/mL — ABNORMAL LOW (ref 0.35–5.50)

## 2022-02-15 ENCOUNTER — Other Ambulatory Visit: Payer: Self-pay | Admitting: *Deleted

## 2022-02-15 DIAGNOSIS — E2089 Other specified hypoparathyroidism: Secondary | ICD-10-CM

## 2022-02-15 NOTE — Progress Notes (Signed)
Please inform patient of the following:  Thyroid level still off. Recommend we change her Synthroid to 100 mcg daily.  Please send a new prescription.  She should come back in 6 weeks to recheck.

## 2022-02-17 ENCOUNTER — Telehealth: Payer: Self-pay | Admitting: Family Medicine

## 2022-02-17 NOTE — Telephone Encounter (Signed)
Caller states: -pt was told a new medication would be sent in to the pharmacy. -Pharmacy has not received new script -Patient still has supply of the previous medicine   -From Lab Notes: Thyroid level still off. Recommend we change her Synthroid to 100 mcg daily.  Please send a new prescription.  She should come back in 6 weeks to recheck    Caller Requests: -Script sent to pharmacy.  levothyroxine (SYNTHROID) 112 MCG tablet [[id number]]    [facility] ([address]), [place] [address] ([place]) [place] [address] Phone: [phone]  Fax: [phone]

## 2022-02-20 ENCOUNTER — Ambulatory Visit
Admission: RE | Admit: 2022-02-20 | Discharge: 2022-02-20 | Disposition: A | Discharge status: Home / Self Care | Payer: PPO | Source: Ambulatory Visit | Attending: Family Medicine | Admitting: Family Medicine

## 2022-02-20 ENCOUNTER — Other Ambulatory Visit: Payer: Self-pay | Admitting: *Deleted

## 2022-02-20 DIAGNOSIS — R911 Solitary pulmonary nodule: Secondary | ICD-10-CM | POA: Diagnosis not present

## 2022-02-20 DIAGNOSIS — R918 Other nonspecific abnormal finding of lung field: Secondary | ICD-10-CM | POA: Diagnosis not present

## 2022-02-20 DIAGNOSIS — I700 Atherosclerosis of aorta: Secondary | ICD-10-CM | POA: Diagnosis not present

## 2022-02-20 MED ORDER — LEVOTHYROXINE SODIUM 100 MCG PO TABS: 100.0000 ug | ORAL_TABLET | Freq: Every day | ORAL | 1 refills | Status: DC

## 2022-02-20 NOTE — Telephone Encounter (Signed)
New Rx send to pharmacy

## 2022-02-22 ENCOUNTER — Ambulatory Visit: Payer: PPO | Admitting: Podiatry

## 2022-02-22 ENCOUNTER — Other Ambulatory Visit: Payer: Self-pay | Admitting: *Deleted

## 2022-02-22 ENCOUNTER — Encounter: Payer: Self-pay | Admitting: Podiatry

## 2022-02-22 DIAGNOSIS — M79675 Pain in left toe(s): Secondary | ICD-10-CM | POA: Diagnosis not present

## 2022-02-22 DIAGNOSIS — B351 Tinea unguium: Secondary | ICD-10-CM

## 2022-02-22 DIAGNOSIS — M79674 Pain in right toe(s): Secondary | ICD-10-CM

## 2022-02-22 DIAGNOSIS — N183 Chronic kidney disease, stage 3 unspecified: Secondary | ICD-10-CM

## 2022-02-22 DIAGNOSIS — R911 Solitary pulmonary nodule: Secondary | ICD-10-CM

## 2022-02-22 NOTE — Progress Notes (Signed)
Please inform patient of the following:  CT scan shows improvement with many of the pulmonary nodules though still does have a small nodule in the right lung base.  Radiology recommended repeating CT scan again in 18 months.  Please place future order.

## 2022-02-22 NOTE — Progress Notes (Signed)
This patient returns to my office for at risk foot care.  This patient requires this care by a professional since this patient will be at risk due to having CKD.   This patient is unable to cut nails herself since the patient cannot reach her nails.These nails are painful walking and wearing shoes.  Patient presents to the office with her daughter. This patient presents for at risk foot care today.  General Appearance  Alert, conversant and in no acute stress.  Vascular  Dorsalis pedis and posterior tibial  pulses are weakly  palpable  bilaterally.  Capillary return is within normal limits  bilaterally. Cold feet.    Bilaterally. Absent digital toenails.  Neurologic  Senn-Weinstein monofilament wire test within normal limits  bilaterally. Muscle power within normal limits bilaterally.  Nails Thick disfigured discolored nails with subungual debris  from hallux to fifth toes bilaterally. No evidence of bacterial infection or drainage bilaterally.  Orthopedic  No limitations of motion  feet .  No crepitus or effusions noted.  No bony pathology or digital deformities noted.  Plantar flexed fifth metatarsal  B/L.  Skin  normotropic skin  noted bilaterally.  No signs of infections or ulcers noted.    Onychomycosis  Pain in right toes  Pain in left toes  Porokeratosis  B/L.  Consent was obtained for treatment procedures.   Mechanical debridement of nails 1-5  bilaterally performed with a nail nipper.  Filed with dremel without incident.    Return office visit    3 months                  Told patient to return for periodic foot care and evaluation due to potential at risk complications.   Gardiner Barefoot DPM

## 2022-03-07 ENCOUNTER — Telehealth: Payer: Self-pay

## 2022-03-07 MED ORDER — CALCITRIOL 0.5 MCG PO CAPS: 0.5000 ug | ORAL_CAPSULE | Freq: Every day | ORAL | 1 refills | Status: DC

## 2022-03-07 NOTE — Telephone Encounter (Signed)
[place] requesting refill on Calcitriol  0.'5mg'$  capsule but I don't see it on the current med list. Patient saw Loanne Drilling and will see you in January.

## 2022-03-07 NOTE — Telephone Encounter (Signed)
done

## 2022-03-20 ENCOUNTER — Other Ambulatory Visit (INDEPENDENT_AMBULATORY_CARE_PROVIDER_SITE_OTHER): Payer: PPO

## 2022-03-20 DIAGNOSIS — N183 Chronic kidney disease, stage 3 unspecified: Secondary | ICD-10-CM | POA: Diagnosis not present

## 2022-03-20 DIAGNOSIS — E2089 Other specified hypoparathyroidism: Secondary | ICD-10-CM | POA: Diagnosis not present

## 2022-03-20 LAB — BASIC METABOLIC PANEL
BUN: 25 mg/dL — ABNORMAL HIGH (ref 6–23)
CO2: 33 mEq/L — ABNORMAL HIGH (ref 19–32)
Calcium: 9.5 mg/dL (ref 8.4–10.5)
Chloride: 98 mEq/L (ref 96–112)
Creatinine, Ser: 1.38 mg/dL — ABNORMAL HIGH (ref 0.40–1.20)
GFR: 33.29 mL/min — ABNORMAL LOW (ref >60.00)
Glucose, Bld: 111 mg/dL — ABNORMAL HIGH (ref 70–99)
Potassium: 5 mEq/L (ref 3.5–5.1)
Sodium: 141 mEq/L (ref 135–145)

## 2022-03-20 LAB — TSH: TSH: 0.46 u[IU]/mL (ref 0.35–5.50)

## 2022-03-22 NOTE — Progress Notes (Signed)
Please inform patient of the following:  Thyroid levels are back at goal.  She should continue her current dose.  Her kidney function is stable.  Do not need to make any changes to treatment plan.  We can check next time

## 2022-03-23 ENCOUNTER — Other Ambulatory Visit: Payer: Self-pay | Admitting: Family Medicine

## 2022-05-03 ENCOUNTER — Ambulatory Visit: Payer: PPO | Admitting: Internal Medicine

## 2022-05-11 ENCOUNTER — Ambulatory Visit (INDEPENDENT_AMBULATORY_CARE_PROVIDER_SITE_OTHER): Payer: PPO | Admitting: Family

## 2022-05-11 ENCOUNTER — Encounter: Payer: Self-pay | Admitting: Family

## 2022-05-11 VITALS — BP 140/88 | HR 69 | Temp 97.5°F | Ht 63.0 in | Wt 119.6 lb

## 2022-05-11 DIAGNOSIS — R1310 Dysphagia, unspecified: Secondary | ICD-10-CM

## 2022-05-11 DIAGNOSIS — M1990 Unspecified osteoarthritis, unspecified site: Secondary | ICD-10-CM | POA: Diagnosis not present

## 2022-05-11 DIAGNOSIS — R531 Weakness: Secondary | ICD-10-CM

## 2022-05-11 NOTE — Patient Instructions (Signed)
It was very nice to see you today!   As discussed, push more fluids, keep a thermos of water and encourage sipping all day. Thicker liquids may go down easier. For example, nectar, fruit smoothies, etc.  Can also buy thickening powder to use in any drink to thicken and sometimes this is easier to swallow. You can find this either at the pharmacy or possibly grocery store.  Encourage [name] [name] to deep breathe often, using an Incentive spirometer (plastic device that she inhales and makes disc rise) is helpful with this. They give these often when in the hospital but you can also buy at a local medical store.       PLEASE NOTE:  If you had any lab tests please let us know if you have not heard back within a few days. You may see your results on MyChart before we have a chance to review them but we will give you a call once they are reviewed by [place]. If we ordered any referrals today, please let us know if you have not heard from their office within the next week.

## 2022-05-11 NOTE — Progress Notes (Signed)
Patient ID: [name] [name], female    DOB: Dec 27, 1927, 89 y.o.   MRN: [id number]  Chief Complaint  Patient presents with   Shortness of Breath    Pt c/o SOB and unable to swallow for 2 days. Pt states her throat is very dry.    *Patient is accompanied by their daughter today, who is helping to provide HPI/medical information, pt very HOH.   HPI:      Myalgia/joint pain:  pt reports daily pain in her lower legs, starts in knees, wakes up with the pain, this has been chronic according to her dtr.   Dysphagia:  reports feeling choked at times, has a lot of mucus, phlegm, dtr states she coughs and spits out. Pt reports it makes her feel like she can't get her breath.    Assessment & Plan:  1. Osteoarthritis, unspecified osteoarthritis type, unspecified site pt and dtr given sample of voltaren gel, advised can get OTC, advised on use & SE.   - diclofenac Sodium (VOLTAREN) 1 % GEL; Apply 2 g topically 4 (four) times daily as needed (For pain.).  Dispense: 100 g; Refill: 3  2. Dysphagia, unspecified type throat without erythema, lungs clear. d/t weakness, age, advised to practice deep breathing, can look if have IS at home or get at local medical store. Reports having cough, ok to continue taking Mucinex daily, must drink more water, ok to thicken with OTC thickener, can drink nectar or smoothies. Advised to only eat soft foods, no small pieces while having so much throat mucus.    Subjective:    Outpatient Medications Prior to Visit  Medication Sig Dispense Refill   calcitRIOL (ROCALTROL) 0.5 MCG capsule Take 1 capsule (0.5 mcg total) by mouth daily. 90 capsule 1   docusate sodium (COLACE) 100 MG capsule Take 100 mg by mouth at bedtime.      furosemide (LASIX) 40 MG tablet Take 1 tablet by mouth twice daily 180 tablet 0   gabapentin (NEURONTIN) 100 MG capsule Take 1 capsule (100 mg total) by mouth at bedtime.     guaiFENesin (MUCINEX) 600 MG 12 hr tablet Take by mouth 2 (two) times  daily.     levothyroxine (SYNTHROID) 100 MCG tablet Take 1 tablet (100 mcg total) by mouth daily. 90 tablet 1   valACYclovir (VALTREX) 1000 MG tablet Take 1 tablet (1,000 mg total) by mouth daily. Take 1 tablet daily for 5 days as needed for outbreaks 30 tablet 5   fluticasone (FLONASE) 50 MCG/ACT nasal spray Place 2 sprays into both nostrils daily. 9.9 mL 2   No facility-administered medications prior to visit.   Past Medical History:  Diagnosis Date   ALLERGIC RHINITIS 11/02/2006   CKD (chronic kidney disease), stage III ([place])    HYPERLIPIDEMIA 11/04/2006   HYPOTHYROIDISM, POSTSURGICAL 03/28/2007   Malignant neoplasm of thyroid gland ([place]) 03/28/2007   5/07: righte lobectomy--path shows 3 cm follicular ca 8/54: completion left lobectomy, no more tumor seen 09/07: thyroglobulin 0.2 9ab neg) 11/07: i-131 rx, 101 mci 6/08: thyroglogulin 0.2 (ab neg) , hypothyroid body scan neg (clinically tolerated hypothyroidism poorly) 06/09: tg undetectable (ab=1.5, normal) 12/10: tg undetectable (ab neg) 6/11: tg undectable (ab neg)   OSTEOPENIA 11/02/2006   Past Surgical History:  Procedure Laterality Date   ABDOMINAL HYSTERECTOMY     CERVICAL POLYPECTOMY     OPEN REDUCTION INTERNAL FIXATION (ORIF) DISTAL RADIAL FRACTURE Left 01/14/2014   Procedure: OPEN REDUCTION INTERNAL FIXATION DISTAL RADIAL FRACTURE;  Surgeon: Janelle Floor  Caralyn Guile, MD;  Location: [place] [facility];  Service: Orthopedics;  Laterality: Left;   Allergies  Allergen Reactions   Verapamil Other (See Comments)    edema       Objective:    Physical Exam Vitals and nursing note reviewed.  Constitutional:      Appearance: Normal appearance.  Cardiovascular:     Rate and Rhythm: Normal rate and regular rhythm.  Pulmonary:     Effort: Pulmonary effort is normal.     Breath sounds: Normal breath sounds.  Musculoskeletal:        General: Normal range of motion.  Skin:    General: Skin is warm and dry.  Neurological:     Mental Status: She is  alert.     Sensory: Sensation is intact.     Motor: Weakness present.     Coordination: Coordination abnormal.     Gait: Gait abnormal (pt in wheelchair).  Psychiatric:        Mood and Affect: Mood normal.        Behavior: Behavior normal.    BP (!) 140/88   Pulse 69   Temp (!) 97.5 F (36.4 C) (Temporal)   Ht '5\' 3"'$  (1.6 m)   Wt 119 lb 9.6 oz (54.3 kg)   SpO2 99%   BMI 21.19 kg/m  Wt Readings from Last 3 Encounters:  05/11/22 119 lb 9.6 oz (54.3 kg)  01/17/22 127 lb 6.4 oz (57.8 kg)  11/14/21 131 lb 9.6 oz (59.7 kg)      Jeanie Sewer, NP

## 2022-05-12 MED ORDER — DICLOFENAC SODIUM 1 % EX GEL: 2.0000 g | Freq: Four times a day (QID) | CUTANEOUS | 3 refills | Status: DC | PRN

## 2022-05-13 ENCOUNTER — Other Ambulatory Visit: Payer: Self-pay | Admitting: Physician Assistant

## 2022-05-31 ENCOUNTER — Ambulatory Visit: Payer: PPO | Admitting: Podiatry

## 2022-06-19 ENCOUNTER — Other Ambulatory Visit: Payer: Self-pay | Admitting: Family Medicine

## 2022-06-22 ENCOUNTER — Other Ambulatory Visit: Payer: Self-pay | Admitting: Family Medicine

## 2022-06-26 ENCOUNTER — Ambulatory Visit: Payer: PPO | Admitting: Podiatry

## 2022-07-05 ENCOUNTER — Telehealth: Payer: Self-pay | Admitting: Family Medicine

## 2022-07-05 NOTE — Telephone Encounter (Signed)
Copied from [place] [id number]. Topic: Medicare AWV >> Jul 05, 2022 10:30 AM Gillis Santa wrote: Reason for CRM: Called patient to schedule Medicare Annual Wellness Visit (AWV). Left message for patient to call back and schedule Medicare Annual Wellness Visit (AWV).  Last date of AWV: N/A  Please schedule an appointment at any time with Otila Kluver, [facility]. Please schedule AWVS with Otila Kluver, [place].  If any questions, please contact me at [phone].  Thank you ,  Shaune Pollack [facility] [place] AWV TEAM Direct Dial [phone]

## 2022-07-17 ENCOUNTER — Ambulatory Visit: Payer: PPO | Admitting: Podiatry

## 2022-07-20 ENCOUNTER — Other Ambulatory Visit: Payer: Self-pay | Admitting: Family Medicine

## 2022-07-23 ENCOUNTER — Other Ambulatory Visit: Payer: Self-pay | Admitting: Family Medicine

## 2022-08-18 ENCOUNTER — Other Ambulatory Visit: Payer: Self-pay | Admitting: Family Medicine

## 2022-08-21 ENCOUNTER — Encounter: Payer: Self-pay | Admitting: Internal Medicine

## 2022-08-21 ENCOUNTER — Telehealth: Payer: Self-pay | Admitting: Family Medicine

## 2022-08-21 ENCOUNTER — Ambulatory Visit (INDEPENDENT_AMBULATORY_CARE_PROVIDER_SITE_OTHER): Payer: PPO | Admitting: Internal Medicine

## 2022-08-21 VITALS — BP 120/70 | HR 93 | Ht 63.0 in | Wt 113.0 lb

## 2022-08-21 DIAGNOSIS — Z85850 Personal history of malignant neoplasm of thyroid: Secondary | ICD-10-CM | POA: Diagnosis not present

## 2022-08-21 DIAGNOSIS — E2089 Other specified hypoparathyroidism: Secondary | ICD-10-CM

## 2022-08-21 DIAGNOSIS — E890 Postprocedural hypothyroidism: Secondary | ICD-10-CM

## 2022-08-21 LAB — BASIC METABOLIC PANEL
BUN: 26 mg/dL — ABNORMAL HIGH (ref 6–23)
CO2: 33 mEq/L — ABNORMAL HIGH (ref 19–32)
Calcium: 9 mg/dL (ref 8.4–10.5)
Chloride: 98 mEq/L (ref 96–112)
Creatinine, Ser: 1.45 mg/dL — ABNORMAL HIGH (ref 0.40–1.20)
GFR: 31.28 mL/min — ABNORMAL LOW (ref >60.00)
Glucose, Bld: 87 mg/dL (ref 70–99)
Potassium: 4.2 mEq/L (ref 3.5–5.1)
Sodium: 140 mEq/L (ref 135–145)

## 2022-08-21 LAB — ALBUMIN: Albumin: 4.1 g/dL (ref 3.5–5.2)

## 2022-08-21 NOTE — Progress Notes (Unsigned)
Name: [name] [name]  MRN/ DOB: [id number], [birth year]    Age/ Sex: 89 y.o., female     PCP: Ardith Dark, MD   Reason for Endocrinology Evaluation: Postoperative hypothyroid/hypocalcemia     Initial Endocrinology Clinic Visit: 09/11/2012    PATIENT IDENTIFIER: [name] [name] is a 89 y.o., female with a past medical history of Hx FTC, HTN, PE. She has followed with [facility] since 09/11/2012 for consultative assistance with management of her postoperative hypothyroid/hypocalcemia.   HISTORICAL SUMMARY:  5/07:  right lobectomy--path shows 3 cm right lobe follicular ca (T2 N0 M0).   8/07: completion left lobectomy, no more tumor seen.   9/07  thyroglobulin 11.8 (ab neg).   11/07: RAI, 101 mCi 6/08 thyroglogulin 0.2 (ab neg).   6/08: hypothyroid body scan neg (she tolerated thyroid hormone withdrawal poorly).   6/09 tg undetectable (ab=1.5) 12/10: tg undetectable (ab neg) 6/11:  tg undetectable (ab neg) 6/12: tg undetectable (ab neg) 6/13: tg undetectable (ab neg) 6/15: tg undetectable (ab neg) 6/16: tg undetectable (ab neg).   8/17: tg=0.1 (ab neg).  2/19: tg undetectable (ab neg). 5/20: tg undetectable (ab neg).  3/21 tg undetectable (ab neg).  10/22 tg undetectable (ab neg).    Postsurgical hypothyroidism: (due to SVT, she is not a candidate for TSH suppression) Postsurgical hypoparathyroidism (she takes rocaltrol; due to SVT, goal Ca++ is in normal range)   SUBJECTIVE:    Today (08/21/2022):  [name] [name] is here for for follow-up on postoperative hypothyroid and hypocalcemia.  She is accompanied by her daughter, who provided most of the history Denies local neck swelling  Denies palpitations  Denies tremors  Denies constipation or diarrhea  Patient with multiple joint aches and pains  HOME ENDOCRINE MEDICATIONS: Levothyroxine 100 mcg daily Calcitriol 0.5 mcg daily Calcium 500 mg daily    HISTORY:  Past Medical History:  Past Medical  History:  Diagnosis Date   ALLERGIC RHINITIS 11/02/2006   CKD (chronic kidney disease), stage III ([facility])    HYPERLIPIDEMIA 11/04/2006   HYPOTHYROIDISM, POSTSURGICAL 03/28/2007   Malignant neoplasm of thyroid gland ([facility]) 03/28/2007   5/07: righte lobectomy--path shows 3 cm follicular ca 8/07: completion left lobectomy, no more tumor seen 09/07: thyroglobulin 0.2 9ab neg) 11/07: i-131 rx, 101 mci 6/08: thyroglogulin 0.2 (ab neg) , hypothyroid body scan neg (clinically tolerated hypothyroidism poorly) 06/09: tg undetectable (ab=1.5, normal) 12/10: tg undetectable (ab neg) 6/11: tg undectable (ab neg)   OSTEOPENIA 11/02/2006   Past Surgical History:  Past Surgical History:  Procedure Laterality Date   ABDOMINAL HYSTERECTOMY     CERVICAL POLYPECTOMY     OPEN REDUCTION INTERNAL FIXATION (ORIF) DISTAL RADIAL FRACTURE Left 01/14/2014   Procedure: OPEN REDUCTION INTERNAL FIXATION DISTAL RADIAL FRACTURE;  Surgeon: Sharma Covert, MD;  Location: [facility];  Service: Orthopedics;  Laterality: Left;   Social History:  reports that she has never smoked. She has never used smokeless tobacco. She reports that she does not currently use alcohol. She reports that she does not use drugs. Family History:  Family History  Problem Relation Age of Onset   Diabetes Sister      HOME MEDICATIONS: Allergies as of 08/21/2022       Reactions   Verapamil Other (See Comments)   edema         Medication List        Accurate as of Aug 21, 2022  1:54 PM. If you have any questions, ask your nurse or  doctor.          STOP taking these medications    diclofenac Sodium 1 % Gel Commonly known as: VOLTAREN Stopped by: Scarlette Shorts, MD   fluticasone 50 MCG/ACT nasal spray Commonly known as: FLONASE Stopped by: Scarlette Shorts, MD       TAKE these medications    calcitRIOL 0.5 MCG capsule Commonly known as: ROCALTROL Take 1 capsule (0.5 mcg total) by mouth daily.   docusate sodium 100 MG  capsule Commonly known as: COLACE Take 100 mg by mouth at bedtime.   furosemide 40 MG tablet Commonly known as: LASIX Take 1 tablet by mouth twice daily   gabapentin 100 MG capsule Commonly known as: NEURONTIN Take 1 capsule (100 mg total) by mouth at bedtime.   guaiFENesin 600 MG 12 hr tablet Commonly known as: MUCINEX Take by mouth 2 (two) times daily.   levothyroxine 100 MCG tablet Commonly known as: SYNTHROID Take 1 tablet by mouth once daily   valACYclovir 1000 MG tablet Commonly known as: VALTREX Take 1 tablet (1,000 mg total) by mouth daily. Take 1 tablet daily for 5 days as needed for outbreaks          OBJECTIVE:   PHYSICAL EXAM: VS: BP 120/70 (BP Location: Left Arm, Patient Position: Sitting, Cuff Size: Small)   Pulse 93   Ht 5\' 3"  (1.6 m)   Wt 113 lb (51.3 kg)   SpO2 97%   BMI 20.02 kg/m    EXAM: General: Pt appears well and is in NAD  Neck: General: Supple without adenopathy. Thyroid:  No goiter or nodules appreciated  Lungs: Clear with good BS bilat   Heart: Auscultation: RRR.  Extremities:  BL LE: No pretibial edema   Mental Status: Judgment, insight: Intact Orientation: Oriented to time, place, and person Mood and affect: No depression, anxiety, or agitation     DATA REVIEWED: ***  Old records , labs and images have been reviewed.    ASSESSMENT / PLAN / RECOMMENDATIONS:   Postoperative hypothyroidism:   Medications   2.  Postoperative hypocalcemia:    3. Hx FTC:  -S/p total thyroidectomy secondary to follicular thyroid carcinoma in 2007 -S/P RAI 2007 -WBS no evidence of mets/recurrence 2009 -Last thyroid bed ultrasound 2015 did not show any evidence of recurrence      Signed electronically by: Lyndle Herrlich, MD  [facility]  [facility] [address] [place], [place] [address] Phone: [phone] FAX: [phone]      CC: Ardith Dark, MD [address] [place] [address] Phone: [phone]  Fax: [phone]   Return to Endocrinology clinic as below: Future Appointments  Date Time Provider Department Center  08/28/2022 10:45 AM Helane Gunther, DPM [facility]

## 2022-08-21 NOTE — Telephone Encounter (Signed)
Copied from CRM [id number]. Topic: Medicare AWV >> Aug 21, 2022  8:54 AM Gwenith Spitz wrote: Reason for CRM: Called patient to schedule Medicare Annual Wellness Visit (AWV). Left message for patient to call back and schedule Medicare Annual Wellness Visit (AWV).  Last date of AWV: n/a  Please schedule an appointment at any time with Inetta Fermo, [facility]. Please schedule AWVS with Inetta Fermo, [facility].  If any questions, please contact me at [phone].  Thank you ,  Gabriel Cirri [facility] AWV TEAM Direct Dial [phone]

## 2022-08-22 ENCOUNTER — Telehealth: Payer: Self-pay | Admitting: Internal Medicine

## 2022-08-22 LAB — TSH: TSH: 0.16 u[IU]/mL — ABNORMAL LOW (ref 0.35–5.50)

## 2022-08-22 LAB — VITAMIN D 25 HYDROXY (VIT D DEFICIENCY, FRACTURES): VITD: 23.12 ng/mL — ABNORMAL LOW (ref 30.00–100.00)

## 2022-08-22 MED ORDER — LEVOTHYROXINE SODIUM 88 MCG PO TABS: 88.0000 ug | ORAL_TABLET | Freq: Every day | ORAL | 3 refills | Status: DC

## 2022-08-22 NOTE — Telephone Encounter (Signed)
1.Please let the patient or her daughter know that her thyroid test shows that she is on too much levothyroxine.  Please stop levothyroxine 100 mcg, and start levothyroxine 88 mcg daily   2.  Vitamin D is low, needs to start over-the-counter vitamin D3 1000 IU daily     3.  Calcium is normal, continue multivitamin and calcitriol daily    Thanks

## 2022-08-22 NOTE — Telephone Encounter (Signed)
Patient daughter advised and will make medication adjustment

## 2022-08-23 LAB — THYROGLOBULIN LEVEL: Thyroglobulin: 0.1 ng/mL — ABNORMAL LOW

## 2022-08-23 LAB — THYROGLOBULIN ANTIBODY: Thyroglobulin Ab: <1 IU/mL (ref <=1)

## 2022-08-28 ENCOUNTER — Ambulatory Visit: Payer: PPO | Admitting: Podiatry

## 2022-08-31 ENCOUNTER — Other Ambulatory Visit: Payer: Self-pay | Admitting: Internal Medicine

## 2022-09-07 ENCOUNTER — Encounter: Payer: Self-pay | Admitting: Family Medicine

## 2022-09-08 ENCOUNTER — Other Ambulatory Visit: Payer: Self-pay | Admitting: *Deleted

## 2022-09-08 MED ORDER — TRIAMCINOLONE ACETONIDE 0.5 % EX OINT: 1.0000 | TOPICAL_OINTMENT | Freq: Two times a day (BID) | CUTANEOUS | 0 refills | Status: DC

## 2022-09-08 NOTE — Telephone Encounter (Signed)
Ok to send in prescription for triamcinolone 0.5% ointment use twice daily as needed to affected area.  Needs visit if still not improving.  [name]. [name], MD 09/08/2022 12:16 PM

## 2022-09-08 NOTE — Telephone Encounter (Signed)
Please advise 

## 2022-09-08 NOTE — Telephone Encounter (Signed)
Rx send to pharamcy  

## 2022-09-13 ENCOUNTER — Other Ambulatory Visit: Payer: Self-pay | Admitting: Family Medicine

## 2022-09-14 MED ORDER — FUROSEMIDE 40 MG PO TABS: 40.0000 mg | ORAL_TABLET | Freq: Two times a day (BID) | ORAL | 0 refills | Status: DC

## 2022-10-04 ENCOUNTER — Other Ambulatory Visit: Payer: Self-pay | Admitting: Family Medicine

## 2022-10-05 MED ORDER — FUROSEMIDE 40 MG PO TABS: 40.0000 mg | ORAL_TABLET | Freq: Two times a day (BID) | ORAL | 0 refills | Status: DC

## 2022-10-05 NOTE — Telephone Encounter (Signed)
Pt requesting refill for Gabapentin 100 mg. Last OV 01/2022.

## 2022-10-13 ENCOUNTER — Ambulatory Visit (INDEPENDENT_AMBULATORY_CARE_PROVIDER_SITE_OTHER): Payer: PPO | Admitting: Family

## 2022-10-13 VITALS — BP 166/70 | HR 67 | Temp 97.0°F | Ht 63.0 in | Wt 113.0 lb

## 2022-10-13 DIAGNOSIS — N3000 Acute cystitis without hematuria: Secondary | ICD-10-CM

## 2022-10-13 LAB — POCT URINALYSIS DIPSTICK
Bilirubin, UA: NEGATIVE
Blood, UA: NEGATIVE
Glucose, UA: NEGATIVE
Ketones, UA: NEGATIVE
Nitrite, UA: NEGATIVE
Protein, UA: NEGATIVE
Spec Grav, UA: 1.01 (ref 1.010–1.025)
Urobilinogen, UA: 0.2 E.U./dL
pH, UA: 6 (ref 5.0–8.0)

## 2022-10-13 MED ORDER — CEPHALEXIN 500 MG PO CAPS
500.0000 mg | ORAL_CAPSULE | Freq: Two times a day (BID) | ORAL | 0 refills | Status: DC
Start: 2022-10-13 — End: 2022-12-25

## 2022-10-13 NOTE — Progress Notes (Signed)
Patient ID: [name] [name], female    DOB: 1927-08-05, 89 y.o.   MRN: [id number]  Chief Complaint  Patient presents with   Urine odor    Pt c/o urine odor, Noticed 4 days ago. No other sx    HPI: Urinary sx:    pt with dtr who reports pt having foul odor with her urine also darker in color. Pt denies any dysuria, pelvic or back pain, itching & no hematuria noticed. Denies any recent diarrhea. No recent UTI.       Assessment & Plan:  1. Acute cystitis without hematuria- sending generic Keflex, advised on use & SE. OK to eat yogurt or take an OTC probiotic every day while taking abt. Increase water intake as much as able up to 2L per day if possible.  - POCT Urinalysis Dipstick   Subjective:    Outpatient Medications Prior to Visit  Medication Sig Dispense Refill   calcitRIOL (ROCALTROL) 0.5 MCG capsule Take 1 capsule by mouth once daily 90 capsule 1   docusate sodium (COLACE) 100 MG capsule Take 100 mg by mouth at bedtime.      furosemide (LASIX) 40 MG tablet Take 1 tablet (40 mg total) by mouth 2 (two) times daily. Please schedule 6 month follow up with Dr. [name] for further refills [phone] 60 tablet 0   gabapentin (NEURONTIN) 100 MG capsule Take 1 capsule by mouth at bedtime 90 capsule 0   guaiFENesin (MUCINEX) 600 MG 12 hr tablet Take by mouth 2 (two) times daily.     levothyroxine (SYNTHROID) 88 MCG tablet Take 1 tablet (88 mcg total) by mouth daily. 90 tablet 3   triamcinolone ointment (KENALOG) 0.5 % Apply 1 Application topically 2 (two) times daily. as needed to affected area. 30 g 0   valACYclovir (VALTREX) 1000 MG tablet Take 1 tablet (1,000 mg total) by mouth daily. Take 1 tablet daily for 5 days as needed for outbreaks 30 tablet 5   No facility-administered medications prior to visit.   Past Medical History:  Diagnosis Date   ALLERGIC RHINITIS 11/02/2006   CKD (chronic kidney disease), stage III (HCC)    HYPERLIPIDEMIA 11/04/2006   HYPOTHYROIDISM, POSTSURGICAL  03/28/2007   Malignant neoplasm of thyroid gland (HCC) 03/28/2007   5/07: righte lobectomy--path shows 3 cm follicular ca 8/07: completion left lobectomy, no more tumor seen 09/07: thyroglobulin 0.2 9ab neg) 11/07: i-131 rx, 101 [facility] 6/08: thyroglogulin 0.2 (ab neg) , hypothyroid body scan neg (clinically tolerated hypothyroidism poorly) 06/09: tg undetectable (ab=1.5, normal) 12/10: tg undetectable (ab neg) 6/11: tg undectable (ab neg)   OSTEOPENIA 11/02/2006   Past Surgical History:  Procedure Laterality Date   ABDOMINAL HYSTERECTOMY     CERVICAL POLYPECTOMY     OPEN REDUCTION INTERNAL FIXATION (ORIF) DISTAL RADIAL FRACTURE Left 01/14/2014   Procedure: OPEN REDUCTION INTERNAL FIXATION DISTAL RADIAL FRACTURE;  Surgeon: Sharma Covert, MD;  Location: [facility] OR;  Service: Orthopedics;  Laterality: Left;   Allergies  Allergen Reactions   Verapamil Other (See Comments)    edema       Objective:    Physical Exam Vitals and nursing note reviewed.  Constitutional:      Appearance: Normal appearance.  Cardiovascular:     Rate and Rhythm: Normal rate and regular rhythm.  Pulmonary:     Effort: Pulmonary effort is normal.     Breath sounds: Normal breath sounds.  Musculoskeletal:        General: Normal range of motion.  Skin:  General: Skin is warm and dry.  Neurological:     Mental Status: She is alert. Mental status is at baseline.     Motor: Weakness present.     Gait: Gait abnormal (in wheelchair).  Psychiatric:        Mood and Affect: Mood normal.        Behavior: Behavior normal.    BP (!) 166/72   Pulse 67   Temp (!) 97 F (36.1 C) (Temporal)   Ht 5\' 3"  (1.6 m)   Wt 113 lb (51.3 kg)   SpO2 98%   BMI 20.02 kg/m  Wt Readings from Last 3 Encounters:  10/13/22 113 lb (51.3 kg)  08/21/22 113 lb (51.3 kg)  05/11/22 119 lb 9.6 oz (54.3 kg)       Dulce Sellar, NP

## 2022-10-15 LAB — URINE CULTURE
MICRO NUMBER:: 15164821
SPECIMEN QUALITY:: ADEQUATE

## 2022-12-25 ENCOUNTER — Emergency Department (HOSPITAL_COMMUNITY)
Admission: EM | Admit: 2022-12-25 | Discharge: 2022-12-25 | Disposition: A | Discharge status: Home / Self Care | Payer: PPO | Attending: Emergency Medicine | Admitting: Emergency Medicine

## 2022-12-25 ENCOUNTER — Emergency Department (HOSPITAL_COMMUNITY): Payer: PPO

## 2022-12-25 ENCOUNTER — Encounter (HOSPITAL_COMMUNITY): Payer: Self-pay

## 2022-12-25 DIAGNOSIS — I6529 Occlusion and stenosis of unspecified carotid artery: Secondary | ICD-10-CM | POA: Diagnosis not present

## 2022-12-25 DIAGNOSIS — M47812 Spondylosis without myelopathy or radiculopathy, cervical region: Secondary | ICD-10-CM | POA: Diagnosis not present

## 2022-12-25 DIAGNOSIS — N183 Chronic kidney disease, stage 3 unspecified: Secondary | ICD-10-CM | POA: Diagnosis not present

## 2022-12-25 DIAGNOSIS — M542 Cervicalgia: Secondary | ICD-10-CM | POA: Insufficient documentation

## 2022-12-25 DIAGNOSIS — W19XXXA Unspecified fall, initial encounter: Secondary | ICD-10-CM | POA: Insufficient documentation

## 2022-12-25 DIAGNOSIS — M25519 Pain in unspecified shoulder: Secondary | ICD-10-CM | POA: Diagnosis not present

## 2022-12-25 DIAGNOSIS — R2231 Localized swelling, mass and lump, right upper limb: Secondary | ICD-10-CM | POA: Insufficient documentation

## 2022-12-25 DIAGNOSIS — M25552 Pain in left hip: Secondary | ICD-10-CM | POA: Diagnosis not present

## 2022-12-25 DIAGNOSIS — E039 Hypothyroidism, unspecified: Secondary | ICD-10-CM | POA: Insufficient documentation

## 2022-12-25 DIAGNOSIS — M19011 Primary osteoarthritis, right shoulder: Secondary | ICD-10-CM | POA: Diagnosis not present

## 2022-12-25 DIAGNOSIS — I672 Cerebral atherosclerosis: Secondary | ICD-10-CM | POA: Diagnosis not present

## 2022-12-25 DIAGNOSIS — I6782 Cerebral ischemia: Secondary | ICD-10-CM | POA: Insufficient documentation

## 2022-12-25 DIAGNOSIS — I700 Atherosclerosis of aorta: Secondary | ICD-10-CM | POA: Diagnosis not present

## 2022-12-25 DIAGNOSIS — M5030 Other cervical disc degeneration, unspecified cervical region: Secondary | ICD-10-CM | POA: Diagnosis not present

## 2022-12-25 DIAGNOSIS — Y92002 Bathroom of unspecified non-institutional (private) residence single-family (private) house as the place of occurrence of the external cause: Secondary | ICD-10-CM | POA: Diagnosis not present

## 2022-12-25 DIAGNOSIS — S199XXA Unspecified injury of neck, initial encounter: Secondary | ICD-10-CM | POA: Diagnosis not present

## 2022-12-25 DIAGNOSIS — M79603 Pain in arm, unspecified: Secondary | ICD-10-CM | POA: Diagnosis not present

## 2022-12-25 DIAGNOSIS — M25511 Pain in right shoulder: Secondary | ICD-10-CM | POA: Diagnosis not present

## 2022-12-25 DIAGNOSIS — R9431 Abnormal electrocardiogram [ECG] [EKG]: Secondary | ICD-10-CM | POA: Diagnosis not present

## 2022-12-25 DIAGNOSIS — S0990XA Unspecified injury of head, initial encounter: Secondary | ICD-10-CM | POA: Diagnosis not present

## 2022-12-25 DIAGNOSIS — I10 Essential (primary) hypertension: Secondary | ICD-10-CM | POA: Diagnosis not present

## 2022-12-25 LAB — COMPREHENSIVE METABOLIC PANEL
ALT: 18 U/L (ref 0–44)
AST: 25 U/L (ref 15–41)
Albumin: 3.6 g/dL (ref 3.5–5.0)
Alkaline Phosphatase: 32 U/L — ABNORMAL LOW (ref 38–126)
Anion gap: 15 (ref 5–15)
BUN: 27 mg/dL — ABNORMAL HIGH (ref 8–23)
CO2: 26 mmol/L (ref 22–32)
Calcium: 8.6 mg/dL — ABNORMAL LOW (ref 8.9–10.3)
Chloride: 99 mmol/L (ref 98–111)
Creatinine, Ser: 1.34 mg/dL — ABNORMAL HIGH (ref 0.44–1.00)
GFR, Estimated: 37 mL/min — ABNORMAL LOW (ref >60)
Glucose, Bld: 111 mg/dL — ABNORMAL HIGH (ref 70–99)
Potassium: 3.4 mmol/L — ABNORMAL LOW (ref 3.5–5.1)
Sodium: 140 mmol/L (ref 135–145)
Total Bilirubin: 0.5 mg/dL (ref 0.3–1.2)
Total Protein: 6.9 g/dL (ref 6.5–8.1)

## 2022-12-25 LAB — CBC WITH DIFFERENTIAL/PLATELET
Abs Immature Granulocytes: 0.01 10*3/uL (ref 0.00–0.07)
Basophils Absolute: 0.1 10*3/uL (ref 0.0–0.1)
Basophils Relative: 2 %
Eosinophils Absolute: 0.1 10*3/uL (ref 0.0–0.5)
Eosinophils Relative: 1 %
HCT: 33.5 % — ABNORMAL LOW (ref 36.0–46.0)
Hemoglobin: 10.6 g/dL — ABNORMAL LOW (ref 12.0–15.0)
Immature Granulocytes: 0 %
Lymphocytes Relative: 20 %
Lymphs Abs: 1 10*3/uL (ref 0.7–4.0)
MCH: 29.9 pg (ref 26.0–34.0)
MCHC: 31.6 g/dL (ref 30.0–36.0)
MCV: 94.4 fL (ref 80.0–100.0)
Monocytes Absolute: 0.4 10*3/uL (ref 0.1–1.0)
Monocytes Relative: 7 %
Neutro Abs: 3.7 10*3/uL (ref 1.7–7.7)
Neutrophils Relative %: 70 %
Platelets: 278 10*3/uL (ref 150–400)
RBC: 3.55 MIL/uL — ABNORMAL LOW (ref 3.87–5.11)
RDW: 12.9 % (ref 11.5–15.5)
WBC: 5.2 10*3/uL (ref 4.0–10.5)
nRBC: 0 % (ref 0.0–0.2)

## 2022-12-25 LAB — TSH: TSH: 4.533 u[IU]/mL — ABNORMAL HIGH (ref 0.350–4.500)

## 2022-12-25 LAB — CK: Total CK: 134 U/L (ref 38–234)

## 2022-12-25 NOTE — Discharge Instructions (Signed)
Follow-up with your doctor to have your thyroid medication adjusted.  Otherwise workup for the fall without any acute findings.

## 2022-12-25 NOTE — ED Provider Notes (Incomplete Revision)
[place] EMERGENCY DEPARTMENT AT [facility] Provider Note   CSN: [id number] Arrival date & time: 12/25/22  1111     History  Chief Complaint  Patient presents with  . Fall    [name] [name] is a 89 y.o. female.  Patient brought in by nurse from home.  Patient lives by herself but is checked on frequently.  At 1:00 in the morning she was fine.  Family member came in to prepare breakfast for her at around 930.  So they estimate the fall occurred between 1 in the morning and 9:30 in the morning.  But they stated that most likely occurred around 730 because as when she gets up to go to the bathroom.  Patient was found on the floor in the bathroom.  Patient is not on any blood thinners.  Patient denies any injuries.  However there is a mark on her right shoulder.  Patient is very hard of hearing.  Patient has a history of frequent falls.  So that parts not unusual.  Patient last seen in the emergency department a year ago in July for generalized weakness.  Patient in the fall 2022 had a subdural hematoma.  Past medical history significant for hypothyroidism postsurgical for malignant neoplasm of thyroid in 2008 hyperlipidemia chronic kidney disease stage III.  Patient never used tobacco products.      Home Medications Prior to Admission medications   Medication Sig Start Date End Date Taking? Authorizing Provider  calcitRIOL (ROCALTROL) 0.5 MCG capsule Take 1 capsule by mouth once daily 08/31/22   Shamleffer, Konrad Dolores, MD  cephALEXin (KEFLEX) 500 MG capsule Take 1 capsule (500 mg total) by mouth 2 (two) times daily after a meal. 10/13/22   Hudnell, Judeth Cornfield, NP  docusate sodium (COLACE) 100 MG capsule Take 100 mg by mouth at bedtime.     [provider]  furosemide (LASIX) 40 MG tablet Take 1 tablet (40 mg total) by mouth 2 (two) times daily. Please schedule 6 month follow up with Dr. [name] for further refills [phone] 10/05/22   Willow Ora, MD   gabapentin (NEURONTIN) 100 MG capsule Take 1 capsule by mouth at bedtime 10/05/22   Ardith Dark, MD  guaiFENesin (MUCINEX) 600 MG 12 hr tablet Take by mouth 2 (two) times daily.    [provider]  levothyroxine (SYNTHROID) 88 MCG tablet Take 1 tablet (88 mcg total) by mouth daily. 08/22/22   Shamleffer, Konrad Dolores, MD  triamcinolone ointment (KENALOG) 0.5 % Apply 1 Application topically 2 (two) times daily. as needed to affected area. 09/08/22   Ardith Dark, MD  valACYclovir (VALTREX) 1000 MG tablet Take 1 tablet (1,000 mg total) by mouth daily. Take 1 tablet daily for 5 days as needed for outbreaks 01/17/22   Ardith Dark, MD      Allergies    Verapamil    Review of Systems   Review of Systems  Constitutional:  Negative for chills and fever.  HENT:  Positive for hearing loss. Negative for ear pain and sore throat.   Eyes:  Negative for pain and visual disturbance.  Respiratory:  Negative for cough and shortness of breath.   Cardiovascular:  Negative for chest pain and palpitations.  Gastrointestinal:  Negative for abdominal pain and vomiting.  Genitourinary:  Negative for dysuria and hematuria.  Musculoskeletal:  Negative for arthralgias and back pain.  Skin:  Negative for color change and rash.  Neurological:  Negative for seizures and syncope.  All other systems reviewed and are negative.   Physical Exam Updated Vital Signs BP (!) 174/87 (BP Location: Right Arm)   Pulse 81   Temp 98.1 F (36.7 C) (Oral)   Resp 16   Ht 1.6 m (5\' 3" )   Wt 51 kg   SpO2 100%   BMI 19.92 kg/m  Physical Exam Vitals and nursing note reviewed.  Constitutional:      General: She is not in acute distress.    Appearance: Normal appearance. She is well-developed. She is not ill-appearing.  HENT:     Head: Normocephalic and atraumatic.     Mouth/Throat:     Mouth: Mucous membranes are moist.  Eyes:     Conjunctiva/sclera: Conjunctivae normal.  Neck:     Comments:  Questionable posterior tenderness at the base. Cardiovascular:     Rate and Rhythm: Normal rate and regular rhythm.     Heart sounds: No murmur heard. Pulmonary:     Effort: Pulmonary effort is normal. No respiratory distress.     Breath sounds: Normal breath sounds.  Abdominal:     Palpations: Abdomen is soft.     Tenderness: There is no abdominal tenderness.  Musculoskeletal:        General: No swelling.     Cervical back: Neck supple. Tenderness present.     Right lower leg: No edema.     Left lower leg: No edema.     Comments: Little bit of swelling and some skin impressions around the right shoulder.  No obvious deformity.  Distally cap refills are good in both upper extremities and lower extremities.  Able to move both lower extremities on her own without any significant pain no obvious deformity or shortening.  Skin:    General: Skin is warm and dry.     Capillary Refill: Capillary refill takes less than 2 seconds.  Neurological:     Mental Status: She is alert. Mental status is at baseline.     Sensory: No sensory deficit.     Motor: No weakness.  Psychiatric:        Mood and Affect: Mood normal.    ED Results / Procedures / Treatments   Labs (all labs ordered are listed, but only abnormal results are displayed) Labs Reviewed  CBC WITH DIFFERENTIAL/PLATELET  COMPREHENSIVE METABOLIC PANEL  URINALYSIS, ROUTINE W REFLEX MICROSCOPIC  TSH  CK    EKG EKG Interpretation Date/Time:  Monday December 25 2022 12:08:15 EDT Ventricular Rate:  84 PR Interval:  156 QRS Duration:  136 QT Interval:  448 QTC Calculation: 452 R Axis:   -34  Text Interpretation: Sinus rhythm Ventricular bigeminy Left bundle branch block Confirmed by Vanetta Mulders [phone]) on 12/25/2022 12:10:20 PM  Radiology No results found.  Procedures Procedures    Medications Ordered in ED Medications - No data to display  ED Course/ Medical Decision Making/ A&P                                  Medical Decision Making Amount and/or Complexity of Data Reviewed Labs: ordered. Radiology: ordered.   Patient with history of falls.  Will get CK will get x-ray of the right shoulder will get CT head and CT neck.  And we will go ahead and get x-rays of pelvis and both hips.  Just to make sure everything is okay.  Will also check basic labs.   Final Clinical Impression(s) /  ED Diagnoses Final diagnoses:  Fall, initial encounter    Rx / DC Orders ED Discharge Orders     None         Vanetta Mulders, MD 12/25/22 1226

## 2022-12-25 NOTE — ED Triage Notes (Signed)
Pt to the ed from home via EMS with a CC of fall. Pt was found in the bathroom  and is unsure how long she was on the ground. Pt denies hitting head, loc, or blood thinners. Pt is extremely heard of hearing. Pt has marking on her right shoulder, denies pain at this time.

## 2022-12-25 NOTE — ED Notes (Signed)
Awaiting patient from EMS triage

## 2022-12-25 NOTE — ED Provider Notes (Signed)
[place] EMERGENCY DEPARTMENT AT [facility] Provider Note   CSN: [id number] Arrival date & time: 12/25/22  1111     History  Chief Complaint  Patient presents with   [name] [name] is a 89 y.o. female.  Patient brought in by nurse from home.  Patient lives by herself but is checked on frequently.  At 1:00 in the morning she was fine.  Family member came in to prepare breakfast for her at around 930.  So they estimate the fall occurred between 1 in the morning and 9:30 in the morning.  But they stated that most likely occurred around 730 because as when she gets up to go to the bathroom.  Patient was found on the floor in the bathroom.  Patient is not on any blood thinners.  Patient denies any injuries.  However there is a mark on her right shoulder.  Patient is very hard of hearing.  Patient has a history of frequent falls.  So that parts not unusual.  Patient last seen in the emergency department a year ago in July for generalized weakness.  Patient in the fall 2022 had a subdural hematoma.  Past medical history significant for hypothyroidism postsurgical for malignant neoplasm of thyroid in 2008 hyperlipidemia chronic kidney disease stage III.  Patient never used tobacco products.       Home Medications Prior to Admission medications   Medication Sig Start Date End Date Taking? Authorizing Provider  calcitRIOL (ROCALTROL) 0.5 MCG capsule Take 1 capsule by mouth once daily 08/31/22   Shamleffer, Konrad Dolores, MD  cephALEXin (KEFLEX) 500 MG capsule Take 1 capsule (500 mg total) by mouth 2 (two) times daily after a meal. 10/13/22   Hudnell, Judeth Cornfield, NP  docusate sodium (COLACE) 100 MG capsule Take 100 mg by mouth at bedtime.     [provider]  furosemide (LASIX) 40 MG tablet Take 1 tablet (40 mg total) by mouth 2 (two) times daily. Please schedule 6 month follow up with Dr. [name] for further refills [phone] 10/05/22   Willow Ora, MD   gabapentin (NEURONTIN) 100 MG capsule Take 1 capsule by mouth at bedtime 10/05/22   Ardith Dark, MD  guaiFENesin (MUCINEX) 600 MG 12 hr tablet Take by mouth 2 (two) times daily.    [provider]  levothyroxine (SYNTHROID) 88 MCG tablet Take 1 tablet (88 mcg total) by mouth daily. 08/22/22   Shamleffer, Konrad Dolores, MD  triamcinolone ointment (KENALOG) 0.5 % Apply 1 Application topically 2 (two) times daily. as needed to affected area. 09/08/22   Ardith Dark, MD  valACYclovir (VALTREX) 1000 MG tablet Take 1 tablet (1,000 mg total) by mouth daily. Take 1 tablet daily for 5 days as needed for outbreaks 01/17/22   Ardith Dark, MD      Allergies    Verapamil    Review of Systems   Review of Systems  Constitutional:  Negative for chills and fever.  HENT:  Positive for hearing loss. Negative for ear pain and sore throat.   Eyes:  Negative for pain and visual disturbance.  Respiratory:  Negative for cough and shortness of breath.   Cardiovascular:  Negative for chest pain and palpitations.  Gastrointestinal:  Negative for abdominal pain and vomiting.  Genitourinary:  Negative for dysuria and hematuria.  Musculoskeletal:  Negative for arthralgias and back pain.  Skin:  Negative for color change and rash.  Neurological:  Negative for seizures and syncope.  All other systems reviewed and are negative.   Physical Exam Updated Vital Signs BP (!) 174/87 (BP Location: Right Arm)   Pulse 81   Temp 98.1 F (36.7 C) (Oral)   Resp 16   Ht 1.6 m (5\' 3" )   Wt 51 kg   SpO2 100%   BMI 19.92 kg/m  Physical Exam Vitals and nursing note reviewed.  Constitutional:      General: She is not in acute distress.    Appearance: Normal appearance. She is well-developed. She is not ill-appearing.  HENT:     Head: Normocephalic and atraumatic.     Mouth/Throat:     Mouth: Mucous membranes are moist.  Eyes:     Conjunctiva/sclera: Conjunctivae normal.  Neck:     Comments:  Questionable posterior tenderness at the base. Cardiovascular:     Rate and Rhythm: Normal rate and regular rhythm.     Heart sounds: No murmur heard. Pulmonary:     Effort: Pulmonary effort is normal. No respiratory distress.     Breath sounds: Normal breath sounds.  Abdominal:     Palpations: Abdomen is soft.     Tenderness: There is no abdominal tenderness.  Musculoskeletal:        General: No swelling.     Cervical back: Neck supple. Tenderness present.     Right lower leg: No edema.     Left lower leg: No edema.     Comments: Little bit of swelling and some skin impressions around the right shoulder.  No obvious deformity.  Distally cap refills are good in both upper extremities and lower extremities.  Able to move both lower extremities on her own without any significant pain no obvious deformity or shortening.  Skin:    General: Skin is warm and dry.     Capillary Refill: Capillary refill takes less than 2 seconds.  Neurological:     Mental Status: She is alert. Mental status is at baseline.     Sensory: No sensory deficit.     Motor: No weakness.  Psychiatric:        Mood and Affect: Mood normal.     ED Results / Procedures / Treatments   Labs (all labs ordered are listed, but only abnormal results are displayed) Labs Reviewed  CBC WITH DIFFERENTIAL/PLATELET  COMPREHENSIVE METABOLIC PANEL  URINALYSIS, ROUTINE W REFLEX MICROSCOPIC  TSH  CK    EKG EKG Interpretation Date/Time:  Monday December 25 2022 12:08:15 EDT Ventricular Rate:  84 PR Interval:  156 QRS Duration:  136 QT Interval:  448 QTC Calculation: 452 R Axis:   -34  Text Interpretation: Sinus rhythm Ventricular bigeminy Left bundle branch block Confirmed by Vanetta Mulders [phone]) on 12/25/2022 12:10:20 PM  Radiology No results found.  Procedures Procedures    Medications Ordered in ED Medications - No data to display  ED Course/ Medical Decision Making/ A&P                                  Medical Decision Making Amount and/or Complexity of Data Reviewed Labs: ordered. Radiology: ordered.   Patient with history of falls.  Will get CK will get x-ray of the right shoulder will get CT head and CT neck.  And we will go ahead and get x-rays of pelvis and both hips.  Just to make sure everything is okay.  Will also check basic labs.   Final Clinical Impression(s) /  ED Diagnoses Final diagnoses:  Fall, initial encounter    Rx / DC Orders ED Discharge Orders     None         Vanetta Mulders, MD 12/25/22 1226

## 2022-12-30 ENCOUNTER — Other Ambulatory Visit: Payer: Self-pay | Admitting: Family Medicine

## 2023-01-26 ENCOUNTER — Telehealth: Payer: Self-pay | Admitting: *Deleted

## 2023-01-26 NOTE — Telephone Encounter (Signed)
Transition Care Management Follow-up Telephone Call Date of discharge and from where: [facility]  12/25/2022 How have you been since you were released from the hospital? She doing fine just want to verify no broken bones  Any questions or concerns? Yes , patient requested diaper change after a day of sitting waiting however did not get changed until ready to dc   Items Reviewed: Did the pt receive and understand the discharge instructions provided? No Medications obtained and verified? No  Other? No  Any new allergies since your discharge? No  Dietary orders reviewed? No Do you have support at home? No      Follow up appointments reviewed:  PCP Hospital f/u appt confirmed? No  Went to hospital in case she broke anything she did not feel PCP visit needed  . Are transportation arrangements needed? No  issues with transportation and medicine  If their condition worsens, is the pt aware to call PCP or go to the Emergency Dept.? Yes Was the patient provided with contact information for the PCP's office or ED? Yes Was to pt encouraged to call back with questions or concerns? Yes

## 2023-02-06 ENCOUNTER — Other Ambulatory Visit: Payer: Self-pay | Admitting: Family Medicine

## 2023-02-15 ENCOUNTER — Other Ambulatory Visit: Payer: Self-pay | Admitting: Family Medicine

## 2023-02-21 ENCOUNTER — Ambulatory Visit: Payer: PPO | Admitting: Internal Medicine

## 2023-02-26 ENCOUNTER — Other Ambulatory Visit: Payer: Self-pay | Admitting: Family Medicine

## 2023-02-27 ENCOUNTER — Other Ambulatory Visit: Payer: Self-pay | Admitting: Internal Medicine

## 2023-02-27 ENCOUNTER — Ambulatory Visit: Payer: PPO | Admitting: Physician Assistant

## 2023-03-07 ENCOUNTER — Encounter: Payer: Self-pay | Admitting: Emergency Medicine

## 2023-03-07 ENCOUNTER — Ambulatory Visit
Admission: EM | Admit: 2023-03-07 | Discharge: 2023-03-07 | Disposition: A | Discharge status: Home / Self Care | Payer: PPO | Attending: Nurse Practitioner | Admitting: Nurse Practitioner

## 2023-03-07 ENCOUNTER — Telehealth: Payer: Self-pay | Admitting: Emergency Medicine

## 2023-03-07 ENCOUNTER — Ambulatory Visit: Payer: PPO

## 2023-03-07 ENCOUNTER — Other Ambulatory Visit: Payer: Self-pay

## 2023-03-07 DIAGNOSIS — I700 Atherosclerosis of aorta: Secondary | ICD-10-CM | POA: Diagnosis not present

## 2023-03-07 DIAGNOSIS — R053 Chronic cough: Secondary | ICD-10-CM | POA: Diagnosis not present

## 2023-03-07 DIAGNOSIS — I517 Cardiomegaly: Secondary | ICD-10-CM | POA: Diagnosis not present

## 2023-03-07 MED ORDER — DEBROX 6.5 % OT SOLN: 5.0000 [drp] | Freq: Two times a day (BID) | OTIC | 0 refills | Status: DC

## 2023-03-07 NOTE — Telephone Encounter (Signed)
Provider reported to contact pt and discuss negative chest xray result. Pt family verbalized understanding of result.

## 2023-03-07 NOTE — ED Triage Notes (Addendum)
Pt family reports pt has had cough for "awhile" with "white mucous that won't come up."

## 2023-03-07 NOTE — Discharge Instructions (Addendum)
Chest x-ray is pending.  You will be contacted if the chest x-ray result is abnormal. Apply eardrops as prescribed. May take over-the-counter Tylenol as needed for pain, fever, or general discomfort. Continue her current medications to include Tessalon, and fluticasone nasal spray. Recommend using a humidifier in her bedroom at nighttime during sleep and having her sleep slightly elevated on pillows. After you have applied the earwax softener for at least 1 week, you may follow-up in this clinic to attempt ear irrigation, or either with her PCP. Follow-up with PCP within the next 1 to 2 weeks for reevaluation. Follow-up as needed.

## 2023-03-07 NOTE — ED Provider Notes (Signed)
[facility]    CSN: [id number] Arrival date & time: 03/07/23  1406      History   Chief Complaint Chief Complaint  Patient presents with   Cough    HPI [name] [name] is a 89 y.o. female.   The history is provided by the patient.   Patient presents for complaints of cough with her family.  Family member reports cough is been present for several years.  Family member reports patient does have a history of chronic sinusitis.  Patient is currently taking Tessalon and using Flonase for her symptoms.  Family denies fever, chills, wheezing, difficulty breathing, chest pain, abdominal pain, nausea, vomiting, or diarrhea.  Patient states that she is spitting up a lot of white mucus.  Past Medical History:  Diagnosis Date   ALLERGIC RHINITIS 11/02/2006   CKD (chronic kidney disease), stage III (HCC)    HYPERLIPIDEMIA 11/04/2006   HYPOTHYROIDISM, POSTSURGICAL 03/28/2007   Malignant neoplasm of thyroid gland (HCC) 03/28/2007   5/07: righte lobectomy--path shows 3 cm follicular ca 8/07: completion left lobectomy, no more tumor seen 09/07: thyroglobulin 0.2 9ab neg) 11/07: i-131 rx, 101 mci 6/08: thyroglogulin 0.2 (ab neg) , hypothyroid body scan neg (clinically tolerated hypothyroidism poorly) 06/09: tg undetectable (ab=1.5, normal) 12/10: tg undetectable (ab neg) 6/11: tg undectable (ab neg)   OSTEOPENIA 11/02/2006    Patient Active Problem List   Diagnosis Date Noted   Osteoarthritis 01/17/2022   Pulmonary nodule 11/14/2021   Generalized weakness 10/17/2021   Pulmonary embolism (HCC) 10/17/2021   Essential hypertension 01/11/2021   Leg edema 12/01/2020   Plantar flexed metatarsal bone of left foot 11/24/2020   Plantar flexed metatarsal bone of right foot 11/24/2020   Chronic sinusitis 07/17/2019   Right leg weakness 10/15/2018   Corns and callus 09/25/2018   Shingles 04/05/2018   HLD (hyperlipidemia) 08/05/2017   CKD (chronic kidney disease), stage III (HCC)  08/05/2017   Numbness 11/19/2014   Malignant neoplasm of thyroid gland (HCC) 03/28/2007   HYPOTHYROIDISM, POSTSURGICAL 03/28/2007   HYPOPARATHYROIDISM 03/28/2007   Allergic rhinitis 11/02/2006   Disorder of bone and cartilage 11/02/2006    Past Surgical History:  Procedure Laterality Date   ABDOMINAL HYSTERECTOMY     CERVICAL POLYPECTOMY     OPEN REDUCTION INTERNAL FIXATION (ORIF) DISTAL RADIAL FRACTURE Left 01/14/2014   Procedure: OPEN REDUCTION INTERNAL FIXATION DISTAL RADIAL FRACTURE;  Surgeon: Sharma Covert, MD;  Location: [facility] OR;  Service: Orthopedics;  Laterality: Left;    OB History   No obstetric history on file.      Home Medications    Prior to Admission medications   Medication Sig Start Date End Date Taking? Authorizing Provider  carbamide peroxide (DEBROX) 6.5 % OTIC solution Place 5 drops into both ears 2 (two) times daily. 03/07/23  Yes Leath-Warren, Sadie Haber, NP  calcitRIOL (ROCALTROL) 0.5 MCG capsule Take 1 capsule by mouth once daily 02/27/23   Shamleffer, Konrad Dolores, MD  docusate sodium (COLACE) 100 MG capsule Take 100 mg by mouth at bedtime.     [provider]  furosemide (LASIX) 40 MG tablet Take 1 tablet (40 mg total) by mouth 2 (two) times daily. Please schedule 6 month follow up with Dr. [name] for further refills [phone] 10/05/22   Willow Ora, MD  gabapentin (NEURONTIN) 100 MG capsule Take 1 capsule by mouth at bedtime 01/01/23   Ardith Dark, MD  guaiFENesin (MUCINEX) 600 MG 12 hr tablet Take by mouth 2 (two) times  daily. Patient not taking: Reported on 12/25/2022    [provider]  levothyroxine (SYNTHROID) 88 MCG tablet Take 1 tablet (88 mcg total) by mouth daily. 08/22/22   Shamleffer, Konrad Dolores, MD  triamcinolone ointment (KENALOG) 0.5 % Apply 1 Application topically 2 (two) times daily. as needed to affected area. 09/08/22   Ardith Dark, MD    Family History Family History  Problem Relation Age of Onset    Diabetes Sister     Social History Social History   Tobacco Use   Smoking status: Never   Smokeless tobacco: Never  Vaping Use   Vaping status: Never Used  Substance Use Topics   Alcohol use: Not Currently   Drug use: Never     Allergies   Verapamil   Review of Systems Review of Systems Per HPI  Physical Exam Triage Vital Signs ED Triage Vitals [03/07/23 1534]  Encounter Vitals Group     BP (!) 185/93     Systolic BP Percentile      Diastolic BP Percentile      Pulse Rate 81     Resp 20     Temp 98.2 F (36.8 C)     Temp Source Oral     SpO2 95 %     Weight      Height      Head Circumference      Peak Flow      Pain Score 0     Pain Loc      Pain Education      Exclude from Growth Chart    No data found.  Updated Vital Signs BP (!) 185/93 (BP Location: Right Arm)   Pulse 81   Temp 98.2 F (36.8 C) (Oral)   Resp 20   SpO2 95%   Visual Acuity Right Eye Distance:   Left Eye Distance:   Bilateral Distance:    Right Eye Near:   Left Eye Near:    Bilateral Near:     Physical Exam Vitals and nursing note reviewed.  Constitutional:      General: She is not in acute distress.    Appearance: Normal appearance.  HENT:     Head: Normocephalic.     Right Ear: Ear canal and external ear normal. There is impacted cerumen.     Left Ear: Ear canal and external ear normal. There is impacted cerumen.     Ears:     Comments: Attempted ear irrigation bilaterally, patient unable to tolerate.    Nose: Nose normal.     Mouth/Throat:     Mouth: Mucous membranes are moist.     Pharynx: No posterior oropharyngeal erythema.     Comments: Cobblestoning present to posterior oropharynx  Eyes:     Extraocular Movements: Extraocular movements intact.     Conjunctiva/sclera: Conjunctivae normal.     Pupils: Pupils are equal, round, and reactive to light.  Cardiovascular:     Rate and Rhythm: Normal rate and regular rhythm.     Pulses: Normal pulses.      Heart sounds: Normal heart sounds.  Pulmonary:     Effort: Pulmonary effort is normal. No respiratory distress.     Breath sounds: Normal breath sounds. No stridor. No wheezing, rhonchi or rales.  Abdominal:     General: Bowel sounds are normal.     Palpations: Abdomen is soft.     Tenderness: There is no abdominal tenderness.  Musculoskeletal:     Cervical back: Normal range  of motion.  Lymphadenopathy:     Cervical: No cervical adenopathy.  Skin:    General: Skin is warm and dry.  Neurological:     General: No focal deficit present.     Mental Status: She is alert and oriented to person, place, and time.  Psychiatric:        Mood and Affect: Mood normal.        Behavior: Behavior normal.      UC Treatments / Results  Labs (all labs ordered are listed, but only abnormal results are displayed) Labs Reviewed - No data to display  EKG   Radiology No results found.  Procedures Procedures (including critical care time)  Medications Ordered in UC Medications - No data to display  Initial Impression / Assessment and Plan / UC Course  I have reviewed the triage vital signs and the nursing notes.  Pertinent labs & imaging results that were available during my care of the patient were reviewed by me and considered in my medical decision making (see chart for details).  Chest x-ray is pending.  Family was advised we will be contacted if the chest x-ray result is abnormal.  Attempted bilateral ear irrigation; however, patient was unable to tolerate.  For patient's cough, recommend over-the-counter Robitussin DM and for the cerumen impaction, Debrox earwax softener was prescribed.  Supportive care recommendations were provided and discussed with the patient's family to include over-the-counter Tylenol, use of a humidifier in her bedroom at nighttime during sleep, and having her sleep slightly elevated on pillows while symptoms persist.  Family was advised to follow-up with her PCP  to discuss ear irrigation after use of the earwax softener.  Family was in agreement with this plan of care and verbalized understanding.  All questions were answered.  Patient stable for discharge.  Final Clinical Impressions(s) / UC Diagnoses   Final diagnoses:  Chronic cough     Discharge Instructions      Chest x-ray is pending.  You will be contacted if the chest x-ray result is abnormal. Apply eardrops as prescribed. May take over-the-counter Tylenol as needed for pain, fever, or general discomfort. Continue her current medications to include Tessalon, and fluticasone nasal spray. Recommend using a humidifier in her bedroom at nighttime during sleep and having her sleep slightly elevated on pillows. After you have applied the earwax softener for at least 1 week, you may follow-up in this clinic to attempt ear irrigation, or either with her PCP. Follow-up with PCP within the next 1 to 2 weeks for reevaluation. Follow-up as needed.     ED Prescriptions     Medication Sig Dispense Auth. Provider   carbamide peroxide (DEBROX) 6.5 % OTIC solution Place 5 drops into both ears 2 (two) times daily. 15 mL Leath-Warren, Sadie Haber, NP      PDMP not reviewed this encounter.   Abran Cantor, NP 03/07/23 1635

## 2023-03-17 ENCOUNTER — Encounter: Payer: Self-pay | Admitting: Family Medicine

## 2023-03-19 ENCOUNTER — Other Ambulatory Visit: Payer: Self-pay | Admitting: *Deleted

## 2023-03-19 MED ORDER — TRIAMCINOLONE ACETONIDE 0.5 % EX OINT: 1.0000 | TOPICAL_OINTMENT | Freq: Two times a day (BID) | CUTANEOUS | 0 refills | Status: DC

## 2023-03-19 MED ORDER — VALACYCLOVIR HCL 1 G PO TABS: 1000.0000 mg | ORAL_TABLET | Freq: Every day | ORAL | 5 refills | Status: DC

## 2023-03-19 NOTE — Telephone Encounter (Signed)
Please advise 

## 2023-03-19 NOTE — Telephone Encounter (Signed)
Ok to refill.  [name] [name]. [name], MD 03/19/2023 12:51 PM

## 2023-04-02 ENCOUNTER — Ambulatory Visit: Payer: PPO | Admitting: Internal Medicine

## 2023-04-17 ENCOUNTER — Ambulatory Visit: Payer: PPO | Admitting: Internal Medicine

## 2023-06-05 ENCOUNTER — Ambulatory Visit (INDEPENDENT_AMBULATORY_CARE_PROVIDER_SITE_OTHER): Payer: PPO | Admitting: Family Medicine

## 2023-06-05 ENCOUNTER — Encounter: Payer: Self-pay | Admitting: Physician Assistant

## 2023-06-05 ENCOUNTER — Encounter: Payer: Self-pay | Admitting: Family Medicine

## 2023-06-05 VITALS — BP 132/80 | HR 87 | Temp 98.4°F | Ht 63.0 in | Wt 118.2 lb

## 2023-06-05 DIAGNOSIS — G4700 Insomnia, unspecified: Secondary | ICD-10-CM | POA: Insufficient documentation

## 2023-06-05 DIAGNOSIS — N390 Urinary tract infection, site not specified: Secondary | ICD-10-CM | POA: Diagnosis not present

## 2023-06-05 DIAGNOSIS — R4189 Other symptoms and signs involving cognitive functions and awareness: Secondary | ICD-10-CM | POA: Insufficient documentation

## 2023-06-05 DIAGNOSIS — E890 Postprocedural hypothyroidism: Secondary | ICD-10-CM

## 2023-06-05 MED ORDER — QUETIAPINE FUMARATE 50 MG PO TABS
50.0000 mg | ORAL_TABLET | Freq: Every day | ORAL | 3 refills | Status: DC
Start: 2023-06-05 — End: 2023-08-21

## 2023-06-05 MED ORDER — TRIAMCINOLONE ACETONIDE 0.5 % EX OINT
1.0000 | TOPICAL_OINTMENT | Freq: Two times a day (BID) | CUTANEOUS | 0 refills | Status: DC
Start: 2023-06-05 — End: 2023-08-21

## 2023-06-05 MED ORDER — FUROSEMIDE 40 MG PO TABS: 40.0000 mg | ORAL_TABLET | Freq: Every day | ORAL | 3 refills | Status: DC | PRN

## 2023-06-05 NOTE — Assessment & Plan Note (Addendum)
 Likely has underlying dementia.  Had extensive discussion with patient and her family today regarding her recent symptoms.  Overall her symptoms are stable and mostly manageable however more recently she is having more confusion, memory lapses, insomnia, and paranoia.  We are addressing insomnia as below.  Will be checking labs today to look for potential reversible causes.  Will also refer to neurology for formal dementia evaluation.  They are potentially interested in memory care placement at this time.  Will place referral to social work to help with this.  We discussed reasons to return to care.

## 2023-06-05 NOTE — Assessment & Plan Note (Signed)
 Symptoms have worsened significantly recently.  We are checking labs as above to rule out other organic etiologies.  She likely does have underlying dementia and sounds like she is probably having some sundowning.  Family did ask about potential medications to help her with her sleep.  We did discuss potential options.  Given her nighttime symptoms including wandering off and leaving the house as well as paranoid delusions would be reasonable for [place] to start an antipsychotic at this point to help manage her symptoms and also to help with safety as well.  We did discuss potential side effects.  Will start Seroquel 50 mg nightly.  Will follow-up with [place] in a few weeks and we can adjust as needed.  We are pursuing dementia workup as above.

## 2023-06-05 NOTE — Assessment & Plan Note (Signed)
 Check TSH.  On Synthroid 88 mcg daily.

## 2023-06-05 NOTE — Progress Notes (Addendum)
 [name] [name] is a 89 y.o. female who presents today for an office visit.  Assessment/Plan:  Chronic Problems Addressed Today: Cognitive impairment Likely has underlying dementia.  Had extensive discussion with patient and her family today regarding her recent symptoms.  Overall her symptoms are stable and mostly manageable however more recently she is having more confusion, memory lapses, insomnia, and paranoia.  We are addressing insomnia as below.  Will be checking labs today to look for potential reversible causes.  Will also refer to neurology for formal dementia evaluation.  They are potentially interested in memory care placement at this time.  Will place referral to social work to help with this.  We discussed reasons to return to care.  Insomnia Symptoms have worsened significantly recently.  We are checking labs as above to rule out other organic etiologies.  She likely does have underlying dementia and sounds like she is probably having some sundowning.  Family did ask about potential medications to help her with her sleep.  We did discuss potential options.  Given her nighttime symptoms including wandering off and leaving the house as well as paranoid delusions would be reasonable for [place] to start an antipsychotic at this point to help manage her symptoms and also to help with safety as well.  We did discuss potential side effects.  Will start Seroquel 50 mg nightly.  Will follow-up with [place] in a few weeks and we can adjust as needed.  We are pursuing dementia workup as above.  HYPOTHYROIDISM, POSTSURGICAL Check TSH.  On Synthroid 88 mcg daily.     Subjective:  HPI:  A/P for status of chronic conditions.  Patient was last seen her over a year ago.  She is here today with her family.  Main concern today is progressive confusion and insomnia.  This has been progressively worsening for the last several months but seems to be worse in the last few weeks.  She is currently staying with  her granddaughter.  She has noticed more difficulty with falling asleep and staying asleep however has frequent issues with agitation and paranoia at night.  They are concerned about dementia.  She has apparently been evaluated by social work in [facility] and was diagnosed with dementia there.  They are working on finding placement but did need assistance with this.  They do note that she has been having ongoing issues with memory impairment.  She has also having more paranoid behavior issues such as being scared the dentures are in the house.  This typically happens at night.  She does sometimes wander off at night as well.  Sometimes will walk on the house with the doors unlocked.  This is getting worse.  No fevers or chills.  No recent illnesses.  No weakness.  No numbness.  No headache.  No vision changes.  She has impaired hearing at baseline which has not changed recently.  She also does have other inappropriate behavior such as taking close all point gets her present.  She tries to remain independent though does need assistant with several ADLs.       Objective:  Physical Exam: BP 132/80   Pulse 87   Temp 98.4 F (36.9 C) (Temporal)   Ht 5\' 3"  (1.6 m)   Wt 118 lb 3.2 oz (53.6 kg)   SpO2 98%   BMI 20.94 kg/m   Wt Readings from Last 3 Encounters:  06/05/23 118 lb 3.2 oz (53.6 kg)  12/25/22 112 lb 7 oz (  51 kg)  10/13/22 113 lb (51.3 kg)    Gen: No acute distress, resting comfortably CV: Regular rate and rhythm with no murmurs appreciated Pulm: Normal work of breathing, clear to auscultation bilaterally with no crackles, wheezes, or rhonchi Neuro: Grossly normal, moves all extremities Psych: Normal affect and thought content.  Frequent non sequiturs      [name] [name] M. [name], MD 06/05/2023 2:56 PM

## 2023-06-05 NOTE — Patient Instructions (Addendum)
 It was very nice to see you today!  Please try the seroquel.   We will check blood work.   Return in about 4 weeks (around 07/03/2023).   Take care, Dr [name]  PLEASE NOTE:  If you had any lab tests, please let us know if you have not heard back within a few days. You may see your results on mychart before we have a chance to review them but we will give you a call once they are reviewed by [place].   If we ordered any referrals today, please let us know if you have not heard from their office within the next week.   If you had any urgent prescriptions sent in today, please check with the pharmacy within an hour of our visit to make sure the prescription was transmitted appropriately.   Please try these tips to maintain a healthy lifestyle:  Eat at least 3 REAL meals and 1-2 snacks per day.  Aim for no more than 5 hours between eating.  If you eat breakfast, please do so within one hour of getting up.   Each meal should contain half fruits/vegetables, one quarter protein, and one quarter carbs (no bigger than a computer mouse)  Cut down on sweet beverages. This includes juice, soda, and sweet tea.   Drink at least 1 glass of water with each meal and aim for at least 8 glasses per day  Exercise at least 150 minutes every week.

## 2023-06-06 ENCOUNTER — Telehealth: Payer: Self-pay | Admitting: *Deleted

## 2023-06-06 LAB — CBC
HCT: 37.1 % (ref 36.0–46.0)
Hemoglobin: 12.3 g/dL (ref 12.0–15.0)
MCHC: 33 g/dL (ref 30.0–36.0)
MCV: 95.4 fL (ref 78.0–100.0)
Platelets: 273 10*3/uL (ref 150.0–400.0)
RBC: 3.89 Mil/uL (ref 3.87–5.11)
RDW: 13.4 % (ref 11.5–15.5)
WBC: 7.5 10*3/uL (ref 4.0–10.5)

## 2023-06-06 LAB — LIPID PANEL
Cholesterol: 299 mg/dL — ABNORMAL HIGH (ref 0–200)
HDL: 101.6 mg/dL (ref >39.00)
LDL Cholesterol: 186 mg/dL — ABNORMAL HIGH (ref 0–99)
NonHDL: 197.63
Total CHOL/HDL Ratio: 3
Triglycerides: 60 mg/dL (ref 0.0–149.0)
VLDL: 12 mg/dL (ref 0.0–40.0)

## 2023-06-06 LAB — COMPREHENSIVE METABOLIC PANEL
ALT: 13 U/L (ref 0–35)
AST: 25 U/L (ref 0–37)
Albumin: 4.5 g/dL (ref 3.5–5.2)
Alkaline Phosphatase: 27 U/L — ABNORMAL LOW (ref 39–117)
BUN: 34 mg/dL — ABNORMAL HIGH (ref 6–23)
CO2: 30 meq/L (ref 19–32)
Calcium: 9.4 mg/dL (ref 8.4–10.5)
Chloride: 96 meq/L (ref 96–112)
Creatinine, Ser: 1.47 mg/dL — ABNORMAL HIGH (ref 0.40–1.20)
GFR: 30.6 mL/min — ABNORMAL LOW (ref >60.00)
Glucose, Bld: 75 mg/dL (ref 70–99)
Potassium: 4.3 meq/L (ref 3.5–5.1)
Sodium: 137 meq/L (ref 135–145)
Total Bilirubin: 0.5 mg/dL (ref 0.2–1.2)
Total Protein: 7.9 g/dL (ref 6.0–8.3)

## 2023-06-06 LAB — TSH: TSH: 16.95 u[IU]/mL — ABNORMAL HIGH (ref 0.35–5.50)

## 2023-06-06 LAB — VITAMIN B12: Vitamin B-12: 358 pg/mL (ref 211–911)

## 2023-06-06 LAB — HEMOGLOBIN A1C: Hgb A1c MFr Bld: 6.6 % — ABNORMAL HIGH (ref 4.6–6.5)

## 2023-06-06 NOTE — Progress Notes (Signed)
 Complex Care Management Note  Care Guide Note 06/06/2023 Name: ROSE-MARIE HICKLING MRN: [id number] DOB: 20-Jan-1928  Al Pimple is a 89 y.o. year old female who sees Ardith Dark, MD for primary care. I reached out to Al Pimple by phone today to offer complex care management services.  Ms. Yaklin was given information about Complex Care Management services today including:   The Complex Care Management services include support from the care team which includes your Nurse Care Manager, Clinical Social Worker, or Pharmacist.  The Complex Care Management team is here to help remove barriers to the health concerns and goals most important to you. Complex Care Management services are voluntary, and the patient may decline or stop services at any time by request to their care team member.   Complex Care Management Consent Status: Patient agreed to services and verbal consent obtained.   Follow up plan:  Telephone appointment with complex care management team member scheduled for:  06/07/2023  Encounter Outcome:  Patient Scheduled  Burman Nieves, CMA, Care Guide [facility]  [facility], [facility] Guide Direct Dial: [phone]  Fax: [phone] Website: [email or web address]

## 2023-06-07 ENCOUNTER — Ambulatory Visit: Payer: Self-pay | Admitting: Licensed Clinical Social Worker

## 2023-06-07 ENCOUNTER — Encounter: Payer: Self-pay | Admitting: Family Medicine

## 2023-06-07 NOTE — Progress Notes (Signed)
 Her urine culture shows that she probably has UTI.  Recommend starting Macrobid 100 mg twice daily until we get final results back.  We will contact her if we need to make any changes.  Her electrolytes and kidney function is stable.  Does not appear as if she is mildly dehydrated.  Recommend that she try to get plenty of fluids.  This will help with her UTI as well.  Her thyroid level is off.  Please verify that she has been taking her thyroid medication as directed?  Her cholesterol is elevated and Her sugar is also mildly elevated. At her age would not recommend starting any statins or any other medications at this point.   She should work on eating healthy diet with plenty of fruits and vegetables and we can recheck in 6 to 12 months.  The rest of her labs are normal.

## 2023-06-08 ENCOUNTER — Other Ambulatory Visit: Payer: Self-pay | Admitting: Family Medicine

## 2023-06-08 LAB — URINE CULTURE
MICRO NUMBER:: 16126127
SPECIMEN QUALITY:: ADEQUATE

## 2023-06-08 MED ORDER — NITROFURANTOIN MONOHYD MACRO 100 MG PO CAPS: 100.0000 mg | ORAL_CAPSULE | Freq: Two times a day (BID) | ORAL | 0 refills | Status: DC

## 2023-06-08 NOTE — Patient Outreach (Signed)
 Care Coordination   Initial Visit Note   06/07/2023 Name: [name] [name] MRN: [id number] DOB: 02/08/1928  [name] [name] is a 89 y.o. year old female who sees [name] [name], [name], MD for primary care. I spoke with  [name] [name] daughter by phone today.  What matters to the patients health and wellness today?  Level of Care, Caregiver Stress    Goals Addressed             This Visit's Progress    Obtain Supportive Resources-Placement   On track    Activities and task to complete in order to accomplish goals.   Keep all upcoming appointments discussed today Continue with compliance of taking medication prescribed by Doctor Implement healthy coping skills discussed to assist with management of symptoms Follow up with Department of Social Services  Review booklet ''When a loved one need long-term care"          SDOH assessments and interventions completed:  Yes  SDOH Interventions Today    Flowsheet Row Most Recent Value  SDOH Interventions   Food Insecurity Interventions Intervention Not Indicated  Housing Interventions Intervention Not Indicated  Transportation Interventions Intervention Not Indicated  Utilities Interventions Intervention Not Indicated        Care Coordination Interventions:  Yes, provided  Interventions Today    Flowsheet Row Most Recent Value  Chronic Disease   Chronic disease during today's visit Chronic Kidney Disease/End Stage Renal Disease (ESRD)  General Interventions   General Interventions Discussed/Reviewed General Interventions Discussed, Doctor Visits, Level of Care  Doctor Visits Discussed/Reviewed Doctor Visits Discussed, Specialist  [Pt will establish with Neurologist in April 2025]  Level of Care Skilled Nursing Facility, Applications  [Family reports interest in placing pt in LTC NH. LCSW informed of Levels of Care and FL2, provided information via email]  Applications Medicaid, [place]-2  Education Interventions   Applications  Medicaid, [place]-2  Mental Health Interventions   Mental Health Discussed/Reviewed Mental Health Discussed, Coping Strategies  [Pt receives strong support from family, who rotate in providing caregiver responsibilities. Strategies to assist with caregiver stress discussed]  Nutrition Interventions   Nutrition Discussed/Reviewed Nutrition Discussed  Pharmacy Interventions   Pharmacy Dicussed/Reviewed Pharmacy Topics Discussed, Medication Adherence  Safety Interventions   Safety Discussed/Reviewed Safety Discussed       Follow up plan: Follow up call scheduled for 2-4 weeks    Encounter Outcome:  Patient Visit Completed   Jenel Lucks, LCSW [place]  [facility], [facility] Clinical Social Worker Direct Dial: [phone]  Fax: [phone] Website: [email or web address] 5:12 PM

## 2023-06-08 NOTE — Patient Instructions (Signed)
 Visit Information  Thank you for taking time to visit with me today. Please don't hesitate to contact me if I can be of assistance to you.   Following are the goals we discussed today:   Goals Addressed             This Visit's Progress    Obtain Supportive Resources-Placement   On track    Activities and task to complete in order to accomplish goals.   Keep all upcoming appointments discussed today Continue with compliance of taking medication prescribed by Doctor Implement healthy coping skills discussed to assist with management of symptoms Follow up with Department of Social Services  Review booklet ''When a loved one need long-term care"          Our next appointment is by telephone on 4/14 at 11:30 AM  Please call the care guide team at [phone] if you need to cancel or reschedule your appointment.   If you are experiencing a Mental Health or Behavioral Health Crisis or need someone to talk to, please call the Suicide and Crisis Lifeline: 988 call 911   Patient verbalizes understanding of instructions and care plan provided today and agrees to view in MyChart. Active MyChart status and patient understanding of how to access instructions and care plan via MyChart confirmed with patient.     Windy Fast [facility]  [facility], [facility] Clinical Social Worker Direct Dial: [phone]  Fax: [phone] Website: [email or web address] 5:13 PM

## 2023-06-08 NOTE — Progress Notes (Signed)
 Macrobid sent in  [place]. [name], MD 06/08/2023 7:35 AM

## 2023-06-20 ENCOUNTER — Encounter: Payer: Self-pay | Admitting: Internal Medicine

## 2023-06-20 ENCOUNTER — Ambulatory Visit: Payer: PPO | Admitting: Internal Medicine

## 2023-06-20 VITALS — BP 130/80 | HR 78 | Ht 63.0 in | Wt 118.8 lb

## 2023-06-20 DIAGNOSIS — E559 Vitamin D deficiency, unspecified: Secondary | ICD-10-CM

## 2023-06-20 DIAGNOSIS — E890 Postprocedural hypothyroidism: Secondary | ICD-10-CM

## 2023-06-20 DIAGNOSIS — E2089 Other specified hypoparathyroidism: Secondary | ICD-10-CM

## 2023-06-20 NOTE — Progress Notes (Signed)
 Name: [name] [name]  MRN/ DOB: [id number], [birth year]    Age/ Sex: 89 y.o., female     PCP: Ardith Dark, MD   Reason for Endocrinology Evaluation: Postoperative hypothyroid/hypocalcemia     Initial Endocrinology Clinic Visit: 09/11/2012    PATIENT IDENTIFIER: [name] [name] is a 89 y.o., female with a past medical history of Hx FTC, HTN, PE. She has followed with [facility] since 09/11/2012 for consultative assistance with management of her postoperative hypothyroid/hypocalcemia.   HISTORICAL SUMMARY:  5/07:  right lobectomy--path shows 3 cm right lobe follicular ca (T2 N0 M0).   8/07: completion left lobectomy, no more tumor seen.   9/07  thyroglobulin 11.8 (ab neg).   11/07: RAI, 101 mCi 6/08 thyroglogulin 0.2 (ab neg).   6/08: hypothyroid body scan neg (she tolerated thyroid hormone withdrawal poorly).   6/09 tg undetectable (ab=1.5) 12/10: tg undetectable (ab neg) 6/11:  tg undetectable (ab neg) 6/12: tg undetectable (ab neg) 6/13: tg undetectable (ab neg) 6/15: tg undetectable (ab neg) 6/16: tg undetectable (ab neg).   8/17: tg=0.1 (ab neg).  2/19: tg undetectable (ab neg). 5/20: tg undetectable (ab neg).  3/21 tg undetectable (ab neg).  10/22 tg undetectable (ab neg).    Postsurgical hypothyroidism: (due to SVT, she is not a candidate for TSH suppression) Postsurgical hypoparathyroidism (she takes rocaltrol; due to SVT, goal Ca++ is in normal range)   SUBJECTIVE:    Today (06/20/2023):  [name] [name] is here for for follow-up on postoperative hypothyroid and hypocalcemia.  She is accompanied by her daughter, who provided most of the history She was with her grand daughter for 5 months, so unclear if her medications intake . Daughter noted an extra 1.5 weeks of left over levothyroxine    Denies local neck swelling  Has cough and phlegm  Denies palpitations  Denies tremors  Denies constipation or diarrhea    HOME ENDOCRINE  MEDICATIONS: Levothyroxine 88 mcg daily Calcitriol 0.5 mcg daily MVI daily  Vitamin D 1000 international unit daily    HISTORY:  Past Medical History:  Past Medical History:  Diagnosis Date   ALLERGIC RHINITIS 11/02/2006   CKD (chronic kidney disease), stage III (HCC)    HYPERLIPIDEMIA 11/04/2006   HYPOTHYROIDISM, POSTSURGICAL 03/28/2007   Malignant neoplasm of thyroid gland (HCC) 03/28/2007   5/07: righte lobectomy--path shows 3 cm follicular ca 8/07: completion left lobectomy, no more tumor seen 09/07: thyroglobulin 0.2 9ab neg) 11/07: i-131 rx, 101 mci 6/08: thyroglogulin 0.2 (ab neg) , hypothyroid body scan neg (clinically tolerated hypothyroidism poorly) 06/09: tg undetectable (ab=1.5, normal) 12/10: tg undetectable (ab neg) 6/11: tg undectable (ab neg)   OSTEOPENIA 11/02/2006   Past Surgical History:  Past Surgical History:  Procedure Laterality Date   ABDOMINAL HYSTERECTOMY     CERVICAL POLYPECTOMY     OPEN REDUCTION INTERNAL FIXATION (ORIF) DISTAL RADIAL FRACTURE Left 01/14/2014   Procedure: OPEN REDUCTION INTERNAL FIXATION DISTAL RADIAL FRACTURE;  Surgeon: Sharma Covert, MD;  Location: [facility] OR;  Service: Orthopedics;  Laterality: Left;   Social History:  reports that she has never smoked. She has never used smokeless tobacco. She reports that she does not currently use alcohol. She reports that she does not use drugs. Family History:  Family History  Problem Relation Age of Onset   Diabetes Sister      HOME MEDICATIONS: Allergies as of 06/20/2023       Reactions   Verapamil Other (See Comments)   edema  Medication List        Accurate as of June 20, 2023 12:51 PM. If you have any questions, ask your nurse or doctor.          calcitRIOL 0.5 MCG capsule Commonly known as: ROCALTROL Take 1 capsule by mouth once daily   Debrox 6.5 % OTIC solution Generic drug: carbamide peroxide Place 5 drops into both ears 2 (two) times daily.   docusate sodium  100 MG capsule Commonly known as: COLACE Take 100 mg by mouth at bedtime.   furosemide 40 MG tablet Commonly known as: LASIX Take 1 tablet (40 mg total) by mouth daily as needed.   gabapentin 100 MG capsule Commonly known as: NEURONTIN Take 1 capsule by mouth at bedtime   guaiFENesin 600 MG 12 hr tablet Commonly known as: MUCINEX Take by mouth 2 (two) times daily.   levothyroxine 88 MCG tablet Commonly known as: SYNTHROID Take 1 tablet (88 mcg total) by mouth daily.   nitrofurantoin (macrocrystal-monohydrate) 100 MG capsule Commonly known as: Macrobid Take 1 capsule (100 mg total) by mouth 2 (two) times daily.   QUEtiapine 50 MG tablet Commonly known as: SEROquel Take 1 tablet (50 mg total) by mouth at bedtime.   triamcinolone ointment 0.5 % Commonly known as: KENALOG Apply 1 Application topically 2 (two) times daily. as needed to affected area.   triamcinolone ointment 0.5 % Commonly known as: KENALOG Apply 1 Application topically 2 (two) times daily.   valACYclovir 1000 MG tablet Commonly known as: VALTREX Take 1 tablet (1,000 mg total) by mouth daily. Take 1 tablet daily for 5 days as needed for outbreaks          OBJECTIVE:   PHYSICAL EXAM: VS: BP 130/80 (BP Location: Left Arm, Patient Position: Sitting, Cuff Size: Small)   Pulse 78   Ht 5\' 3"  (1.6 m)   Wt 118 lb 12.8 oz (53.9 kg)   SpO2 95%   BMI 21.04 kg/m    EXAM: General: Pt appears well and is in NAD  Neck: General: Supple without adenopathy. Thyroid:  No goiter or nodules appreciated  Lungs: Clear with good BS bilat   Heart: Auscultation: RRR.  Extremities:  BL LE: No pretibial edema   Mental Status: Judgment, insight: Intact Orientation: Oriented to time, place, and person Mood and affect: No depression, anxiety, or agitation     DATA REVIEWED:   Latest Reference Range & Units 06/05/23 14:34  Sodium 135 - 145 mEq/L 137  Potassium 3.5 - 5.1 mEq/L 4.3  Chloride 96 - 112 mEq/L 96   CO2 19 - 32 mEq/L 30  Glucose 70 - 99 mg/dL 75  BUN 6 - 23 mg/dL 34 (H)  Creatinine 6.21 - 1.20 mg/dL 3.08 (H)  Calcium 8.4 - 10.5 mg/dL 9.4  Alkaline Phosphatase 39 - 117 U/L 27 (L)  Albumin 3.5 - 5.2 g/dL 4.5  AST 0 - 37 U/L 25  ALT 0 - 35 U/L 13  Total Protein 6.0 - 8.3 g/dL 7.9  Total Bilirubin 0.2 - 1.2 mg/dL 0.5  GFR >65.78 mL/min 30.60 (L)    Latest Reference Range & Units 06/05/23 14:34  TSH 0.35 - 5.50 uIU/mL 16.95 (H)  (H): Data is abnormally high    Old records , labs and images have been reviewed.    ASSESSMENT / PLAN / RECOMMENDATIONS:   Postoperative hypothyroidism:   -Her TSH increased from 0.16 u IU/mL to 16.95 u IU/mL, by decreasing levothyroxine from 100 mcg to 88 mcg  dose.  Suspect imperfect adherence to levothyroxine intake -Per daughter, patient was with her granddaughter for approximately 5 months, daughter noted a week and a half worth of extra levothyroxine -I have strongly encouraged daughter to start using a pillbox for levothyroxine, she may double up the dose if 1 is missed. -No changes at this time, will ensure compliance and recheck in 2 months  Medications  Continue levothyroxine 88 mcg daily  2.  Postoperative hypocalcemia:  -Serum calcium within normal range -No changes at this time  Medication  Continue MVI Continue calcitriol 0.5 mcg daily  3.  Vitamin D insufficiency:   OTC vitamin D3 1000 IU daily  3. Hx FTC:  -S/p total thyroidectomy secondary to follicular thyroid carcinoma in 2007 -S/P RAI 2007 -WBS no evidence of mets/recurrence 2009 -Last thyroid bed ultrasound 2015 did not show any evidence of recurrence  Follow-up in 6 months   Signed electronically by: Lyndle Herrlich, MD  [facility]  [facility] [address] [place] [place], [place] [address] Phone: [phone] FAX: [phone]      CC: Ardith Dark, MD [address] [place] [address] Phone:  [phone]  Fax: [phone]   Return to Endocrinology clinic as below: Future Appointments  Date Time Provider Department Center  06/20/2023  1:20 PM Ariba Lehnen, Konrad Dolores, MD [facility] None  07/23/2023 11:30 AM Jenel Lucks D, LCSW [facility] None  08/22/2023  1:15 PM [facility] NURSE [facility] None  08/22/2023  1:30 PM Marcos Eke, PA-C [facility] None

## 2023-06-21 ENCOUNTER — Encounter: Payer: Self-pay | Admitting: Family Medicine

## 2023-06-21 NOTE — Telephone Encounter (Signed)
**Note De-identified  [place] Obfuscation** Please advise 

## 2023-06-22 ENCOUNTER — Encounter: Payer: Self-pay | Admitting: Family Medicine

## 2023-06-22 NOTE — Telephone Encounter (Signed)
 Can we clarify what dose of tylenol she is taking? It is ok to take 1000mg  three times daily as needed.   [name] [name]. [name], MD 06/22/2023 8:33 AM

## 2023-06-22 NOTE — Telephone Encounter (Signed)
 See note

## 2023-06-22 NOTE — Telephone Encounter (Signed)
 Can we have them schedule a visit? Virtual is fine.  There are some options but we need to discuss risk/benefits and potential side effects.  She cannot take NSAIDs due such as naproxen or ibuprofen due to her decreased kidney function.

## 2023-06-25 NOTE — Telephone Encounter (Signed)
 I appreciate the update. I am glad that she is improving and I am glad that the medication is helping with sleep.  They should let us know if she has any change in symptoms.  [name]. [name], MD 06/25/2023 7:57 AM

## 2023-07-10 ENCOUNTER — Encounter: Payer: Self-pay | Admitting: Family Medicine

## 2023-07-10 ENCOUNTER — Ambulatory Visit (INDEPENDENT_AMBULATORY_CARE_PROVIDER_SITE_OTHER): Admitting: Family Medicine

## 2023-07-10 VITALS — BP 131/88 | HR 82 | Temp 98.1°F | Ht 63.0 in | Wt 124.6 lb

## 2023-07-10 DIAGNOSIS — N390 Urinary tract infection, site not specified: Secondary | ICD-10-CM

## 2023-07-10 DIAGNOSIS — I10 Essential (primary) hypertension: Secondary | ICD-10-CM

## 2023-07-10 DIAGNOSIS — R059 Cough, unspecified: Secondary | ICD-10-CM

## 2023-07-10 DIAGNOSIS — G629 Polyneuropathy, unspecified: Secondary | ICD-10-CM

## 2023-07-10 DIAGNOSIS — E890 Postprocedural hypothyroidism: Secondary | ICD-10-CM

## 2023-07-10 LAB — URINALYSIS, ROUTINE W REFLEX MICROSCOPIC
Bilirubin Urine: NEGATIVE
Ketones, ur: NEGATIVE
Nitrite: POSITIVE — AB
Specific Gravity, Urine: 1.015 (ref 1.000–1.030)
Total Protein, Urine: 30 — AB
Urine Glucose: NEGATIVE
Urobilinogen, UA: 0.2 (ref 0.0–1.0)
pH: 6 (ref 5.0–8.0)

## 2023-07-10 MED ORDER — BENZONATATE 200 MG PO CAPS: 200.0000 mg | ORAL_CAPSULE | Freq: Two times a day (BID) | ORAL | 0 refills | Status: DC | PRN

## 2023-07-10 MED ORDER — AZITHROMYCIN 250 MG PO TABS: ORAL_TABLET | ORAL | 0 refills | Status: DC

## 2023-07-10 MED ORDER — GABAPENTIN 100 MG PO CAPS: 100.0000 mg | ORAL_CAPSULE | Freq: Every day | ORAL | 3 refills | Status: DC

## 2023-07-10 NOTE — Progress Notes (Signed)
   [name] [name] is a 89 y.o. female who presents today for an office visit.  Assessment/Plan:  New/Acute Problems: Cough Overall reassuring exam though does have rhonchi and congestion on exam.  Encouraged hydration.  They can use over-the-counter meds as needed.  Will send in pocket prescription for azithromycin.  Also start Tessalon.  We discussed reasons to return to care and seek emergent care.  UTI She has noticed a little bit stronger smelling urine the last few days.  May be related to dehydration due to her above URI however we will check UA and urine culture to rule out UTI.  She did have a UTI few weeks ago that responded well to Macrobid.  Chronic Problems Addressed Today: Essential hypertension Blood pressure at goal today without meds.  HYPOTHYROIDISM, POSTSURGICAL Following with endocrinology.  Last TSH was not at goal due to being off of her Synthroid.  She is now back on Synthroid 88 mcg daily.  Doing much better now that she is back on a consistent dose of this.  Neuropathy Will refill gabapentin.  Hopefully this will help some with her pain due to osteoarthritis as well.  She has done well with gabapentin 100 mg night previously.     Subjective:  HPI:  See A/P for status of chronic conditions.  Patient is here today with cough and congestion.  This started 2 days ago.  Cough is productive.  Worse in the last few days.  No fevers or chills.  Some sinus congestion.        Objective:  Physical Exam: BP 131/88   Pulse 82   Temp 98.1 F (36.7 C) (Temporal)   Ht 5\' 3"  (1.6 m)   Wt 124 lb 9.6 oz (56.5 kg)   SpO2 98%   BMI 22.07 kg/m   Gen: No acute distress, resting comfortably CV: Regular rate and rhythm with no murmurs appreciated Pulm: Normal work of breathing, speaking in full sentences.  Occasional rhonchi noted in bilateral lung fields. Neuro: Grossly normal, moves all extremities Psych: Normal affect and thought content      Amun Stemm M. [name],  MD 07/10/2023 12:13 PM

## 2023-07-10 NOTE — Assessment & Plan Note (Signed)
 Following with endocrinology.  Last TSH was not at goal due to being off of her Synthroid.  She is now back on Synthroid 88 mcg daily.  Doing much better now that she is back on a consistent dose of this.

## 2023-07-10 NOTE — Assessment & Plan Note (Signed)
 Will refill gabapentin.  Hopefully this will help some with her pain due to osteoarthritis as well.  She has done well with gabapentin 100 mg night previously.

## 2023-07-10 NOTE — Assessment & Plan Note (Signed)
 Blood pressure at goal today without meds.

## 2023-07-10 NOTE — Patient Instructions (Addendum)
 It was very nice to see you today!  Please start the azithromycin.  Also try the Tessalon to help with the cough.  I will refill gabapentin today.  We will check a urine sample.  Return if symptoms worsen or fail to improve.   Take care, Dr [name]  PLEASE NOTE:  If you had any lab tests, please let us know if you have not heard back within a few days. You may see your results on mychart before we have a chance to review them but we will give you a call once they are reviewed by [place].   If we ordered any referrals today, please let us know if you have not heard from their office within the next week.   If you had any urgent prescriptions sent in today, please check with the pharmacy within an hour of our visit to make sure the prescription was transmitted appropriately.   Please try these tips to maintain a healthy lifestyle:  Eat at least 3 REAL meals and 1-2 snacks per day.  Aim for no more than 5 hours between eating.  If you eat breakfast, please do so within one hour of getting up.   Each meal should contain half fruits/vegetables, one quarter protein, and one quarter carbs (no bigger than a computer mouse)  Cut down on sweet beverages. This includes juice, soda, and sweet tea.   Drink at least 1 glass of water with each meal and aim for at least 8 glasses per day  Exercise at least 150 minutes every week.

## 2023-07-12 ENCOUNTER — Encounter: Payer: Self-pay | Admitting: Family Medicine

## 2023-07-12 ENCOUNTER — Other Ambulatory Visit: Payer: Self-pay | Admitting: *Deleted

## 2023-07-12 LAB — URINE CULTURE
MICRO NUMBER:: 16273927
SPECIMEN QUALITY:: ADEQUATE

## 2023-07-12 MED ORDER — NITROFURANTOIN MONOHYD MACRO 100 MG PO CAPS: 100.0000 mg | ORAL_CAPSULE | Freq: Two times a day (BID) | ORAL | 0 refills | Status: DC

## 2023-07-12 NOTE — Progress Notes (Signed)
 Urine culture shows UTI.  Recommend starting Macrobid 100 mg twice daily for 7 days.  She should let us know if not improving.

## 2023-07-14 ENCOUNTER — Other Ambulatory Visit: Payer: Self-pay | Admitting: Internal Medicine

## 2023-07-18 ENCOUNTER — Encounter: Payer: Self-pay | Admitting: Family Medicine

## 2023-07-18 ENCOUNTER — Other Ambulatory Visit: Payer: Self-pay | Admitting: *Deleted

## 2023-07-18 NOTE — Telephone Encounter (Signed)
 Spoke with patient daughter Stated taking her fluid pill and feet getting better Schedule an office visit with PCP for Friday

## 2023-07-20 ENCOUNTER — Encounter: Payer: Self-pay | Admitting: Family Medicine

## 2023-07-20 ENCOUNTER — Ambulatory Visit: Admitting: Family Medicine

## 2023-07-20 ENCOUNTER — Ambulatory Visit: Payer: PPO | Admitting: Physician Assistant

## 2023-07-20 ENCOUNTER — Ambulatory Visit: Payer: PPO

## 2023-07-20 VITALS — BP 138/80 | HR 78 | Temp 97.3°F | Ht 63.0 in | Wt 124.0 lb

## 2023-07-20 DIAGNOSIS — R600 Localized edema: Secondary | ICD-10-CM

## 2023-07-20 DIAGNOSIS — N1832 Chronic kidney disease, stage 3b: Secondary | ICD-10-CM

## 2023-07-20 DIAGNOSIS — J309 Allergic rhinitis, unspecified: Secondary | ICD-10-CM | POA: Diagnosis not present

## 2023-07-20 DIAGNOSIS — E890 Postprocedural hypothyroidism: Secondary | ICD-10-CM

## 2023-07-20 DIAGNOSIS — N390 Urinary tract infection, site not specified: Secondary | ICD-10-CM | POA: Diagnosis not present

## 2023-07-20 LAB — COMPREHENSIVE METABOLIC PANEL WITH GFR
ALT: 69 U/L — ABNORMAL HIGH (ref 0–35)
AST: 40 U/L — ABNORMAL HIGH (ref 0–37)
Albumin: 4.3 g/dL (ref 3.5–5.2)
Alkaline Phosphatase: 50 U/L (ref 39–117)
BUN: 19 mg/dL (ref 6–23)
CO2: 29 meq/L (ref 19–32)
Calcium: 7.2 mg/dL — ABNORMAL LOW (ref 8.4–10.5)
Chloride: 97 meq/L (ref 96–112)
Creatinine, Ser: 1.55 mg/dL — ABNORMAL HIGH (ref 0.40–1.20)
GFR: 28.69 mL/min — ABNORMAL LOW (ref >60.00)
Glucose, Bld: 98 mg/dL (ref 70–99)
Potassium: 4 meq/L (ref 3.5–5.1)
Sodium: 137 meq/L (ref 135–145)
Total Bilirubin: 0.7 mg/dL (ref 0.2–1.2)
Total Protein: 7.2 g/dL (ref 6.0–8.3)

## 2023-07-20 LAB — URINALYSIS, ROUTINE W REFLEX MICROSCOPIC
Bilirubin Urine: NEGATIVE
Hgb urine dipstick: NEGATIVE
Ketones, ur: NEGATIVE
Leukocytes,Ua: NEGATIVE
Nitrite: POSITIVE — AB
Specific Gravity, Urine: 1.015 (ref 1.000–1.030)
Total Protein, Urine: 30 — AB
Urine Glucose: NEGATIVE
Urobilinogen, UA: 0.2 (ref 0.0–1.0)
pH: 6 (ref 5.0–8.0)

## 2023-07-20 LAB — CBC
HCT: 34.6 % — ABNORMAL LOW (ref 36.0–46.0)
Hemoglobin: 11.4 g/dL — ABNORMAL LOW (ref 12.0–15.0)
MCHC: 33 g/dL (ref 30.0–36.0)
MCV: 94 fl (ref 78.0–100.0)
Platelets: 304 10*3/uL (ref 150.0–400.0)
RBC: 3.68 Mil/uL — ABNORMAL LOW (ref 3.87–5.11)
RDW: 14.6 % (ref 11.5–15.5)
WBC: 8.3 10*3/uL (ref 4.0–10.5)

## 2023-07-20 LAB — TSH: TSH: 11.72 u[IU]/mL — ABNORMAL HIGH (ref 0.35–5.50)

## 2023-07-20 NOTE — Patient Instructions (Signed)
 It was very nice to see you today!  I am glad the swelling in the legs is improving.  Please continue the Lasix.  Try to keep legs elevated is much as you can.  Will check blood work and a urine sample today.  Return if symptoms worsen or fail to improve.   Take care, Dr [name]  PLEASE NOTE:  If you had any lab tests, please let us know if you have not heard back within a few days. You may see your results on mychart before we have a chance to review them but we will give you a call once they are reviewed by [place].   If we ordered any referrals today, please let us know if you have not heard from their office within the next week.   If you had any urgent prescriptions sent in today, please check with the pharmacy within an hour of our visit to make sure the prescription was transmitted appropriately.   Please try these tips to maintain a healthy lifestyle:  Eat at least 3 REAL meals and 1-2 snacks per day.  Aim for no more than 5 hours between eating.  If you eat breakfast, please do so within one hour of getting up.   Each meal should contain half fruits/vegetables, one quarter protein, and one quarter carbs (no bigger than a computer mouse)  Cut down on sweet beverages. This includes juice, soda, and sweet tea.   Drink at least 1 glass of water with each meal and aim for at least 8 glasses per day  Exercise at least 150 minutes every week.

## 2023-07-20 NOTE — Assessment & Plan Note (Signed)
 Patient with flareup of leg edema over the last week or so.  May be related to her recent URI and UTI however symptoms have improved significantly since restarting Lasix.  We will check labs today.  We did recommend continuing Lasix and leg elevation.  Also discussed salt avoidance.  They will let us know if this does not continue to improve.

## 2023-07-20 NOTE — Progress Notes (Signed)
   [name] [name] is a 89 y.o. female who presents today for an office visit.  Assessment/Plan:  New/Acute Problems: Cough Improved significantly since our last visit.  Reassuring exam today.  They will let us know if she has any recurrence  UTI Symptoms improving with Macrobid.  Will recheck UA and urine culture today.  Chronic Problems Addressed Today: Leg edema Patient with flareup of leg edema over the last week or so.  May be related to her recent URI and UTI however symptoms have improved significantly since restarting Lasix.  We will check labs today.  We did recommend continuing Lasix and leg elevation.  Also discussed salt avoidance.  They will let us know if this does not continue to improve.  CKD (chronic kidney disease), stage III (HCC) Check c-Met.  HYPOTHYROIDISM, POSTSURGICAL Check TSH.  Continue Synthroid 88 mcg daily.  She is doing well now she is back on this dose.  Allergic rhinitis Worsened recently due to pollen but overall stable.  She can use over-the-counter antihistamines as needed.     Subjective:  HPI:  Patient here today for follow up.  Primary concern today is lower extremity edema and blister formation.  I last saw her 10 days ago for cough.  This was treated with tessalon and azithromycin. She was also found to have a UTI and was started on Macrobid 100 mg twice daily twice daily.  She did well with this however after a couple of days noticed increased feeling in both of her legs and feet.  Subsequently developed blisters in both of her legs as well.  They were concerned that this may be due to a side effect of her medications.  She started taking Lasix which she has been prescribed in the past for leg edema and symptoms have improved significantly last few days.  The blister on her right foot has ruptured though still has a blister on her left foot.  Swelling has improved a lot.  The cough we saw her for from last week has resolved.  UTI seem to be  improving as well.  No fevers or chills.  No shortness of breath.       Objective:  Physical Exam: BP 138/80   Pulse 78   Temp (!) 97.3 F (36.3 C) (Temporal)   Ht 5\' 3"  (1.6 m)   Wt 124 lb (56.2 kg)   SpO2 98%   BMI 21.97 kg/m   Gen: No acute distress, resting comfortably CV: Regular rate and rhythm with no murmurs appreciated Pulm: Normal work of breathing, clear to auscultation bilaterally with no crackles, wheezes, or rhonchi MUSCULOSKELETAL - Legs: 2+ pitting edema to knees bilaterally.  Rupture bulla approximately 5 x 10 mm on right dorsal foot.  Tense bulla approximately 5 x 10 mm on left dorsal foot.  No surrounding erythema. Neuro: Grossly normal, moves all extremities Psych: Normal affect and thought content      [name] [name] M. [name], MD 07/20/2023 1:50 PM

## 2023-07-20 NOTE — Assessment & Plan Note (Signed)
 Worsened recently due to pollen but overall stable.  She can use over-the-counter antihistamines as needed.

## 2023-07-20 NOTE — Assessment & Plan Note (Signed)
Check c-Met. °

## 2023-07-20 NOTE — Assessment & Plan Note (Signed)
 Check TSH.  Continue Synthroid 88 mcg daily.  She is doing well now she is back on this dose.

## 2023-07-22 LAB — URINE CULTURE
MICRO NUMBER:: 16319763
SPECIMEN QUALITY:: ADEQUATE

## 2023-07-23 ENCOUNTER — Ambulatory Visit: Payer: Self-pay | Admitting: Licensed Clinical Social Worker

## 2023-07-24 ENCOUNTER — Encounter: Payer: Self-pay | Admitting: Family Medicine

## 2023-07-24 NOTE — Progress Notes (Signed)
 Urine shows she has an ecoli urinary tract infection. Recommend another course of Macrobid 100 mg twice daily x 7 days.   Her thyroid level is improving. We should recheck again in 4 weeks.   Her liver numbers are elevated. Can we have her come back in 1-2 weeks to recheck?  Please place future order for CMET.

## 2023-07-25 ENCOUNTER — Encounter: Payer: Self-pay | Admitting: Family Medicine

## 2023-07-25 ENCOUNTER — Other Ambulatory Visit: Payer: Self-pay | Admitting: *Deleted

## 2023-07-25 DIAGNOSIS — R748 Abnormal levels of other serum enzymes: Secondary | ICD-10-CM

## 2023-07-25 MED ORDER — NITROFURANTOIN MONOHYD MACRO 100 MG PO CAPS: 100.0000 mg | ORAL_CAPSULE | Freq: Two times a day (BID) | ORAL | 0 refills | Status: AC

## 2023-07-25 NOTE — Patient Instructions (Signed)
 Visit Information  Thank you for taking time to visit with me today. Please don't hesitate to contact me if I can be of assistance to you before our next scheduled appointment.  Our next appointment is by telephone on 5/26 at 11:30 AM Please call the care guide team at [phone] if you need to cancel or reschedule your appointment.   Following is a copy of your care plan:   Goals Addressed             This Visit's Progress    LCSW V[facility] Social Work Care Plan   On track    Problems:   Level of Care Concerns:Memory Deficits  CSW Clinical Goal(s):   Over the next 90 days the Patient will attend all scheduled medical appointments as evidenced by patient report and care team review of appointment completion in electronic MEDICAL RECORD NUMBER  demonstrate improved health management independence as evidenced bypt and caregiver report .  Interventions:  Level of Care Concerns in a patient with  Memory Concerns Current level of care: Home with other family or significant other(s): Adult Daughter  Evaluation of patient's unmet needs in current living environment ADL's Assessed needs, level of care concerns, how currently meeting needs and barriers to care Family continue to assist with pt ADLs  Facility  Assessed needs and reviewed facility placement process; as well as the different levels of care Patient is not interested in placement at this time  Active listening / Reflection utilized Caregiver stress acknowledged :Daughter reports pt's sleep and behavior/mood has improved with tx of UTI and medication management Emotional Support Provided  Patient Goals/Self-Care Activities:  Increase coping skills and healthy habits  Plan:   Telephone follow up appointment with care management team member scheduled for:  6 weeks     COMPLETED: Obtain Supportive Resources-Placement       Activities and task to complete in order to accomplish goals.   Keep all upcoming appointments  discussed today Continue with compliance of taking medication prescribed by Doctor Implement healthy coping skills discussed to assist with management of symptoms Follow up with Department of Social Services  Review booklet ''When a loved one need long-term care"          Please call the Suicide and Crisis Lifeline: 988 go to [facility] [address], [place] [phone]) call 911 if you are experiencing a Mental Health or Behavioral Health Crisis or need someone to talk to.  Patient verbalizes understanding of instructions and care plan provided today and agrees to view in MyChart. Active MyChart status and patient understanding of how to access instructions and care plan via MyChart confirmed with patient.     Arlis Bent [facility]  [facility], [facility] Clinical Social Worker Direct Dial: [phone]  Fax: [phone] Website: [email or web address] 1:48 PM

## 2023-07-25 NOTE — Patient Outreach (Signed)
 Complex Care Management   Visit Note  07/23/2023  Name:  [name] [name] MRN: [id number] DOB: January 24, 1928  Situation: Referral received for Complex Care Management related to  Memory Concerns  I obtained verbal consent from Caregiver.  Visit completed with pt's daughter  on the phone  Background:   Past Medical History:  Diagnosis Date   ALLERGIC RHINITIS 11/02/2006   CKD (chronic kidney disease), stage III (HCC)    HYPERLIPIDEMIA 11/04/2006   HYPOTHYROIDISM, POSTSURGICAL 03/28/2007   Malignant neoplasm of thyroid gland (HCC) 03/28/2007   5/07: righte lobectomy--path shows 3 cm follicular ca 8/07: completion left lobectomy, no more tumor seen 09/07: thyroglobulin 0.2 9ab neg) 11/07: i-131 rx, 101 [facility] 6/08: thyroglogulin 0.2 (ab neg) , hypothyroid body scan neg (clinically tolerated hypothyroidism poorly) 06/09: tg undetectable (ab=1.5, normal) 12/10: tg undetectable (ab neg) 6/11: tg undectable (ab neg)   OSTEOPENIA 11/02/2006    Assessment: Patient Reported Symptoms:  Cognitive Cognitive Status: Alert and oriented to person, place, and time      Neurological      HEENT HEENT Symptoms Reported: No symptoms reported      Cardiovascular Cardiovascular Symptoms Reported: No symptoms reported    Respiratory Respiratory Symptoms Reported: No symptoms reported    Endocrine Patient reports the following symptoms related to hypoglycemia or hyperglycemia : No symptoms reported    Gastrointestinal Gastrointestinal Symptoms Reported: No symptoms reported      Genitourinary      Integumentary Integumentary Symptoms Reported: No symptoms reported    Musculoskeletal Musculoskelatal Symptoms Reviewed: No symptoms reported        Psychosocial Psychosocial Symptoms Reported: No symptoms reported Behavioral Management Strategies: Coping strategies, Medication therapy Behavioral Health Self-Management Outcome: 4 (good) Behavioral Health Comment: Per pt's daughter, pt is sleeping a whole  lot better and irritability has decreased significantly. Major Change/Loss/Stressor/Fears (CP): Medical condition, self Techniques to Cope with Loss/Stress/Change: Medication Quality of Family Relationships: involved, helpful Do you feel physically threatened by others?: No      06/05/2023    1:26 PM  Depression screen PHQ 2/9  Decreased Interest 0  Down, Depressed, Hopeless 0  PHQ - 2 Score 0  Altered sleeping 0  Tired, decreased energy 0  Change in appetite 0  Feeling bad or failure about yourself  0  Trouble concentrating 0  Moving slowly or fidgety/restless 0  Suicidal thoughts 0  PHQ-9 Score 0  Difficult doing work/chores Not difficult at all    There were no vitals filed for this visit.  Medications Reviewed Today     Reviewed by Bridgett Larsson, LCSW (Social Worker) on 07/23/23 at 1141  Med List Status: <None>   Medication Order Taking? Sig Documenting Provider Last Dose Status Informant  calcitRIOL (ROCALTROL) 0.5 MCG capsule [id number] Yes Take 1 capsule by mouth once daily Shamleffer, Konrad Dolores, MD Taking Active   docusate sodium (COLACE) 100 MG capsule [id number] Yes Take 100 mg by mouth at bedtime.  [provider] Taking Active Child, Pharmacy Records  furosemide (LASIX) 40 MG tablet [id number] Yes Take 1 tablet (40 mg total) by mouth daily as needed. Ardith Dark, MD Taking Active   gabapentin (NEURONTIN) 100 MG capsule [id number] Yes Take 1 capsule (100 mg total) by mouth at bedtime. Ardith Dark, MD Taking Active   levothyroxine (SYNTHROID) 88 MCG tablet [id number] Yes Take 1 tablet by mouth once daily Shamleffer, Konrad Dolores, MD Taking Active   QUEtiapine (SEROQUEL) 50 MG tablet [id number] Yes Take 1 tablet (50  mg total) by mouth at bedtime. Rodney Clamp, MD Taking Active   triamcinolone ointment (KENALOG) 0.5 % [id number] Yes Apply 1 Application topically 2 (two) times daily. as needed to affected area. Rodney Clamp, MD Taking Active  Child, Pharmacy Records  triamcinolone ointment (KENALOG) 0.5 % [id number] Yes Apply 1 Application topically 2 (two) times daily. Rodney Clamp, MD Taking Active   valACYclovir (VALTREX) 1000 MG tablet [id number] Yes Take 1 tablet (1,000 mg total) by mouth daily. Take 1 tablet daily for 5 days as needed for outbreaks Rodney Clamp, MD Taking Active             Recommendation:   Continue utilizing strategies discussed to assist with symptom management  Follow Up Plan:   Telephone follow-up 6 weeks  Alease Hunter, LCSW [place]  [facility], [facility] Clinical Social Worker Direct Dial: [phone]  Fax: [phone] Website: [email or web address] 1:47 PM

## 2023-08-07 ENCOUNTER — Telehealth: Payer: Self-pay | Admitting: *Deleted

## 2023-08-07 NOTE — Progress Notes (Signed)
 Complex Care Management Care Guide Note  08/07/2023 Name: [name] [name] MRN: [id number] DOB: 1928-01-18  [name] [name] is a 89 y.o. year old female who is a primary care patient of Rodney Clamp, MD and is actively engaged with the care management team. I reached out to [name] [name] by phone today to assist with re-scheduling  with the Licensed Clinical Child psychotherapist.  Follow up plan: Unsuccessful telephone outreach attempt made. A HIPAA compliant phone message was left for the patient providing contact information and requesting a return call.  Barnie Bora  [facility]  [facility], [facility] Guide  Direct Dial: [phone]  Fax [phone]

## 2023-08-13 ENCOUNTER — Encounter (HOSPITAL_COMMUNITY): Payer: Self-pay

## 2023-08-13 ENCOUNTER — Inpatient Hospital Stay (HOSPITAL_COMMUNITY)
Admission: EM | Admit: 2023-08-13 | Discharge: 2023-08-21 | DRG: 640 | Disposition: A | Discharge status: Skilled Nursing Facility | Attending: Family Medicine | Admitting: Family Medicine

## 2023-08-13 ENCOUNTER — Other Ambulatory Visit (INDEPENDENT_AMBULATORY_CARE_PROVIDER_SITE_OTHER)

## 2023-08-13 ENCOUNTER — Telehealth: Payer: Self-pay | Admitting: *Deleted

## 2023-08-13 ENCOUNTER — Other Ambulatory Visit: Payer: Self-pay

## 2023-08-13 DIAGNOSIS — G9341 Metabolic encephalopathy: Secondary | ICD-10-CM | POA: Diagnosis present

## 2023-08-13 DIAGNOSIS — Z6821 Body mass index (BMI) 21.0-21.9, adult: Secondary | ICD-10-CM

## 2023-08-13 DIAGNOSIS — E785 Hyperlipidemia, unspecified: Secondary | ICD-10-CM | POA: Diagnosis not present

## 2023-08-13 DIAGNOSIS — Z66 Do not resuscitate: Secondary | ICD-10-CM | POA: Diagnosis not present

## 2023-08-13 DIAGNOSIS — R4182 Altered mental status, unspecified: Secondary | ICD-10-CM | POA: Diagnosis not present

## 2023-08-13 DIAGNOSIS — E876 Hypokalemia: Secondary | ICD-10-CM | POA: Diagnosis not present

## 2023-08-13 DIAGNOSIS — Z91148 Patient's other noncompliance with medication regimen for other reason: Secondary | ICD-10-CM

## 2023-08-13 DIAGNOSIS — Z79890 Hormone replacement therapy (postmenopausal): Secondary | ICD-10-CM

## 2023-08-13 DIAGNOSIS — C73 Malignant neoplasm of thyroid gland: Secondary | ICD-10-CM | POA: Diagnosis present

## 2023-08-13 DIAGNOSIS — Z87440 Personal history of urinary (tract) infections: Secondary | ICD-10-CM

## 2023-08-13 DIAGNOSIS — E890 Postprocedural hypothyroidism: Secondary | ICD-10-CM | POA: Diagnosis present

## 2023-08-13 DIAGNOSIS — R630 Anorexia: Secondary | ICD-10-CM | POA: Diagnosis not present

## 2023-08-13 DIAGNOSIS — E039 Hypothyroidism, unspecified: Secondary | ICD-10-CM | POA: Diagnosis not present

## 2023-08-13 DIAGNOSIS — K8020 Calculus of gallbladder without cholecystitis without obstruction: Secondary | ICD-10-CM | POA: Diagnosis not present

## 2023-08-13 DIAGNOSIS — I129 Hypertensive chronic kidney disease with stage 1 through stage 4 chronic kidney disease, or unspecified chronic kidney disease: Secondary | ICD-10-CM | POA: Diagnosis present

## 2023-08-13 DIAGNOSIS — J309 Allergic rhinitis, unspecified: Secondary | ICD-10-CM | POA: Diagnosis not present

## 2023-08-13 DIAGNOSIS — H9190 Unspecified hearing loss, unspecified ear: Secondary | ICD-10-CM | POA: Diagnosis present

## 2023-08-13 DIAGNOSIS — Z79899 Other long term (current) drug therapy: Secondary | ICD-10-CM | POA: Diagnosis not present

## 2023-08-13 DIAGNOSIS — N179 Acute kidney failure, unspecified: Secondary | ICD-10-CM | POA: Diagnosis present

## 2023-08-13 DIAGNOSIS — N1832 Chronic kidney disease, stage 3b: Secondary | ICD-10-CM | POA: Diagnosis not present

## 2023-08-13 DIAGNOSIS — H9193 Unspecified hearing loss, bilateral: Secondary | ICD-10-CM | POA: Diagnosis not present

## 2023-08-13 DIAGNOSIS — Z85850 Personal history of malignant neoplasm of thyroid: Secondary | ICD-10-CM | POA: Diagnosis not present

## 2023-08-13 DIAGNOSIS — N183 Chronic kidney disease, stage 3 unspecified: Secondary | ICD-10-CM | POA: Diagnosis present

## 2023-08-13 DIAGNOSIS — E209 Hypoparathyroidism, unspecified: Secondary | ICD-10-CM | POA: Diagnosis not present

## 2023-08-13 DIAGNOSIS — R7989 Other specified abnormal findings of blood chemistry: Secondary | ICD-10-CM | POA: Diagnosis present

## 2023-08-13 DIAGNOSIS — M8580 Other specified disorders of bone density and structure, unspecified site: Secondary | ICD-10-CM | POA: Diagnosis present

## 2023-08-13 DIAGNOSIS — K5900 Constipation, unspecified: Secondary | ICD-10-CM | POA: Diagnosis not present

## 2023-08-13 DIAGNOSIS — R748 Abnormal levels of other serum enzymes: Secondary | ICD-10-CM

## 2023-08-13 DIAGNOSIS — E892 Postprocedural hypoparathyroidism: Secondary | ICD-10-CM | POA: Diagnosis not present

## 2023-08-13 DIAGNOSIS — Z90710 Acquired absence of both cervix and uterus: Secondary | ICD-10-CM

## 2023-08-13 DIAGNOSIS — Z888 Allergy status to other drugs, medicaments and biological substances status: Secondary | ICD-10-CM | POA: Diagnosis not present

## 2023-08-13 DIAGNOSIS — Z86711 Personal history of pulmonary embolism: Secondary | ICD-10-CM | POA: Diagnosis not present

## 2023-08-13 DIAGNOSIS — R4181 Age-related cognitive decline: Secondary | ICD-10-CM | POA: Diagnosis not present

## 2023-08-13 DIAGNOSIS — Z7401 Bed confinement status: Secondary | ICD-10-CM | POA: Diagnosis not present

## 2023-08-13 DIAGNOSIS — I10 Essential (primary) hypertension: Secondary | ICD-10-CM | POA: Diagnosis not present

## 2023-08-13 DIAGNOSIS — E2089 Other specified hypoparathyroidism: Secondary | ICD-10-CM | POA: Diagnosis not present

## 2023-08-13 LAB — COMPREHENSIVE METABOLIC PANEL WITH GFR
ALT: 77 U/L — ABNORMAL HIGH (ref 0–35)
ALT: 97 U/L — ABNORMAL HIGH (ref 0–44)
AST: 73 U/L — ABNORMAL HIGH (ref 0–37)
AST: 107 U/L — ABNORMAL HIGH (ref 15–41)
Albumin: 3.7 g/dL (ref 3.5–5.2)
Albumin: 3.9 g/dL (ref 3.5–5.0)
Alkaline Phosphatase: 67 U/L (ref 39–117)
Alkaline Phosphatase: 78 U/L (ref 38–126)
Anion gap: 16 — ABNORMAL HIGH (ref 5–15)
BUN: 46 mg/dL — ABNORMAL HIGH (ref 6–23)
BUN: 47 mg/dL — ABNORMAL HIGH (ref 8–23)
CO2: 24 meq/L (ref 19–32)
CO2: 21 mmol/L — ABNORMAL LOW (ref 22–32)
Calcium: 5.4 mg/dL — CL (ref 8.4–10.5)
Calcium: 5.7 mg/dL — CL (ref 8.9–10.3)
Chloride: 99 meq/L (ref 96–112)
Chloride: 100 mmol/L (ref 98–111)
Creatinine, Ser: 2.04 mg/dL — ABNORMAL HIGH (ref 0.40–1.20)
Creatinine, Ser: 1.85 mg/dL — ABNORMAL HIGH (ref 0.44–1.00)
GFR, Estimated: 25 mL/min — ABNORMAL LOW (ref >60)
GFR: 20.62 mL/min — ABNORMAL LOW (ref >60.00)
Glucose, Bld: 103 mg/dL — ABNORMAL HIGH (ref 70–99)
Glucose, Bld: 125 mg/dL — ABNORMAL HIGH (ref 70–99)
Potassium: 4 meq/L (ref 3.5–5.1)
Potassium: 5.2 mmol/L — ABNORMAL HIGH (ref 3.5–5.1)
Sodium: 137 meq/L (ref 135–145)
Sodium: 137 mmol/L (ref 135–145)
Total Bilirubin: 1.1 mg/dL (ref 0.2–1.2)
Total Bilirubin: 1.7 mg/dL — ABNORMAL HIGH (ref 0.0–1.2)
Total Protein: 6.6 g/dL (ref 6.0–8.3)
Total Protein: 7.8 g/dL (ref 6.5–8.1)

## 2023-08-13 LAB — CBC WITH DIFFERENTIAL/PLATELET
Abs Immature Granulocytes: 0.02 10*3/uL (ref 0.00–0.07)
Basophils Absolute: 0.1 10*3/uL (ref 0.0–0.1)
Basophils Relative: 1 %
Eosinophils Absolute: 0 10*3/uL (ref 0.0–0.5)
Eosinophils Relative: 1 %
HCT: 43.1 % (ref 36.0–46.0)
Hemoglobin: 13.3 g/dL (ref 12.0–15.0)
Immature Granulocytes: 0 %
Lymphocytes Relative: 16 %
Lymphs Abs: 0.9 10*3/uL (ref 0.7–4.0)
MCH: 29.2 pg (ref 26.0–34.0)
MCHC: 30.9 g/dL (ref 30.0–36.0)
MCV: 94.7 fL (ref 80.0–100.0)
Monocytes Absolute: 0.3 10*3/uL (ref 0.1–1.0)
Monocytes Relative: 6 %
Neutro Abs: 4.5 10*3/uL (ref 1.7–7.7)
Neutrophils Relative %: 76 %
Platelets: 315 10*3/uL (ref 150–400)
RBC: 4.55 MIL/uL (ref 3.87–5.11)
RDW: 14.7 % (ref 11.5–15.5)
WBC: 5.8 10*3/uL (ref 4.0–10.5)
nRBC: 0 % (ref 0.0–0.2)

## 2023-08-13 LAB — PHOSPHORUS: Phosphorus: 6.3 mg/dL — ABNORMAL HIGH (ref 2.5–4.6)

## 2023-08-13 LAB — MAGNESIUM: Magnesium: 2.2 mg/dL (ref 1.7–2.4)

## 2023-08-13 LAB — TSH: TSH: 15.43 u[IU]/mL — ABNORMAL HIGH (ref 0.350–4.500)

## 2023-08-13 MED ORDER — SODIUM CHLORIDE 0.45 % IV SOLN
INTRAVENOUS | Status: AC
  Administered 2023-08-13: 40 mL/h via INTRAVENOUS

## 2023-08-13 MED ORDER — ENOXAPARIN SODIUM 30 MG/0.3ML IJ SOSY
30.0000 mg | PREFILLED_SYRINGE | INTRAMUSCULAR | Status: DC
  Administered 2023-08-14 – 2023-08-21 (×8): 30 mg via SUBCUTANEOUS
  Filled 2023-08-13 (×8): qty 0.3

## 2023-08-13 MED ORDER — ACETAMINOPHEN 325 MG PO TABS
650.0000 mg | ORAL_TABLET | Freq: Four times a day (QID) | ORAL | Status: DC | PRN
  Administered 2023-08-16: 650 mg via ORAL
  Filled 2023-08-13 (×2): qty 2

## 2023-08-13 MED ORDER — ACETAMINOPHEN 650 MG RE SUPP: 650.0000 mg | Freq: Four times a day (QID) | RECTAL | Status: DC | PRN

## 2023-08-13 MED ORDER — ONDANSETRON HCL 4 MG PO TABS: 4.0000 mg | ORAL_TABLET | Freq: Four times a day (QID) | ORAL | Status: DC | PRN

## 2023-08-13 MED ORDER — ONDANSETRON HCL 4 MG/2ML IJ SOLN: 4.0000 mg | Freq: Four times a day (QID) | INTRAMUSCULAR | Status: DC | PRN

## 2023-08-13 MED ORDER — CALCIUM GLUCONATE-NACL 1-0.675 GM/50ML-% IV SOLN
1.0000 g | Freq: Once | INTRAVENOUS | Status: AC
  Administered 2023-08-13: 1000 mg via INTRAVENOUS
  Filled 2023-08-13: qty 50

## 2023-08-13 MED ORDER — SODIUM CHLORIDE 0.9 % IV BOLUS
500.0000 mL | Freq: Once | INTRAVENOUS | Status: AC
  Administered 2023-08-13: 500 mL via INTRAVENOUS

## 2023-08-13 NOTE — ED Notes (Signed)
 Date and time results received: 08/13/23 1803  (use smartphrase ".now" to insert current time)  Test: Ca Critical Value: 5.7  Name of Provider Notified: Dr. Dolan Freiberg  Orders Received? Or Actions Taken?:

## 2023-08-13 NOTE — ED Provider Notes (Signed)
 Emergency Department Provider Note   I have reviewed the triage vital signs and the nursing notes.   HISTORY  Chief Complaint Abnormal Labs (Calcium  5.9)   HPI [name] [name] is a 89 y.o. female with past history of CKD, hyperlipidemia, postsurgical hypothyroidism presents to the emergency department with weakness and poor appetite with increased sleepiness.  She lives with family who are at bedside to provide additional history.  She has been compliant with her medications but over the past several days has become increasingly fatigued and had very little appetite.  She went to have lab work done and her PCP this morning at which point she was found to have low calcium  and elevated LFTs.  No vomiting or reports of abdominal pain.  No fevers.  She has been compliant with her home medications including her thyroid  meds.   Level 5 caveat: AMS   Past Medical History:  Diagnosis Date   ALLERGIC RHINITIS 11/02/2006   CKD (chronic kidney disease), stage III (HCC)    HYPERLIPIDEMIA 11/04/2006   HYPOTHYROIDISM, POSTSURGICAL 03/28/2007   Malignant neoplasm of thyroid  gland (HCC) 03/28/2007   5/07: righte lobectomy--path shows 3 cm follicular ca 8/07: completion left lobectomy, no more tumor seen 09/07: thyroglobulin 0.2 9ab neg) 11/07: i-131 rx, 101 mci 6/08: thyroglogulin 0.2 (ab neg) , hypothyroid body scan neg (clinically tolerated hypothyroidism poorly) 06/09: tg undetectable (ab=1.5, normal) 12/10: tg undetectable (ab neg) 6/11: tg undectable (ab neg)   OSTEOPENIA 11/02/2006    Review of Systems  Constitutional: No fever/chills. Positive weakness.  Cardiovascular: Denies chest pain. Gastrointestinal: No abdominal pain. No vomiting.  Neurological: Negative for headaches.  ____________________________________________   PHYSICAL EXAM:  VITAL SIGNS: ED Triage Vitals [08/13/23 1640]  Encounter Vitals Group     BP (!) 143/95     Pulse Rate 85     Resp 16     Temp (!) 96.8 F (36  C)     Temp Source Axillary     SpO2 100 %     Weight 124 lb (56.2 kg)     Height 5\' 3"  (1.6 m)   Constitutional: Somnolent but responds to voice. No acute distress.  Eyes: Conjunctivae are normal. Head: Atraumatic. Nose: No congestion/rhinnorhea. Mouth/Throat: Mucous membranes are moist.  Cardiovascular: Normal rate, regular rhythm. Good peripheral circulation. Grossly normal heart sounds.   Respiratory: Normal respiratory effort.  No retractions. Lungs CTAB. Gastrointestinal: Soft and nontender. No distention.  Musculoskeletal: No gross deformities of extremities. Neurologic:  Normal speech and language.  Skin:  Skin is warm, dry and intact. No rash noted.  ____________________________________________   LABS (all labs ordered are listed, but only abnormal results are displayed)  Labs Reviewed  COMPREHENSIVE METABOLIC PANEL WITH GFR - Abnormal; Notable for the following components:      Result Value   Potassium 5.2 (*)    CO2 21 (*)    Glucose, Bld 125 (*)    BUN 47 (*)    Creatinine, Ser 1.85 (*)    Calcium  5.7 (*)    AST 107 (*)    ALT 97 (*)    Total Bilirubin 1.7 (*)    GFR, Estimated 25 (*)    Anion gap 16 (*)    All other components within normal limits  TSH - Abnormal; Notable for the following components:   TSH 15.430 (*)    All other components within normal limits  PHOSPHORUS - Abnormal; Notable for the following components:   Phosphorus 6.3 (*)  All other components within normal limits  T3, FREE - Abnormal; Notable for the following components:   T3, Free 0.9 (*)    All other components within normal limits  COMPREHENSIVE METABOLIC PANEL WITH GFR - Abnormal; Notable for the following components:   CO2 20 (*)    BUN 47 (*)    Creatinine, Ser 1.78 (*)    Calcium  5.7 (*)    AST 95 (*)    ALT 95 (*)    Total Bilirubin 1.9 (*)    GFR, Estimated 26 (*)    All other components within normal limits  CBC - Abnormal; Notable for the following components:    nRBC 0.5 (*)    All other components within normal limits  URINALYSIS, ROUTINE W REFLEX MICROSCOPIC - Abnormal; Notable for the following components:   Protein, ur 100 (*)    All other components within normal limits  T3 - Abnormal; Notable for the following components:   T3, Total <20 (*)    All other components within normal limits  T4, FREE - Abnormal; Notable for the following components:   Free T4 1.45 (*)    All other components within normal limits  CBC WITH DIFFERENTIAL/PLATELET  PARATHYROID HORMONE, INTACT (NO CA)  MAGNESIUM  T4  CALCIUM , IONIZED   ____________________________________________   PROCEDURES  Procedure(s) performed:   Procedures  CRITICAL CARE Performed by: Roberts Ching Total critical care time: 35 minutes Critical care time was exclusive of separately billable procedures and treating other patients. Critical care was necessary to treat or prevent imminent or life-threatening deterioration. Critical care was time spent personally by me on the following activities: development of treatment plan with patient and/or surrogate as well as nursing, discussions with consultants, evaluation of patient's response to treatment, examination of patient, obtaining history from patient or surrogate, ordering and performing treatments and interventions, ordering and review of laboratory studies, ordering and review of radiographic studies, pulse oximetry and re-evaluation of patient's condition.  Abby Hocking, MD Emergency Medicine  ____________________________________________   INITIAL IMPRESSION / ASSESSMENT AND PLAN / ED COURSE  Pertinent labs & imaging results that were available during my care of the patient were reviewed by me and considered in my medical decision making (see chart for details).   This patient is Presenting for Evaluation of weakness, which does require a range of treatment options, and is a complaint that involves a high risk of morbidity  and mortality.  The Differential Diagnoses include hypocalcemia, AKI, dehydration, sepsis, etc.  Critical Interventions-    Medications  enoxaparin (LOVENOX) injection 30 mg (30 mg Subcutaneous Given 08/15/23 1057)  0.45 % sodium chloride  infusion (0 mLs Intravenous Stopped 08/14/23 2144)  acetaminophen  (TYLENOL ) tablet 650 mg (has no administration in time range)    Or  acetaminophen  (TYLENOL ) suppository 650 mg (has no administration in time range)  ondansetron  (ZOFRAN ) tablet 4 mg (has no administration in time range)    Or  ondansetron  (ZOFRAN ) injection 4 mg (has no administration in time range)  docusate sodium  (COLACE) capsule 100 mg (100 mg Oral Given 08/14/23 2145)  triamcinolone  ointment (KENALOG ) 0.5 % 1 Application (1 Application Topical Given 08/15/23 1057)  calcitRIOL  (ROCALTROL ) capsule 0.5 mcg (0.5 mcg Oral Given 08/15/23 1057)  cholecalciferol (VITAMIN D3) 25 MCG (1000 UNIT) tablet 1,000 Units (1,000 Units Oral Given 08/15/23 1057)  levothyroxine  (SYNTHROID ) tablet 100 mcg (100 mcg Oral Given 08/15/23 0622)  calcium  citrate (CALCITRATE - dosed in mg elemental calcium ) tablet 950 mg (has no  administration in time range)  0.9 %  sodium chloride  infusion ( Intravenous New Bag/Given 08/15/23 1616)  sodium chloride  0.9 % bolus 500 mL (0 mLs Intravenous Stopped 08/13/23 1947)  calcium  gluconate 1 g/ 50 mL sodium chloride  IVPB (0 mg Intravenous Stopped 08/13/23 1947)  calcium  gluconate 2 g/ 100 mL sodium chloride  IVPB (2,000 mg Intravenous New Bag/Given 08/14/23 0820)  calcium  gluconate 1 g/ 50 mL sodium chloride  IVPB (1,000 mg Intravenous New Bag/Given 08/15/23 1616)    Reassessment after intervention:  symptoms improved.    I did obtain Additional Historical Information from family at bedside.   Clinical Laboratory Tests Ordered, included CBC without anemia or leukocytosis.   Radiologic Tests: Considered but LFTs are minimally elevated with normal bilirubin. No tenderness on exam. Defer  imaging for now.   Cardiac Monitor Tracing which shows NSR.    Social Determinants of Health Risk patient is a non-smoker.   Consult complete with [facility].   Medical Decision Making: Summary:  Patient presents emergency department for evaluation of weakness.  Found to be hypocalcemic.  Has history of surgical hypothyroidism.   Reevaluation with update and discussion with patient.  Calcium  is low.  Plan for admit.  Patient's presentation is most consistent with acute presentation with potential threat to life or bodily function.   Disposition: admit  ____________________________________________  FINAL CLINICAL IMPRESSION(S) / ED DIAGNOSES  Final diagnoses:  Hypocalcemia     Note:  This document was prepared using Dragon voice recognition software and may include unintentional dictation errors.  Abby Hocking, MD, [facility] Emergency Medicine    Stassi Fadely, Shereen Dike, MD 08/15/23 [phone]

## 2023-08-13 NOTE — ED Triage Notes (Signed)
 Sent by PCP for calcium  of 5.9 and increased liver enzymes that were drawn today. Family reports pt has had decreased appetite and decreased energy.

## 2023-08-13 NOTE — Telephone Encounter (Signed)
 Spoke with patient daughter, advise due to low Calcium  need to go to ED for evaluation  Verbalized understanding

## 2023-08-13 NOTE — ED Provider Triage Note (Signed)
 Emergency Medicine Provider Triage Evaluation Note  [name] [name] , a 89 y.o. female  was evaluated in triage.  Pt complains of low calcium  level taken today at her primary care provider.  Family member at bedside reports she has been little bit more confused than normal, but otherwise acting at baseline.  Reporting slight decrease in appetite.  Reports she does not have a thyroid .  Review of Systems  Positive: As above Negative: As above  Physical Exam  BP (!) 143/95   Pulse 85   Temp (!) 96.8 F (36 C) (Axillary)   Resp 16   Ht 5\' 3"  (1.6 m)   Wt 56.2 kg   SpO2 100%   BMI 21.97 kg/m  Gen:   Awake, no distress   Resp:  Normal effort  MSK:   Moves extremities without difficulty   Medical Decision Making  Medically screening exam initiated at 4:44 PM.  Appropriate orders placed.  [name] [name] was informed that the remainder of the evaluation will be completed by another provider, this initial triage assessment does not replace that evaluation, and the importance of remaining in the ED until their evaluation is complete.     Rexie Catena, PA-C 08/13/23 1645

## 2023-08-13 NOTE — Telephone Encounter (Signed)
 Hope from [facility] called with Critical Lab results  Calcium  Level low at 5.4 and verified   Verbal results given to Marnette Sinner  Send information to PCP

## 2023-08-13 NOTE — H&P (Signed)
 History and Physical    Patient: [name] [name] NGE:[id number] DOB: 1927-09-06 DOA: 08/13/2023 DOS: the patient was seen and examined on 08/13/2023 PCP: Rodney Clamp, MD  Patient coming from: {Point_of_Origin:[address]}  Chief Complaint:  Chief Complaint  Patient presents with   Abnormal Labs    Calcium  5.9   HPI: [name] [name] is a 89 y.o. female with medical history significant of ***  Review of Systems: {ROS_Text:26778} Past Medical History:  Diagnosis Date   ALLERGIC RHINITIS 11/02/2006   CKD (chronic kidney disease), stage III (HCC)    HYPERLIPIDEMIA 11/04/2006   HYPOTHYROIDISM, POSTSURGICAL 03/28/2007   Malignant neoplasm of thyroid  gland (HCC) 03/28/2007   5/07: righte lobectomy--path shows 3 cm follicular ca 8/07: completion left lobectomy, no more tumor seen 09/07: thyroglobulin 0.2 9ab neg) 11/07: i-131 rx, 101 mci 6/08: thyroglogulin 0.2 (ab neg) , hypothyroid body scan neg (clinically tolerated hypothyroidism poorly) 06/09: tg undetectable (ab=1.5, normal) 12/10: tg undetectable (ab neg) 6/11: tg undectable (ab neg)   OSTEOPENIA 11/02/2006   Past Surgical History:  Procedure Laterality Date   ABDOMINAL HYSTERECTOMY     CERVICAL POLYPECTOMY     OPEN REDUCTION INTERNAL FIXATION (ORIF) DISTAL RADIAL FRACTURE Left 01/14/2014   Procedure: OPEN REDUCTION INTERNAL FIXATION DISTAL RADIAL FRACTURE;  Surgeon: Shellie Dials, MD;  Location: [facility] OR;  Service: Orthopedics;  Laterality: Left;   Social History:  reports that she has never smoked. She has never used smokeless tobacco. She reports that she does not currently use alcohol. She reports that she does not use drugs.  Allergies  Allergen Reactions   Verapamil  Other (See Comments)    edema     Family History  Problem Relation Age of Onset   Diabetes Sister     Prior to Admission medications   Medication Sig Start Date End Date Taking? Authorizing Provider  calcitRIOL  (ROCALTROL ) 0.5 MCG capsule Take 1 capsule by mouth  once daily Patient taking differently: Take 0.5 mcg by mouth in the morning. 02/27/23  Yes Shamleffer, Ibtehal Jaralla, MD  docusate sodium  (COLACE) 100 MG capsule Take 100 mg by mouth at bedtime.    Yes [provider]  furosemide  (LASIX ) 40 MG tablet Take 1 tablet (40 mg total) by mouth daily as needed. Patient taking differently: Take 20 mg by mouth in the morning. 06/05/23  Yes Rodney Clamp, MD  gabapentin  (NEURONTIN ) 100 MG capsule Take 1 capsule (100 mg total) by mouth at bedtime. Patient taking differently: Take 100 mg by mouth as needed. 07/10/23  Yes Rodney Clamp, MD  levothyroxine  (SYNTHROID ) 88 MCG tablet Take 1 tablet by mouth once daily Patient taking differently: Take 88 mcg by mouth in the morning. 07/16/23  Yes Shamleffer, Ibtehal Jaralla, MD  triamcinolone  ointment (KENALOG ) 0.5 % Apply 1 Application topically 2 (two) times daily. as needed to affected area. 09/08/22  Yes Rodney Clamp, MD  valACYclovir  (VALTREX ) 1000 MG tablet Take 1 tablet (1,000 mg total) by mouth daily. Take 1 tablet daily for 5 days as needed for outbreaks Patient taking differently: Take 1,000 mg by mouth as needed. Take 1 tablet daily for 5 days as needed for outbreaks 03/19/23  Yes Rodney Clamp, MD  QUEtiapine  (SEROQUEL ) 50 MG tablet Take 1 tablet (50 mg total) by mouth at bedtime. Patient not taking: Reported on 08/13/2023 06/05/23   Rodney Clamp, MD  triamcinolone  ointment (KENALOG ) 0.5 % Apply 1 Application topically 2 (two) times daily. Patient not taking: Reported on 08/13/2023 06/05/23  Rodney Clamp, MD    Physical Exam: Vitals:   08/13/23 1640  BP: (!) 143/95  Pulse: 85  Resp: 16  Temp: (!) 96.8 F (36 C)  TempSrc: Axillary  SpO2: 100%  Weight: 56.2 kg  Height: 5\' 3"  (1.6 m)   *** Data Reviewed: {Tip this will not be part of the note when signed- Document your independent interpretation of telemetry tracing, EKG, lab, Radiology test or any other diagnostic tests. Add any  new diagnostic test ordered today. (Optional):26781} {Results:26384}  Assessment and Plan: No notes have been filed under this hospital service. Service: Hospitalist     Advance Care Planning:   Code Status: Limited: Do not attempt resuscitation (DNR) -DNR-LIMITED -Do Not Intubate/DNI  ***  Consults: ***  Family Communication: ***  Severity of Illness: {Observation/Inpatient:21159}  AuthorCarolin Chyle, MD 08/13/2023 7:28 PM  For on call review [email or web address].

## 2023-08-14 ENCOUNTER — Encounter: Payer: Self-pay | Admitting: Family Medicine

## 2023-08-14 DIAGNOSIS — E785 Hyperlipidemia, unspecified: Secondary | ICD-10-CM | POA: Diagnosis not present

## 2023-08-14 DIAGNOSIS — H9193 Unspecified hearing loss, bilateral: Secondary | ICD-10-CM

## 2023-08-14 DIAGNOSIS — N1832 Chronic kidney disease, stage 3b: Secondary | ICD-10-CM

## 2023-08-14 DIAGNOSIS — E890 Postprocedural hypothyroidism: Secondary | ICD-10-CM

## 2023-08-14 DIAGNOSIS — C73 Malignant neoplasm of thyroid gland: Secondary | ICD-10-CM

## 2023-08-14 LAB — URINALYSIS, ROUTINE W REFLEX MICROSCOPIC
Bacteria, UA: NONE SEEN
Bilirubin Urine: NEGATIVE
Glucose, UA: NEGATIVE mg/dL
Hgb urine dipstick: NEGATIVE
Ketones, ur: NEGATIVE mg/dL
Leukocytes,Ua: NEGATIVE
Nitrite: NEGATIVE
Protein, ur: 100 mg/dL — AB
Specific Gravity, Urine: 1.014 (ref 1.005–1.030)
pH: 5 (ref 5.0–8.0)

## 2023-08-14 LAB — CBC
HCT: 37.7 % (ref 36.0–46.0)
Hemoglobin: 12.1 g/dL (ref 12.0–15.0)
MCH: 29.7 pg (ref 26.0–34.0)
MCHC: 32.1 g/dL (ref 30.0–36.0)
MCV: 92.6 fL (ref 80.0–100.0)
Platelets: 283 10*3/uL (ref 150–400)
RBC: 4.07 MIL/uL (ref 3.87–5.11)
RDW: 14.7 % (ref 11.5–15.5)
WBC: 5.6 10*3/uL (ref 4.0–10.5)
nRBC: 0.5 % — ABNORMAL HIGH (ref 0.0–0.2)

## 2023-08-14 LAB — COMPREHENSIVE METABOLIC PANEL WITH GFR
ALT: 95 U/L — ABNORMAL HIGH (ref 0–44)
AST: 95 U/L — ABNORMAL HIGH (ref 15–41)
Albumin: 3.5 g/dL (ref 3.5–5.0)
Alkaline Phosphatase: 75 U/L (ref 38–126)
Anion gap: 15 (ref 5–15)
BUN: 47 mg/dL — ABNORMAL HIGH (ref 8–23)
CO2: 20 mmol/L — ABNORMAL LOW (ref 22–32)
Calcium: 5.7 mg/dL — CL (ref 8.9–10.3)
Chloride: 101 mmol/L (ref 98–111)
Creatinine, Ser: 1.78 mg/dL — ABNORMAL HIGH (ref 0.44–1.00)
GFR, Estimated: 26 mL/min — ABNORMAL LOW (ref >60)
Glucose, Bld: 98 mg/dL (ref 70–99)
Potassium: 4.2 mmol/L (ref 3.5–5.1)
Sodium: 136 mmol/L (ref 135–145)
Total Bilirubin: 1.9 mg/dL — ABNORMAL HIGH (ref 0.0–1.2)
Total Protein: 6.6 g/dL (ref 6.5–8.1)

## 2023-08-14 LAB — PARATHYROID HORMONE, INTACT (NO CA): PTH: 22 pg/mL (ref 15–65)

## 2023-08-14 LAB — T4, FREE: Free T4: 1.45 ng/dL — ABNORMAL HIGH (ref 0.61–1.12)

## 2023-08-14 MED ORDER — ACETAMINOPHEN ER 650 MG PO TBCR: 650.0000 mg | EXTENDED_RELEASE_TABLET | Freq: Four times a day (QID) | ORAL | Status: DC | PRN

## 2023-08-14 MED ORDER — VITAMIN D 25 MCG (1000 UNIT) PO TABS
1000.0000 [IU] | ORAL_TABLET | Freq: Every day | ORAL | Status: DC
  Administered 2023-08-14 – 2023-08-21 (×8): 1000 [IU] via ORAL
  Filled 2023-08-14 (×8): qty 1

## 2023-08-14 MED ORDER — CALCIUM GLUCONATE-NACL 2-0.675 GM/100ML-% IV SOLN
2.0000 g | Freq: Once | INTRAVENOUS | Status: AC
  Administered 2023-08-14: 2000 mg via INTRAVENOUS
  Filled 2023-08-14: qty 100

## 2023-08-14 MED ORDER — GABAPENTIN 100 MG PO CAPS
100.0000 mg | ORAL_CAPSULE | Freq: Every day | ORAL | Status: DC
  Administered 2023-08-14: 100 mg via ORAL
  Filled 2023-08-14: qty 1

## 2023-08-14 MED ORDER — CALCITRIOL 0.5 MCG PO CAPS
0.5000 ug | ORAL_CAPSULE | Freq: Every day | ORAL | Status: DC
  Administered 2023-08-14 – 2023-08-21 (×8): 0.5 ug via ORAL
  Filled 2023-08-14 (×8): qty 1

## 2023-08-14 MED ORDER — TRIAMCINOLONE ACETONIDE 0.5 % EX OINT
1.0000 | TOPICAL_OINTMENT | Freq: Two times a day (BID) | CUTANEOUS | Status: DC
  Administered 2023-08-14 – 2023-08-21 (×14): 1 via TOPICAL
  Filled 2023-08-14: qty 15

## 2023-08-14 MED ORDER — DOCUSATE SODIUM 100 MG PO CAPS
100.0000 mg | ORAL_CAPSULE | Freq: Every day | ORAL | Status: DC
  Administered 2023-08-14 – 2023-08-20 (×7): 100 mg via ORAL
  Filled 2023-08-14 (×7): qty 1

## 2023-08-14 MED ORDER — QUETIAPINE FUMARATE 50 MG PO TABS: 50.0000 mg | ORAL_TABLET | Freq: Every day | ORAL | Status: DC

## 2023-08-14 MED ORDER — LEVOTHYROXINE SODIUM 100 MCG PO TABS
100.0000 ug | ORAL_TABLET | Freq: Every day | ORAL | Status: DC
  Administered 2023-08-14 – 2023-08-21 (×8): 100 ug via ORAL
  Filled 2023-08-14 (×8): qty 1

## 2023-08-14 NOTE — Telephone Encounter (Signed)
 Agree with dispo  Jinny Mounts. Daneil Dunker, MD 08/14/2023 8:55 AM

## 2023-08-14 NOTE — Plan of Care (Signed)

## 2023-08-14 NOTE — Progress Notes (Signed)
 Progress Note   Patient: [name] [name] ZOX:[id number] DOB: [birth year] DOA: 08/13/2023     1 DOS: the patient was seen and examined on 08/14/2023   Brief hospital course: [name] [name] is a 89 y.o. female with medical history significant of postsurgical hypothyroidism, history of thyroid  cancer, recurrent UTIs, chronic kidney disease stage III, hyperlipidemia, who lives with family at home found to have markedly low calcium , elevated TSH.  She is admitted for further management and evaluation.   Assessment and Plan: Severe hypocalcemia:  Poor compliance with medications. Continue patient's home dose calcitriol .   She has postsurgical hypoparathyroidism.  IV calcium  order placed yesterday and today. Continue oral calcium  carbonate on a regular basis.  PTH level 22. Continue the calcitriol .   Hypothyroidism: Postsurgical.  TSH elevated at 15.  Dose adjustment more than likely needed.  Check T3 and free T4 (add to prior labs)   History of recurrent UTIs: No symptoms at this point.  Urinalysis unremarkable.   Elevated LFTs: Could be reactive.  Mildly elevated.  Continue to trend. Check RUQ sono.   Chronic kidney disease stage IV:  Avoid nephrotoxic drugs. Continue to monitor daily renal function.   Severe hearing loss:  Poor historian due to hearing loss. Patient will need some hearing aid to communicate better.   Essential hypertension: Continue home blood pressure medications.        Out of bed to chair. Incentive spirometry. Nursing supportive care. Fall, aspiration precautions. Diet:  Diet Orders (From admission, onward)     Start     Ordered   08/13/23 2057  Diet Heart Room service appropriate? Yes; Fluid consistency: Thin  Diet effective now       Question Answer Comment  Room service appropriate? Yes   Fluid consistency: Thin      08/13/23 2056           DVT prophylaxis: enoxaparin (LOVENOX) injection 30 mg Start: 08/14/23 0800  Level of care: Telemetry    Code Status: Limited: Do not attempt resuscitation (DNR) -DNR-LIMITED -Do Not Intubate/DNI   Subjective: Patient is seen and examined today morning. She is sitting in chair. Has hard of hearing, poor historian.  Physical Exam: Vitals:   08/13/23 2100 08/14/23 0138 08/14/23 0436 08/14/23 1525  BP:  136/89 (!) 127/93 (!) 134/97  Pulse:  83 80 88  Resp:  18 18   Temp:  (!) 97.5 F (36.4 C) 97.6 F (36.4 C) (!) 97.5 F (36.4 C)  TempSrc:  Oral Oral Oral  SpO2:  90% 95% 100%  Weight: 67.8 kg     Height: 5\' 4"  (1.626 m)       General - Elderly Caucasian female, no apparent distress. HEENT - PERRLA, EOMI, atraumatic head, hard of hearing. Lung - Clear, basal rales, no rhonchi, wheezes. Heart - S1, S2 heard, no murmurs, rubs, trace pedal edema. Abdomen - Soft, non tender, bowel sounds good Neuro - Alert, awake and oriented, non focal exam. Skin - Warm and dry.  Data Reviewed:      Latest Ref Rng & Units 08/14/2023    4:39 AM 08/13/2023    5:21 PM 07/20/2023    1:58 PM  CBC  WBC 4.0 - 10.5 K/uL 5.6  5.8  8.3   Hemoglobin 12.0 - 15.0 g/dL 81.1  91.4  78.2   Hematocrit 36.0 - 46.0 % 37.7  43.1  34.6   Platelets 150 - 400 K/uL 283  315  304.0  Latest Ref Rng & Units 08/14/2023    4:39 AM 08/13/2023    5:21 PM 08/13/2023   10:57 AM  BMP  Glucose 70 - 99 mg/dL 98  914  782   BUN 8 - 23 mg/dL 47  47  46   Creatinine 0.44 - 1.00 mg/dL 9.56  2.13  0.86   Sodium 135 - 145 mmol/L 136  137  137   Potassium 3.5 - 5.1 mmol/L 4.2  5.2  4.0   Chloride 98 - 111 mmol/L 101  100  99   CO2 22 - 32 mmol/L 20  21  24    Calcium  8.9 - 10.3 mg/dL 5.7  5.7  5.4 Repeated and verified X2.    No results found.   Disposition: Status is: Inpatient Remains inpatient appropriate because: hypocalcemia, need close monitoring  Planned Discharge Destination: Home with Home Health     Time spent: 41 minutes  Author: Aisha Hove, MD 08/14/2023 4:12 PM Secure chat 7am to 7pm For on call  review [email or web address].

## 2023-08-14 NOTE — Evaluation (Signed)
 Occupational Therapy Evaluation Patient Details Name: [name] [name] MRN: [id number] DOB: [birth year] Today's Date: 08/14/2023   History of Present Illness   YASSMIN WECK is a 89 y.o female with severe hypocalcemia. PMH: postsurgical hypothyroidism, history of thyroid  cancer, recurrent UTIs, chronic kidney disease stage III, hyperlipidemia     Clinical Impressions  Patient with severe HOH thus OT phone call to daughter Abe Abed for PLOF. PTA, patient was living with family in her daughter's home requiring increased assistance the past few weeks usin a RW short household distances and receiving assistance for BADL's and IADL's. Currently, patient presents with deficits outlined below ( see OT Problem list for details) most significantly generalized muscle weakness, decreased balance, activity tolerance, hearing, vision and cognition limiting BADL's and functional mobility. Patient will benefit from continued inpatient follow up therapy, <3 hours/day. OT will follow acutely to progress function and safety.      If plan is discharge home, recommend the following:   A lot of help with walking and/or transfers;A lot of help with bathing/dressing/bathroom;Assistance with cooking/housework;Assistance with feeding;Direct supervision/assist for medications management;Direct supervision/assist for financial management;Assist for transportation;Help with stairs or ramp for entrance;Supervision due to cognitive status     Functional Status Assessment   Patient has had a recent decline in their functional status and demonstrates the ability to make significant improvements in function in a reasonable and predictable amount of time.     Equipment Recommendations   None recommended by OT      Precautions/Restrictions   Precautions Precautions: Fall Precaution/Restrictions Comments: severe HOH Restrictions Weight Bearing Restrictions Per Provider Order: No     Mobility Bed Mobility Overal  bed mobility: Needs Assistance Bed Mobility: Rolling, Supine to Sit Rolling: Min assist   Supine to sit: Min assist, HOB elevated, Used rails     General bed mobility comments: mod-max cues for directions    Transfers Overall transfer level: Needs assistance Equipment used: Rolling walker (2 wheels) Transfers: Sit to/from Stand, Bed to chair/wheelchair/BSC Sit to Stand: Mod assist, From elevated surface Stand pivot transfers: Mod assist, From elevated surface         General transfer comment: max cues for directionality, hand placement      Balance Overall balance assessment: Needs assistance Sitting-balance support: Single extremity supported, Feet supported Sitting balance-Leahy Scale: Fair   Postural control: Posterior lean Standing balance support: Bilateral upper extremity supported, Reliant on assistive device for balance Standing balance-Leahy Scale: Poor                             ADL either performed or assessed with clinical judgement   ADL Overall ADL's : Needs assistance/impaired Eating/Feeding: Set up;Sitting;Cueing for sequencing   Grooming: Wash/dry hands;Wash/dry face;Minimal assistance;Sitting;Cueing for sequencing   Upper Body Bathing: Moderate assistance;Sitting;Cueing for sequencing   Lower Body Bathing: Maximal assistance;Cueing for sequencing;Cueing for safety;Sitting/lateral leans;Sit to/from stand   Upper Body Dressing : Minimal assistance;Sitting;Cueing for sequencing   Lower Body Dressing: Maximal assistance;Sitting/lateral leans;Sit to/from stand;Bed level   Toilet Transfer: Moderate assistance;BSC/3in1;Rolling walker (2 wheels)   Toileting- Clothing Manipulation and Hygiene: Maximal assistance;Sitting/lateral lean;Sit to/from stand       Functional mobility during ADLs: Moderate assistance;Rolling walker (2 wheels) General ADL Comments: max vc's due to [facility] and cog/safety deficits     Vision Baseline Vision/History:   (difficulty reading clock on wall, reports bluriness) Ability to See in Adequate Light: 1 Impaired Patient Visual Report: Blurring of vision  Perception Perception: Impaired Preception Impairment Details: Spatial orientation     Praxis Praxis: Impaired Praxis Impairment Details: Motor planning, Organization     Pertinent Vitals/Pain Pain Assessment Pain Assessment: Faces Faces Pain Scale: No hurt     Extremity/Trunk Assessment Upper Extremity Assessment Upper Extremity Assessment: Right hand dominant;Generalized weakness (B OA changes shoulders 0-110 Bly)   Lower Extremity Assessment Lower Extremity Assessment: Generalized weakness   Cervical / Trunk Assessment Cervical / Trunk Assessment: Kyphotic   Communication Communication Communication: Impaired Factors Affecting Communication: Hearing impaired   Cognition Arousal: Alert Behavior During Therapy: WFL for tasks assessed/performed Cognition: Cognition impaired   Orientation impairments: Place, Time, Situation Awareness: Intellectual awareness impaired, Online awareness impaired Memory impairment (select all impairments): Short-term memory Attention impairment (select first level of impairment): Focused attention Executive functioning impairment (select all impairments): Initiation, Organization, Sequencing, Reasoning, Problem solving OT - Cognition Comments: severe HOH impacts sinificantly                 Following commands: Impaired Following commands impaired: Follows one step commands inconsistently     Cueing  General Comments   Cueing Techniques: Verbal cues;Gestural cues;Tactile cues;Visual cues  SpO2 remained 96% on RA           Home Living Family/patient expects to be discharged to:: Private residence Living Arrangements: Children Available Help at Discharge: Family Type of Home: House Home Access: Stairs to enter Secretary/administrator of Steps: 2 Entrance Stairs-Rails:  Right;Left;Can reach both Home Layout: One level     Bathroom Shower/Tub: Tub/shower unit;Sponge bathes at baseline   [employer]: Standard Bathroom Accessibility: Yes How Accessible: Accessible via walker Home Equipment: Rolling Walker (2 wheels)   Additional Comments: patient has been at her daughter's home for the past 7 weeks, using 3 in 1 commode or toileting, RW and sponge bathing      Prior Functioning/Environment Prior Level of Function : Needs assist;Other (comment);Patient poor historian/Family not available (daughter Abe Abed provided updated plof over phone)  Cognitive Assist : Mobility (cognitive);ADLs (cognitive) Mobility (Cognitive): Step by step cues ADLs (Cognitive): Step by step cues Physical Assist : Mobility (physical);ADLs (physical) Mobility (physical): Transfers;Gait;Stairs ADLs (physical): Bathing;Dressing;Toileting;IADLs Mobility Comments: very short distance assisted amb with Rw the past few weeks ADLs Comments: family reports patient sponge bathing and needing assist with BADL's and all IADL's the past few weeks    OT Problem List: Decreased strength;Decreased range of motion;Decreased activity tolerance;Impaired balance (sitting and/or standing);Impaired vision/perception;Decreased coordination;Decreased cognition;Decreased safety awareness;Decreased knowledge of use of DME or AE;Decreased knowledge of precautions;Cardiopulmonary status limiting activity   OT Treatment/Interventions: Self-care/ADL training;Therapeutic exercise;Neuromuscular education;Energy conservation;DME and/or AE instruction;Therapeutic activities;Cognitive remediation/compensation;Visual/perceptual remediation/compensation;Patient/family education;Balance training      OT Goals(Current goals can be found in the care plan section)   Acute Rehab OT Goals Patient Stated Goal: patient unable to state- daughter- to be stronger OT Goal Formulation: With patient/family Time For Goal  Achievement: 08/28/23 Potential to Achieve Goals: Fair ADL Goals Pt Will Perform Grooming: with set-up;sitting Pt Will Perform Upper Body Bathing: with set-up;sitting Pt Will Perform Upper Body Dressing: with supervision;sitting Pt Will Transfer to Toilet: with contact guard assist;bedside commode;grab bars   OT Frequency:  Min 2X/week       AM-PAC OT "6 Clicks" Daily Activity     Outcome Measure Help from another person eating meals?: A Little Help from another person taking care of personal grooming?: A Little Help from another person toileting, which includes using toliet, bedpan, or urinal?: A Lot Help from  another person bathing (including washing, rinsing, drying)?: A Lot Help from another person to put on and taking off regular upper body clothing?: A Little Help from another person to put on and taking off regular lower body clothing?: A Lot 6 Click Score: 15   End of Session Equipment Utilized During Treatment: Gait belt;Rolling walker (2 wheels) Nurse Communication: Mobility status  Activity Tolerance: Patient tolerated treatment well Patient left: in chair;with call bell/phone within reach;with chair alarm set  OT Visit Diagnosis: Unsteadiness on feet (R26.81);History of falling (Z91.81);Muscle weakness (generalized) (M62.81);Low vision, both eyes (H54.2);Cognitive communication deficit (R41.841)                Time: 4098-1191 OT Time Calculation (min): 28 min Charges:  OT General Charges $OT Visit: 1 Visit OT Evaluation $OT Eval Moderate Complexity: 1 Mod OT Treatments $Self Care/Home Management : 8-22 mins  Daelynn Blower OT/L Acute Rehabilitation Department  [phone]  08/14/2023, 11:31 AM

## 2023-08-14 NOTE — Progress Notes (Signed)
 Patient currently hospitalized for low calcium .  We can recheck when she comes back here for follow-up visit.

## 2023-08-14 NOTE — Evaluation (Signed)
 Physical Therapy Evaluation Patient Details Name: [name] [name] MRN: [id number] DOB: 1928-02-03 Today's Date: 08/14/2023  History of Present Illness  89 y.o female with severe hypocalcemia. PMH: postsurgical hypothyroidism, thyroid  cancer, recurrent UTIs, chronic kidney disease stage III, hyperlipidemia, ORIF L radial fx, osteopenia  Clinical Impression  On eval, pt required Mod A for mobility. She was able to ambulate ~40 feet with a RW. Pt presents with general weakness, decreased activity tolerance, and impaired gait/balance. Pt is at risk for falls when mobilizing. She is very HOH. Family present during session to provide history/PLOF/most recent performance level due to illness. Family is agreeable to post acute rehab. Patient will benefit from continued inpatient follow up therapy, <3 hours/day         If plan is discharge home, recommend the following: A lot of help with walking and/or transfers;A lot of help with bathing/dressing/bathroom;Assistance with cooking/housework;Assist for transportation;Help with stairs or ramp for entrance   Can travel by private vehicle        Equipment Recommendations None recommended by PT  Recommendations for Other Services       Functional Status Assessment Patient has had a recent decline in their functional status and demonstrates the ability to make significant improvements in function in a reasonable and predictable amount of time.     Precautions / Restrictions Precautions Precautions: Fall Precaution/Restrictions Comments: HOH Restrictions Weight Bearing Restrictions Per Provider Order: No      Mobility  Bed Mobility               General bed mobility comments: oob in recliner    Transfers Overall transfer level: Needs assistance Equipment used: Rolling walker (2 wheels) Transfers: Sit to/from Stand Sit to Stand: Mod assist           General transfer comment: Assist to rise, steady, control descent. Increased time  for pt to gain balance in preparation for ambulation. Cues required.    Ambulation/Gait Ambulation/Gait assistance: Mod assist Gait Distance (Feet): 40 Feet Assistive device: Rolling walker (2 wheels) Gait Pattern/deviations: Step-through pattern, Decreased stride length       General Gait Details: Assist to stabilize pt and manage RW. Pt reported fatigue after ~20 feet but she was eventually able to ambulate back to room. Fall risk.  Stairs            Wheelchair Mobility     Tilt Bed    Modified Rankin (Stroke Patients Only)       Balance Overall balance assessment: Needs assistance         Standing balance support: Bilateral upper extremity supported, Reliant on assistive device for balance Standing balance-Leahy Scale: Poor                               Pertinent Vitals/Pain Pain Assessment Pain Assessment: Faces Faces Pain Scale: No hurt    Home Living Family/patient expects to be discharged to:: Skilled nursing facility Living Arrangements: Children Available Help at Discharge: Family Type of Home: House Home Access: Stairs to enter Entrance Stairs-Rails: Right;Left;Can reach both Entrance Stairs-Number of Steps: 2   Home Layout: One level Home Equipment: Agricultural consultant (2 wheels) Additional Comments: patient has been at her daughter's home for the past 7 weeks, using 3 in 1 commode or toileting, RW and sponge bathing    Prior Function Prior Level of Function : Needs assist  Mobility Comments: very short distance assisted amb with Rw the past few weeks ADLs Comments: family reports patient sponge bathing and needing assist with BADL's and all IADL's the past few weeks     Extremity/Trunk Assessment   Upper Extremity Assessment Upper Extremity Assessment: Defer to OT evaluation    Lower Extremity Assessment Lower Extremity Assessment: Generalized weakness    Cervical / Trunk Assessment Cervical / Trunk  Assessment: Kyphotic  Communication   Communication Communication: Impaired Factors Affecting Communication: Hearing impaired    Cognition Arousal: Alert Behavior During Therapy: WFL for tasks assessed/performed   PT - Cognitive impairments: Difficult to assess Difficult to assess due to: Hard of hearing/deaf                         Following commands impaired: Follows one step commands with increased time     Cueing Cueing Techniques: Verbal cues, Gestural cues, Tactile cues, Visual cues     General Comments      Exercises     Assessment/Plan    PT Assessment Patient needs continued PT services  PT Problem List Decreased strength;Decreased range of motion;Decreased activity tolerance;Decreased balance;Decreased mobility;Decreased knowledge of use of DME       PT Treatment Interventions DME instruction;Gait training;Functional mobility training;Therapeutic activities;Therapeutic exercise;Patient/family education;Balance training    PT Goals (Current goals can be found in the Care Plan section)  Acute Rehab PT Goals Patient Stated Goal: family agreeable to rehab for pt to get stronger PT Goal Formulation: With family Time For Goal Achievement: 08/28/23 Potential to Achieve Goals: Good    Frequency Min 2X/week     Co-evaluation               AM-PAC PT "6 Clicks" Mobility  Outcome Measure Help needed turning from your back to your side while in a flat bed without using bedrails?: A Lot Help needed moving from lying on your back to sitting on the side of a flat bed without using bedrails?: A Lot Help needed moving to and from a bed to a chair (including a wheelchair)?: A Lot Help needed standing up from a chair using your arms (e.g., wheelchair or bedside chair)?: A Lot Help needed to walk in hospital room?: A Lot Help needed climbing 3-5 steps with a railing? : Total 6 Click Score: 11    End of Session Equipment Utilized During Treatment: Gait  belt Activity Tolerance: Patient tolerated treatment well;Patient limited by fatigue Patient left: in chair;with call bell/phone within reach;with chair alarm set;with family/visitor present   PT Visit Diagnosis: Muscle weakness (generalized) (M62.81);Difficulty in walking, not elsewhere classified (R26.2)    Time: 1610-9604 PT Time Calculation (min) (ACUTE ONLY): 15 min   Charges:   PT Evaluation $PT Eval Low Complexity: 1 Low   PT General Charges $$ ACUTE PT VISIT: 1 Visit          Tanda Falter, PT Acute Rehabilitation  Office: [phone]

## 2023-08-15 ENCOUNTER — Inpatient Hospital Stay (HOSPITAL_COMMUNITY)

## 2023-08-15 ENCOUNTER — Telehealth: Payer: Self-pay

## 2023-08-15 DIAGNOSIS — E890 Postprocedural hypothyroidism: Secondary | ICD-10-CM | POA: Diagnosis not present

## 2023-08-15 DIAGNOSIS — G9341 Metabolic encephalopathy: Secondary | ICD-10-CM

## 2023-08-15 DIAGNOSIS — E785 Hyperlipidemia, unspecified: Secondary | ICD-10-CM | POA: Diagnosis not present

## 2023-08-15 DIAGNOSIS — N1832 Chronic kidney disease, stage 3b: Secondary | ICD-10-CM | POA: Diagnosis not present

## 2023-08-15 LAB — T4: T4, Total: 6.8 ug/dL (ref 4.5–12.0)

## 2023-08-15 LAB — T3: T3, Total: <20 ng/dL — ABNORMAL LOW (ref 71–180)

## 2023-08-15 LAB — T3, FREE: T3, Free: 0.9 pg/mL — ABNORMAL LOW (ref 2.0–4.4)

## 2023-08-15 MED ORDER — CALCIUM CITRATE 950 (200 CA) MG PO TABS
200.0000 mg | ORAL_TABLET | Freq: Every day | ORAL | Status: DC
  Administered 2023-08-15 – 2023-08-17 (×3): 950 mg via ORAL
  Filled 2023-08-15 (×5): qty 1

## 2023-08-15 MED ORDER — CALCIUM GLUCONATE-NACL 1-0.675 GM/50ML-% IV SOLN
1.0000 g | Freq: Once | INTRAVENOUS | Status: AC
  Administered 2023-08-15: 1000 mg via INTRAVENOUS
  Filled 2023-08-15: qty 50

## 2023-08-15 MED ORDER — SODIUM CHLORIDE 0.9 % IV SOLN: INTRAVENOUS | Status: AC | PRN

## 2023-08-15 NOTE — Progress Notes (Signed)
 Progress Note   Patient: [name] [name]:[id number] DOB: March 02, 1928 DOA: 08/13/2023     2 DOS: the patient was seen and examined on 08/15/2023   Brief hospital course: [name] [name] is a 89 y.o. female with medical history significant of postsurgical hypothyroidism, history of thyroid  cancer, recurrent UTIs, chronic kidney disease stage III, hyperlipidemia, who lives with family at home found to have markedly low calcium , elevated TSH.  She is admitted for further management and evaluation.   Assessment and Plan: Severe hypocalcemia:  Calcium  5.7, ionized calcium  ordered. Poor compliance with medications per family at bedside. Continue patient's home dose calcitriol .   She has postsurgical hypoparathyroidism. She got IV calcium  gluconate x2. Continue oral calcium  carbonate on a regular basis.  PTH level 22. Continue calcitriol  treatment. Will get endocrine input for further management.   Hypothyroidism: Postsurgical.  TSH elevated at 15. Free T4 1.45, T3<20. Levothyroxine  dose changed from 88 to 100.  Called her primary endocrinology, awaiting response.  Acute metabolic encephalopathy- In the setting of electrolyte abnormalities. Will get head CT r/o intracranial pathology. Gentle IV hydration as she is eating poor. Avoid sedatives, opiates. Stopped gabapentin . Continue neuro checks.   History of recurrent UTIs:  Urinalysis unremarkable.   Elevated LFTs: trending down. Check RUQ sono.   Chronic kidney disease stage IV:  Avoid nephrotoxic drugs. Continue to monitor daily renal function.   Severe hearing loss:  Poor historian due to hearing loss. Patient will need some hearing aid to communicate better. Family states they sometimes use writing to communicate.   Essential hypertension: Continue home blood pressure medications.    Out of bed to chair. Incentive spirometry. Nursing supportive care. Fall, aspiration precautions. Diet:  Diet Orders (From admission, onward)      Start     Ordered   08/13/23 2057  Diet Heart Room service appropriate? Yes; Fluid consistency: Thin  Diet effective now       Question Answer Comment  Room service appropriate? Yes   Fluid consistency: Thin      08/13/23 2056           DVT prophylaxis: enoxaparin (LOVENOX) injection 30 mg Start: 08/14/23 0800  Level of care: Telemetry   Code Status: Limited: Do not attempt resuscitation (DNR) -DNR-LIMITED -Do Not Intubate/DNI   Subjective: Patient is seen and examined today morning. She is more sleepy today. Opened eyes once with tactile stimulus. Family at bedside concerned as she is awake mornings.  Physical Exam: Vitals:   08/14/23 1525 08/14/23 2147 08/15/23 0502 08/15/23 0503  BP: (!) 134/97 (!) 138/99 (!) 131/95 124/89  Pulse: 88 80 85 73  Resp:  (!) 24 20   Temp: (!) 97.5 F (36.4 C) (!) 97.5 F (36.4 C) 98.4 F (36.9 C)   TempSrc: Oral Oral Oral   SpO2: 100% 100% 90% 100%  Weight:      Height:        General - Elderly Caucasian ill female, no apparent distress. HEENT - PERRLA, EOMI, atraumatic head, hard of hearing. Lung - Clear, basal rales, no rhonchi, wheezes. Heart - S1, S2 heard, no murmurs, rubs, trace pedal edema. Abdomen - Soft, non tender, bowel sounds good Neuro - Sleepy, did not respond much, unable to do full neuro exam. Skin - Warm and dry.  Data Reviewed:      Latest Ref Rng & Units 08/14/2023    4:39 AM 08/13/2023    5:21 PM 07/20/2023    1:58 PM  CBC  WBC  4.0 - 10.5 K/uL 5.6  5.8  8.3   Hemoglobin 12.0 - 15.0 g/dL 16.1  09.6  04.5   Hematocrit 36.0 - 46.0 % 37.7  43.1  34.6   Platelets 150 - 400 K/uL 283  315  304.0       Latest Ref Rng & Units 08/14/2023    4:39 AM 08/13/2023    5:21 PM 08/13/2023   10:57 AM  BMP  Glucose 70 - 99 mg/dL 98  409  811   BUN 8 - 23 mg/dL 47  47  46   Creatinine 0.44 - 1.00 mg/dL 9.14  7.82  9.56   Sodium 135 - 145 mmol/L 136  137  137   Potassium 3.5 - 5.1 mmol/L 4.2  5.2  4.0   Chloride 98 - 111  mmol/L 101  100  99   CO2 22 - 32 mmol/L 20  21  24    Calcium  8.9 - 10.3 mg/dL 5.7  5.7  5.4 Repeated and verified X2.    No results found.  Family discussion- Discussed with patient's family at bedside. They understand and agree.  Disposition: Status is: Inpatient Remains inpatient appropriate because: hypocalcemia, need close neurologic monitoring  Planned Discharge Destination: Home with Home Health     Time spent: 41 minutes  Author: Aisha Hove, MD 08/15/2023 12:11 PM Secure chat 7am to 7pm For on call review [email or web address].

## 2023-08-15 NOTE — Progress Notes (Signed)
   08/15/23 1140  Provider Notification  Provider Name/Title Dr. Butch Cashing  Date Provider Notified 08/15/23  Time Provider Notified 1144  Method of Notification Page (secure chat)  Notification Reason Other (Comment) (difficult to arouse)  Provider response See new orders  Date of Provider Response 08/15/23  Time of Provider Response 1215

## 2023-08-15 NOTE — Telephone Encounter (Signed)
 Dr. Butch Cashing from [place] would like to speak with you about patient calcium  being low.   [phone]

## 2023-08-15 NOTE — NC FL2 (Signed)
 [place]  MEDICAID FL2 LEVEL OF CARE FORM     IDENTIFICATION  Patient Name: [name] [name] Birthdate: [birth year] Sex: female Admission Date (Current Location): 08/13/2023  [facility] and [place] Number:  Producer, television/film/video and Address:  [facility],  [address], [place] [address]      Provider Number: [id number]  Attending Physician Name and Address:  Aisha Hove, MD  Relative Name and Phone Number:  Gussie Legato (Daughter)  [phone] (Home Phone)    Current Level of Care: Hospital Recommended Level of Care: Skilled Nursing Facility Prior Approval Number:    Date Approved/Denied:   PASRR Number: [id number]  Discharge Plan: SNF    Current Diagnoses: Patient Active Problem List   Diagnosis Date Noted   Hypocalcemia 08/13/2023   Insomnia 06/05/2023   Cognitive impairment 06/05/2023   Osteoarthritis 01/17/2022   Pulmonary nodule 11/14/2021   Pulmonary embolism (HCC) 10/17/2021   Essential hypertension 01/11/2021   Leg edema 12/01/2020   Plantar flexed metatarsal bone of left foot 11/24/2020   Plantar flexed metatarsal bone of right foot 11/24/2020   Chronic sinusitis 07/17/2019   Right leg weakness 10/15/2018   Corns and callus 09/25/2018   Shingles 04/05/2018   HLD (hyperlipidemia) 08/05/2017   CKD (chronic kidney disease), stage III (HCC) 08/05/2017   Neuropathy 11/19/2014   Malignant neoplasm of thyroid  gland (HCC) 03/28/2007   HYPOTHYROIDISM, POSTSURGICAL 03/28/2007   HYPOPARATHYROIDISM 03/28/2007   Allergic rhinitis 11/02/2006   Disorder of bone and cartilage 11/02/2006    Orientation RESPIRATION BLADDER Height & Weight     Self  Normal Continent Weight: 149 lb 7.6 oz (67.8 kg) Height:  5\' 4"  (162.6 cm)  BEHAVIORAL SYMPTOMS/MOOD NEUROLOGICAL BOWEL NUTRITION STATUS      Continent Diet (heart)  AMBULATORY STATUS COMMUNICATION OF NEEDS Skin   Limited Assist Verbally PU Stage and Appropriate Care (Left Buttocks)   PU Stage 2  Dressing:  (PRN)                   Personal Care Assistance Level of Assistance  Bathing, Feeding, Dressing Bathing Assistance: Limited assistance Feeding assistance: Independent Dressing Assistance: Limited assistance     Functional Limitations Info  Sight, Hearing, Speech Sight Info: Impaired (Glasses) Hearing Info: Impaired (hard of hearing) Speech Info: Adequate    SPECIAL CARE FACTORS FREQUENCY  PT (By licensed PT), OT (By licensed OT)     PT Frequency: 5 x a week OT Frequency: 5 x a week            Contractures Contractures Info: Not present    Additional Factors Info  Code Status, Allergies Code Status Info: DNR Allergies Info: Verapamil            Current Medications (08/15/2023):  This is the current hospital active medication list Current Facility-Administered Medications  Medication Dose Route Frequency Provider Last Rate Last Admin   acetaminophen  (TYLENOL ) tablet 650 mg  650 mg Oral Q6H PRN Davida Espy, MD       Or   acetaminophen  (TYLENOL ) suppository 650 mg  650 mg Rectal Q6H PRN Davida Espy, MD       calcitRIOL  (ROCALTROL ) capsule 0.5 mcg  0.5 mcg Oral Daily Sudie Ely L, MD   0.5 mcg at 08/15/23 1057   cholecalciferol (VITAMIN D3) 25 MCG (1000 UNIT) tablet 1,000 Units  1,000 Units Oral Daily Davida Espy, MD   1,000 Units at 08/15/23 1057   docusate sodium  (COLACE) capsule 100 mg  100 mg  Oral QHS Sudie Ely L, MD   100 mg at 08/14/23 2145   enoxaparin (LOVENOX) injection 30 mg  30 mg Subcutaneous Q24H Sudie Ely L, MD   30 mg at 08/15/23 1057   levothyroxine  (SYNTHROID ) tablet 100 mcg  100 mcg Oral Q0600 Davida Espy, MD   100 mcg at 08/15/23 1610   ondansetron  (ZOFRAN ) tablet 4 mg  4 mg Oral Q6H PRN Davida Espy, MD       Or   ondansetron  (ZOFRAN ) injection 4 mg  4 mg Intravenous Q6H PRN Garba, Mohammad L, MD       triamcinolone  ointment (KENALOG ) 0.5 % 1 Application  1 Application Topical BID Davida Espy, MD   1 Application at 08/15/23 1057     Discharge Medications: Please see discharge summary for a list of discharge medications.  Relevant Imaging Results:  Relevant Lab Results:   Additional Information SSN :[id number]  Arta Lark Culver Feighner, LCSW

## 2023-08-15 NOTE — Telephone Encounter (Signed)
 Spoke to Dr. Butch Cashing,  Patient with elevated TSH and low calcium  level, it appears that the patient has not been adherent with vitamin/medication intake   Case management will be consulted as the patient is 89 years old and appears that no one is helping with her medication management    She is receiving IV calcium , calcitriol  has been restarted  I have recommended starting Caltrate 600 milligram twice daily   Hospitalist team has increased levothyroxine  to 100 mcg daily Will follow

## 2023-08-15 NOTE — TOC Initial Note (Signed)
 Transition of Care [facility]) - Initial/Assessment Note    Patient Details  Name: [name] [name] MRN: [id number] Date of Birth: December 30, 1927  [facility] CM/SW Contact:    Gertha Ku, LCSW Phone Number: 08/15/2023, 1:06 PM  Clinical Narrative:                 CSW met with pt and pt's daughter Abe Abed at bedside to discuss recommendations for SNF placement. Pt's daughter has agreed and would like [facility]. CSW explained the process noting pt will need insurance authorization. CSW to work pt up for SNF placement. TOC to follow.   Expected Discharge Plan: Skilled Nursing Facility Barriers to Discharge: Continued Medical Work up   Patient Goals and CMS Choice            Expected Discharge Plan and Services       Living arrangements for the past 2 months: Single Family Home                                      Prior Living Arrangements/Services Living arrangements for the past 2 months: Single Family Home Lives with:: Self, Adult Children Patient language and need for interpreter reviewed:: Yes Do you feel safe going back to the place where you live?: Yes      Need for Family Participation in Patient Care: Yes (Comment) Care giver support system in place?: Yes (comment) Current home services: DME Criminal Activity/Legal Involvement Pertinent to Current Situation/Hospitalization: No - Comment as needed  Activities of Daily Living   ADL Screening (condition at time of admission) Independently performs ADLs?: No Does the patient have a NEW difficulty with bathing/dressing/toileting/self-feeding that is expected to last >3 days?: Yes (Initiates electronic notice to provider for possible OT consult) Does the patient have a NEW difficulty with getting in/out of bed, walking, or climbing stairs that is expected to last >3 days?: Yes (Initiates electronic notice to provider for possible PT consult) Does the patient have a NEW difficulty with communication  that is expected to last >3 days?: No Is the patient deaf or have difficulty hearing?: Yes Does the patient have difficulty seeing, even when wearing glasses/contacts?: Yes Does the patient have difficulty concentrating, remembering, or making decisions?: Yes  Permission Sought/Granted                  Emotional Assessment Appearance:: Appears stated age Attitude/Demeanor/Rapport: Unable to Assess Affect (typically observed): Unable to Assess     Psych Involvement: No (comment)  Admission diagnosis:  Hypocalcemia [E83.51] Patient Active Problem List   Diagnosis Date Noted   Hypocalcemia 08/13/2023   Insomnia 06/05/2023   Cognitive impairment 06/05/2023   Osteoarthritis 01/17/2022   Pulmonary nodule 11/14/2021   Pulmonary embolism (HCC) 10/17/2021   Essential hypertension 01/11/2021   Leg edema 12/01/2020   Plantar flexed metatarsal bone of left foot 11/24/2020   Plantar flexed metatarsal bone of right foot 11/24/2020   Chronic sinusitis 07/17/2019   Right leg weakness 10/15/2018   Corns and callus 09/25/2018   Shingles 04/05/2018   HLD (hyperlipidemia) 08/05/2017   CKD (chronic kidney disease), stage III (HCC) 08/05/2017   Neuropathy 11/19/2014   Malignant neoplasm of thyroid  gland (HCC) 03/28/2007   HYPOTHYROIDISM, POSTSURGICAL 03/28/2007   HYPOPARATHYROIDISM 03/28/2007   Allergic rhinitis 11/02/2006   Disorder of bone and cartilage 11/02/2006   PCP:  Rodney Clamp, MD Pharmacy:   [facility] [address] ([address]), [place] - [address] [place] ([place]) [place] [address] Phone: [phone] Fax: [phone]     Social Drivers of Health (SDOH) Social History: SDOH Screenings   Food Insecurity: No Food Insecurity (08/13/2023)  Housing: Low Risk  (08/13/2023)  Transportation Needs: No Transportation Needs (08/13/2023)  Utilities: Not At Risk (08/13/2023)  Depression (PHQ2-9): Low Risk  (06/05/2023)  Social Connections: Moderately Isolated  (08/13/2023)  Tobacco Use: Low Risk  (08/13/2023)   SDOH Interventions:     Readmission Risk Interventions    10/19/2021    4:01 PM  Readmission Risk Prevention Plan  Transportation Screening Complete  PCP or Specialist Appt within 3-5 Days Complete  HRI or Home Care Consult Complete  Social Work Consult for Recovery Care Planning/Counseling Complete  Palliative Care Screening Not Complete  Medication Review Oceanographer) Complete

## 2023-08-15 NOTE — Progress Notes (Signed)
   08/15/23 1237  Assess: MEWS Score  Temp (!) 97.4 F (36.3 C)  BP (!) 139/90  MAP (mmHg) 103  Pulse Rate 66  Resp (!) 22  Level of Consciousness Responds to Pain  SpO2 93 %  O2 Device Room Air  Assess: MEWS Score  MEWS Temp 0  MEWS Systolic 0  MEWS Pulse 0  MEWS RR 1  MEWS LOC 2  MEWS Score 3  MEWS Score Color Yellow  Assess: if the MEWS score is Yellow or Red  Were vital signs accurate and taken at a resting state? Yes  Does the patient meet 2 or more of the SIRS criteria? No  MEWS guidelines implemented  Yes, yellow  Treat  MEWS Interventions Considered administering scheduled or prn medications/treatments as ordered  Take Vital Signs  Increase Vital Sign Frequency  Yellow: Q2hr x1, continue Q4hrs until patient remains green for 12hrs  Escalate  MEWS: Escalate Yellow: Discuss with charge nurse and consider notifying provider and/or RRT  Notify: Charge Nurse/RN  Name of Charge Nurse/RN Notified Bea Lime, RN  Assess: SIRS CRITERIA  SIRS Temperature  0  SIRS Respirations  1  SIRS Pulse 0  SIRS WBC 0  SIRS Score Sum  1

## 2023-08-16 DIAGNOSIS — E785 Hyperlipidemia, unspecified: Secondary | ICD-10-CM | POA: Diagnosis not present

## 2023-08-16 DIAGNOSIS — E890 Postprocedural hypothyroidism: Secondary | ICD-10-CM | POA: Diagnosis not present

## 2023-08-16 DIAGNOSIS — N1832 Chronic kidney disease, stage 3b: Secondary | ICD-10-CM | POA: Diagnosis not present

## 2023-08-16 LAB — BASIC METABOLIC PANEL WITH GFR
Anion gap: 12 (ref 5–15)
BUN: 39 mg/dL — ABNORMAL HIGH (ref 8–23)
CO2: 22 mmol/L (ref 22–32)
Calcium: 6.3 mg/dL — CL (ref 8.9–10.3)
Chloride: 103 mmol/L (ref 98–111)
Creatinine, Ser: 1.48 mg/dL — ABNORMAL HIGH (ref 0.44–1.00)
GFR, Estimated: 33 mL/min — ABNORMAL LOW (ref >60)
Glucose, Bld: 76 mg/dL (ref 70–99)
Potassium: 3.6 mmol/L (ref 3.5–5.1)
Sodium: 137 mmol/L (ref 135–145)

## 2023-08-16 LAB — CALCIUM, IONIZED: Calcium, Ionized, Serum: 3.3 mg/dL — ABNORMAL LOW (ref 4.5–5.6)

## 2023-08-16 NOTE — Plan of Care (Signed)
  Problem: Education: Goal: Knowledge of General Education information will improve Description: Including pain rating scale, medication(s)/side effects and non-pharmacologic comfort measures Outcome: Progressing   Problem: Clinical Measurements: Goal: Diagnostic test results will improve Outcome: Progressing   Problem: Nutrition: Goal: Adequate nutrition will be maintained Outcome: Progressing   Problem: Safety: Goal: Ability to remain free from injury will improve Outcome: Progressing

## 2023-08-16 NOTE — TOC Progression Note (Signed)
 Transition of Care [facility]) - Progression Note    Patient Details  Name: [name] [name] MRN: [id number] Date of Birth: 1927-08-24  [facility] Care [facility]) CM/SW Contact  Gertha Ku, LCSW Phone Number: 08/16/2023, 1:27 PM  Clinical Narrative:    Pt was accepted to [facility] for SNF placement. Will wait for medically stability to start insurance authorization. TOC to follow.     Expected Discharge Plan: Skilled Nursing Facility Barriers to Discharge: Continued Medical Work up  Expected Discharge Plan and Services       Living arrangements for the past 2 months: Single Family Home                                       Social Determinants of Health (SDOH) Interventions SDOH Screenings   Food Insecurity: No Food Insecurity (08/13/2023)  Housing: Low Risk  (08/13/2023)  Transportation Needs: No Transportation Needs (08/13/2023)  Utilities: Not At Risk (08/13/2023)  Depression (PHQ2-9): Low Risk  (06/05/2023)  Social Connections: Moderately Isolated (08/13/2023)  Tobacco Use: Low Risk  (08/13/2023)    Readmission Risk Interventions    10/19/2021    4:01 PM  Readmission Risk Prevention Plan  Transportation Screening Complete  PCP or Specialist Appt within 3-5 Days Complete  HRI or Home Care Consult Complete  Social Work Consult for Recovery Care Planning/Counseling Complete  Palliative Care Screening Not Complete  Medication Review Oceanographer) Complete

## 2023-08-16 NOTE — Progress Notes (Signed)
 Physical Therapy Treatment Patient Details Name: [name] [name] MRN: [id number] DOB: Mar 26, 1928 Today's Date: 08/16/2023   History of Present Illness 89 y.o female with severe hypocalcemia. PMH: postsurgical hypothyroidism, thyroid  cancer, recurrent UTIs, chronic kidney disease stage III, hyperlipidemia, ORIF L radial fx, osteopenia    PT Comments  Pt requiring increassd assist for bed mobility.  Pt sat EOB for a few minutes while RN had pt take medication.  Pt requiring cues to attempt to maintain upright posture without external support however only able to briefly hold before requiring assist for posterior trunk lean. Pt fatigued quickly and requested back to bed. Current d/c plan for SNF remains appropriate.    If plan is discharge home, recommend the following: A lot of help with walking and/or transfers;A lot of help with bathing/dressing/bathroom;Assistance with cooking/housework;Assist for transportation;Help with stairs or ramp for entrance   Can travel by private vehicle        Equipment Recommendations  None recommended by PT    Recommendations for Other Services       Precautions / Restrictions Precautions Precautions: Fall Precaution/Restrictions Comments: HOH     Mobility  Bed Mobility Overal bed mobility: Needs Assistance Bed Mobility: Supine to Sit, Sit to Supine     Supine to sit: Mod assist Sit to supine: Mod assist   General bed mobility comments: multimodal cues for technique, assist mostly for upper body and scooting to EOB    Transfers                        Ambulation/Gait                   Stairs             Wheelchair Mobility     Tilt Bed    Modified Rankin (Stroke Patients Only)       Balance Overall balance assessment: Needs assistance Sitting-balance support: Bilateral upper extremity supported, Feet supported Sitting balance-Leahy Scale: Poor Sitting balance - Comments: occasionally able to sit  unsupported with external assist however heavily reliant on UE support, encouraged sitting upright without physical external support as tolerated however pt fatigues quickly Postural control: Posterior lean                                  Communication Communication Communication: Impaired Factors Affecting Communication: Hearing impaired  Cognition Arousal: Alert Behavior During Therapy: WFL for tasks assessed/performed   PT - Cognitive impairments: Difficult to assess Difficult to assess due to: Hard of hearing/deaf                       Following commands: Impaired Following commands impaired: Follows one step commands with increased time    Cueing Cueing Techniques: Verbal cues, Gestural cues, Tactile cues, Visual cues  Exercises      General Comments        Pertinent Vitals/Pain Pain Assessment Pain Assessment: Faces Faces Pain Scale: No hurt Pain Intervention(s): Repositioned, Monitored during session    Home Living                          Prior Function            PT Goals (current goals can now be found in the care plan section) Progress towards PT goals: Progressing toward goals    Frequency  Min 2X/week      PT Plan      Co-evaluation              AM-PAC PT "6 Clicks" Mobility   Outcome Measure  Help needed turning from your back to your side while in a flat bed without using bedrails?: A Lot Help needed moving from lying on your back to sitting on the side of a flat bed without using bedrails?: A Lot Help needed moving to and from a bed to a chair (including a wheelchair)?: A Lot Help needed standing up from a chair using your arms (e.g., wheelchair or bedside chair)?: A Lot Help needed to walk in hospital room?: Total Help needed climbing 3-5 steps with a railing? : Total 6 Click Score: 10    End of Session   Activity Tolerance: Patient tolerated treatment well;Patient limited by fatigue Patient  left: in bed;with call bell/phone within reach;with nursing/sitter in room Nurse Communication: Mobility status PT Visit Diagnosis: Muscle weakness (generalized) (M62.81);Difficulty in walking, not elsewhere classified (R26.2)     Time: 4098-1191 PT Time Calculation (min) (ACUTE ONLY): 12 min  Charges:    $Therapeutic Activity: 8-22 mins PT General Charges $$ ACUTE PT VISIT: 1 Visit                    [name] [name], DPT Physical Therapist Acute Rehabilitation Services Office: [phone]  [name] [name] 08/16/2023, 3:46 PM

## 2023-08-16 NOTE — Progress Notes (Signed)
 Progress Note   Patient: [name] [name] BJY:[id number] DOB: 1927-08-18 DOA: 08/13/2023     3 DOS: the patient was seen and examined on 08/16/2023   Brief hospital course: [name] [name] is a 89 y.o. female with medical history significant of postsurgical hypothyroidism, history of thyroid  cancer, recurrent UTIs, chronic kidney disease stage III, hyperlipidemia, who lives with family at home found to have markedly low calcium , elevated TSH.  She is admitted for further management and evaluation.   Assessment and Plan: Severe hypocalcemia:  Calcium  6.3 today, ionized calcium  ordered. Poor compliance with medications per family. She has postsurgical hypoparathyroidism. She got IV calcium  gluconate x3. Continue oral calcium  carbonate on a regular basis.  PTH level 22. Continue calcitriol  treatment. Hold Lasix  as it can lower calcium . Discussed with Dr. Rosalea Collin, endocrinologist - continue calcium  supplementation, calcitriol , levothyroxine . Outpatient follow up.   Hypothyroidism: Postsurgical.  TSH elevated at 15. Free T4 1.45, T3<20. Levothyroxine  dose changed from 88 to 100mcg.   Acute metabolic encephalopathy- In the setting of electrolyte abnormalities. Her mental status improved. On and off confused per RN. Avoid sedatives, opiates. Stopped gabapentin . Continue neuro checks. Delirium precautions. PT/ OT recommended STR.   History of recurrent UTIs:  Urinalysis unremarkable.   Elevated LFTs: trending down. Check RUQ sono.   Chronic kidney disease stage IV:  Avoid nephrotoxic drugs. Continue to monitor daily renal function.   Severe hearing loss:  Poor historian due to hearing loss. Patient will need some hearing aid to communicate better. Family states they sometimes use writing to communicate.   Essential hypertension:  BP stable. IV hydralazine  PRN.    Out of bed to chair. Incentive spirometry. Nursing supportive care. Fall, aspiration precautions. Diet:  Diet  Orders (From admission, onward)     Start     Ordered   08/13/23 2057  Diet Heart Room service appropriate? Yes; Fluid consistency: Thin  Diet effective now       Question Answer Comment  Room service appropriate? Yes   Fluid consistency: Thin      08/13/23 2056           DVT prophylaxis: enoxaparin (LOVENOX) injection 30 mg Start: 08/14/23 0800  Level of care: Telemetry   Code Status: Limited: Do not attempt resuscitation (DNR) -DNR-LIMITED -Do Not Intubate/DNI   Subjective: Patient is seen and examined today morning. She is alert, awake. States she is eating fair. Denies any complaints. Son at bedside.   Physical Exam: Vitals:   08/15/23 1441 08/15/23 1907 08/15/23 2022 08/16/23 0252  BP: (!) 147/90 (!) 141/87 126/74 122/83  Pulse: 70 72 65 (!) 57  Resp:  16 14 14   Temp: (!) 97.5 F (36.4 C)  97.6 F (36.4 C) 97.7 F (36.5 C)  TempSrc:   Oral Oral  SpO2: 95% 98% 98% 95%  Weight:      Height:        General - Elderly Caucasian ill female, no apparent distress. HEENT - PERRLA, EOMI, atraumatic head, hard of hearing. Lung - Clear, basal rales, no rhonchi, wheezes. Heart - S1, S2 heard, no murmurs, rubs, trace pedal edema. Abdomen - Soft, non tender, bowel sounds good Neuro - Alert, awake, non focal neuro exam. Skin - Warm and dry.  Data Reviewed:      Latest Ref Rng & Units 08/14/2023    4:39 AM 08/13/2023    5:21 PM 07/20/2023    1:58 PM  CBC  WBC 4.0 - 10.5 K/uL 5.6  5.8  8.3  Hemoglobin 12.0 - 15.0 g/dL 84.1  32.4  40.1   Hematocrit 36.0 - 46.0 % 37.7  43.1  34.6   Platelets 150 - 400 K/uL 283  315  304.0       Latest Ref Rng & Units 08/16/2023    9:39 AM 08/14/2023    4:39 AM 08/13/2023    5:21 PM  BMP  Glucose 70 - 99 mg/dL 76  98  027   BUN 8 - 23 mg/dL 39  47  47   Creatinine 0.44 - 1.00 mg/dL 2.53  6.64  4.03   Sodium 135 - 145 mmol/L 137  136  137   Potassium 3.5 - 5.1 mmol/L 3.6  4.2  5.2   Chloride 98 - 111 mmol/L 103  101  100   CO2 22 - 32  mmol/L 22  20  21    Calcium  8.9 - 10.3 mg/dL 6.3  5.7  5.7    US  Abdomen Limited RUQ (LIVER/GB) Result Date: 08/15/2023 CLINICAL DATA:  Elevated liver function tests. EXAM: ULTRASOUND ABDOMEN LIMITED RIGHT UPPER QUADRANT COMPARISON:  None Available. FINDINGS: Gallbladder: Cholelithiasis and sludge is noted, with largest gallstone measuring 2.6 cm. No significant gallbladder wall thickening or sonographic Murphy's sign is noted. Sludge is noted. Possible mild pericholecystic fluid. Common bile duct: Diameter: 3 mm which is within normal limits. Liver: No focal lesion identified. Within normal limits in parenchymal echogenicity. Portal vein is patent on color Doppler imaging with normal direction of blood flow towards the liver. Other: None. IMPRESSION: Cholelithiasis and sludge is noted without definite gallbladder wall thickening or sonographic Murphy's sign. Possible mild pericholecystic fluid. If there is clinical concern for cholecystitis, HIDA scan is recommended for further evaluation. Electronically Signed   By: Rosalene Colon M.D.   On: 08/15/2023 19:25    Family discussion- Discussed with patient's son at bedside. He understand and agree.  Disposition: Status is: Inpatient Remains inpatient appropriate because: hypocalcemia, need close neurologic monitoring  Planned Discharge Destination: Skilled nursing facility/ STR     Time spent: 38 minutes  Author: Aisha Hove, MD 08/16/2023 11:22 AM Secure chat 7am to 7pm For on call review [email or web address].

## 2023-08-17 DIAGNOSIS — E2089 Other specified hypoparathyroidism: Secondary | ICD-10-CM | POA: Diagnosis not present

## 2023-08-17 DIAGNOSIS — N1832 Chronic kidney disease, stage 3b: Secondary | ICD-10-CM | POA: Diagnosis not present

## 2023-08-17 DIAGNOSIS — E890 Postprocedural hypothyroidism: Secondary | ICD-10-CM | POA: Diagnosis not present

## 2023-08-17 LAB — BASIC METABOLIC PANEL WITH GFR
Anion gap: 10 (ref 5–15)
BUN: 42 mg/dL — ABNORMAL HIGH (ref 8–23)
CO2: 22 mmol/L (ref 22–32)
Calcium: 6.2 mg/dL — CL (ref 8.9–10.3)
Chloride: 103 mmol/L (ref 98–111)
Creatinine, Ser: 1.65 mg/dL — ABNORMAL HIGH (ref 0.44–1.00)
GFR, Estimated: 29 mL/min — ABNORMAL LOW (ref >60)
Glucose, Bld: 120 mg/dL — ABNORMAL HIGH (ref 70–99)
Potassium: 3.8 mmol/L (ref 3.5–5.1)
Sodium: 135 mmol/L (ref 135–145)

## 2023-08-17 MED ORDER — CALCIUM GLUCONATE-NACL 1-0.675 GM/50ML-% IV SOLN
1.0000 g | Freq: Once | INTRAVENOUS | Status: AC
  Administered 2023-08-17: 1000 mg via INTRAVENOUS
  Filled 2023-08-17: qty 50

## 2023-08-17 MED ORDER — CALCIUM CITRATE 950 (200 CA) MG PO TABS
200.0000 mg | ORAL_TABLET | Freq: Three times a day (TID) | ORAL | Status: DC
  Administered 2023-08-17 – 2023-08-20 (×9): 950 mg via ORAL
  Filled 2023-08-17 (×10): qty 1

## 2023-08-17 NOTE — Progress Notes (Signed)
 [facility]  PROGRESS NOTE  [name] [name] ZOX:[id number] DOB: [birth year] DOA: 08/13/2023 PCP: Rodney Clamp, MD   Brief HPI:   89 y.o. female with medical history significant of postsurgical hypothyroidism, history of thyroid  cancer, recurrent UTIs, chronic kidney disease stage III, hyperlipidemia, who lives with family at home found to have markedly low calcium , elevated TSH.  She is admitted for further management and evaluation.     Assessment/Plan:    Severe hypocalcemia:  Calcium  6.2 today, ionized calcium  ordered. Poor compliance with medications per family. She has postsurgical hypoparathyroidism. She got IV calcium  gluconate x4 Continue oral calcium  carbonate on a regular basis.  PTH level 22. Continue calcitriol  treatment. Hold Lasix  as it can lower calcium . Discussed with Dr. Rosalea Collin, endocrinologist - continue calcium  supplementation, calcitriol , levothyroxine . Outpatient follow up.   Hypothyroidism: Postsurgical.  TSH elevated at 15. Free T4 1.45, T3<20. Levothyroxine  dose changed from 88 to 100mcg.    Acute metabolic encephalopathy- In the setting of electrolyte abnormalities. Her mental status improved. On and off confused per RN. Avoid sedatives, opiates. Stopped gabapentin . Continue neuro checks. Delirium precautions. PT/ OT recommended STR.   History of recurrent UTIs:  Urinalysis unremarkable.   Elevated LFTs: trending down. Right upper quadrant ultrasound showed cholelithiasis and sludge without definitive gallbladder wall thickening or sonographic Murphy sign -   Chronic kidney disease stage IV:  Avoid nephrotoxic drugs. Continue to monitor daily renal function.   Severe hearing loss:  Poor historian due to hearing loss. Family states they sometimes use writing to communicate.   Essential hypertension:  BP stable. IV hydralazine  PRN.     Medications     calcitRIOL   0.5 mcg Oral Daily   calcium  citrate  200 mg of elemental calcium   Oral Daily   cholecalciferol   1,000 Units Oral Daily   docusate sodium   100 mg Oral QHS   enoxaparin  (LOVENOX ) injection  30 mg Subcutaneous Q24H   levothyroxine   100 mcg Oral Q0600   triamcinolone  ointment  1 Application Topical BID     Data Reviewed:   CBG:  No results for input(s): "GLUCAP" in the last 168 hours.  SpO2: 96 %    Vitals:   08/16/23 0252 08/16/23 1215 08/16/23 2102 08/17/23 0647  BP: 122/83 137/86 108/65 121/80  Pulse: (!) 57 74 75 69  Resp: 14 16 16 16   Temp: 97.7 F (36.5 C) (!) 97.4 F (36.3 C) 98.6 F (37 C) 97.6 F (36.4 C)  TempSrc: Oral Oral Oral   SpO2: 95% 100% 98% 96%  Weight:      Height:          Data Reviewed:  Basic Metabolic Panel: Recent Labs  Lab 08/13/23 1057 08/13/23 1721 08/14/23 0439 08/16/23 0939 08/17/23 0359  NA 137 137 136 137 135  K 4.0 5.2* 4.2 3.6 3.8  CL 99 100 101 103 103  CO2 24 21* 20* 22 22  GLUCOSE 103* 125* 98 76 120*  BUN 46* 47* 47* 39* 42*  CREATININE 2.04* 1.85* 1.78* 1.48* 1.65*  CALCIUM  5.4 Repeated and verified X2.* 5.7* 5.7* 6.3* 6.2*  MG  --  2.2  --   --   --   PHOS  --  6.3*  --   --   --     CBC: Recent Labs  Lab 08/13/23 1721 08/14/23 0439  WBC 5.8 5.6  NEUTROABS 4.5  --   HGB 13.3 12.1  HCT 43.1 37.7  MCV 94.7 92.6  PLT 315 283  LFT Recent Labs  Lab 08/13/23 1057 08/13/23 1721 08/14/23 0439  AST 73* 107* 95*  ALT 77* 97* 95*  ALKPHOS 67 78 75  BILITOT 1.1 1.7* 1.9*  PROT 6.6 7.8 6.6  ALBUMIN 3.7 3.9 3.5     Antibiotics: Anti-infectives (From admission, onward)    None        DVT prophylaxis: Lovenox   Code Status: DNR  Family Communication: Discussed with patient's daughter and son-in-law at bedside   CONSULTS    Subjective   Denies any complaints.   Objective    Physical Examination:   General-appears in no acute distress Heart-S1-S2, regular, no murmur auscultated Lungs-clear to auscultation bilaterally, no wheezing or crackles  auscultated Neuro-alert, oriented x3, no focal deficit noted   Status is: Inpatient:      Pressure Injury 08/13/23 Buttocks Left Stage 2 -  Partial thickness loss of dermis presenting as a shallow open injury with a red, pink wound bed without slough. (Active)  08/13/23 2030  Location: Buttocks  Location Orientation: Left  Staging: Stage 2 -  Partial thickness loss of dermis presenting as a shallow open injury with a red, pink wound bed without slough.  Wound Description (Comments):   Present on Admission: Yes        Adilene Areola S Marcellino Fidalgo   [facility] If 7PM-7AM, please contact night-coverage at [email or web address], Office  [phone]   08/17/2023, 8:38 AM  LOS: 4 days

## 2023-08-17 NOTE — Progress Notes (Signed)
 Occupational Therapy Treatment Patient Details Name: [name] [name] MRN: [id number] DOB: [birth year] Today's Date: 08/17/2023   History of present illness 89 y.o female with severe hypocalcemia. PMH: postsurgical hypothyroidism, thyroid  cancer, recurrent UTIs, chronic kidney disease stage III, hyperlipidemia, ORIF L radial fx, osteopenia   OT comments  Patient with fair progress toward patient focused goals.  Patient with improved mobility and EOB sitting tolerance and balance.  Eventually able to stand and take pivotal steps to recliner with Min A and balance support.  OT to continue efforts in the acute setting to address deficits, and Patient will benefit from continued inpatient follow up therapy, <3 hours/day.      If plan is discharge home, recommend the following:  A lot of help with walking and/or transfers;A lot of help with bathing/dressing/bathroom;Assistance with cooking/housework;Assistance with feeding;Direct supervision/assist for medications management;Direct supervision/assist for financial management;Assist for transportation;Help with stairs or ramp for entrance;Supervision due to cognitive status   Equipment Recommendations  None recommended by OT    Recommendations for Other Services      Precautions / Restrictions Precautions Precautions: Fall Precaution/Restrictions Comments: HOH Restrictions Weight Bearing Restrictions Per Provider Order: No       Mobility Bed Mobility Overal bed mobility: Needs Assistance Bed Mobility: Supine to Sit     Supine to sit: Mod assist          Transfers Overall transfer level: Needs assistance Equipment used: Rolling walker (2 wheels) Transfers: Sit to/from Stand, Bed to chair/wheelchair/BSC Sit to Stand: Min assist, Mod assist Stand pivot transfers: Min assist, Mod assist               Balance Overall balance assessment: Needs assistance Sitting-balance support: Feet supported Sitting balance-Leahy Scale:  Fair     Standing balance support: Reliant on assistive device for balance Standing balance-Leahy Scale: Poor                             ADL either performed or assessed with clinical judgement   ADL       Grooming: Wash/dry hands;Wash/dry face;Sitting;Cueing for sequencing;Contact guard assist           Upper Body Dressing : Minimal assistance;Sitting;Cueing for sequencing   Lower Body Dressing: Maximal assistance;Sit to/from stand   Toilet Transfer: Minimal assistance;Moderate assistance;Stand-pivot;BSC/3in1   Toileting- Clothing Manipulation and Hygiene: Maximal assistance;Sit to/from stand              Extremity/Trunk Assessment Upper Extremity Assessment Upper Extremity Assessment: Generalized weakness;RUE deficits/detail;LUE deficits/detail RUE Deficits / Details: Good AROM RUE Sensation: WNL RUE Coordination: WNL LUE Deficits / Details: Good AROM LUE Sensation: WNL LUE Coordination: WNL   Lower Extremity Assessment Lower Extremity Assessment: Defer to PT evaluation   Cervical / Trunk Assessment Cervical / Trunk Assessment: Kyphotic    Vision Patient Visual Report: No change from baseline     Perception Perception Perception: Not tested   Praxis Praxis Praxis: Not tested   Communication Communication Communication: Impaired Factors Affecting Communication: Hearing impaired   Cognition Arousal: Alert Behavior During Therapy: WFL for tasks assessed/performed Cognition: Difficult to assess             OT - Cognition Comments: severe HOH impacts sinificantly                 Following commands: Impaired Following commands impaired: Follows one step commands with increased time      Cueing   Cueing Techniques: Verbal  cues, Gestural cues, Tactile cues  Exercises      Shoulder Instructions       General Comments  VSS on RA    Pertinent Vitals/ Pain       Pain Assessment Pain Assessment: No/denies pain Pain  Intervention(s): Monitored during session                                                          Frequency  Min 2X/week        Progress Toward Goals  OT Goals(current goals can now be found in the care plan section)  Progress towards OT goals: Progressing toward goals  Acute Rehab OT Goals OT Goal Formulation: With patient Time For Goal Achievement: 08/28/23 Potential to Achieve Goals: Fair  Plan      Co-evaluation                 AM-PAC OT "6 Clicks" Daily Activity     Outcome Measure   Help from another person eating meals?: A Little Help from another person taking care of personal grooming?: A Little Help from another person toileting, which includes using toliet, bedpan, or urinal?: A Lot Help from another person bathing (including washing, rinsing, drying)?: A Lot Help from another person to put on and taking off regular upper body clothing?: A Little Help from another person to put on and taking off regular lower body clothing?: A Lot 6 Click Score: 15    End of Session Equipment Utilized During Treatment: Gait belt;Rolling walker (2 wheels)  OT Visit Diagnosis: Unsteadiness on feet (R26.81);History of falling (Z91.81);Muscle weakness (generalized) (M62.81);Low vision, both eyes (H54.2);Cognitive communication deficit (R41.841)   Activity Tolerance Patient tolerated treatment well   Patient Left in chair;with call bell/phone within reach;with chair alarm set;with family/visitor present   Nurse Communication Mobility status        Time: 1338-1400 OT Time Calculation (min): 22 min  Charges: OT General Charges $OT Visit: 1 Visit OT Treatments $Self Care/Home Management : 8-22 mins  08/17/2023  RP, OTR/L  Acute Rehabilitation Services  Office:  [phone]   Benjamen Brand 08/17/2023, 2:37 PM

## 2023-08-17 NOTE — Plan of Care (Signed)
  Problem: Education: Goal: Knowledge of General Education information will improve Description: Including pain rating scale, medication(s)/side effects and non-pharmacologic comfort measures Outcome: Progressing   Problem: Clinical Measurements: Goal: Diagnostic test results will improve Outcome: Progressing   Problem: Activity: Goal: Risk for activity intolerance will decrease Outcome: Progressing   Problem: Nutrition: Goal: Adequate nutrition will be maintained Outcome: Progressing   Problem: Safety: Goal: Ability to remain free from injury will improve Outcome: Progressing

## 2023-08-17 NOTE — Progress Notes (Signed)
 Chart review indicates patient is currently hospitalized . Follow up pending discharge.   [name] [name]  [facility]  [facility] Guide  Direct Dial: [phone]  Fax [phone]

## 2023-08-18 DIAGNOSIS — E2089 Other specified hypoparathyroidism: Secondary | ICD-10-CM | POA: Diagnosis not present

## 2023-08-18 DIAGNOSIS — N1832 Chronic kidney disease, stage 3b: Secondary | ICD-10-CM | POA: Diagnosis not present

## 2023-08-18 DIAGNOSIS — E890 Postprocedural hypothyroidism: Secondary | ICD-10-CM | POA: Diagnosis not present

## 2023-08-18 LAB — CBC
HCT: 38 % (ref 36.0–46.0)
Hemoglobin: 12.1 g/dL (ref 12.0–15.0)
MCH: 29.7 pg (ref 26.0–34.0)
MCHC: 31.8 g/dL (ref 30.0–36.0)
MCV: 93.1 fL (ref 80.0–100.0)
Platelets: 241 10*3/uL (ref 150–400)
RBC: 4.08 MIL/uL (ref 3.87–5.11)
RDW: 14.7 % (ref 11.5–15.5)
WBC: 5.6 10*3/uL (ref 4.0–10.5)
nRBC: 0 % (ref 0.0–0.2)

## 2023-08-18 LAB — COMPREHENSIVE METABOLIC PANEL WITH GFR
ALT: 77 U/L — ABNORMAL HIGH (ref 0–44)
AST: 63 U/L — ABNORMAL HIGH (ref 15–41)
Albumin: 2.8 g/dL — ABNORMAL LOW (ref 3.5–5.0)
Alkaline Phosphatase: 69 U/L (ref 38–126)
Anion gap: 10 (ref 5–15)
BUN: 29 mg/dL — ABNORMAL HIGH (ref 8–23)
CO2: 24 mmol/L (ref 22–32)
Calcium: 7.1 mg/dL — ABNORMAL LOW (ref 8.9–10.3)
Chloride: 105 mmol/L (ref 98–111)
Creatinine, Ser: 1.41 mg/dL — ABNORMAL HIGH (ref 0.44–1.00)
GFR, Estimated: 35 mL/min — ABNORMAL LOW (ref >60)
Glucose, Bld: 98 mg/dL (ref 70–99)
Potassium: 3.2 mmol/L — ABNORMAL LOW (ref 3.5–5.1)
Sodium: 139 mmol/L (ref 135–145)
Total Bilirubin: 1.1 mg/dL (ref 0.0–1.2)
Total Protein: 5.7 g/dL — ABNORMAL LOW (ref 6.5–8.1)

## 2023-08-18 MED ORDER — POTASSIUM CHLORIDE 20 MEQ PO PACK
40.0000 meq | PACK | ORAL | Status: AC
  Administered 2023-08-18 (×2): 40 meq via ORAL
  Filled 2023-08-18 (×2): qty 2

## 2023-08-18 NOTE — Progress Notes (Signed)
 [facility]  PROGRESS NOTE  [name] [name] ZOX:[id number] DOB: January 23, 1928 DOA: 08/13/2023 PCP: Rodney Clamp, MD   Brief HPI:   89 y.o. female with medical history significant of postsurgical hypothyroidism, history of thyroid  cancer, recurrent UTIs, chronic kidney disease stage III, hyperlipidemia, who lives with family at home found to have markedly low calcium , elevated TSH.  She is admitted for further management and evaluation.     Assessment/Plan:    Severe hypocalcemia:  Corrected calcium  8.1 today Poor compliance with medications per family. She has postsurgical hypoparathyroidism. She got IV calcium  gluconate x4 Continue oral calcium  carbonate on a regular basis.  PTH level 22. Continue calcitriol  treatment. Discussed with Dr. Rosalea Collin, endocrinologist - continue calcium  supplementation, calcitriol , levothyroxine . Outpatient follow up.   Hypothyroidism: Postsurgical.  TSH elevated at 15. Free T4 1.45, T3<20. Levothyroxine  dose changed from 88 to 100mcg.    Acute metabolic encephalopathy- In the setting of electrolyte abnormalities. Her mental status improved. On and off confused per RN. Avoid sedatives, opiates. Stopped gabapentin . Continue neuro checks. Delirium precautions. PT/ OT recommended STR.   History of recurrent UTIs:  Urinalysis unremarkable.   Elevated LFTs: trending down. Right upper quadrant ultrasound showed cholelithiasis and sludge without definitive gallbladder wall thickening or sonographic Murphy sign -   Chronic kidney disease stage IV:  Avoid nephrotoxic drugs. Continue to monitor daily renal function.   Severe hearing loss:  Poor historian due to hearing loss. Family states they sometimes use writing to communicate.   Essential hypertension:  BP stable. IV hydralazine  PRN.   Hypokalemia - Potassium is 3.2 - Will replace potassium and follow potassium level in a.m.  Medications     calcitRIOL   0.5 mcg Oral Daily   calcium   citrate  200 mg of elemental calcium  Oral TID   cholecalciferol   1,000 Units Oral Daily   docusate sodium   100 mg Oral QHS   enoxaparin  (LOVENOX ) injection  30 mg Subcutaneous Q24H   levothyroxine   100 mcg Oral Q0600   potassium chloride   40 mEq Oral Q4H   triamcinolone  ointment  1 Application Topical BID     Data Reviewed:   CBG:  No results for input(s): "GLUCAP" in the last 168 hours.  SpO2: 97 %    Vitals:   08/17/23 1238 08/17/23 2127 08/18/23 0300 08/18/23 0540  BP: 131/78 (!) 117/91  128/85  Pulse: 74 81  74  Resp: 18 18  16   Temp: 97.6 F (36.4 C) (!) 97.5 F (36.4 C)  98.4 F (36.9 C)  TempSrc: Oral Oral    SpO2: 98% 99%  97%  Weight:   51.1 kg   Height:          Data Reviewed:  Basic Metabolic Panel: Recent Labs  Lab 08/13/23 1721 08/14/23 0439 08/16/23 0939 08/17/23 0359 08/18/23 0453  NA 137 136 137 135 139  K 5.2* 4.2 3.6 3.8 3.2*  CL 100 101 103 103 105  CO2 21* 20* 22 22 24   GLUCOSE 125* 98 76 120* 98  BUN 47* 47* 39* 42* 29*  CREATININE 1.85* 1.78* 1.48* 1.65* 1.41*  CALCIUM  5.7* 5.7* 6.3* 6.2* 7.1*  MG 2.2  --   --   --   --   PHOS 6.3*  --   --   --   --     CBC: Recent Labs  Lab 08/13/23 1721 08/14/23 0439 08/18/23 0453  WBC 5.8 5.6 5.6  NEUTROABS 4.5  --   --   HGB  13.3 12.1 12.1  HCT 43.1 37.7 38.0  MCV 94.7 92.6 93.1  PLT 315 283 241    LFT Recent Labs  Lab 08/13/23 1057 08/13/23 1721 08/14/23 0439 08/18/23 0453  AST 73* 107* 95* 63*  ALT 77* 97* 95* 77*  ALKPHOS 67 78 75 69  BILITOT 1.1 1.7* 1.9* 1.1  PROT 6.6 7.8 6.6 5.7*  ALBUMIN 3.7 3.9 3.5 2.8*     Antibiotics: Anti-infectives (From admission, onward)    None        DVT prophylaxis: Lovenox   Code Status: DNR  Family Communication: Discussed with patient's daughter and son-in-law at bedside   CONSULTS    Subjective   Denies any complaints   Objective    Physical Examination:   Appears in no acute distress S1-S2,  regular Lungs clear to auscultation bilaterally  Status is: Inpatient:      Pressure Injury 08/13/23 Buttocks Left Stage 2 -  Partial thickness loss of dermis presenting as a shallow open injury with a red, pink wound bed without slough. (Active)  08/13/23 2030  Location: Buttocks  Location Orientation: Left  Staging: Stage 2 -  Partial thickness loss of dermis presenting as a shallow open injury with a red, pink wound bed without slough.  Wound Description (Comments):   Present on Admission: Yes        [name] [name] S Elida Harbin   [facility] If 7PM-7AM, please contact night-coverage at [email or web address], Office  [phone]   08/18/2023, 8:47 AM  LOS: 5 days

## 2023-08-18 NOTE — TOC Progression Note (Addendum)
 Transition of Care [facility]) - Progression Note    Patient Details  Name: METZLY MIKEL MRN: [id number] Date of Birth: [birth year]  Transition of Care [facility]) CM/SW Contact  Katrine Parody, LCSW Phone Number: 08/18/2023, 11:44 AM  Clinical Narrative:    CSW received [place] from RN, pt ready for DC, and Auth can be started. CSW contacted [employer] to start auth- no answer message left to return call for member authorization for SNF.  TOC to continue to follow.   AddendumTrevor Fudge at [employer] called back and completed Auth request. TOC to follow.   Expected Discharge Plan: Skilled Nursing Facility Barriers to Discharge: Insurance Authorization  Expected Discharge Plan and Services       Living arrangements for the past 2 months: Single Family Home                                       Social Determinants of Health (SDOH) Interventions SDOH Screenings   Food Insecurity: No Food Insecurity (08/13/2023)  Housing: Low Risk  (08/13/2023)  Transportation Needs: No Transportation Needs (08/13/2023)  Utilities: Not At Risk (08/13/2023)  Depression (PHQ2-9): Low Risk  (06/05/2023)  Social Connections: Moderately Isolated (08/13/2023)  Tobacco Use: Low Risk  (08/13/2023)    Readmission Risk Interventions    10/19/2021    4:01 PM  Readmission Risk Prevention Plan  Transportation Screening Complete  PCP or Specialist Appt within 3-5 Days Complete  HRI or Home Care Consult Complete  Social Work Consult for Recovery Care Planning/Counseling Complete  Palliative Care Screening Not Complete  Medication Review Oceanographer) Complete

## 2023-08-18 NOTE — Progress Notes (Signed)
 Mobility Specialist - Progress Note   08/18/23 0912  Mobility  Activity Transferred from bed to chair  Level of Assistance Maximum assist, patient does 25-49%  Assistive Device Front wheel walker  Distance Ambulated (ft) 1 ft  Range of Motion/Exercises Active Assistive  Activity Response Tolerated well  Mobility Referral Yes  Mobility visit 1 Mobility  Mobility Specialist Start Time (ACUTE ONLY) 0900  Mobility Specialist Stop Time (ACUTE ONLY) 0910  Mobility Specialist Time Calculation (min) (ACUTE ONLY) 10 min   Pt was found in bed and agreeable to transfer to recliner chair. Was able to sit EOB, although unsteady. Pt able to go STS and pivot with cues. At EOS was left on recliner chair with all needs met. Call bell in reach and chair alarm on. RN notified.  Lorna Rose Mobility Specialist

## 2023-08-19 DIAGNOSIS — N1832 Chronic kidney disease, stage 3b: Secondary | ICD-10-CM | POA: Diagnosis not present

## 2023-08-19 DIAGNOSIS — E890 Postprocedural hypothyroidism: Secondary | ICD-10-CM | POA: Diagnosis not present

## 2023-08-19 DIAGNOSIS — E2089 Other specified hypoparathyroidism: Secondary | ICD-10-CM | POA: Diagnosis not present

## 2023-08-19 LAB — COMPREHENSIVE METABOLIC PANEL WITH GFR
ALT: 68 U/L — ABNORMAL HIGH (ref 0–44)
AST: 56 U/L — ABNORMAL HIGH (ref 15–41)
Albumin: 2.9 g/dL — ABNORMAL LOW (ref 3.5–5.0)
Alkaline Phosphatase: 68 U/L (ref 38–126)
Anion gap: 8 (ref 5–15)
BUN: 24 mg/dL — ABNORMAL HIGH (ref 8–23)
CO2: 24 mmol/L (ref 22–32)
Calcium: 7.6 mg/dL — ABNORMAL LOW (ref 8.9–10.3)
Chloride: 104 mmol/L (ref 98–111)
Creatinine, Ser: 1.02 mg/dL — ABNORMAL HIGH (ref 0.44–1.00)
GFR, Estimated: 51 mL/min — ABNORMAL LOW (ref >60)
Glucose, Bld: 100 mg/dL — ABNORMAL HIGH (ref 70–99)
Potassium: 4.5 mmol/L (ref 3.5–5.1)
Sodium: 136 mmol/L (ref 135–145)
Total Bilirubin: 1.4 mg/dL — ABNORMAL HIGH (ref 0.0–1.2)
Total Protein: 6 g/dL — ABNORMAL LOW (ref 6.5–8.1)

## 2023-08-19 LAB — CALCIUM, IONIZED: Calcium, Ionized, Serum: 3.7 mg/dL — ABNORMAL LOW (ref 4.5–5.6)

## 2023-08-19 NOTE — Progress Notes (Signed)
 [facility]  PROGRESS NOTE  [name] [name] UYQ:[id number] DOB: 1927-10-25 DOA: 08/13/2023 PCP: [name] Clamp, MD   Brief HPI:   89 y.o. female with medical history significant of postsurgical hypothyroidism, history of thyroid  cancer, recurrent UTIs, chronic kidney disease stage III, hyperlipidemia, who lives with family at home found to have markedly low calcium , elevated TSH.  She is admitted for further management and evaluation.     Assessment/Plan:    Severe hypocalcemia:  Corrected calcium  8.3 today Poor compliance with medications per family. She has postsurgical hypoparathyroidism. She got IV calcium  gluconate x4 Continue oral calcium  carbonate on a regular basis.  PTH level 22. Continue calcitriol  treatment. Discussed with Dr. Rosalea [name], endocrinologist - continue calcium  supplementation, calcitriol , levothyroxine . Outpatient follow up.   Hypothyroidism: Postsurgical.  TSH elevated at 15. Free T4 1.45, T3<20. Levothyroxine  dose changed from 88 to 100mcg.    Acute metabolic encephalopathy- In the setting of electrolyte abnormalities. Her mental status improved. On and off confused per RN. Avoid sedatives, opiates. Stopped gabapentin . Continue neuro checks. Delirium precautions. PT/ OT recommended STR.   History of recurrent UTIs:  Urinalysis unremarkable.   Elevated LFTs: trending down. Right upper quadrant ultrasound showed cholelithiasis and sludge without definitive gallbladder wall thickening or sonographic Murphy sign -   Chronic kidney disease stage IV:  Avoid nephrotoxic drugs. Continue to monitor daily renal function.   Severe hearing loss:  Poor historian due to hearing loss. Family states they sometimes use writing to communicate.   Essential hypertension:  BP stable. IV hydralazine  PRN.   Hypokalemia - Replete  Medications     calcitRIOL   0.5 mcg Oral Daily   calcium  citrate  200 mg of elemental calcium  Oral TID   cholecalciferol    1,000 Units Oral Daily   docusate sodium   100 mg Oral QHS   enoxaparin  (LOVENOX ) injection  30 mg Subcutaneous Q24H   levothyroxine   100 mcg Oral Q0600   triamcinolone  ointment  1 Application Topical BID     Data Reviewed:   CBG:  No results for input(s): "GLUCAP" in the last 168 hours.  SpO2: 97 %    Vitals:   08/18/23 0540 08/18/23 1415 08/18/23 2032 08/19/23 0458  BP: 128/85 113/71 128/88 126/84  Pulse: 74 73 80 77  Resp: 16 18    Temp: 98.4 F (36.9 C) 97.6 F (36.4 C) 97.6 F (36.4 C) 97.8 F (36.6 C)  TempSrc:  Oral Oral Oral  SpO2: 97% 97% 99% 97%  Weight:      Height:          Data Reviewed:  Basic Metabolic Panel: Recent Labs  Lab 08/13/23 1721 08/14/23 0439 08/16/23 0939 08/17/23 0359 08/18/23 0453 08/19/23 0439  NA 137 136 137 135 139 136  K 5.2* 4.2 3.6 3.8 3.2* 4.5  CL 100 101 103 103 105 104  CO2 21* 20* 22 22 24 24   GLUCOSE 125* 98 76 120* 98 100*  BUN 47* 47* 39* 42* 29* 24*  CREATININE 1.85* 1.78* 1.48* 1.65* 1.41* 1.02*  CALCIUM  5.7* 5.7* 6.3* 6.2* 7.1* 7.6*  MG 2.2  --   --   --   --   --   PHOS 6.3*  --   --   --   --   --     CBC: Recent Labs  Lab 08/13/23 1721 08/14/23 0439 08/18/23 0453  WBC 5.8 5.6 5.6  NEUTROABS 4.5  --   --   HGB 13.3 12.1 12.1  HCT  43.1 37.7 38.0  MCV 94.7 92.6 93.1  PLT 315 283 241    LFT Recent Labs  Lab 08/13/23 1057 08/13/23 1721 08/14/23 0439 08/18/23 0453 08/19/23 0439  AST 73* 107* 95* 63* 56*  ALT 77* 97* 95* 77* 68*  ALKPHOS 67 78 75 69 68  BILITOT 1.1 1.7* 1.9* 1.1 1.4*  PROT 6.6 7.8 6.6 5.7* 6.0*  ALBUMIN 3.7 3.9 3.5 2.8* 2.9*     Antibiotics: Anti-infectives (From admission, onward)    None        DVT prophylaxis: Lovenox   Code Status: DNR  Family Communication: Discussed with patient's daughter and son-in-law at bedside   CONSULTS    Subjective   Denies any complaints   Objective    Physical Examination:   Appears in no acute distress S1-S2,  regular, no murmur auscultated Abdomen is soft, nontender, no organomegaly  Status is: Inpatient:      Pressure Injury 08/13/23 Buttocks Left Stage 2 -  Partial thickness loss of dermis presenting as a shallow open injury with a red, pink wound bed without slough. (Active)  08/13/23 2030  Location: Buttocks  Location Orientation: Left  Staging: Stage 2 -  Partial thickness loss of dermis presenting as a shallow open injury with a red, pink wound bed without slough.  Wound Description (Comments):   Present on Admission: Yes        [name] [name] S Magie Ciampa   [facility] If 7PM-7AM, please contact night-coverage at [email or web address], Office  [phone]   08/19/2023, 8:08 AM  LOS: 6 days

## 2023-08-20 ENCOUNTER — Other Ambulatory Visit

## 2023-08-20 DIAGNOSIS — E2089 Other specified hypoparathyroidism: Secondary | ICD-10-CM | POA: Diagnosis not present

## 2023-08-20 DIAGNOSIS — E890 Postprocedural hypothyroidism: Secondary | ICD-10-CM | POA: Diagnosis not present

## 2023-08-20 DIAGNOSIS — N1832 Chronic kidney disease, stage 3b: Secondary | ICD-10-CM | POA: Diagnosis not present

## 2023-08-20 LAB — COMPREHENSIVE METABOLIC PANEL WITH GFR
ALT: 58 U/L — ABNORMAL HIGH (ref 0–44)
AST: 47 U/L — ABNORMAL HIGH (ref 15–41)
Albumin: 2.7 g/dL — ABNORMAL LOW (ref 3.5–5.0)
Alkaline Phosphatase: 60 U/L (ref 38–126)
Anion gap: 8 (ref 5–15)
BUN: 28 mg/dL — ABNORMAL HIGH (ref 8–23)
CO2: 23 mmol/L (ref 22–32)
Calcium: 7.9 mg/dL — ABNORMAL LOW (ref 8.9–10.3)
Chloride: 105 mmol/L (ref 98–111)
Creatinine, Ser: 1.38 mg/dL — ABNORMAL HIGH (ref 0.44–1.00)
GFR, Estimated: 36 mL/min — ABNORMAL LOW (ref >60)
Glucose, Bld: 95 mg/dL (ref 70–99)
Potassium: 5 mmol/L (ref 3.5–5.1)
Sodium: 136 mmol/L (ref 135–145)
Total Bilirubin: 1.3 mg/dL — ABNORMAL HIGH (ref 0.0–1.2)
Total Protein: 5.7 g/dL — ABNORMAL LOW (ref 6.5–8.1)

## 2023-08-20 MED ORDER — ORAL CARE MOUTH RINSE: 15.0000 mL | OROMUCOSAL | Status: DC | PRN

## 2023-08-20 MED ORDER — CALCIUM CITRATE 950 (200 CA) MG PO TABS
200.0000 mg | ORAL_TABLET | Freq: Two times a day (BID) | ORAL | Status: DC
  Administered 2023-08-21: 950 mg via ORAL
  Filled 2023-08-20: qty 1

## 2023-08-20 NOTE — Progress Notes (Signed)
 Mobility Specialist - Progress Note   08/20/23 0808  Mobility  Activity Transferred from bed to chair  Level of Assistance Minimal assist, patient does 75% or more  Assistive Device Front wheel walker  Distance Ambulated (ft) 3 ft  Range of Motion/Exercises Active Assistive  Activity Response Tolerated well  Mobility Referral Yes  Mobility visit 1 Mobility  Mobility Specialist Start Time (ACUTE ONLY) 0755  Mobility Specialist Stop Time (ACUTE ONLY) 0808  Mobility Specialist Time Calculation (min) (ACUTE ONLY) 13 min   Pt was found in bed and agreeable to transfer to recliner chair. NT in room for safety. Steady sitting EOB. Was left on recliner chair with chair alarm on. Call bell in reach and family in room.  Lorna Rose Mobility Specialist

## 2023-08-20 NOTE — Plan of Care (Incomplete)
  Problem: Education: Goal: Knowledge of General Education information will improve Description: Including pain rating scale, medication(s)/side effects and non-pharmacologic comfort measures Outcome: Progressing   Problem: Health Behavior/Discharge Planning: Goal: Ability to manage health-related needs will improve Outcome: Progressing   Problem: Clinical Measurements: Goal: Ability to maintain clinical measurements within normal limits will improve Outcome: Progressing Goal: Will remain free from infection Outcome: Progressing Goal: Diagnostic test results will improve Outcome: Progressing   Problem: Activity: Goal: Risk for activity intolerance will decrease Outcome: Progressing   Problem: Nutrition: Goal: Adequate nutrition will be maintained Outcome: Progressing   Problem: Pain Managment: Goal: General experience of comfort will improve and/or be controlled Outcome: Progressing   Problem: Safety: Goal: Ability to remain free from injury will improve Outcome: Progressing   Problem: Skin Integrity: Goal: Risk for impaired skin integrity will decrease Outcome: Progressing   Problem: Clinical Measurements: Goal: Respiratory complications will improve Outcome: Adequate for Discharge Goal: Cardiovascular complication will be avoided Outcome: Adequate for Discharge   Problem: Coping: Goal: Level of anxiety will decrease Outcome: Adequate for Discharge   Problem: Elimination: Goal: Will not experience complications related to bowel motility Outcome: Adequate for Discharge Goal: Will not experience complications related to urinary retention Outcome: Adequate for Discharge

## 2023-08-20 NOTE — TOC Progression Note (Addendum)
 Transition of Care [facility]) - Progression Note    Patient Details  Name: [name] [name] MRN: [id number] Date of Birth: 29-Oct-1927  Transition of [facility]) CM/SW Contact  Gertha Ku, LCSW Phone Number: 08/20/2023, 10:44 AM  Clinical Narrative:     Pt's insurance [employer] is pending. TOC to follow.    Adden  3;30pm Pt's insurance [employer] was approved for SNF placement at [facility], Auth ID [id number] , pt was approved for EMS transport as well Auth ID: [id number]. CSW spoke with Sherrlyn Dolores at [facility] pt can admit tomorrow if stable.   CSW spoke with pt's daughter Abe Abed and provided update. TOC to follow.   Expected Discharge Plan: Skilled Nursing Facility Barriers to Discharge: Insurance Authorization  Expected Discharge Plan and Services       Living arrangements for the past 2 months: Single Family Home                                       Social Determinants of Health (SDOH) Interventions SDOH Screenings   Food Insecurity: No Food Insecurity (08/13/2023)  Housing: Low Risk  (08/13/2023)  Transportation Needs: No Transportation Needs (08/13/2023)  Utilities: Not At Risk (08/13/2023)  Depression (PHQ2-9): Low Risk  (06/05/2023)  Social Connections: Moderately Isolated (08/13/2023)  Tobacco Use: Low Risk  (08/13/2023)    Readmission Risk Interventions    10/19/2021    4:01 PM  Readmission Risk Prevention Plan  Transportation Screening Complete  PCP or Specialist Appt within 3-5 Days Complete  HRI or Home Care Consult Complete  Social Work Consult for Recovery Care Planning/Counseling Complete  Palliative Care Screening Not Complete  Medication Review [employer]) Complete

## 2023-08-20 NOTE — Progress Notes (Signed)
 [facility]  PROGRESS NOTE  [name] [name]:[id number] DOB: May 16, 1927 DOA: 08/13/2023 PCP: Rodney Clamp, MD   Brief HPI:   89 y.o. female with medical history significant of postsurgical hypothyroidism, history of thyroid  cancer, recurrent UTIs, chronic kidney disease stage III, hyperlipidemia, who lives with family at home found to have markedly low calcium , elevated TSH.  She is admitted for further management and evaluation.     Assessment/Plan:    Severe hypocalcemia:  Corrected calcium  8.7 today Poor compliance with medications per family. She has postsurgical hypoparathyroidism. She got IV calcium  gluconate x4 Continue oral calcium  carbonate on a regular basis.  PTH level 22. Continue calcitriol  treatment. Discussed with Dr. Rosalea Collin, endocrinologist - continue calcium  supplementation, calcitriol , levothyroxine . Outpatient follow up.   Hypothyroidism: Postsurgical.  TSH elevated at 15. Free T4 1.45, T3<20. Levothyroxine  dose changed from 88 to 100mcg.    Acute metabolic encephalopathy- In the setting of electrolyte abnormalities. Her mental status improved. On and off confused per RN. Avoid sedatives, opiates. Stopped gabapentin . Continue neuro checks. Delirium precautions. PT/ OT recommended STR.   History of recurrent UTIs:  Urinalysis unremarkable.   Elevated LFTs: trending down. Right upper quadrant ultrasound showed cholelithiasis and sludge without definitive gallbladder wall thickening or sonographic Murphy sign -   Chronic kidney disease stage IV:  Avoid nephrotoxic drugs. Continue to monitor daily renal function.   Severe hearing loss:  Poor historian due to hearing loss. Family states they sometimes use writing to communicate.   Essential hypertension:  BP stable. IV hydralazine  PRN.   Hypokalemia - Replete  Medications     calcitRIOL   0.5 mcg Oral Daily   calcium  citrate  200 mg of elemental calcium  Oral TID   cholecalciferol    1,000 Units Oral Daily   docusate sodium   100 mg Oral QHS   enoxaparin  (LOVENOX ) injection  30 mg Subcutaneous Q24H   levothyroxine   100 mcg Oral Q0600   triamcinolone  ointment  1 Application Topical BID     Data Reviewed:   CBG:  No results for input(s): "GLUCAP" in the last 168 hours.  SpO2: 97 %    Vitals:   08/19/23 2035 08/20/23 0509 08/20/23 1514 08/20/23 1604  BP: 126/88 130/88 114/81 (!) 137/90  Pulse: 82 76 74 79  Resp: 17 17 (!) 21 16  Temp: 98.3 F (36.8 C) 97.7 F (36.5 C) 97.6 F (36.4 C) 98.3 F (36.8 C)  TempSrc: Oral Oral Oral Oral  SpO2: 97% 96% 98% 97%  Weight:      Height:          Data Reviewed:  Basic Metabolic Panel: Recent Labs  Lab 08/13/23 1721 08/14/23 0439 08/16/23 0939 08/17/23 0359 08/18/23 0453 08/19/23 0439 08/20/23 0434  NA 137   < > 137 135 139 136 136  K 5.2*   < > 3.6 3.8 3.2* 4.5 5.0  CL 100   < > 103 103 105 104 105  CO2 21*   < > 22 22 24 24 23   GLUCOSE 125*   < > 76 120* 98 100* 95  BUN 47*   < > 39* 42* 29* 24* 28*  CREATININE 1.85*   < > 1.48* 1.65* 1.41* 1.02* 1.38*  CALCIUM  5.7*   < > 6.3* 6.2* 7.1* 7.6* 7.9*  MG 2.2  --   --   --   --   --   --   PHOS 6.3*  --   --   --   --   --   --    < > =  values in this interval not displayed.    CBC: Recent Labs  Lab 08/13/23 1721 08/14/23 0439 08/18/23 0453  WBC 5.8 5.6 5.6  NEUTROABS 4.5  --   --   HGB 13.3 12.1 12.1  HCT 43.1 37.7 38.0  MCV 94.7 92.6 93.1  PLT 315 283 241    LFT Recent Labs  Lab 08/13/23 1721 08/14/23 0439 08/18/23 0453 08/19/23 0439 08/20/23 0434  AST 107* 95* 63* 56* 47*  ALT 97* 95* 77* 68* 58*  ALKPHOS 78 75 69 68 60  BILITOT 1.7* 1.9* 1.1 1.4* 1.3*  PROT 7.8 6.6 5.7* 6.0* 5.7*  ALBUMIN 3.9 3.5 2.8* 2.9* 2.7*     Antibiotics: Anti-infectives (From admission, onward)    None        DVT prophylaxis: Lovenox   Code Status: DNR  Family Communication: Discussed with patient's daughter and son-in-law at  bedside   CONSULTS    Subjective   Denies any complaints   Objective    Physical Examination:  Appears in no acute distress S1-S2, regular, no murmur auscultated Abdomen is soft, nontender, no organomegaly Lungs clear to auscultation bilaterally   Status is: Inpatient:      Pressure Injury 08/13/23 Buttocks Left Stage 2 -  Partial thickness loss of dermis presenting as a shallow open injury with a red, pink wound bed without slough. (Active)  08/13/23 2030  Location: Buttocks  Location Orientation: Left  Staging: Stage 2 -  Partial thickness loss of dermis presenting as a shallow open injury with a red, pink wound bed without slough.  Wound Description (Comments):   Present on Admission: Yes        Ivyrose Hashman S Delvin Hedeen   [facility] If 7PM-7AM, please contact night-coverage at [email or web address], Office  [phone]   08/20/2023, 5:15 PM  LOS: 7 days

## 2023-08-21 DIAGNOSIS — K838 Other specified diseases of biliary tract: Secondary | ICD-10-CM | POA: Diagnosis not present

## 2023-08-21 DIAGNOSIS — N1831 Chronic kidney disease, stage 3a: Secondary | ICD-10-CM | POA: Diagnosis present

## 2023-08-21 DIAGNOSIS — I071 Rheumatic tricuspid insufficiency: Secondary | ICD-10-CM | POA: Diagnosis present

## 2023-08-21 DIAGNOSIS — Z515 Encounter for palliative care: Secondary | ICD-10-CM | POA: Diagnosis not present

## 2023-08-21 DIAGNOSIS — I739 Peripheral vascular disease, unspecified: Secondary | ICD-10-CM | POA: Diagnosis present

## 2023-08-21 DIAGNOSIS — I5084 End stage heart failure: Secondary | ICD-10-CM | POA: Diagnosis present

## 2023-08-21 DIAGNOSIS — R4181 Age-related cognitive decline: Secondary | ICD-10-CM | POA: Diagnosis not present

## 2023-08-21 DIAGNOSIS — E209 Hypoparathyroidism, unspecified: Secondary | ICD-10-CM | POA: Diagnosis not present

## 2023-08-21 DIAGNOSIS — Z7409 Other reduced mobility: Secondary | ICD-10-CM | POA: Diagnosis present

## 2023-08-21 DIAGNOSIS — Z79890 Hormone replacement therapy (postmenopausal): Secondary | ICD-10-CM | POA: Diagnosis not present

## 2023-08-21 DIAGNOSIS — C73 Malignant neoplasm of thyroid gland: Secondary | ICD-10-CM | POA: Diagnosis not present

## 2023-08-21 DIAGNOSIS — R0602 Shortness of breath: Secondary | ICD-10-CM | POA: Diagnosis not present

## 2023-08-21 DIAGNOSIS — I130 Hypertensive heart and chronic kidney disease with heart failure and stage 1 through stage 4 chronic kidney disease, or unspecified chronic kidney disease: Secondary | ICD-10-CM | POA: Diagnosis present

## 2023-08-21 DIAGNOSIS — K828 Other specified diseases of gallbladder: Secondary | ICD-10-CM | POA: Diagnosis not present

## 2023-08-21 DIAGNOSIS — Z87440 Personal history of urinary (tract) infections: Secondary | ICD-10-CM | POA: Diagnosis not present

## 2023-08-21 DIAGNOSIS — I5033 Acute on chronic diastolic (congestive) heart failure: Secondary | ICD-10-CM | POA: Diagnosis present

## 2023-08-21 DIAGNOSIS — I509 Heart failure, unspecified: Secondary | ICD-10-CM | POA: Diagnosis not present

## 2023-08-21 DIAGNOSIS — Z66 Do not resuscitate: Secondary | ICD-10-CM | POA: Diagnosis present

## 2023-08-21 DIAGNOSIS — Z85850 Personal history of malignant neoplasm of thyroid: Secondary | ICD-10-CM | POA: Diagnosis not present

## 2023-08-21 DIAGNOSIS — H9190 Unspecified hearing loss, unspecified ear: Secondary | ICD-10-CM | POA: Diagnosis not present

## 2023-08-21 DIAGNOSIS — E2089 Other specified hypoparathyroidism: Secondary | ICD-10-CM | POA: Diagnosis not present

## 2023-08-21 DIAGNOSIS — K5900 Constipation, unspecified: Secondary | ICD-10-CM | POA: Diagnosis not present

## 2023-08-21 DIAGNOSIS — Z7401 Bed confinement status: Secondary | ICD-10-CM | POA: Diagnosis not present

## 2023-08-21 DIAGNOSIS — J9601 Acute respiratory failure with hypoxia: Secondary | ICD-10-CM | POA: Diagnosis present

## 2023-08-21 DIAGNOSIS — N179 Acute kidney failure, unspecified: Secondary | ICD-10-CM | POA: Diagnosis present

## 2023-08-21 DIAGNOSIS — J811 Chronic pulmonary edema: Secondary | ICD-10-CM | POA: Diagnosis not present

## 2023-08-21 DIAGNOSIS — R918 Other nonspecific abnormal finding of lung field: Secondary | ICD-10-CM | POA: Diagnosis not present

## 2023-08-21 DIAGNOSIS — Z90710 Acquired absence of both cervix and uterus: Secondary | ICD-10-CM | POA: Diagnosis not present

## 2023-08-21 DIAGNOSIS — K5730 Diverticulosis of large intestine without perforation or abscess without bleeding: Secondary | ICD-10-CM | POA: Diagnosis not present

## 2023-08-21 DIAGNOSIS — I700 Atherosclerosis of aorta: Secondary | ICD-10-CM | POA: Diagnosis not present

## 2023-08-21 DIAGNOSIS — N183 Chronic kidney disease, stage 3 unspecified: Secondary | ICD-10-CM | POA: Diagnosis not present

## 2023-08-21 DIAGNOSIS — E039 Hypothyroidism, unspecified: Secondary | ICD-10-CM | POA: Diagnosis not present

## 2023-08-21 DIAGNOSIS — I878 Other specified disorders of veins: Secondary | ICD-10-CM | POA: Diagnosis present

## 2023-08-21 DIAGNOSIS — E892 Postprocedural hypoparathyroidism: Secondary | ICD-10-CM | POA: Diagnosis present

## 2023-08-21 DIAGNOSIS — R54 Age-related physical debility: Secondary | ICD-10-CM | POA: Diagnosis present

## 2023-08-21 DIAGNOSIS — N1832 Chronic kidney disease, stage 3b: Secondary | ICD-10-CM | POA: Diagnosis not present

## 2023-08-21 DIAGNOSIS — J309 Allergic rhinitis, unspecified: Secondary | ICD-10-CM | POA: Diagnosis not present

## 2023-08-21 DIAGNOSIS — K5792 Diverticulitis of intestine, part unspecified, without perforation or abscess without bleeding: Secondary | ICD-10-CM | POA: Diagnosis not present

## 2023-08-21 DIAGNOSIS — J90 Pleural effusion, not elsewhere classified: Secondary | ICD-10-CM | POA: Diagnosis not present

## 2023-08-21 DIAGNOSIS — R600 Localized edema: Secondary | ICD-10-CM | POA: Diagnosis not present

## 2023-08-21 DIAGNOSIS — E890 Postprocedural hypothyroidism: Secondary | ICD-10-CM | POA: Diagnosis present

## 2023-08-21 DIAGNOSIS — I10 Essential (primary) hypertension: Secondary | ICD-10-CM | POA: Diagnosis not present

## 2023-08-21 DIAGNOSIS — R4189 Other symptoms and signs involving cognitive functions and awareness: Secondary | ICD-10-CM | POA: Diagnosis not present

## 2023-08-21 DIAGNOSIS — L89322 Pressure ulcer of left buttock, stage 2: Secondary | ICD-10-CM | POA: Diagnosis present

## 2023-08-21 DIAGNOSIS — R4182 Altered mental status, unspecified: Secondary | ICD-10-CM | POA: Diagnosis not present

## 2023-08-21 DIAGNOSIS — Z833 Family history of diabetes mellitus: Secondary | ICD-10-CM | POA: Diagnosis not present

## 2023-08-21 DIAGNOSIS — G9341 Metabolic encephalopathy: Secondary | ICD-10-CM | POA: Diagnosis not present

## 2023-08-21 DIAGNOSIS — E785 Hyperlipidemia, unspecified: Secondary | ICD-10-CM | POA: Diagnosis present

## 2023-08-21 DIAGNOSIS — Z86711 Personal history of pulmonary embolism: Secondary | ICD-10-CM | POA: Diagnosis not present

## 2023-08-21 DIAGNOSIS — E8721 Acute metabolic acidosis: Secondary | ICD-10-CM | POA: Diagnosis present

## 2023-08-21 DIAGNOSIS — I080 Rheumatic disorders of both mitral and aortic valves: Secondary | ICD-10-CM | POA: Diagnosis present

## 2023-08-21 LAB — COMPREHENSIVE METABOLIC PANEL WITH GFR
ALT: 53 U/L — ABNORMAL HIGH (ref 0–44)
AST: 44 U/L — ABNORMAL HIGH (ref 15–41)
Albumin: 3 g/dL — ABNORMAL LOW (ref 3.5–5.0)
Alkaline Phosphatase: 63 U/L (ref 38–126)
Anion gap: 9 (ref 5–15)
BUN: 23 mg/dL (ref 8–23)
CO2: 22 mmol/L (ref 22–32)
Calcium: 8 mg/dL — ABNORMAL LOW (ref 8.9–10.3)
Chloride: 104 mmol/L (ref 98–111)
Creatinine, Ser: 1.09 mg/dL — ABNORMAL HIGH (ref 0.44–1.00)
GFR, Estimated: 47 mL/min — ABNORMAL LOW (ref >60)
Glucose, Bld: 83 mg/dL (ref 70–99)
Potassium: 4.3 mmol/L (ref 3.5–5.1)
Sodium: 135 mmol/L (ref 135–145)
Total Bilirubin: 1.1 mg/dL (ref 0.0–1.2)
Total Protein: 6.2 g/dL — ABNORMAL LOW (ref 6.5–8.1)

## 2023-08-21 MED ORDER — ACETAMINOPHEN 325 MG PO TABS: 650.0000 mg | ORAL_TABLET | Freq: Four times a day (QID) | ORAL | Status: DC | PRN

## 2023-08-21 MED ORDER — CALCIUM CITRATE 950 (200 CA) MG PO TABS: 200.0000 mg | ORAL_TABLET | Freq: Two times a day (BID) | ORAL | Status: DC

## 2023-08-21 MED ORDER — FUROSEMIDE 20 MG PO TABS: 20.0000 mg | ORAL_TABLET | Freq: Every day | ORAL | Status: DC | PRN

## 2023-08-21 MED ORDER — GABAPENTIN 100 MG PO CAPS: 100.0000 mg | ORAL_CAPSULE | ORAL | Status: DC | PRN

## 2023-08-21 MED ORDER — LEVOTHYROXINE SODIUM 100 MCG PO TABS: 100.0000 ug | ORAL_TABLET | Freq: Every day | ORAL | Status: DC

## 2023-08-21 NOTE — Progress Notes (Signed)
 Occupational Therapy Treatment Patient Details Name: [name] [name] MRN: [id number] DOB: [birth year] Today's Date: 08/21/2023   History of present illness 89 y.o female with severe hypocalcemia. PMH: postsurgical hypothyroidism, thyroid  cancer, recurrent UTIs, chronic kidney disease stage III, hyperlipidemia, ORIF L radial fx, osteopenia   OT comments  Patient seen for skilled OT session with daughter and sil bedside for education and treatment participation. Noted improved ability to perform STS and SPT with RW and light BADL's including improved functional reach from initial evaluation. See details below for status. Severe HOH impacts command following but with multimodal cues patient able to participate in all presented activity. Patient will benefit from continued inpatient follow up therapy, <3 hours/day. OT will continue to follow acutely to progress safety and performance and allow for discharge.        If plan is discharge home, recommend the following:  A lot of help with walking and/or transfers;A lot of help with bathing/dressing/bathroom;Assistance with cooking/housework;Assistance with feeding;Direct supervision/assist for medications management;Direct supervision/assist for financial management;Assist for transportation;Help with stairs or ramp for entrance;Supervision due to cognitive status   Equipment Recommendations  None recommended by OT       Precautions / Restrictions Precautions Precautions: Fall Precaution/Restrictions Comments: HOH Restrictions Weight Bearing Restrictions Per Provider Order: No       Mobility Bed Mobility Overal bed mobility: Needs Assistance Bed Mobility: Supine to Sit Rolling: Min assist   Supine to sit: Min assist     General bed mobility comments: multimodal cues to scoot to EOB    Transfers Overall transfer level: Needs assistance Equipment used: Rolling walker (2 wheels) Transfers: Sit to/from Stand, Bed to  chair/wheelchair/BSC Sit to Stand: Min assist           General transfer comment: cues for weight shifting and hand placement to power up to RW     Balance Overall balance assessment: Needs assistance Sitting-balance support: Feet supported Sitting balance-Leahy Scale: Fair     Standing balance support: Reliant on assistive device for balance, During functional activity, Bilateral upper extremity supported Standing balance-Leahy Scale: Poor                             ADL either performed or assessed with clinical judgement   ADL Overall ADL's : Needs assistance/impaired Eating/Feeding: Set up;Sitting;Cueing for sequencing   Grooming: Wash/dry hands;Wash/dry face;Oral care;Supervision/safety;Cueing for sequencing;Sitting Grooming Details (indicate cue type and reason): EOB and recliner level Upper Body Bathing: Minimal assistance;Cueing for sequencing;Sitting                           Functional mobility during ADLs: Moderate assistance;Rolling walker (2 wheels) General ADL Comments: mod- max cues for BADL's due to [facility], worked on forward reach to mid to lower legs this session    Extremity/Trunk Assessment Upper Extremity Assessment Upper Extremity Assessment: Generalized weakness   Lower Extremity Assessment Lower Extremity Assessment: Generalized weakness              Praxis Praxis Praxis: Impaired Praxis Impairment Details: Motor planning;Organization   Communication Communication Communication: Impaired Factors Affecting Communication: Hearing impaired   Cognition Arousal: Alert Behavior During Therapy: WFL for tasks assessed/performed     Orientation impairments: Place, Time, Situation Awareness: Intellectual awareness impaired, Online awareness impaired Memory impairment (select all impairments): Short-term memory Attention impairment (select first level of impairment): Focused attention Executive functioning impairment (select  all impairments): Initiation, Organization,  Sequencing, Reasoning, Problem solving OT - Cognition Comments: severe HOH impacts but able to follow with L ear verbal, gestures and large print simple written info                 Following commands: Impaired Following commands impaired: Follows one step commands with increased time      Cueing   Cueing Techniques: Verbal cues, Gestural cues, Tactile cues        General Comments no SOB on RA throughout session    Pertinent Vitals/ Pain       Pain Assessment Pain Assessment: Faces Faces Pain Scale: No hurt   Frequency  Min 2X/week        Progress Toward Goals  OT Goals(current goals can now be found in the care plan section)  Progress towards OT goals: Progressing toward goals  Acute Rehab OT Goals Patient Stated Goal: to get to rehab to keep getting stronger OT Goal Formulation: With family Time For Goal Achievement: 08/28/23 Potential to Achieve Goals: Fair ADL Goals Pt Will Perform Grooming: with set-up;sitting Pt Will Perform Upper Body Bathing: with set-up;sitting Pt Will Perform Upper Body Dressing: with supervision;sitting Pt Will Transfer to Toilet: with contact guard assist;bedside commode;grab bars  Plan         AM-PAC OT "6 Clicks" Daily Activity     Outcome Measure   Help from another person eating meals?: A Little Help from another person taking care of personal grooming?: A Little Help from another person toileting, which includes using toliet, bedpan, or urinal?: A Lot Help from another person bathing (including washing, rinsing, drying)?: A Lot Help from another person to put on and taking off regular upper body clothing?: A Little Help from another person to put on and taking off regular lower body clothing?: A Lot 6 Click Score: 15    End of Session Equipment Utilized During Treatment: Gait belt;Rolling walker (2 wheels)  OT Visit Diagnosis: Unsteadiness on feet (R26.81);History of falling  (Z91.81);Muscle weakness (generalized) (M62.81);Low vision, both eyes (H54.2);Cognitive communication deficit (R41.841)   Activity Tolerance Patient tolerated treatment well   Patient Left in chair;with call bell/phone within reach;with chair alarm set;with family/visitor present   Nurse Communication Mobility status        Time: 1050-1110 OT Time Calculation (min): 20 min  Charges: OT General Charges $OT Visit: 1 Visit OT Treatments $Self Care/Home Management : 8-22 mins  Darreld Hoffer OT/L Acute Rehabilitation Department  [phone]  08/21/2023, 11:24 AM

## 2023-08-21 NOTE — Plan of Care (Signed)

## 2023-08-21 NOTE — TOC Transition Note (Signed)
 Transition of Care [facility]) - Discharge Note   Patient Details  Name: [name] [name] MRN: [id number] Date of Birth: 22-Sep-1927  Transition of Care [facility]) CM/SW Contact:  Amaryllis Junior, LCSW Phone Number: 08/21/2023, 1:25 PM   Clinical Narrative:    Pt medically ready to dc to [facility]. Call report [phone] room 203. Dc packet with signed DNR left at nurses station. PTAR called at 11:23am. No further TOC needs.    Final next level of care: Skilled Nursing Facility Barriers to Discharge: Barriers Resolved   Patient Goals and CMS Choice Patient states their goals for this hospitalization and ongoing recovery are:: return home following STR CMS Medicare.gov Compare Post Acute Care list provided to:: Patient Choice offered to / list presented to : Patient [place] ownership interest in [facility].provided to:: Patient    Discharge Placement                       Discharge Plan and Services Additional resources added to the After Visit Summary for                  DME Arranged: N/A DME Agency: NA       HH Arranged: NA HH Agency: NA        Social Drivers of Health (SDOH) Interventions SDOH Screenings   Food Insecurity: No Food Insecurity (08/13/2023)  Housing: Low Risk  (08/13/2023)  Transportation Needs: No Transportation Needs (08/13/2023)  Utilities: Not At Risk (08/13/2023)  Depression (PHQ2-9): Low Risk  (06/05/2023)  Social Connections: Moderately Isolated (08/13/2023)  Tobacco Use: Low Risk  (08/13/2023)     Readmission Risk Interventions    08/21/2023   12:14 PM 10/19/2021    4:01 PM  Readmission Risk Prevention Plan  Post Dischage Appt Complete   Medication Screening Complete   Transportation Screening Complete Complete  PCP or Specialist Appt within 3-5 Days  Complete  HRI or Home Care Consult  Complete  Social Work Consult for Recovery Care Planning/Counseling  Complete  Palliative Care Screening  Not Complete  Medication Review Furniture conservator/restorer)  Complete

## 2023-08-21 NOTE — Progress Notes (Signed)
 Physical Therapy Treatment Patient Details Name: [name] [name] MRN: [id number] DOB: December 06, 1927 Today's Date: 08/21/2023   History of Present Illness 89 y.o female with severe hypocalcemia. PMH: postsurgical hypothyroidism, thyroid  cancer, recurrent UTIs, chronic kidney disease stage III, hyperlipidemia, ORIF L radial fx, osteopenia    PT Comments  Patient resting in recliner and agreeable to mobilize with therapy visit. Focus of session on functional activity tolerance and endurance with repeat sit<>stands and gait training today. Pt required cues for hand placement with sit<>stand and recalled towards EOS. Able to amb 2x~8' with mod assist to guide walker and facilitate upright posture. Will continue to progress pt as able during acute stay. Patient will benefit from continued inpatient follow up therapy, <3 hours/day.   If plan is discharge home, recommend the following: A lot of help with walking and/or transfers;A lot of help with bathing/dressing/bathroom;Assistance with cooking/housework;Assist for transportation;Help with stairs or ramp for entrance;Direct supervision/assist for medications management   Can travel by private vehicle     No  Equipment Recommendations  None recommended by PT    Recommendations for Other Services       Precautions / Restrictions Precautions Precautions: Fall Precaution/Restrictions Comments: HOH Restrictions Weight Bearing Restrictions Per Provider Order: No     Mobility  Bed Mobility               General bed mobility comments: pt OOB in recliner    Transfers Overall transfer level: Needs assistance Equipment used: Rolling walker (2 wheels) Transfers: Sit to/from Stand Sit to Stand: Min assist, Mod assist           General transfer comment: cues for weight shifting and hand placement to power up to RW    Ambulation/Gait Ambulation/Gait assistance: Mod assist Gait Distance (Feet): 8 Feet (2x8) Assistive device: Rolling  walker (2 wheels) Gait Pattern/deviations: Step-through pattern, Decreased stride length, Trunk flexed Gait velocity: decr         Stairs             Wheelchair Mobility     Tilt Bed    Modified Rankin (Stroke Patients Only)       Balance Overall balance assessment: Needs assistance Sitting-balance support: Feet supported Sitting balance-Leahy Scale: Fair     Standing balance support: Reliant on assistive device for balance, During functional activity, Bilateral upper extremity supported Standing balance-Leahy Scale: Poor                              Communication Communication Communication: Impaired Factors Affecting Communication: Hearing impaired  Cognition Arousal: Alert Behavior During Therapy: WFL for tasks assessed/performed   PT - Cognitive impairments: Difficult to assess Difficult to assess due to: Hard of hearing/deaf                       Following commands: Impaired Following commands impaired: Follows one step commands with increased time    Cueing Cueing Techniques: Verbal cues, Gestural cues, Tactile cues  Exercises      General Comments General comments (skin integrity, edema, etc.): no SOB on RA throughout session      Pertinent Vitals/Pain Pain Assessment Pain Assessment: Faces Faces Pain Scale: No hurt    Home Living                          Prior Function  PT Goals (current goals can now be found in the care plan section) Acute Rehab PT Goals Patient Stated Goal: family agreeable to rehab for pt to get stronger PT Goal Formulation: With family Time For Goal Achievement: 08/28/23 Potential to Achieve Goals: Good Progress towards PT goals: Progressing toward goals    Frequency    Min 2X/week      PT Plan      Co-evaluation              AM-PAC PT "6 Clicks" Mobility   Outcome Measure  Help needed turning from your back to your side while in a flat bed without  using bedrails?: A Lot Help needed moving from lying on your back to sitting on the side of a flat bed without using bedrails?: A Lot Help needed moving to and from a bed to a chair (including a wheelchair)?: A Lot Help needed standing up from a chair using your arms (e.g., wheelchair or bedside chair)?: A Lot Help needed to walk in hospital room?: Total Help needed climbing 3-5 steps with a railing? : Total 6 Click Score: 10    End of Session Equipment Utilized During Treatment: Gait belt Activity Tolerance: Patient tolerated treatment well;Patient limited by fatigue Patient left: in bed;with call bell/phone within reach;with nursing/sitter in room Nurse Communication: Mobility status PT Visit Diagnosis: Muscle weakness (generalized) (M62.81);Difficulty in walking, not elsewhere classified (R26.2)     Time: 1220-1240 PT Time Calculation (min) (ACUTE ONLY): 20 min  Charges:    $Therapeutic Exercise: 8-22 mins PT General Charges $$ ACUTE PT VISIT: 1 Visit                     Tish Forge, DPT Acute Rehabilitation Services Office [phone]  08/21/23 1:14 PM

## 2023-08-21 NOTE — Discharge Summary (Signed)
 Physician Discharge Summary   Patient: [name] [name] MRN: [id number] DOB: [birth year]  Admit date:     08/13/2023  Discharge date: 08/21/23  Discharge Physician: Ozell Blunt   PCP: Rodney Clamp, MD   Recommendations at discharge:   Check serum calcium  in 1 week Follow-up with endocrinology as outpatient  Discharge Diagnoses: Principal Problem:   Hypocalcemia Active Problems:   Malignant neoplasm of thyroid  gland (HCC)   HYPOTHYROIDISM, POSTSURGICAL   HYPOPARATHYROIDISM   HLD (hyperlipidemia)   CKD (chronic kidney disease), stage III (HCC)  Resolved Problems:   * No resolved hospital problems. *  Hospital Course: 89 y.o. female with medical history significant of postsurgical hypothyroidism, history of thyroid  cancer, recurrent UTIs, chronic kidney disease stage III, hyperlipidemia, who lives with family at home found to have markedly low calcium , elevated TSH.  She is admitted for further management and evaluation.     Assessment and Plan:  Severe hypocalcemia - Resolved Corrected calcium  8.8 today Poor compliance with medications per family. She has postsurgical hypoparathyroidism. She got IV calcium  gluconate x4 Continue oral calcium  carbonate on a regular basis.  PTH level 22. Continue calcitriol  treatment. Discussed with Dr. Rosalea Collin, endocrinologist - continue calcium  supplementation, calcitriol , levothyroxine . Outpatient follow up.   Hypothyroidism: Postsurgical.  TSH elevated at 15. Free T4 1.45, T3<20. Levothyroxine  dose changed from 88 to 100mcg.    Acute metabolic encephalopathy- In the setting of electrolyte abnormalities. Her mental status improved. On and off confused per RN. Avoid sedatives, opiates. Stopped gabapentin . Continue neuro checks. Delirium precautions. PT/ OT recommended STR.   History of recurrent UTIs:  Urinalysis unremarkable.   Elevated LFTs: trending down. Right upper quadrant ultrasound showed cholelithiasis and sludge  without definitive gallbladder wall thickening or sonographic Murphy sign    Chronic kidney disease stage IV:  Avoid nephrotoxic drugs. Continue to monitor daily renal function.   Severe hearing loss:  Poor historian due to hearing loss. Family states they sometimes use writing to communicate.   Essential hypertension:  BP stable.    Hypokalemia - Replete          Consultants:  Procedures performed:  Disposition: Skilled nursing facility Diet recommendation:  Discharge Diet Orders (From admission, onward)     Start     Ordered   08/21/23 0000  Diet - low sodium heart healthy        08/21/23 1055           Regular diet DISCHARGE MEDICATION: Allergies as of 08/21/2023       Reactions   Verapamil  Other (See Comments)   edema         Medication List     STOP taking these medications    acetaminophen  650 MG CR tablet Commonly known as: TYLENOL  Replaced by: acetaminophen  325 MG tablet   QUEtiapine  50 MG tablet Commonly known as: SEROquel    VITAMIN D  PO       TAKE these medications    acetaminophen  325 MG tablet Commonly known as: TYLENOL  Take 2 tablets (650 mg total) by mouth every 6 (six) hours as needed for mild pain (pain score 1-3) (or Fever >/= 101). Replaces: acetaminophen  650 MG CR tablet   calcitRIOL  0.5 MCG capsule Commonly known as: ROCALTROL  Take 1 capsule by mouth once daily What changed: when to take this   calcium  citrate 950 (200 Ca) MG tablet Commonly known as: CALCITRATE - dosed in mg elemental calcium  Take 1 tablet (950 mg total) by mouth 2 (two) times daily.  docusate sodium  100 MG capsule Commonly known as: COLACE Take 100 mg by mouth at bedtime.   furosemide  20 MG tablet Commonly known as: LASIX  Take 1 tablet (20 mg total) by mouth daily as needed for fluid or edema. What changed:  medication strength how much to take reasons to take this   gabapentin  100 MG capsule Commonly known as: NEURONTIN  Take 1  capsule (100 mg total) by mouth as needed.   levothyroxine  100 MCG tablet Commonly known as: SYNTHROID  Take 1 tablet (100 mcg total) by mouth daily at 6 (six) AM. Start taking on: Aug 22, 2023 What changed:  medication strength how much to take when to take this   triamcinolone  ointment 0.5 % Commonly known as: KENALOG  Apply 1 Application topically 2 (two) times daily. as needed to affected area. What changed: Another medication with the same name was removed. Continue taking this medication, and follow the directions you see here.   valACYclovir  1000 MG tablet Commonly known as: VALTREX  Take 1 tablet (1,000 mg total) by mouth daily. Take 1 tablet daily for 5 days as needed for outbreaks What changed:  when to take this reasons to take this        Contact information for after-discharge care     Destination     [facility] Preferred SNF .   Service: Skilled Nursing Contact information: [address] [place]  [phone]                    Discharge Exam: [name] [name] Weights   08/13/23 1640 08/13/23 2100 08/18/23 0300  Weight: 56.2 kg 67.8 kg 51.1 kg   Appears in no acute distress S1-S2, regular Lungs clear to auscultation bilaterally Abdomen is soft, nontender  Condition at discharge: good  The results of significant diagnostics from this hospitalization (including imaging, microbiology, ancillary and laboratory) are listed below for reference.   Imaging Studies: US  Abdomen Limited RUQ (LIVER/GB) Result Date: 08/15/2023 CLINICAL DATA:  Elevated liver function tests. EXAM: ULTRASOUND ABDOMEN LIMITED RIGHT UPPER QUADRANT COMPARISON:  None Available. FINDINGS: Gallbladder: Cholelithiasis and sludge is noted, with largest gallstone measuring 2.6 cm. No significant gallbladder wall thickening or sonographic Murphy's sign is noted. Sludge is noted. Possible mild pericholecystic fluid. Common bile duct:  Diameter: 3 mm which is within normal limits. Liver: No focal lesion identified. Within normal limits in parenchymal echogenicity. Portal vein is patent on color Doppler imaging with normal direction of blood flow towards the liver. Other: None. IMPRESSION: Cholelithiasis and sludge is noted without definite gallbladder wall thickening or sonographic Murphy's sign. Possible mild pericholecystic fluid. If there is clinical concern for cholecystitis, HIDA scan is recommended for further evaluation. Electronically Signed   By: Rosalene Colon M.D.   On: 08/15/2023 19:25    Microbiology: Results for orders placed or performed in visit on 07/20/23  Urine Culture     Status: Abnormal   Collection Time: 07/20/23  1:57 PM   Specimen: Urine  Result Value Ref Range Status   MICRO NUMBER: [id number]  Final   SPECIMEN QUALITY: Adequate  Final   Sample Source URINE  Final   STATUS: FINAL  Final   ISOLATE 1: Escherichia coli (A)  Final    Comment: Greater than 100,000 CFU/mL of Escherichia coli      Susceptibility   Escherichia coli - URINE CULTURE, REFLEX    AMOX/CLAVULANIC <=2 Sensitive     AMPICILLIN  4 Sensitive     AMPICILLIN /SULBACTAM <=2  Sensitive     CEFAZOLIN * <=4 Not Reportable      * For infections other than uncomplicated UTI caused by E. coli, K. pneumoniae or P. mirabilis: Cefazolin  is resistant if MIC > or = 8 mcg/mL. (Distinguishing susceptible versus intermediate for isolates with MIC < or = 4 mcg/mL requires additional testing.) For uncomplicated UTI caused by E. coli, K. pneumoniae or P. mirabilis: Cefazolin  is susceptible if MIC <32 mcg/mL and predicts susceptible to the oral agents cefaclor, cefdinir , cefpodoxime, cefprozil, cefuroxime, cephalexin  and loracarbef.     CEFTAZIDIME <=1 Sensitive     CEFEPIME <=1 Sensitive     CEFTRIAXONE  <=1 Sensitive     CIPROFLOXACIN <=0.25 Sensitive     LEVOFLOXACIN <=0.12 Sensitive     GENTAMICIN <=1 Sensitive     IMIPENEM <=0.25  Sensitive     NITROFURANTOIN  <=16 Sensitive     PIP/TAZO <=4 Sensitive     TOBRAMYCIN <=1 Sensitive     TRIMETH/SULFA* <=20 Sensitive      * For infections other than uncomplicated UTI caused by E. coli, K. pneumoniae or P. mirabilis: Cefazolin  is resistant if MIC > or = 8 mcg/mL. (Distinguishing susceptible versus intermediate for isolates with MIC < or = 4 mcg/mL requires additional testing.) For uncomplicated UTI caused by E. coli, K. pneumoniae or P. mirabilis: Cefazolin  is susceptible if MIC <32 mcg/mL and predicts susceptible to the oral agents cefaclor, cefdinir , cefpodoxime, cefprozil, cefuroxime, cephalexin  and loracarbef. Legend: S = Susceptible  I = Intermediate R = Resistant  NS = Not susceptible SDD = Susceptible Dose Dependent * = Not Tested  NR = Not Reported **NN = See Therapy Comments     Labs: CBC: Recent Labs  Lab 08/18/23 0453  WBC 5.6  HGB 12.1  HCT 38.0  MCV 93.1  PLT 241   Basic Metabolic Panel: Recent Labs  Lab 08/17/23 0359 08/18/23 0453 08/19/23 0439 08/20/23 0434 08/21/23 0534  NA 135 139 136 136 135  K 3.8 3.2* 4.5 5.0 4.3  CL 103 105 104 105 104  CO2 22 24 24 23 22   GLUCOSE 120* 98 100* 95 83  BUN 42* 29* 24* 28* 23  CREATININE 1.65* 1.41* 1.02* 1.38* 1.09*  CALCIUM  6.2* 7.1* 7.6* 7.9* 8.0*   Liver Function Tests: Recent Labs  Lab 08/18/23 0453 08/19/23 0439 08/20/23 0434 08/21/23 0534  AST 63* 56* 47* 44*  ALT 77* 68* 58* 53*  ALKPHOS 69 68 60 63  BILITOT 1.1 1.4* 1.3* 1.1  PROT 5.7* 6.0* 5.7* 6.2*  ALBUMIN 2.8* 2.9* 2.7* 3.0*   CBG: No results for input(s): "GLUCAP" in the last 168 hours.  Discharge time spent: greater than 30 minutes.  Signed: Ozell Blunt, MD [facility] 08/21/2023

## 2023-08-22 ENCOUNTER — Ambulatory Visit: Admitting: Physician Assistant

## 2023-08-22 ENCOUNTER — Ambulatory Visit

## 2023-08-23 DIAGNOSIS — I10 Essential (primary) hypertension: Secondary | ICD-10-CM | POA: Diagnosis not present

## 2023-08-23 DIAGNOSIS — N1831 Chronic kidney disease, stage 3a: Secondary | ICD-10-CM | POA: Diagnosis not present

## 2023-08-23 DIAGNOSIS — G9341 Metabolic encephalopathy: Secondary | ICD-10-CM | POA: Diagnosis not present

## 2023-08-23 DIAGNOSIS — E785 Hyperlipidemia, unspecified: Secondary | ICD-10-CM | POA: Diagnosis not present

## 2023-08-24 ENCOUNTER — Other Ambulatory Visit (HOSPITAL_COMMUNITY): Payer: Self-pay

## 2023-08-28 ENCOUNTER — Other Ambulatory Visit: Payer: Self-pay | Admitting: Internal Medicine

## 2023-08-28 DIAGNOSIS — E209 Hypoparathyroidism, unspecified: Secondary | ICD-10-CM | POA: Diagnosis not present

## 2023-08-28 DIAGNOSIS — N1831 Chronic kidney disease, stage 3a: Secondary | ICD-10-CM | POA: Diagnosis not present

## 2023-08-28 DIAGNOSIS — R600 Localized edema: Secondary | ICD-10-CM | POA: Diagnosis not present

## 2023-09-03 ENCOUNTER — Telehealth: Admitting: Licensed Clinical Social Worker

## 2023-09-04 ENCOUNTER — Encounter (HOSPITAL_COMMUNITY): Payer: Self-pay

## 2023-09-04 ENCOUNTER — Emergency Department (HOSPITAL_COMMUNITY)

## 2023-09-04 ENCOUNTER — Other Ambulatory Visit: Payer: Self-pay

## 2023-09-04 ENCOUNTER — Inpatient Hospital Stay (HOSPITAL_COMMUNITY)
Admission: EM | Admit: 2023-09-04 | Discharge: 2023-09-08 | DRG: 291 | Disposition: A | Discharge status: Hospice | Source: Skilled Nursing Facility | Attending: Internal Medicine | Admitting: Internal Medicine

## 2023-09-04 DIAGNOSIS — N1831 Chronic kidney disease, stage 3a: Secondary | ICD-10-CM | POA: Diagnosis present

## 2023-09-04 DIAGNOSIS — E892 Postprocedural hypoparathyroidism: Secondary | ICD-10-CM | POA: Diagnosis present

## 2023-09-04 DIAGNOSIS — I130 Hypertensive heart and chronic kidney disease with heart failure and stage 1 through stage 4 chronic kidney disease, or unspecified chronic kidney disease: Principal | ICD-10-CM | POA: Diagnosis present

## 2023-09-04 DIAGNOSIS — L89322 Pressure ulcer of left buttock, stage 2: Secondary | ICD-10-CM | POA: Diagnosis present

## 2023-09-04 DIAGNOSIS — K838 Other specified diseases of biliary tract: Secondary | ICD-10-CM | POA: Diagnosis not present

## 2023-09-04 DIAGNOSIS — H9190 Unspecified hearing loss, unspecified ear: Secondary | ICD-10-CM | POA: Diagnosis present

## 2023-09-04 DIAGNOSIS — I5084 End stage heart failure: Secondary | ICD-10-CM | POA: Diagnosis present

## 2023-09-04 DIAGNOSIS — J90 Pleural effusion, not elsewhere classified: Secondary | ICD-10-CM | POA: Diagnosis not present

## 2023-09-04 DIAGNOSIS — Z79890 Hormone replacement therapy (postmenopausal): Secondary | ICD-10-CM

## 2023-09-04 DIAGNOSIS — I509 Heart failure, unspecified: Secondary | ICD-10-CM | POA: Diagnosis not present

## 2023-09-04 DIAGNOSIS — E890 Postprocedural hypothyroidism: Secondary | ICD-10-CM | POA: Diagnosis present

## 2023-09-04 DIAGNOSIS — N179 Acute kidney failure, unspecified: Secondary | ICD-10-CM | POA: Diagnosis present

## 2023-09-04 DIAGNOSIS — I739 Peripheral vascular disease, unspecified: Secondary | ICD-10-CM | POA: Diagnosis present

## 2023-09-04 DIAGNOSIS — I5033 Acute on chronic diastolic (congestive) heart failure: Secondary | ICD-10-CM

## 2023-09-04 DIAGNOSIS — I878 Other specified disorders of veins: Secondary | ICD-10-CM | POA: Diagnosis present

## 2023-09-04 DIAGNOSIS — K828 Other specified diseases of gallbladder: Secondary | ICD-10-CM | POA: Diagnosis not present

## 2023-09-04 DIAGNOSIS — J9601 Acute respiratory failure with hypoxia: Principal | ICD-10-CM | POA: Diagnosis present

## 2023-09-04 DIAGNOSIS — Z7409 Other reduced mobility: Secondary | ICD-10-CM | POA: Diagnosis present

## 2023-09-04 DIAGNOSIS — Z515 Encounter for palliative care: Secondary | ICD-10-CM | POA: Diagnosis not present

## 2023-09-04 DIAGNOSIS — K5792 Diverticulitis of intestine, part unspecified, without perforation or abscess without bleeding: Secondary | ICD-10-CM | POA: Diagnosis not present

## 2023-09-04 DIAGNOSIS — R0602 Shortness of breath: Secondary | ICD-10-CM | POA: Diagnosis not present

## 2023-09-04 DIAGNOSIS — M7989 Other specified soft tissue disorders: Secondary | ICD-10-CM | POA: Diagnosis not present

## 2023-09-04 DIAGNOSIS — R4182 Altered mental status, unspecified: Secondary | ICD-10-CM | POA: Diagnosis not present

## 2023-09-04 DIAGNOSIS — Z66 Do not resuscitate: Secondary | ICD-10-CM | POA: Diagnosis present

## 2023-09-04 DIAGNOSIS — Z90710 Acquired absence of both cervix and uterus: Secondary | ICD-10-CM

## 2023-09-04 DIAGNOSIS — Z85850 Personal history of malignant neoplasm of thyroid: Secondary | ICD-10-CM

## 2023-09-04 DIAGNOSIS — E785 Hyperlipidemia, unspecified: Secondary | ICD-10-CM | POA: Diagnosis present

## 2023-09-04 DIAGNOSIS — R4189 Other symptoms and signs involving cognitive functions and awareness: Secondary | ICD-10-CM | POA: Diagnosis not present

## 2023-09-04 DIAGNOSIS — I071 Rheumatic tricuspid insufficiency: Secondary | ICD-10-CM | POA: Diagnosis present

## 2023-09-04 DIAGNOSIS — R54 Age-related physical debility: Secondary | ICD-10-CM | POA: Diagnosis present

## 2023-09-04 DIAGNOSIS — Z91148 Patient's other noncompliance with medication regimen for other reason: Secondary | ICD-10-CM

## 2023-09-04 DIAGNOSIS — C73 Malignant neoplasm of thyroid gland: Secondary | ICD-10-CM | POA: Diagnosis present

## 2023-09-04 DIAGNOSIS — I700 Atherosclerosis of aorta: Secondary | ICD-10-CM | POA: Diagnosis not present

## 2023-09-04 DIAGNOSIS — Z833 Family history of diabetes mellitus: Secondary | ICD-10-CM | POA: Diagnosis not present

## 2023-09-04 DIAGNOSIS — R918 Other nonspecific abnormal finding of lung field: Secondary | ICD-10-CM | POA: Diagnosis not present

## 2023-09-04 DIAGNOSIS — I080 Rheumatic disorders of both mitral and aortic valves: Secondary | ICD-10-CM | POA: Diagnosis present

## 2023-09-04 DIAGNOSIS — J811 Chronic pulmonary edema: Secondary | ICD-10-CM | POA: Diagnosis not present

## 2023-09-04 DIAGNOSIS — K5730 Diverticulosis of large intestine without perforation or abscess without bleeding: Secondary | ICD-10-CM | POA: Diagnosis not present

## 2023-09-04 DIAGNOSIS — Z79899 Other long term (current) drug therapy: Secondary | ICD-10-CM

## 2023-09-04 DIAGNOSIS — E8721 Acute metabolic acidosis: Secondary | ICD-10-CM | POA: Diagnosis present

## 2023-09-04 LAB — I-STAT CG4 LACTIC ACID, ED: Lactic Acid, Venous: 2.2 mmol/L (ref 0.5–1.9)

## 2023-09-04 LAB — COMPREHENSIVE METABOLIC PANEL WITH GFR
ALT: 59 U/L — ABNORMAL HIGH (ref 0–44)
AST: 64 U/L — ABNORMAL HIGH (ref 15–41)
Albumin: 3.3 g/dL — ABNORMAL LOW (ref 3.5–5.0)
Alkaline Phosphatase: 70 U/L (ref 38–126)
Anion gap: 12 (ref 5–15)
BUN: 44 mg/dL — ABNORMAL HIGH (ref 8–23)
CO2: 21 mmol/L — ABNORMAL LOW (ref 22–32)
Calcium: 9.5 mg/dL (ref 8.9–10.3)
Chloride: 101 mmol/L (ref 98–111)
Creatinine, Ser: 1.68 mg/dL — ABNORMAL HIGH (ref 0.44–1.00)
GFR, Estimated: 28 mL/min — ABNORMAL LOW (ref >60)
Glucose, Bld: 143 mg/dL — ABNORMAL HIGH (ref 70–99)
Potassium: 4.6 mmol/L (ref 3.5–5.1)
Sodium: 134 mmol/L — ABNORMAL LOW (ref 135–145)
Total Bilirubin: 1.1 mg/dL (ref 0.0–1.2)
Total Protein: 6.9 g/dL (ref 6.5–8.1)

## 2023-09-04 LAB — CBC WITH DIFFERENTIAL/PLATELET
Abs Immature Granulocytes: 0.02 10*3/uL (ref 0.00–0.07)
Basophils Absolute: 0.1 10*3/uL (ref 0.0–0.1)
Basophils Relative: 1 %
Eosinophils Absolute: 0 10*3/uL (ref 0.0–0.5)
Eosinophils Relative: 1 %
HCT: 37.5 % (ref 36.0–46.0)
Hemoglobin: 12.1 g/dL (ref 12.0–15.0)
Immature Granulocytes: 0 %
Lymphocytes Relative: 20 %
Lymphs Abs: 1.1 10*3/uL (ref 0.7–4.0)
MCH: 29.7 pg (ref 26.0–34.0)
MCHC: 32.3 g/dL (ref 30.0–36.0)
MCV: 91.9 fL (ref 80.0–100.0)
Monocytes Absolute: 0.5 10*3/uL (ref 0.1–1.0)
Monocytes Relative: 8 %
Neutro Abs: 3.8 10*3/uL (ref 1.7–7.7)
Neutrophils Relative %: 70 %
Platelets: 259 10*3/uL (ref 150–400)
RBC: 4.08 MIL/uL (ref 3.87–5.11)
RDW: 15.7 % — ABNORMAL HIGH (ref 11.5–15.5)
WBC: 5.5 10*3/uL (ref 4.0–10.5)
nRBC: 0.4 % — ABNORMAL HIGH (ref 0.0–0.2)

## 2023-09-04 LAB — I-STAT CHEM 8, ED
BUN: 39 mg/dL — ABNORMAL HIGH (ref 8–23)
Calcium, Ion: 1.19 mmol/L (ref 1.15–1.40)
Chloride: 103 mmol/L (ref 98–111)
Creatinine, Ser: 1.8 mg/dL — ABNORMAL HIGH (ref 0.44–1.00)
Glucose, Bld: 139 mg/dL — ABNORMAL HIGH (ref 70–99)
HCT: 39 % (ref 36.0–46.0)
Hemoglobin: 13.3 g/dL (ref 12.0–15.0)
Potassium: 4.6 mmol/L (ref 3.5–5.1)
Sodium: 136 mmol/L (ref 135–145)
TCO2: 22 mmol/L (ref 22–32)

## 2023-09-04 LAB — TROPONIN I (HIGH SENSITIVITY): Troponin I (High Sensitivity): 31 ng/L — ABNORMAL HIGH (ref <18)

## 2023-09-04 LAB — BRAIN NATRIURETIC PEPTIDE: B Natriuretic Peptide: >4500 pg/mL — ABNORMAL HIGH (ref 0.0–100.0)

## 2023-09-04 LAB — T4, FREE: Free T4: 1.2 ng/dL — ABNORMAL HIGH (ref 0.61–1.12)

## 2023-09-04 LAB — TSH: TSH: 17.048 u[IU]/mL — ABNORMAL HIGH (ref 0.350–4.500)

## 2023-09-04 MED ORDER — LORAZEPAM 0.5 MG PO TABS
0.5000 mg | ORAL_TABLET | Freq: Four times a day (QID) | ORAL | Status: DC | PRN
  Administered 2023-09-05 – 2023-09-08 (×4): 0.5 mg via ORAL
  Filled 2023-09-04 (×4): qty 1

## 2023-09-04 MED ORDER — PROCHLORPERAZINE EDISYLATE 10 MG/2ML IJ SOLN: 5.0000 mg | Freq: Four times a day (QID) | INTRAMUSCULAR | Status: DC | PRN

## 2023-09-04 MED ORDER — ACETAMINOPHEN 650 MG RE SUPP: 650.0000 mg | Freq: Four times a day (QID) | RECTAL | Status: DC | PRN

## 2023-09-04 MED ORDER — IPRATROPIUM-ALBUTEROL 0.5-2.5 (3) MG/3ML IN SOLN: 3.0000 mL | Freq: Four times a day (QID) | RESPIRATORY_TRACT | Status: DC | PRN

## 2023-09-04 MED ORDER — CALCITRIOL 0.5 MCG PO CAPS
0.5000 ug | ORAL_CAPSULE | Freq: Every day | ORAL | Status: DC
  Administered 2023-09-05 – 2023-09-07 (×2): 0.5 ug via ORAL
  Filled 2023-09-04 (×4): qty 1

## 2023-09-04 MED ORDER — FUROSEMIDE 10 MG/ML IJ SOLN
40.0000 mg | Freq: Two times a day (BID) | INTRAMUSCULAR | Status: DC
  Administered 2023-09-04 – 2023-09-05 (×3): 40 mg via INTRAVENOUS
  Filled 2023-09-04 (×3): qty 4

## 2023-09-04 MED ORDER — SODIUM CHLORIDE 0.9 % IV SOLN: Freq: Once | INTRAVENOUS | Status: DC

## 2023-09-04 MED ORDER — ACETAMINOPHEN 325 MG PO TABS: 650.0000 mg | ORAL_TABLET | Freq: Four times a day (QID) | ORAL | Status: DC | PRN

## 2023-09-04 MED ORDER — HEPARIN SODIUM (PORCINE) 5000 UNIT/ML IJ SOLN
5000.0000 [IU] | Freq: Three times a day (TID) | INTRAMUSCULAR | Status: DC
  Administered 2023-09-04 – 2023-09-05 (×2): 5000 [IU] via SUBCUTANEOUS
  Filled 2023-09-04 (×2): qty 1

## 2023-09-04 MED ORDER — LEVOTHYROXINE SODIUM 100 MCG PO TABS
100.0000 ug | ORAL_TABLET | Freq: Every day | ORAL | Status: DC
  Administered 2023-09-05 – 2023-09-08 (×3): 100 ug via ORAL
  Filled 2023-09-04 (×3): qty 1

## 2023-09-04 MED ORDER — SODIUM CHLORIDE 0.9% FLUSH
3.0000 mL | Freq: Two times a day (BID) | INTRAVENOUS | Status: DC
  Administered 2023-09-04 – 2023-09-08 (×6): 3 mL via INTRAVENOUS

## 2023-09-04 MED ORDER — VANCOMYCIN HCL 1.25 G IV SOLR
1250.0000 mg | Freq: Once | INTRAVENOUS | Status: AC
  Administered 2023-09-04: 1250 mg via INTRAVENOUS
  Filled 2023-09-04: qty 25

## 2023-09-04 MED ORDER — SODIUM CHLORIDE 0.9 % IV SOLN
2.0000 g | Freq: Once | INTRAVENOUS | Status: AC
  Administered 2023-09-04: 2 g via INTRAVENOUS
  Filled 2023-09-04: qty 12.5

## 2023-09-04 MED ORDER — SENNOSIDES-DOCUSATE SODIUM 8.6-50 MG PO TABS: 1.0000 | ORAL_TABLET | Freq: Every evening | ORAL | Status: DC | PRN

## 2023-09-04 NOTE — Progress Notes (Signed)
 ED Pharmacy Antibiotic Sign Off An antibiotic consult was received from an ED provider for vancomycin  and cefepime per pharmacy dosing for sepsis/pneumonia. A chart review was completed to assess appropriateness.   The following one time order(s) were placed:  Vancomycin  1250 mg Cefepime 2 g  Further antibiotic and/or antibiotic pharmacy consults should be ordered by the admitting provider if indicated.   Thank you for allowing pharmacy to be a part of this patient's care.   Lolita Rise, PharmD, BCPS Clinical Pharmacist 09/04/2023 7:21 PM

## 2023-09-04 NOTE — H&P (Signed)
 History and Physical    [name] [name] WUJ:[id number] DOB: [birth year] DOA: 09/04/2023  PCP: Rodney Clamp, MD   Patient coming from: SNF   Chief Complaint: SOB   HPI: [name] [name] is a 89 y.o. female with medical history significant for thyroid  cancer, postsurgical hypothyroidism, CKD 3A, chronic HFpEF, now presenting with shortness of breath.  Patient was recently given Lasix  at her SNF for increased leg swelling.  This morning, she is said to have had difficulty breathing.  Blood work was obtained and she is reported to have an elevated D-dimer.  Oxygen saturation dropped to 80% on room air with EMS and she was placed on supplemental oxygen.  Patient is unable to contribute much of anything to the history.  She had been given Ativan earlier today.  ED Course: Upon arrival to the ED, patient is found to be afebrile and saturating well on room air with tachypnea, normal HR, and stable BP.  Labs are most notable for creatinine 1.68, TSH 17.048, normal WBC, troponin 31, and BNP >4500.  CT demonstrates severe cardiomegaly with mild pulmonary edema, moderate bilateral pleural effusions, and anasarca.  Patient was treated with vancomycin  and cefepime in the ED.  Review of Systems:  ROS limited by patient's clinical condition.  Past Medical History:  Diagnosis Date   ALLERGIC RHINITIS 11/02/2006   CKD (chronic kidney disease), stage III (HCC)    HYPERLIPIDEMIA 11/04/2006   HYPOTHYROIDISM, POSTSURGICAL 03/28/2007   Malignant neoplasm of thyroid  gland (HCC) 03/28/2007   5/07: righte lobectomy--path shows 3 cm follicular ca 8/07: completion left lobectomy, no more tumor seen 09/07: thyroglobulin 0.2 9ab neg) 11/07: i-131 rx, 101 [facility] 6/08: thyroglogulin 0.2 (ab neg) , hypothyroid body scan neg (clinically tolerated hypothyroidism poorly) 06/09: tg undetectable (ab=1.5, normal) 12/10: tg undetectable (ab neg) 6/11: tg undectable (ab neg)   OSTEOPENIA 11/02/2006    Past Surgical History:   Procedure Laterality Date   ABDOMINAL HYSTERECTOMY     CERVICAL POLYPECTOMY     OPEN REDUCTION INTERNAL FIXATION (ORIF) DISTAL RADIAL FRACTURE Left 01/14/2014   Procedure: OPEN REDUCTION INTERNAL FIXATION DISTAL RADIAL FRACTURE;  Surgeon: Shellie Dials, MD;  Location: [facility] OR;  Service: Orthopedics;  Laterality: Left;    Social History:   reports that she has never smoked. She has never used smokeless tobacco. She reports that she does not currently use alcohol. She reports that she does not use drugs.  Allergies  Allergen Reactions   Verapamil  Other (See Comments)    Edema    Family History  Problem Relation Age of Onset   Diabetes Sister      Prior to Admission medications   Medication Sig Start Date End Date Taking? Authorizing Provider  acetaminophen  (TYLENOL ) 325 MG tablet Take 2 tablets (650 mg total) by mouth every 6 (six) hours as needed for mild pain (pain score 1-3) (or Fever >/= 101). 08/21/23  Yes Ozell Blunt, MD  calcitRIOL  (ROCALTROL ) 0.5 MCG capsule Take 1 capsule by mouth once daily 08/28/23  Yes Shamleffer, Ibtehal Jaralla, MD  calcium  citrate (CALCITRATE - DOSED IN MG ELEMENTAL CALCIUM ) 950 (200 Ca) MG tablet Take 1 tablet (950 mg total) by mouth 2 (two) times daily. 08/21/23  Yes Ozell Blunt, MD  docusate sodium  (COLACE) 100 MG capsule Take 100 mg by mouth at bedtime.    Yes [provider]  furosemide  (LASIX ) 10 MG/ML injection Inject 60 mg into the muscle once as needed (for CHF exacerbation).   Yes [provider]  furosemide  (LASIX ) 20 MG tablet Take 1 tablet (20 mg total) by mouth daily as needed for fluid or edema. 08/21/23  Yes Ozell Blunt, MD  gabapentin  (NEURONTIN ) 100 MG capsule Take 1 capsule (100 mg total) by mouth as needed. Patient taking differently: Take 100 mg by mouth as needed (for pain). 08/21/23  Yes Lama, Benuel Brazier, MD  ipratropium-albuterol  (DUONEB) 0.5-2.5 (3) MG/3ML SOLN Take 3 mLs by nebulization every 4 (four) hours as  needed (FOR SHORTNESS OF BREATH OR WHEEZING).   Yes [provider]  levothyroxine  (SYNTHROID ) 100 MCG tablet Take 1 tablet (100 mcg total) by mouth daily at 6 (six) AM. 08/22/23  Yes Alfonse Angle, Benuel Brazier, MD  LORazepam (ATIVAN) 0.5 MG tablet Take 0.5 mg by mouth every 6 (six) hours as needed for anxiety (or agitation).   Yes [provider]  OXYGEN Inhale 2 L/min into the lungs continuous.   Yes [provider]  spironolactone (ALDACTONE) 25 MG tablet Take 25 mg by mouth daily. 09/04/23  Yes [provider]  triamcinolone  ointment (KENALOG ) 0.5 % Apply 1 Application topically 2 (two) times daily. as needed to affected area. Patient taking differently: Apply 1 Application topically 2 (two) times daily as needed (FOR IRRITATION- AFFECTED AREAS). 09/08/22  Yes Rodney Clamp, MD  valACYclovir  (VALTREX ) 1000 MG tablet Take 1 tablet (1,000 mg total) by mouth daily. Take 1 tablet daily for 5 days as needed for outbreaks Patient taking differently: Take 1,000 mg by mouth See admin instructions. Take 1,000 mg by mouth once a day for 5 days as needed for HSV 03/19/23  Yes Rodney Clamp, MD    Physical Exam: Vitals:   09/04/23 1512 09/04/23 1700 09/04/23 1924  BP: (!) 143/98 (!) 125/98   Pulse: 78 93   Resp:  (!) 22   Temp: (!) 97.5 F (36.4 C)  97.9 F (36.6 C)  TempSrc: Axillary    SpO2: 98% 100%     Constitutional: NAD, no diaphoresis   Eyes: PERTLA, lids and conjunctivae normal ENMT: Mucous membranes are moist. Posterior pharynx clear of any exudate or lesions.   Neck: supple, no masses  Respiratory: Fine rales bilaterally. No wheezing. Tachypneic.   Cardiovascular: S1 & S2 heard, regular rate and rhythm. Bilateral LE edema. JVD noted. Abdomen: No tenderness, soft. Bowel sounds active.  Musculoskeletal: no clubbing / cyanosis. No joint deformity upper and lower extremities.   Skin: no significant rashes, lesions, ulcers. Warm, dry, well-perfused. Neurologic: CN  2-12 grossly intact. Moving all extremities. Sleeping, wakes to voice and oriented to person only.  Psychiatric: Calm. Cooperative.    Labs and Imaging on Admission: I have personally reviewed following labs and imaging studies  CBC: Recent Labs  Lab 09/04/23 1612 09/04/23 1654  WBC 5.5  --   NEUTROABS 3.8  --   HGB 12.1 13.3  HCT 37.5 39.0  MCV 91.9  --   PLT 259  --    Basic Metabolic Panel: Recent Labs  Lab 09/04/23 1612 09/04/23 1654  NA 134* 136  K 4.6 4.6  CL 101 103  CO2 21*  --   GLUCOSE 143* 139*  BUN 44* 39*  CREATININE 1.68* 1.80*  CALCIUM  9.5  --    GFR: CrCl cannot be calculated (Unknown ideal weight.). Liver Function Tests: Recent Labs  Lab 09/04/23 1612  AST 64*  ALT 59*  ALKPHOS 70  BILITOT 1.1  PROT 6.9  ALBUMIN 3.3*   No results for input(s): "  LIPASE", "AMYLASE" in the last 168 hours. No results for input(s): "AMMONIA" in the last 168 hours. Coagulation Profile: No results for input(s): "INR", "PROTIME" in the last 168 hours. Cardiac Enzymes: No results for input(s): "CKTOTAL", "CKMB", "CKMBINDEX", "TROPONINI" in the last 168 hours. BNP (last 3 results) No results for input(s): "PROBNP" in the last 8760 hours. HbA1C: No results for input(s): "HGBA1C" in the last 72 hours. CBG: No results for input(s): "GLUCAP" in the last 168 hours. Lipid Profile: No results for input(s): "CHOL", "HDL", "LDLCALC", "TRIG", "CHOLHDL", "LDLDIRECT" in the last 72 hours. Thyroid  Function Tests: Recent Labs    09/04/23 1612  TSH 17.048*   Anemia Panel: No results for input(s): "VITAMINB12", "FOLATE", "FERRITIN", "TIBC", "IRON", "RETICCTPCT" in the last 72 hours. Urine analysis:    Component Value Date/Time   COLORURINE YELLOW 08/14/2023 0343   APPEARANCEUR CLEAR 08/14/2023 0343   LABSPEC 1.014 08/14/2023 0343   PHURINE 5.0 08/14/2023 0343   GLUCOSEU NEGATIVE 08/14/2023 0343   GLUCOSEU NEGATIVE 07/20/2023 1357   HGBUR NEGATIVE 08/14/2023 0343    BILIRUBINUR NEGATIVE 08/14/2023 0343   BILIRUBINUR Negative 10/13/2022 1110   KETONESUR NEGATIVE 08/14/2023 0343   PROTEINUR 100 (A) 08/14/2023 0343   UROBILINOGEN 0.2 07/20/2023 1357   NITRITE NEGATIVE 08/14/2023 0343   LEUKOCYTESUR NEGATIVE 08/14/2023 0343   Sepsis Labs: @LABRCNTIP (procalcitonin:4,lacticidven:4) )No results found for this or any previous visit (from the past 240 hours).   Radiological Exams on Admission: CT ABDOMEN PELVIS WO CONTRAST Result Date: 09/04/2023 CLINICAL DATA:  Abdominal pain, acute, nonlocalized EXAM: CT ABDOMEN AND PELVIS WITHOUT CONTRAST TECHNIQUE: Multidetector CT imaging of the abdomen and pelvis was performed following the standard protocol without IV contrast. RADIATION DOSE REDUCTION: This exam was performed according to the departmental dose-optimization program which includes automated exposure control, adjustment of the mA and/or kV according to patient size and/or use of iterative reconstruction technique. COMPARISON:  Aug 15, 2023, FINDINGS: Motion degraded study. Of note, the lack of intravenous contrast limits evaluation of the solid organ parenchyma and vascularity. Lower chest: For findings above the diaphragm, please see the separately dictated CT of the chest report, which was performed concurrently. Hepatobiliary: No mass.Distended gallbladder containing a moderate amount of biliary sludge and nonradiopaque stones. No intrahepatic or extrahepatic biliary ductal dilation. Pancreas: No mass or main ductal dilation. No peripancreatic inflammation or fluid collection. Spleen: Normal size. No mass. Adrenals/Urinary Tract: No adrenal masses. 1 cm exophytic cyst along the left lower pole. No hydronephrosis or nephrolithiasis. Circumferential wall thickening of the urinary bladder. A few bladder diverticula are also noted posteriorly. Stomach/Bowel: The stomach is decompressed without focal abnormality. No small bowel wall thickening or inflammation. No small  bowel obstruction.Normal appendix. Total colonic diverticulosis. No changes of acute diverticulitis. Vascular/Lymphatic: No aortic aneurysm. Diffuse aortoiliac atherosclerosis. No intraabdominal or pelvic lymphadenopathy. Reproductive: Hysterectomy. No concerning adnexal mass. Small volume layering free fluid in the pelvis. Other: No pneumoperitoneum. Small volume perihepatic and perisplenic ascites. Musculoskeletal: No acute fracture or destructive lesion. Diffuse anasarca. Osteopenia. Multilevel degenerative disc disease of the spine. Mild bilateral hip osteoarthritis. IMPRESSION: 1. Circumferential wall thickening of the urinary bladder, which may be due to underdistension. If there is concern for acute cystitis, correlation with urinalysis would be recommended. 2. Small volume perihepatic and perisplenic ascites with diffuse anasarca, likely related to patient's volume status. 3. Distended gallbladder containing a moderate amount of biliary sludge and non radiopaque stones. No changes to suggest acute cholecystitis. 4. Total colonic diverticulosis. No changes of acute diverticulitis. Electronically  Signed   By: Rance Burrows M.D.   On: 09/04/2023 19:06   CT Head Wo Contrast Result Date: 09/04/2023 CLINICAL DATA:  Mental status change, unknown cause EXAM: CT HEAD WITHOUT CONTRAST TECHNIQUE: Contiguous axial images were obtained from the base of the skull through the vertex without intravenous contrast. RADIATION DOSE REDUCTION: This exam was performed according to the departmental dose-optimization program which includes automated exposure control, adjustment of the mA and/or kV according to patient size and/or use of iterative reconstruction technique. COMPARISON:  CT head 12/25/2022., CT head 04/24/2021 FINDINGS: Brain: Patchy and confluent areas of decreased attenuation are noted throughout the deep and periventricular white matter of the cerebral hemispheres bilaterally, compatible with chronic  microvascular ischemic disease. No evidence of large-territorial acute infarction. No parenchymal hemorrhage. No mass lesion. No extra-axial collection. No mass effect or midline shift. No hydrocephalus. Basilar cisterns are patent. Vascular: No hyperdense vessel. Patchy and confluent areas of decreased attenuation are noted throughout the deep and periventricular white matter of the cerebral hemispheres bilaterally, compatible with chronic microvascular ischemic disease. Skull: No acute fracture or focal lesion. Sinuses/Orbits: Paranasal sinuses and mastoid air cells are clear. The orbits are unremarkable. Other: None. IMPRESSION: No acute intracranial abnormality. Electronically Signed   By: Morgane  Naveau M.D.   On: 09/04/2023 18:59   CT CHEST WO CONTRAST Result Date: 09/04/2023 CLINICAL DATA:  Pneumonia, complication suspected, xray done EXAM: CT CHEST WITHOUT CONTRAST TECHNIQUE: Multidetector CT imaging of the chest was performed following the standard protocol without IV contrast. RADIATION DOSE REDUCTION: This exam was performed according to the departmental dose-optimization program which includes automated exposure control, adjustment of the mA and/or kV according to patient size and/or use of iterative reconstruction technique. COMPARISON:  February 20, 2022 FINDINGS: Cardiovascular: Severe cardiomegaly. Dense multi-vessel coronary atherosclerosis. Small pericardial effusion. Aortic valve and mitral annular calcification. No aortic aneurysm. Diffuse calcified atherosclerosis throughout the aorta. Mediastinum/Nodes: No mediastinal mass. Thyroidectomy. No mediastinal, hilar, or axillary lymphadenopathy. Lungs/Pleura: The midline trachea and bronchi are patent. Biapical pleuroparenchymal scarring. Subtle ground-glass airspace opacities dependently with interlobular septal thickening in both upper lobes. Small to moderate volume bilateral pleural effusions with bibasilar airspace consolidation, more so on  the right than the left. The left pleural effusion appears at least partially loculated with fluid noted in the oblique fissure. The airspace consolidation on the right demonstrates air bronchograms. No pneumothorax. Musculoskeletal: No acute fracture or destructive bone lesion. Osteopenia. Multilevel degenerative disc disease of the spine. Moderate bilateral glenohumeral joint osteoarthritis. Upper Abdomen: For findings below the diaphragm, please see the separately dictated CT of the abdomen and pelvis report, which was performed concurrently. IMPRESSION: 1. Severe cardiomegaly with findings of mild pulmonary edema. 2. Moderate volume bilateral pleural effusions with bibasilar airspace consolidation. While this likely represents bibasilar compressive atelectasis, the right lower lobe consolidation contains air bronchograms, which can be seen in underlying bronchopneumonia, in the correct clinical context. 3. For findings below the diaphragm, please see the separately dictated CT of the abdomen and pelvis report, which was performed concurrently. Aortic Atherosclerosis (ICD10-I70.0). Electronically Signed   By: Rance Burrows M.D.   On: 09/04/2023 18:58   DG Chest Port 1 View Result Date: 09/04/2023 CLINICAL DATA:  Shortness of breath starting this morning. EXAM: PORTABLE CHEST 1 VIEW COMPARISON:  03/07/2023 FINDINGS: Prominent and increased enlargement of the cardiopericardial silhouette. Pericardial effusion is not excluded. Atherosclerotic calcification of the aortic arch. Blunting of both costophrenic angles favoring small to moderate bilateral pleural effusions. Mildly indistinct  pulmonary vasculature may reflect pulmonary venous hypertension. Clips project over the lower neck, query thyroidectomy. Advanced degenerative glenohumeral arthropathy bilaterally. IMPRESSION: 1. Prominent and increased enlargement of the cardiopericardial silhouette. This could be from cardiomegaly or pericardial effusion.  Correlate with any clinical signs of tamponade such as Beck's triad. Echocardiography or chest CT could be utilized to further work up the progressive enlargement of the cardiopericardial silhouette. 2. Small to moderate bilateral pleural effusions. 3. Mildly indistinct pulmonary vasculature may reflect pulmonary venous hypertension. 4. Advanced degenerative glenohumeral arthropathy bilaterally. Electronically Signed   By: Freida Jes M.D.   On: 09/04/2023 17:43    EKG: Independently reviewed. Sinus rhythm, PAC, LBBB.   Assessment/Plan   1. Acute on chronic HFpEF  - Diurese with IV Lasix , monitor weight and I/Os, monitor renal function and electrolytes, update echocardiogram   2. Pleural effusions  - Consider thoracentesis if fail to respond to diuresis   3. AKI superimposed on CKD 3A  - Renally-dose medications, monitor closely while diuresing   4. Elevated d-dimer  - She is reported to have elevated D-dimer at her facility  - Check LE venous Doppler, consider CTA if renal function improves   5. Hypothyroidism  - TSH is 17.048 in ED  - Levothyroxine  dosing was just increased earlier this month, outpatient follow-up recommended    6. Cognitive impairment  - Delirium precautions    DVT prophylaxis: sq heparin  Code Status: DNR  Level of Care: Level of care: Progressive Family Communication: Daughter and granddaughter at bedside   Disposition Plan:  Patient is from: SNF  Anticipated d/c is to: SNF  Anticipated d/c date is: 09/07/23  Patient currently: Pending improved volume status, stable renal function  Consults called: None  Admission status: Inpatient     Walton Guppy, MD [facility]  09/04/2023, 7:35 PM

## 2023-09-04 NOTE — ED Provider Notes (Signed)
 Patient seen after prior EDP.  Patient is DNR - status confirmed by family at bedside.   Hospitalist service made aware of case.    Burnette Carte, MD 09/04/23 [phone]

## 2023-09-04 NOTE — ED Triage Notes (Signed)
 Pt BIB EMS from [facility] due to SOB since this morning and abnormal labs, elevated D-dimer facility paperwork at bedside. Pt was given ativan by daughter around 12 lunch time; pt is currently resting. When aroused pt groans but unclear if pt is in pain currently. Pt was placed on 4L O2 [place] by facility, no O2 at baseline. Hx of dementia at baseline, PE, CKD stage 3. With EMS, pt started at 96% Spo2 on RA, pt when down to 80% SpO2 unclear if pt had an episode of apnea, SpO2 corrected on arousal.

## 2023-09-04 NOTE — ED Provider Notes (Addendum)
 [place] EMERGENCY DEPARTMENT AT [facility] Provider Note   CSN: [id number] Arrival date & time: 09/04/23  1502     History  Chief Complaint  Patient presents with   Shortness of Breath   Abnormal Labs    [name] [name] is a 89 y.o. female.  89 yo F with a chief complaint of difficulty breathing.  This was reported by EMS.  Patient is unable to provide any history.  Level 5 caveat.  She comes with paperwork that states that she had an elevated D-dimer at her facility and was sent here to rule out pulmonary embolism.   Shortness of Breath      Home Medications Prior to Admission medications   Medication Sig Start Date End Date Taking? Authorizing Provider  acetaminophen  (TYLENOL ) 325 MG tablet Take 2 tablets (650 mg total) by mouth every 6 (six) hours as needed for mild pain (pain score 1-3) (or Fever >/= 101). 08/21/23   Ozell Blunt, MD  calcitRIOL  (ROCALTROL ) 0.5 MCG capsule Take 1 capsule by mouth once daily 08/28/23   Shamleffer, Ibtehal Jaralla, MD  calcium  citrate (CALCITRATE - DOSED IN MG ELEMENTAL CALCIUM ) 950 (200 Ca) MG tablet Take 1 tablet (950 mg total) by mouth 2 (two) times daily. 08/21/23   Ozell Blunt, MD  docusate sodium  (COLACE) 100 MG capsule Take 100 mg by mouth at bedtime.     [provider]  furosemide  (LASIX ) 20 MG tablet Take 1 tablet (20 mg total) by mouth daily as needed for fluid or edema. 08/21/23   Ozell Blunt, MD  gabapentin  (NEURONTIN ) 100 MG capsule Take 1 capsule (100 mg total) by mouth as needed. 08/21/23   Ozell Blunt, MD  levothyroxine  (SYNTHROID ) 100 MCG tablet Take 1 tablet (100 mcg total) by mouth daily at 6 (six) AM. 08/22/23   Ozell Blunt, MD  triamcinolone  ointment (KENALOG ) 0.5 % Apply 1 Application topically 2 (two) times daily. as needed to affected area. 09/08/22   Rodney Clamp, MD  valACYclovir  (VALTREX ) 1000 MG tablet Take 1 tablet (1,000 mg total) by mouth daily. Take 1 tablet daily for 5 days as needed  for outbreaks Patient taking differently: Take 1,000 mg by mouth as needed. Take 1 tablet daily for 5 days as needed for outbreaks 03/19/23   Rodney Clamp, MD      Allergies    Verapamil     Review of Systems   Review of Systems  Respiratory:  Positive for shortness of breath.     Physical Exam Updated Vital Signs BP (!) 143/98 (BP Location: Left Arm)   Pulse 78   Temp (!) 97.5 F (36.4 C) (Axillary)   SpO2 98%  Physical Exam Vitals and nursing note reviewed.  Constitutional:      General: She is not in acute distress.    Appearance: She is well-developed. She is not diaphoretic.  HENT:     Head: Normocephalic and atraumatic.  Eyes:     Pupils: Pupils are equal, round, and reactive to light.  Cardiovascular:     Rate and Rhythm: Normal rate and regular rhythm.     Heart sounds: No murmur heard.    No friction rub. No gallop.  Pulmonary:     Effort: Pulmonary effort is normal.     Breath sounds: No wheezing or rales.  Abdominal:     General: There is no distension.     Palpations: Abdomen is soft.     Tenderness:  There is no abdominal tenderness.  Musculoskeletal:        General: No tenderness.     Cervical back: Normal range of motion and neck supple.  Skin:    General: Skin is warm and dry.  Neurological:     Mental Status: She is alert and oriented to person, place, and time.  Psychiatric:        Behavior: Behavior normal.     ED Results / Procedures / Treatments   Labs (all labs ordered are listed, but only abnormal results are displayed) Labs Reviewed  CBC WITH DIFFERENTIAL/PLATELET  COMPREHENSIVE METABOLIC PANEL WITH GFR  BRAIN NATRIURETIC PEPTIDE  TSH  T4, FREE  I-STAT CHEM 8, ED  TROPONIN I (HIGH SENSITIVITY)    EKG EKG Interpretation Date/Time:  Tuesday Sep 04 2023 15:18:17 EDT Ventricular Rate:  99 PR Interval:  152 QRS Duration:  152 QT Interval:  388 QTC Calculation: 496 R Axis:   -36  Text Interpretation: Sinus rhythm Atrial  premature complex Left bundle branch block Confirmed by Angela Kell [phone]) on 09/04/2023 4:12:37 PM  Radiology No results found.  Procedures .Critical Care  Performed by: Albertus Hughs, DO Authorized by: Albertus Hughs, DO   Critical care provider statement:    Critical care time (minutes):  35   Critical care time was exclusive of:  Separately billable procedures and treating other patients   Critical care was time spent personally by me on the following activities:  Development of treatment plan with patient or surrogate, discussions with consultants, evaluation of patient's response to treatment, examination of patient, ordering and review of laboratory studies, ordering and review of radiographic studies, ordering and performing treatments and interventions, pulse oximetry, re-evaluation of patient's condition and review of old charts   Care discussed with: admitting provider       Medications Ordered in ED Medications - No data to display  ED Course/ Medical Decision Making/ A&P                                 Medical Decision Making Amount and/or Complexity of Data Reviewed Labs: ordered. Radiology: ordered.  Risk Decision regarding hospitalization.   89 yo F with a chief complaints of difficulty breathing.  Patient is demented and unable to provide any history.  She is hypoxic on room air here.  Was sent with concern for PE from her PCP.  Will obtain CT imaging.  Blood work.  Reassess.  Patient's family has arrived and states that she had recently developed worsening bilateral lower extremity edema.  She had been placed in wraps with some improvement.  Thought to have some increased work of breathing.  D-dimer was elevated.  She was given a dose of Ativan  yesterday and since then has been quite sleepy. Oxygen saturation is dropping into the 80s.  Usually occurs when the patient has respiratory pauses.  May need supplemental oxygen in the hospitalization.  Patient care was  signed out to Dr. Reba Camper, please see their note for further details given 80.  The patients results and plan were reviewed and discussed.   Any x-rays performed were independently reviewed by myself.   Differential diagnosis were considered with the presenting HPI.  Medications - No data to display  Vitals:   09/04/23 1512  BP: (!) 143/98  Pulse: 78  Temp: (!) 97.5 F (36.4 C)  TempSrc: Axillary  SpO2: 98%    Final diagnoses:  Acute  respiratory failure with hypoxia [facility])    Admission/ observation were discussed with the admitting physician, patient and/or family and they are comfortable with the plan.           Final Clinical Impression(s) / ED Diagnoses Final diagnoses:  Acute respiratory failure with hypoxia [facility])    Rx / DC Orders ED Discharge Orders     None         Albertus Hughs, DO 09/04/23 1614    Albertus Hughs, DO 09/13/23 1722

## 2023-09-05 ENCOUNTER — Inpatient Hospital Stay (HOSPITAL_COMMUNITY)

## 2023-09-05 DIAGNOSIS — I5033 Acute on chronic diastolic (congestive) heart failure: Secondary | ICD-10-CM | POA: Diagnosis not present

## 2023-09-05 DIAGNOSIS — M7989 Other specified soft tissue disorders: Secondary | ICD-10-CM | POA: Diagnosis not present

## 2023-09-05 LAB — ECHOCARDIOGRAM COMPLETE
AR max vel: 1.48 cm2
AV Area VTI: 1.48 cm2
AV Area mean vel: 1.42 cm2
AV Mean grad: 7 mmHg
AV Peak grad: 13.8 mmHg
Ao pk vel: 1.86 m/s
Calc EF: 19.4 %
Est EF: <20
Height: 64 in
MV M vel: 4.72 m/s
MV Peak grad: 89.1 mmHg
MV VTI: 1.12 cm2
P 1/2 time: 359 ms
Radius: 0.7 cm
S' Lateral: 5.3 cm
Single Plane A2C EF: 20 %
Single Plane A4C EF: 13.6 %
Weight: 2052.92 [oz_av]

## 2023-09-05 LAB — CBC
HCT: 39.1 % (ref 36.0–46.0)
Hemoglobin: 12 g/dL (ref 12.0–15.0)
MCH: 28.7 pg (ref 26.0–34.0)
MCHC: 30.7 g/dL (ref 30.0–36.0)
MCV: 93.5 fL (ref 80.0–100.0)
Platelets: 248 10*3/uL (ref 150–400)
RBC: 4.18 MIL/uL (ref 3.87–5.11)
RDW: 15.8 % — ABNORMAL HIGH (ref 11.5–15.5)
WBC: 6.1 10*3/uL (ref 4.0–10.5)
nRBC: 0 % (ref 0.0–0.2)

## 2023-09-05 LAB — PROCALCITONIN: Procalcitonin: <0.1 ng/mL

## 2023-09-05 LAB — BASIC METABOLIC PANEL WITH GFR
Anion gap: 15 (ref 5–15)
BUN: 47 mg/dL — ABNORMAL HIGH (ref 8–23)
CO2: 18 mmol/L — ABNORMAL LOW (ref 22–32)
Calcium: 9.2 mg/dL (ref 8.9–10.3)
Chloride: 104 mmol/L (ref 98–111)
Creatinine, Ser: 1.76 mg/dL — ABNORMAL HIGH (ref 0.44–1.00)
GFR, Estimated: 27 mL/min — ABNORMAL LOW (ref >60)
Glucose, Bld: 133 mg/dL — ABNORMAL HIGH (ref 70–99)
Potassium: 4.5 mmol/L (ref 3.5–5.1)
Sodium: 137 mmol/L (ref 135–145)

## 2023-09-05 LAB — MAGNESIUM: Magnesium: 2.1 mg/dL (ref 1.7–2.4)

## 2023-09-05 MED ORDER — MORPHINE SULFATE (CONCENTRATE) 10 MG /0.5 ML PO SOLN: 10.0000 mg | ORAL | Status: DC | PRN

## 2023-09-05 MED ORDER — PERFLUTREN LIPID MICROSPHERE
1.0000 mL | INTRAVENOUS | Status: AC | PRN
  Administered 2023-09-05: 2 mL via INTRAVENOUS

## 2023-09-05 MED ORDER — CALCIUM CITRATE 950 (200 CA) MG PO TABS
200.0000 mg | ORAL_TABLET | Freq: Two times a day (BID) | ORAL | Status: DC
  Administered 2023-09-05 – 2023-09-08 (×6): 950 mg via ORAL
  Filled 2023-09-05 (×8): qty 1

## 2023-09-05 NOTE — Progress Notes (Signed)
  Echocardiogram 2D Echocardiogram has been performed.  [name] [name] 09/05/2023, 8:56 AM

## 2023-09-05 NOTE — Consult Note (Addendum)
 WOC Nurse Consult Note: patient admitted from [facility] wearing unna boots; unna boots removed by bedside nurse, only wound noted appears to be skin tear to L lower leg  Reason for Consult: unna boots  Wound type: full thickness r/t trauma  Pressure Injury POA: NA  Measurement:see nursing flowsheet  Wound ZOX:WRUE moist  Drainage (amount, consistency, odor) see nursing flowsheet  Periwound: some ecchymosis and scattered dry scabbed areas  Dressing procedure/placement/frequency: Remove unna boots, cleanse legs with soap and water and dry. Cleanse L lower leg wound with NS, apply Xeroform to wound bed and secure with silicone prior to ortho tech reapplying unna boot.    Orders placed for ortho tech to reapply unna boots today after wound care.  Unna boots appear to be utilized for edema management.  POC discussed with bedside nurse. Cornelio Dike, RN assistance with this consult.   WOC team will not follow.  Re-consult if further needs arise.   Thank you,    Ronni Colace MSN, RN-BC, [employer] [phone]

## 2023-09-05 NOTE — Evaluation (Signed)
 Physical Therapy Evaluation Patient Details Name: [name] [name] MRN: [id number] DOB: 1927/07/12 Today's Date: 09/05/2023  History of Present Illness  89 y.o. female admitted 09/04/23 with weakness, hypoxia. Dx of Acute on chronic HFpEF, B pleural effusions, AKI on CKD, postoperative hypothyroidism. Pt with PMH significant for thyroid  cancer, postsurgical hypothyroidism, hypoparathyroidism chronic diastolic CHF, HLD, ORIF L radial fx.  Patient was recently hospitalized 5/5-5/13 from home for severe hypocalcemia secondary to postsurgical hypoparathyroidism.  Clinical Impression  Pt admitted with above diagnosis. Pt difficult to arouse but did eventually open eyes to verbal/tactile stimulation. Pt did not respond to any commands. Total assist for supine to sit, min/mod assist 2* posterior lean to sit at edge of bed. Per son, pt was ambulating with PT at [facility] prior to this admission. She has had a significant decline in mobility. Pt currently with functional limitations due to the deficits listed below (see PT Problem List). Pt will benefit from acute skilled PT to increase their independence and safety with mobility to allow discharge.           If plan is discharge home, recommend the following: Two people to help with walking and/or transfers;A lot of help with bathing/dressing/bathroom;Assistance with cooking/housework;Assist for transportation;Help with stairs or ramp for entrance   Can travel by private vehicle   No    Equipment Recommendations None recommended by PT  Recommendations for Other Services       Functional Status Assessment Patient has had a recent decline in their functional status and demonstrates the ability to make significant improvements in function in a reasonable and predictable amount of time.     Precautions / Restrictions Precautions Precautions: Fall Recall of Precautions/Restrictions: Impaired Precaution/Restrictions Comments: HOH Restrictions Weight  Bearing Restrictions Per Provider Order: No      Mobility  Bed Mobility Overal bed mobility: Needs Assistance Bed Mobility: Supine to Sit, Sit to Supine     Supine to sit: Total assist, HOB elevated Sit to supine: Total assist   General bed mobility comments: pt did not respond to commands, pt 0% for bed mobility; pt sat EOB 5 minutes with min/mod assist for balance 2* posterior lean    Transfers                        Ambulation/Gait                  Stairs            Wheelchair Mobility     Tilt Bed    Modified Rankin (Stroke Patients Only)       Balance Overall balance assessment: Needs assistance Sitting-balance support: Single extremity supported Sitting balance-Leahy Scale: Poor Sitting balance - Comments: posterior lean in sitting requiring min/mod assist and single UE support on bedrail Postural control: Posterior lean                                   Pertinent Vitals/Pain Pain Assessment Pain Assessment: Faces Faces Pain Scale: No hurt    Home Living Family/patient expects to be discharged to:: Skilled nursing facility                 Home Equipment: Rolling Walker (2 wheels) Additional Comments: admitted from [facility] rehab, plan is to return for LTC    Prior Function Prior Level of Function : Needs assist       Physical  Assist : Mobility (physical);ADLs (physical) Mobility (physical): Bed mobility;Transfers;Gait;Stairs   Mobility Comments: was walking with PT at [facility] ADLs Comments: assist needed     Extremity/Trunk Assessment   Upper Extremity Assessment Upper Extremity Assessment: Defer to OT evaluation    Lower Extremity Assessment Lower Extremity Assessment: Difficult to assess due to impaired cognition (pt did not follow commands)    Cervical / Trunk Assessment Cervical / Trunk Assessment: Kyphotic  Communication   Communication Communication: Impaired Factors Affecting  Communication: Hearing impaired    Cognition Arousal: Lethargic Behavior During Therapy: Flat affect   PT - Cognitive impairments: Difficult to assess, Memory, Attention, Initiation, Sequencing Difficult to assess due to: Hard of hearing/deaf                     PT - Cognition Comments: pt lethargic, did not respond to commands, eyes open only 1/2 of session Following commands: Impaired Following commands impaired: Follows one step commands inconsistently     Cueing Cueing Techniques: Verbal cues, Gestural cues, Tactile cues     General Comments      Exercises     Assessment/Plan    PT Assessment Patient needs continued PT services  PT Problem List Decreased strength;Decreased range of motion;Decreased activity tolerance;Decreased balance;Decreased mobility;Decreased knowledge of use of DME       PT Treatment Interventions DME instruction;Gait training;Functional mobility training;Therapeutic activities;Therapeutic exercise;Patient/family education;Balance training    PT Goals (Current goals can be found in the Care Plan section)  Acute Rehab PT Goals Patient Stated Goal: family agreeable to rehab for pt to get stronger PT Goal Formulation: With family Time For Goal Achievement: 09/19/23 Potential to Achieve Goals: Fair    Frequency Min 2X/week     Co-evaluation               AM-PAC PT "6 Clicks" Mobility  Outcome Measure Help needed turning from your back to your side while in a flat bed without using bedrails?: A Lot Help needed moving from lying on your back to sitting on the side of a flat bed without using bedrails?: Total Help needed moving to and from a bed to a chair (including a wheelchair)?: Total Help needed standing up from a chair using your arms (e.g., wheelchair or bedside chair)?: Total Help needed to walk in hospital room?: Total Help needed climbing 3-5 steps with a railing? : Total 6 Click Score: 7    End of Session   Activity  Tolerance: Patient limited by fatigue;Patient limited by lethargy Patient left: in bed;with bed alarm set;with call bell/phone within reach;with family/visitor present Nurse Communication: Mobility status;Need for lift equipment PT Visit Diagnosis: Muscle weakness (generalized) (M62.81);Difficulty in walking, not elsewhere classified (R26.2);Other abnormalities of gait and mobility (R26.89)    Time: 1050-1106 PT Time Calculation (min) (ACUTE ONLY): 16 min   Charges:   PT Evaluation $PT Eval Moderate Complexity: 1 Mod   PT General Charges $$ ACUTE PT VISIT: 1 Visit         Daymon Evans PT 09/05/2023  Acute Rehabilitation Services  Office [phone]

## 2023-09-05 NOTE — Progress Notes (Signed)
 PROGRESS NOTE  [name] [name]  DOB: 08-25-1927  PCP: Rodney Clamp, MD ZOX:[id number]  DOA: 09/04/2023  LOS: 1 day  Hospital Day: 2  Brief narrative: [name] [name] is a 89 y.o. female with PMH significant for thyroid  cancer, postsurgical hypothyroidism, hypoparathyroidism chronic diastolic CHF, HLD. Patient was recently hospitalized 5/5-5/13 from home for severe hypocalcemia secondary to postsurgical hypoparathyroidism.  She was given IV and oral calciums replacement.  PT recommended SNF and hence discharge to short-term rehab at [facility].  Per family, patient's physical strength has not improved significantly and hence they were again process of Medicaid application with a plan of long-term nursing care.  5/27, patient was noted to have shortness of breath and low oxygen saturation at 80%.  EMS started her on supplemental oxygen and brought to the ED.  In the ED, patient was afebrile, hemodynamically stable, required 2 L oxygen by nasal cannula Labs showed CBC unremarkable, BMP with sodium 134, BUN/creatinine 44/1.68, BNP elevated >4500, troponin elevated at 31 Chest x-ray showed cardiomegaly, small to moderate bilateral pleural effusion. Patient was started on broad-spectrum IV antibiotics Given 1 dose of IV Lasix  Admitted to [facility]  Subjective: Patient was seen and examined this morning. Elderly Caucasian female.  Propped up in bed.  Somnolent, opens eyes on command.  Hard of hearing. Son Denman Fischer was at bedside.  Daughter Abe Abed on the phone. History reviewed as above.  At baseline, able to have conversation with family  Chart reviewed In the last 24 hours, no fever, heart rate in 90s, blood pressure in normal range, breathing on 2 L oxygen Labs from this morning with urine/creatinine 47/1.76, serum bicarb low at 18, lactic acid is not repeated this morning, procalcitonin level not elevated  Assessment and plan: Acute on chronic HFpEF  Presented with shortness of breath, hypoxia   BNP significantly elevated Chest x-ray with cardiomegaly, small to moderate bilateral pleural effusion Started on IV Lasix  40 mg twice daily Echocardiogram ordered I do not get any evidence of pneumonia at this time.  No fever.  WBC count normal.  Procalcitonin level not elevated.  She was given empiric dose of antibiotics in the ED.  I do not think antibiotics need to be continued. Continue to monitor for daily intake output, weight, blood pressure, BNP, renal function and electrolytes. Net IO Since Admission: -350 mL [09/05/23 1017] Recent Labs  Lab 09/04/23 1612 09/04/23 1654 09/05/23 0412  BNP >4,500.0*  --   --   BUN 44* 39* 47*  CREATININE 1.68* 1.80* 1.76*  NA 134* 136 137  K 4.6 4.6 4.5  MG  --   --  2.1   Bilateral pleural effusion Small to moderate Will consider thoracentesis if fail to respond to diuresis  Acute respiratory failure with hypoxia O2 sat low in 80s with EMS.   Secondary to CHF exacerbation.   Started on 2 L oxygen by nasal cannula May have underlying atelectasis as well given poor mobility. Wean down as tolerated.  AKI on CKD 3A Acute metabolic acidosis Creatinine 1.09 from 2 weeks ago Presented with creatinine of 1.68 and a bicarb 21.  Watch out while on IV Lasix   Recent Labs    08/13/23 1721 08/14/23 0439 08/16/23 0939 08/17/23 0359 08/18/23 0453 08/19/23 0439 08/20/23 0434 08/21/23 0534 09/04/23 1612 09/04/23 1654 09/05/23 0412  BUN 47* 47* 39* 42* 29* 24* 28* 23 44* 39* 47*  CREATININE 1.85* 1.78* 1.48* 1.65* 1.41* 1.02* 1.38* 1.09* 1.68* 1.80* 1.76*  CO2 21* 20* 22  22 24 24 23 22  21*  --  18*   Postsurgical hypothyroidism  Secondary to thyroidectomy for thyroid  cancer TSH elevated to 17 but free T4 slightly higher than normal range Per prior note, levothyroxine  dose was increased earlier this month.  Continue levothyroxine  at current dose. Continue monitor as an outpatient.  Primary hypoparathyroidism Secondary to  thyroidectomy Recently hospitalized for severe hypocalcemia calcium  level within normal range this hospitalization. Continue calcium  supplements with calcitriol , calcium  citrate Recent Labs  Lab 09/04/23 1612 09/05/23 0412  CALCIUM  9.5 9.2    Impaired mobility Was at home prior to last hospitalization. For the last 2 weeks, she is at short-term rehab at [facility].  Per family, patient's physical strength has not improved significantly and hence they were again process of Medicaid application with a plan of long-term nursing care. PT reevaluation ordered   Goals of care   Code Status: Limited: Do not attempt resuscitation (DNR) -DNR-LIMITED -Do Not Intubate/DNI      DVT prophylaxis:  heparin injection 5,000 Units Start: 09/04/23 2200   Antimicrobials: None currently Fluid: None Consultants: None Family Communication: Son at bedside, daughter on the phone  Status: Inpatient Level of care:  Progressive   Patient is from: SNF Needs to continue in-hospital care: Needs IV Lasix , Anticipated d/c to: Hopefully back to SNF in 1 to 2 days    Diet:  Diet Order             Diet Heart Room service appropriate? Yes; Fluid consistency: Thin  Diet effective now                   Scheduled Meds:  calcitRIOL   0.5 mcg Oral Daily   calcium  citrate  200 mg of elemental calcium  Oral BID   furosemide   40 mg Intravenous Q12H   heparin  5,000 Units Subcutaneous Q8H   levothyroxine   100 mcg Oral Q0600   sodium chloride  flush  3 mL Intravenous Q12H    PRN meds: acetaminophen  **OR** acetaminophen , ipratropium-albuterol , LORazepam, perflutren lipid microspheres (DEFINITY) IV suspension, prochlorperazine, senna-docusate   Infusions:    Antimicrobials: Anti-infectives (From admission, onward)    Start     Dose/Rate Route Frequency Ordered Stop   09/04/23 1930  ceFEPIme (MAXIPIME) 2 g in sodium chloride  0.9 % 100 mL IVPB        2 g 200 mL/hr over 30 Minutes Intravenous  Once  09/04/23 1920 09/04/23 2027   09/04/23 1930  Vancomycin  (VANCOCIN ) 1,250 mg in sodium chloride  0.9 % 250 mL IVPB        1,250 mg 166.7 mL/hr over 90 Minutes Intravenous  Once 09/04/23 1920 09/04/23 2159       Objective: Vitals:   09/05/23 0429 09/05/23 0442  BP: (!) 135/105 (!) (P) 125/99  Pulse: 96   Resp: 16   Temp: 98.5 F (36.9 C)   SpO2: 100%     Intake/Output Summary (Last 24 hours) at 09/05/2023 1017 Last data filed at 09/05/2023 0928 Gross per 24 hour  Intake 300 ml  Output 650 ml  Net -350 ml   Filed Weights   09/05/23 0121  Weight: 58.2 kg   Weight change:  Body mass index is 22.02 kg/m.   Physical Exam: General exam: Pleasant, elderly Caucasian female.  Not in pain Skin: No rashes, lesions or ulcers. HEENT: Atraumatic, normocephalic, no obvious bleeding Lungs: Diminished air entry in both bases, otherwise clear to auscultation CVS: S1, S2, no murmur,   GI/Abd: Soft, nontender, nondistended, bowel  sound present,   CNS: Somnolent, opens eyes to command, hard of hearing.  Able to verbalize few words for me Psychiatry: Mood appropriate Extremities: Trace to 1+ bilateral pedal edema, no calf tenderness,   Data Review: I have personally reviewed the laboratory data and studies available.  F/u labs ordered Unresulted Labs (From admission, onward)     Start     Ordered   09/05/23 0500  Basic metabolic panel  Daily,   R      09/04/23 1935   09/05/23 0500  CBC  Daily,   R      09/04/23 1935            Signed, Hoyt Macleod, MD [facility] 09/05/2023

## 2023-09-05 NOTE — Progress Notes (Signed)
 Heart Failure Navigator Progress Note  Assessed for Heart & Vascular TOC clinic readiness.  Patient does not meet criteria due to per MD note patient with history of Dementia. No HF TOC. .   Navigator will sign off at this time.   Randie Bustle, BSN, Scientist, clinical (histocompatibility and immunogenetics) Only

## 2023-09-05 NOTE — Progress Notes (Signed)
 Bilateral lower extremity venous duplex has been completed. Preliminary results can be found in CV Proc through chart review.   09/05/23 10:41 AM Birda Buffy RVT

## 2023-09-05 NOTE — TOC Initial Note (Signed)
 Transition of Care [facility]) - Initial/Assessment Note    Patient Details  Name: [name] [name] MRN: [id number] Date of Birth: July 07, 1927  Transition of Care [facility]) CM/SW Contact:    Gertha Ku, LCSW Phone Number: 09/05/2023, 3:34 PM  Clinical Narrative:                  Pt is a Short term rehab resident from [facility]. CSW spoke to [place] with admission, pt can return with approved insurance authorization. TOC to follow.   Expected Discharge Plan:  (TBD) Barriers to Discharge: Continued Medical Work up   Patient Goals and CMS Choice            Expected Discharge Plan and Services                                              Prior Living Arrangements/Services                       Activities of Daily Living   ADL Screening (condition at time of admission) Independently performs ADLs?: No Does the patient have a NEW difficulty with bathing/dressing/toileting/self-feeding that is expected to last >3 days?: No Does the patient have a NEW difficulty with getting in/out of bed, walking, or climbing stairs that is expected to last >3 days?: No Does the patient have a NEW difficulty with communication that is expected to last >3 days?: No Is the patient deaf or have difficulty hearing?: Yes Does the patient have difficulty seeing, even when wearing glasses/contacts?: Yes Does the patient have difficulty concentrating, remembering, or making decisions?: Yes  Permission Sought/Granted                  Emotional Assessment              Admission diagnosis:  Acute respiratory failure with hypoxia (HCC) [J96.01] AKI (acute kidney injury) (HCC) [N17.9] Acute on chronic diastolic CHF (congestive heart failure) (HCC) [I50.33] Patient Active Problem List   Diagnosis Date Noted   Acute on chronic diastolic CHF (congestive heart failure) (HCC) 09/04/2023   Pleural effusion due to CHF (congestive heart failure) (HCC) 09/04/2023   Hypocalcemia  08/13/2023   Insomnia 06/05/2023   Cognitive impairment 06/05/2023   Osteoarthritis 01/17/2022   Pulmonary nodule 11/14/2021   Pulmonary embolism (HCC) 10/17/2021   Essential hypertension 01/11/2021   Leg edema 12/01/2020   Plantar flexed metatarsal bone of left foot 11/24/2020   Plantar flexed metatarsal bone of right foot 11/24/2020   Chronic sinusitis 07/17/2019   Right leg weakness 10/15/2018   Corns and callus 09/25/2018   Shingles 04/05/2018   HLD (hyperlipidemia) 08/05/2017   Acute renal failure superimposed on stage 3a chronic kidney disease (HCC) 08/05/2017   Neuropathy 11/19/2014   Malignant neoplasm of thyroid  gland (HCC) 03/28/2007   HYPOTHYROIDISM, POSTSURGICAL 03/28/2007   HYPOPARATHYROIDISM 03/28/2007   Allergic rhinitis 11/02/2006   Disorder of bone and cartilage 11/02/2006   PCP:  Rodney Clamp, MD Pharmacy:  No Pharmacies Listed    Social Drivers of Health (SDOH) Social History: SDOH Screenings   Food Insecurity: No Food Insecurity (09/04/2023)  Housing: Low Risk  (09/04/2023)  Transportation Needs: No Transportation Needs (09/04/2023)  Utilities: Not At Risk (09/04/2023)  Depression (PHQ2-9): Low Risk  (06/05/2023)  Social Connections: Unknown (09/05/2023)  Recent Concern: Social Connections - Moderately  Isolated (08/13/2023)  Tobacco Use: Low Risk  (09/04/2023)   SDOH Interventions:     Readmission Risk Interventions    08/21/2023   12:14 PM 10/19/2021    4:01 PM  Readmission Risk Prevention Plan  Post Dischage Appt Complete   Medication Screening Complete   Transportation Screening Complete Complete  PCP or Specialist Appt within 3-5 Days  Complete  HRI or Home Care Consult  Complete  Social Work Consult for Recovery Care Planning/Counseling  Complete  Palliative Care Screening  Not Complete  Medication Review Oceanographer)  Complete

## 2023-09-05 NOTE — Plan of Care (Signed)
  Problem: Safety: Goal: Ability to remain free from injury will improve Outcome: Progressing   Problem: Education: Goal: Ability to demonstrate management of disease process will improve Outcome: Progressing

## 2023-09-05 NOTE — Consult Note (Signed)
 Palliative Care Consultation Note  Ms. Senseney is a 89 year old woman with history of thyroid  malignancy status post total thyroidectomy and parathyroidectomy, stage III chronic kidney disease, congestive heart failure and hyperlipidemia who was readmitted to the hospital from her skilled nursing facility with acute respiratory failure, peripheral edema and worsening mental status.  On her previous hospitalization she had findings consistent with medication nonadherence with thyroid  medication and calcium  supplementation, however she returns in acute congestive heart failure with hypoxemia and volume overload.  Overall she has had a progressive decline over the past month, wearing assistance with her activities of daily living and managing her medical problems.  She is continue to decline during this hospitalization eluding worsening renal function with diuresis.  Given her advanced age, low level functional status and frailty, palliative care was consulted to discuss goals of care with family give consideration for transition to comfort care.  I met with patient, as well as her brother and sister and son-in-law, who are her primary caregivers.  The family is clear that the patient expressed a desire to not have her life prolonged in a state of debility and suffering.  She has told them that she does not want aggressive medical interventions and prefers to allow for a natural death to occur.  Based on the patient's previously stated goals of care I recommended a comfort care transition to the family.  Prognostically she likely has days to less than 2 weeks.  This prognostication is supported by a 2D echocardiogram that was obtained today and shows severe global hypokinesis with an ejection fraction of less than 20%. Decreased LOC and PO intake.  The patient is difficult to arouse and unable to participate with any level of physical therapy or exertion without becoming severely short of breath.  Her  appetite is significantly just creased and with her current LOC she is a high aspiration risk.  I reviewed her weight over the past 6 months and she has a minimum of 6 to 10 pounds of fluid related weight gain.  She is requiring bilateral Unna boots to manage wounds of her lower extremities from venous stasis and peripheral edema.  Echocardiogram:  1. Severe global hypokinesis with apical akineiss. Left ventricular  ejection fraction, by estimation, is <20%. Left ventricular ejection  fraction by 2D MOD biplane is 19.4 %. The left ventricle has severely  decreased function. The left ventricle  demonstrates global hypokinesis. There is mild concentric left ventricular  hypertrophy. Left ventricular diastolic parameters are consistent with  Grade II diastolic dysfunction (pseudonormalization).   2. Right ventricular systolic function is mildly reduced. The right  ventricular size is normal. There is mildly elevated pulmonary artery  systolic pressure. The estimated right ventricular systolic pressure is  44.8 mmHg.   3. Left atrial size was severely dilated.   4. Right atrial size was mildly dilated.   5. Large pleural effusion in the left lateral region.   6. The mitral valve is normal in structure. Moderate to severe mitral  valve regurgitation. No evidence of mitral stenosis. Moderate mitral  annular calcification.   7. Tricuspid valve regurgitation is moderate.   8. There is minimal excursion of the right coronary cusp. The left and  non-coronary cusps are nearly fused. Aortic valve gradient and velocity  likely underestimate the severeity of aortic stenosis due to low flow.  Dimensionless index suggests at least  moderate aortic stenosis. The aortic valve is calcified. There is moderate  calcification of the aortic valve. There is  moderate thickening of the  aortic valve. Aortic valve regurgitation is mild to moderate. Aortic valve  sclerosis/calcification is  present, without any  evidence of aortic stenosis. Aortic valve area, by  VTI measures 1.48 cm. Aortic valve mean gradient measures 7.0 mmHg.  Aortic valve Vmax measures 1.86 m/s.   9. The inferior vena cava is dilated in size with <50% respiratory  variability, suggesting right atrial pressure of 15 mmHg.   Comparison(s): The left ventricular function is significantly worse.    PPS: 30%, at time of last discharge on 5/13 her PPS was 50%.  Assessment: 89 yo with severe end-stage heart failure EF<20%, hypoxemia and volume overload and worsening renal function. She is not a candidate for advanced therapies or interventions due to baseline poor functional status, advanced age and comorbid disease.  Recommendations: Transition to full comfort measures.  Maintain her pre-existing DO NOT RESUSCITATE order. In terms of her management, she may continue her thyroid  and calcium  supplementations as long as she is able to swallow pills however this is optional and may prolong her life in her current state I would support discontinuing these and allowing for natural death to occur. She is agitated by her nasal cannula oxygen, so I recommended this be removed if she develops any shortness of breath or dyspnea I have ordered Roxanol 10 mg to be given sublingually every 2 hours as needed. I anticipate she will have an escalation in her symptom management needs over the next 24 to 48 hours I am recommending that she be moved to a hospice IPU for end-of-life care.  Place a referral for IPU and await eligibility.  Family is requesting I speak with patient's granddaughter later this afternoon.  Flora Humphreys, DO Palliative Medicine   Time: 70 minutes

## 2023-09-06 DIAGNOSIS — I5033 Acute on chronic diastolic (congestive) heart failure: Secondary | ICD-10-CM | POA: Diagnosis not present

## 2023-09-06 MED ORDER — MORPHINE SULFATE (PF) 2 MG/ML IV SOLN: 1.0000 mg | INTRAVENOUS | Status: DC | PRN

## 2023-09-06 MED ORDER — MORPHINE SULFATE (CONCENTRATE) 10 MG /0.5 ML PO SOLN: 5.0000 mg | ORAL | Status: DC | PRN

## 2023-09-06 NOTE — Progress Notes (Signed)
 PROGRESS NOTE  [name] [name]  DOB: 12-28-1927  PCP: Rodney Clamp, MD ZOX:[id number]  DOA: 09/04/2023  LOS: 2 days  Hospital Day: 3  Brief narrative: [name] [name] is a 89 y.o. female with PMH significant for thyroid  cancer, postsurgical hypothyroidism, hypoparathyroidism chronic diastolic CHF, HLD. Patient was recently hospitalized 5/5-5/13 from home for severe hypocalcemia secondary to postsurgical hypoparathyroidism.  She was given IV and oral calciums replacement.  PT recommended SNF and hence discharge to short-term rehab at [facility].  Per family, patient's physical strength has not improved significantly and hence they were again process of Medicaid application with a plan of long-term nursing care.  5/27, patient was noted to have shortness of breath and low oxygen saturation at 80%.  EMS started her on supplemental oxygen and brought to the ED.  In the ED, patient was afebrile, hemodynamically stable, required 2 L oxygen by nasal cannula Labs showed CBC unremarkable, BMP with sodium 134, BUN/creatinine 44/1.68, BNP elevated >4500, troponin elevated at 31 Chest x-ray showed cardiomegaly, small to moderate bilateral pleural effusion. Patient was started on broad-spectrum IV antibiotics Given 1 dose of IV Lasix  Admitted to [facility] Echo showed EF less than 20%. Palliative care consultation was obtained. Family made a choice to make her comfort care/hospice.  Subjective: Patient was seen and examined this morning. Somnolent.  Opens eyes on command.  Weak. Daughter and son at bedside. Family is chosen [facility].  Social worker aware  Assessment and plan: Comfort care status Initially admitted for acute exacerbation of CHF EF less than 20%. Palliative care consultation obtained.  Family chose comfort care route Cardiac meds stopped. Comfort care order set utilized Looking for residential hospice placement at [facility]  Postsurgical hypothyroidism and PAD  hypothyroidism Will allow patient to continue oral thyroxine and calcium  supplements if able to take oral.  Other acute issues CHF, acute respiratory with hypoxia, AKI, CKD   Goals of care   Code Status: Do not attempt resuscitation (DNR) - Comfort care   Consultants: Palliative care Family Communication: Son at bedside, daughter on the phone  Status: Inpatient Patient is from: SNF Anticipated d/c to: [facility]    Diet:  Diet Order             Diet Heart Room service appropriate? Yes; Fluid consistency: Thin  Diet effective now                   Scheduled Meds:  calcitRIOL   0.5 mcg Oral Daily   calcium  citrate  200 mg of elemental calcium  Oral BID   levothyroxine   100 mcg Oral Q0600   sodium chloride  flush  3 mL Intravenous Q12H    PRN meds: acetaminophen  **OR** acetaminophen , ipratropium-albuterol , LORazepam, morphine  injection, morphine  CONCENTRATE, morphine  CONCENTRATE **OR** morphine  CONCENTRATE, prochlorperazine, senna-docusate   Infusions:    Antimicrobials: Anti-infectives (From admission, onward)    Start     Dose/Rate Route Frequency Ordered Stop   09/04/23 1930  ceFEPIme (MAXIPIME) 2 g in sodium chloride  0.9 % 100 mL IVPB        2 g 200 mL/hr over 30 Minutes Intravenous  Once 09/04/23 1920 09/04/23 2027   09/04/23 1930  Vancomycin  (VANCOCIN ) 1,250 mg in sodium chloride  0.9 % 250 mL IVPB        1,250 mg 166.7 mL/hr over 90 Minutes Intravenous  Once 09/04/23 1920 09/04/23 2159       Objective: Vitals:   09/05/23 1950 09/06/23 0337  BP: (!) 134/100 132/87  Pulse: 62 70  Resp: 20 19  Temp: 97.8 F (36.6 C) 97.7 F (36.5 C)  SpO2: 100% 100%    Intake/Output Summary (Last 24 hours) at 09/06/2023 1511 Last data filed at 09/06/2023 0600 Gross per 24 hour  Intake --  Output 1800 ml  Net -1800 ml   Filed Weights   09/05/23 0121  Weight: 58.2 kg   Weight change:  Body mass index is 22.02 kg/m.   Physical Exam: General exam:  Pleasant, elderly Caucasian female.  Comfortable I did not do a detailed examination because of comfort care status  Data Review: I have personally reviewed the laboratory data and studies available.  F/u labs ordered Unresulted Labs (From admission, onward)    None       Signed, Hoyt Macleod, MD [facility] 09/06/2023

## 2023-09-06 NOTE — Progress Notes (Signed)
 [id number] [employer]  Hospice hospital liaison note   Referral received from [facility] for family interest in [facility]. Eligibility confirmed. Met with patient and family to confirm interest and explain services.   Unfortunately [facility] is unable to offer a bed today.   TOC aware that liaison will follow up tomorrow or sooner if a bed becomes available.    Thank you for the opportunity to participate in this patient's care Ardine Beckwith, LPN Hospice nurse liaison [phone]

## 2023-09-06 NOTE — TOC Progression Note (Signed)
 Transition of Care [facility]) - Progression Note    Patient Details  Name: [name] [name] MRN: [id number] Date of Birth: 05-Dec-1927  [facility]) CM/SW Contact  Gertha Ku, LCSW Phone Number: 09/06/2023, 8:41 AM  Clinical Narrative:    CSW spoke with pt's daughter to confirm hospice choice. Referral for residential hospice  sent to [facility] hospice liaison with [facility]. TOC to follow.   Expected Discharge Plan: Hospice Medical Facility Barriers to Discharge: Continued Medical Work up  Expected Discharge Plan and Services                                               Social Determinants of Health (SDOH) Interventions SDOH Screenings   Food Insecurity: No Food Insecurity (09/04/2023)  Housing: Low Risk  (09/04/2023)  Transportation Needs: No Transportation Needs (09/04/2023)  Utilities: Not At Risk (09/04/2023)  Depression (PHQ2-9): Low Risk  (06/05/2023)  Social Connections: Unknown (09/05/2023)  Recent Concern: Social Connections - Moderately Isolated (08/13/2023)  Tobacco Use: Low Risk  (09/04/2023)    Readmission Risk Interventions    08/21/2023   12:14 PM 10/19/2021    4:01 PM  Readmission Risk Prevention Plan  Post Dischage Appt Complete   Medication Screening Complete   Transportation Screening Complete Complete  PCP or Specialist Appt within 3-5 Days  Complete  HRI or Home Care Consult  Complete  Social Work Consult for Recovery Care Planning/Counseling  Complete  Palliative Care Screening  Not Complete  Medication Review Oceanographer)  Complete

## 2023-09-07 DIAGNOSIS — I5033 Acute on chronic diastolic (congestive) heart failure: Secondary | ICD-10-CM | POA: Diagnosis not present

## 2023-09-07 MED ORDER — GLYCOPYRROLATE 1 MG PO TABS
1.0000 mg | ORAL_TABLET | Freq: Two times a day (BID) | ORAL | Status: DC
  Administered 2023-09-07 – 2023-09-08 (×2): 1 mg via ORAL
  Filled 2023-09-07 (×4): qty 1

## 2023-09-07 NOTE — Progress Notes (Signed)
 Assuming care for patient from off-going RN. Agree w/ previously charted shift assessment/daily documentation.

## 2023-09-07 NOTE — Progress Notes (Signed)
   09/07/23 1416  Spiritual Encounters  Type of Visit Initial  Care provided to: Pt and family  Referral source Chaplain assessment  Spiritual Framework  Presenting Themes Meaning/purpose/sources of inspiration  Values/beliefs belief in G*d  Community/Connection Family   In response to comfort care status, I visited with [name] [name] and her adult children at bedside.  [name] does not hear well, but hears best in left ear. She articulated to me that her belief in G*d is a source of comfort to her. She was aweare of her family support and presence. She also named three sisters who have passed on before and were present in her thoughts. She expressed no discomfort.  I engaged family and offered compassionate presence and active listening. I encouraged staory sharing and reflection. I provided prayer at family request. They know how to reach a chaplain through care team if further support needed.  [name] L. Minetta Aly, M.Div [phone]

## 2023-09-07 NOTE — Progress Notes (Signed)
 PROGRESS NOTE  [name] [name]  DOB: [birth year]  PCP: Rodney Clamp, MD ZOX:[id number]  DOA: 09/04/2023  LOS: 3 days  Hospital Day: 4  Brief narrative: [name] [name] is a 89 y.o. female with PMH significant for thyroid  cancer, postsurgical hypothyroidism, hypoparathyroidism chronic diastolic CHF, HLD. Patient was recently hospitalized 5/5-5/13 from home for severe hypocalcemia secondary to postsurgical hypoparathyroidism.  She was given IV and oral calciums replacement.  PT recommended SNF and hence discharge to short-term rehab at [facility].  Per family, patient's physical strength has not improved significantly and hence they were again process of Medicaid application with a plan of long-term nursing care.  5/27, patient was noted to have shortness of breath and low oxygen saturation at 80%.  EMS started her on supplemental oxygen and brought to the ED.  In the ED, patient was afebrile, hemodynamically stable, required 2 L oxygen by nasal cannula Labs showed CBC unremarkable, BMP with sodium 134, BUN/creatinine 44/1.68, BNP elevated >4500, troponin elevated at 31 Chest x-ray showed cardiomegaly, small to moderate bilateral pleural effusion. Patient was started on broad-spectrum IV antibiotics Given 1 dose of IV Lasix  Admitted to [facility] Echo showed EF less than 20%. Palliative care consultation was obtained. Family made a choice to make her comfort care/hospice.  Subjective: Patient was seen and examined this morning. Awake, alert, able to do some talking. Family at bedside and pleasant with her improvement. Pending variability at [facility] for residential hospice  Assessment and plan: Comfort care status Initially admitted for acute exacerbation of CHF EF less than 20%. Palliative care consultation obtained.  Family chose comfort care route Cardiac meds stopped. Comfort care order set utilized Remains comfortable this morning Looking for residential hospice placement at [facility]  Postsurgical hypothyroidism and PAD hypothyroidism Will allow patient to continue oral thyroxine and calcium  supplements if able to take oral.  Other acute issues CHF, acute respiratory with hypoxia, AKI, CKD   Goals of care   Code Status: Do not attempt resuscitation (DNR) - Comfort care   Consultants: Palliative care Family Communication: Son at bedside, daughter on the phone  Status: Inpatient Patient is from: SNF Anticipated d/c to: [facility]    Diet:  Diet Order             Diet Heart Room service appropriate? Yes; Fluid consistency: Thin  Diet effective now                   Scheduled Meds:  calcitRIOL   0.5 mcg Oral Daily   calcium  citrate  200 mg of elemental calcium  Oral BID   glycopyrrolate   1 mg Oral BID   levothyroxine   100 mcg Oral Q0600   sodium chloride  flush  3 mL Intravenous Q12H    PRN meds: acetaminophen  **OR** acetaminophen , ipratropium-albuterol , LORazepam , morphine  injection, morphine  CONCENTRATE, morphine  CONCENTRATE **OR** morphine  CONCENTRATE, prochlorperazine , senna-docusate   Infusions:    Antimicrobials: Anti-infectives (From admission, onward)    Start     Dose/Rate Route Frequency Ordered Stop   09/04/23 1930  ceFEPIme  (MAXIPIME ) 2 g in sodium chloride  0.9 % 100 mL IVPB        2 g 200 mL/hr over 30 Minutes Intravenous  Once 09/04/23 1920 09/04/23 2027   09/04/23 1930  Vancomycin  (VANCOCIN ) 1,250 mg in sodium chloride  0.9 % 250 mL IVPB        1,250 mg 166.7 mL/hr over 90 Minutes Intravenous  Once 09/04/23 1920 09/04/23 2159       Objective:  Vitals:   09/06/23 1500 09/06/23 2012  BP: 132/69 119/74  Pulse: 67 63  Resp: 18 15  Temp: 97.6 F (36.4 C) 97.9 F (36.6 C)  SpO2: 100% 100%    Intake/Output Summary (Last 24 hours) at 09/07/2023 1114 Last data filed at 09/07/2023 0900 Gross per 24 hour  Intake 360 ml  Output 700 ml  Net -340 ml   Filed Weights   09/05/23 0121  Weight: 58.2 kg   Weight change:   Body mass index is 22.02 kg/m.   Physical Exam: General exam: Pleasant, elderly Caucasian female.  Alert, awake, hard of hearing but talking.  Comfortable I did not do a detailed examination because of comfort care status  Data Review: I have personally reviewed the laboratory data and studies available.  F/u labs ordered Unresulted Labs (From admission, onward)    None       Signed, Hoyt Macleod, MD [facility] 09/07/2023

## 2023-09-07 NOTE — TOC Progression Note (Signed)
 Transition of Care [facility]) - Progression Note    Patient Details  Name: [name] [name] MRN: [id number] Date of Birth: 10-Mar-1928  [facility] Care [facility]) CM/SW Contact  Gertha Ku, LCSW Phone Number: 09/07/2023, 3:18 PM  Clinical Narrative:     CSW spoke with the pt's daughter and son to discuss recommendations for possible LTC with hospice, should the pt continue to improve. CSW explained that [facility] reported the pt would either need to private pay or obtain insurance authorization from rehab in order to return to the facility, as the pt does not currently have Medicaid in place. The pt's daughter stated that neither she nor her mother has the financial means to private pay for LTC. She also noted that she initiated the pt's Medicaid application last Wednesday. CSW explained that Medicaid processing may take approximately 30 to 45 days to receive a determination, whether approved or denied. CSW will continue to follow the pt, as she is still under review for residential hospice placement.  Expected Discharge Plan: Hospice Medical Facility Barriers to Discharge: Continued Medical Work up  Expected Discharge Plan and Services                                               Social Determinants of Health (SDOH) Interventions SDOH Screenings   Food Insecurity: No Food Insecurity (09/04/2023)  Housing: Low Risk  (09/04/2023)  Transportation Needs: No Transportation Needs (09/04/2023)  Utilities: Not At Risk (09/04/2023)  Depression (PHQ2-9): Low Risk  (06/05/2023)  Social Connections: Unknown (09/05/2023)  Recent Concern: Social Connections - Moderately Isolated (08/13/2023)  Tobacco Use: Low Risk  (09/04/2023)    Readmission Risk Interventions    08/21/2023   12:14 PM 10/19/2021    4:01 PM  Readmission Risk Prevention Plan  Post Dischage Appt Complete   Medication Screening Complete   Transportation Screening Complete Complete  PCP or Specialist Appt within 3-5  Days  Complete  HRI or Home Care Consult  Complete  Social Work Consult for Recovery Care Planning/Counseling  Complete  Palliative Care Screening  Not Complete  Medication Review Oceanographer)  Complete

## 2023-09-07 NOTE — Plan of Care (Signed)

## 2023-09-08 DIAGNOSIS — I5033 Acute on chronic diastolic (congestive) heart failure: Secondary | ICD-10-CM | POA: Diagnosis not present

## 2023-09-08 MED ORDER — MORPHINE SULFATE (CONCENTRATE) 10 MG /0.5 ML PO SOLN: 10.0000 mg | ORAL | Status: DC | PRN

## 2023-09-08 MED ORDER — MORPHINE SULFATE (CONCENTRATE) 10 MG /0.5 ML PO SOLN
5.0000 mg | ORAL | Status: DC | PRN
Start: 2023-09-08 — End: 2023-11-02

## 2023-09-08 MED ORDER — MORPHINE SULFATE (CONCENTRATE) 10 MG /0.5 ML PO SOLN: 5.0000 mg | ORAL | Status: DC | PRN

## 2023-09-08 MED ORDER — GLYCOPYRROLATE 1 MG PO TABS: 1.0000 mg | ORAL_TABLET | Freq: Two times a day (BID) | ORAL | Status: DC

## 2023-09-08 MED ORDER — ACETAMINOPHEN 325 MG PO TABS: 650.0000 mg | ORAL_TABLET | Freq: Four times a day (QID) | ORAL | Status: DC | PRN

## 2023-09-08 NOTE — Progress Notes (Addendum)
 Called receiving facility RN - Hailey at [facility] in [place]. Removed PIV per request prior to transport. All questions/concerns addressed. Transport has been called/arranged by [facility] Nurse

## 2023-09-08 NOTE — TOC Transition Note (Signed)
 Transition of Care [facility]) - Discharge Note   Patient Details  Name: [name] [name] MRN: [id number] Date of Birth: 11-01-1927  Transition of Care [facility]) CM/SW Contact:  Amaryllis Junior, LCSW Phone Number: 09/08/2023, 2:59 PM   Clinical Narrative:    Pt dc to [facility]. [facility] called at 3:05pm. DC packet with DNR and signed scripts left at nurses station. Call report [phone]. No further TOC needs.   Final next level of care: Hospice Medical Facility Barriers to Discharge: Barriers Resolved   Patient Goals and CMS Choice     Choice offered to / list presented to : NA      Discharge Placement                Patient to be transferred to facility by: PTAR   Patient and family notified of of transfer: 09/08/23  Discharge Plan and Services Additional resources added to the After Visit Summary for                  DME Arranged: N/A DME Agency: NA       HH Arranged: NA HH Agency: NA        Social Drivers of Health (SDOH) Interventions SDOH Screenings   Food Insecurity: No Food Insecurity (09/04/2023)  Housing: Low Risk  (09/04/2023)  Transportation Needs: No Transportation Needs (09/04/2023)  Utilities: Not At Risk (09/04/2023)  Depression (PHQ2-9): Low Risk  (06/05/2023)  Social Connections: Unknown (09/05/2023)  Recent Concern: Social Connections - Moderately Isolated (08/13/2023)  Tobacco Use: Low Risk  (09/04/2023)     Readmission Risk Interventions    08/21/2023   12:14 PM 10/19/2021    4:01 PM  Readmission Risk Prevention Plan  Post Dischage Appt Complete   Medication Screening Complete   Transportation Screening Complete Complete  PCP or Specialist Appt within 3-5 Days  Complete  HRI or Home Care Consult  Complete  Social Work Consult for Recovery Care Planning/Counseling  Complete  Palliative Care Screening  Not Complete  Medication Review [employer])  Complete

## 2023-09-08 NOTE — Plan of Care (Signed)
  Problem: Clinical Measurements: Goal: Ability to maintain clinical measurements within normal limits will improve Outcome: Progressing Goal: Diagnostic test results will improve Outcome: Progressing Goal: Respiratory complications will improve Outcome: Progressing   Problem: Nutrition: Goal: Adequate nutrition will be maintained Outcome: Progressing   Problem: Coping: Goal: Level of anxiety will decrease Outcome: Progressing   Problem: Pain Managment: Goal: General experience of comfort will improve and/or be controlled Outcome: Progressing   Problem: Safety: Goal: Ability to remain free from injury will improve Outcome: Progressing   Problem: Pain Management: Goal: Satisfaction with pain management regimen will improve Outcome: Progressing

## 2023-09-08 NOTE — Progress Notes (Signed)
 PROGRESS NOTE  [name] [name]  DOB: [birth year]  PCP: Rodney Clamp, MD WUJ:[id number]  DOA: 09/04/2023  LOS: 4 days  Hospital Day: 5  Brief narrative: [name] [name] is a 89 y.o. female with PMH significant for thyroid  cancer, postsurgical hypothyroidism, hypoparathyroidism chronic diastolic CHF, HLD. Patient was recently hospitalized 5/5-5/13 from home for severe hypocalcemia secondary to postsurgical hypoparathyroidism.  She was given IV and oral calciums replacement.  PT recommended SNF and hence discharge to short-term rehab at [facility].  Per family, patient's physical strength has not improved significantly and hence they were again process of Medicaid application with a plan of long-term nursing care.  5/27, patient was noted to have shortness of breath and low oxygen saturation at 80%.  EMS started her on supplemental oxygen and brought to the ED.  In the ED, patient was afebrile, hemodynamically stable, required 2 L oxygen by nasal cannula Labs showed CBC unremarkable, BMP with sodium 134, BUN/creatinine 44/1.68, BNP elevated >4500, troponin elevated at 31 Chest x-ray showed cardiomegaly, small to moderate bilateral pleural effusion. Patient was started on broad-spectrum IV antibiotics Given 1 dose of IV Lasix  Admitted to [facility] Echo showed EF less than 20%. Palliative care consultation was obtained. Family made a choice to make her comfort care/hospice.  Subjective: Patient was seen and examined this morning. Propped up in bed.  Alert, awake, ate her breakfast. Multiple family members at bedside. It seems, patient at this time is not meeting criteria for residential hospice.  Not strong enough for short-term rehab.  She has Medicaid application pending for long-term placement.  Disposition difficult  Assessment and plan: Comfort care status Initially admitted for acute exacerbation of CHF EF less than 20%. Palliative care consultation obtained.  Family chose comfort care  route Cardiac meds stopped. Comfort care order set utilized Remains alert, awake comfortable this morning Initial plan was to discharge to residential hospice placement at [facility].  However with some improvement in her clinical status, it seems that his not likely at this time.  Postsurgical hypothyroidism and PAD hypothyroidism Okay to allow patient to continue oral thyroxine and calcium  supplements if able to take oral.  Other acute issues CHF, acute respiratory with hypoxia, AKI, CKD   Goals of care   Code Status: Do not attempt resuscitation (DNR) - Comfort care   Consultants: Palliative care Family Communication: Son, daughter and multiple other family members at bedside  Status: Inpatient Patient is from: SNF Anticipated d/c to: Disposition in limbo. It seems, patient at this time is not meeting criteria for residential hospice.  Not strong enough for short-term rehab.  She has Medicaid application pending for long-term placement.  Disposition difficult   Diet:  Diet Order             Diet Heart Room service appropriate? Yes; Fluid consistency: Thin  Diet effective now                   Scheduled Meds:  calcitRIOL   0.5 mcg Oral Daily   calcium  citrate  200 mg of elemental calcium  Oral BID   glycopyrrolate   1 mg Oral BID   levothyroxine   100 mcg Oral Q0600   sodium chloride  flush  3 mL Intravenous Q12H    PRN meds: acetaminophen  **OR** acetaminophen , ipratropium-albuterol , LORazepam , morphine  injection, morphine  CONCENTRATE, morphine  CONCENTRATE **OR** morphine  CONCENTRATE, prochlorperazine , senna-docusate   Infusions:    Antimicrobials: Anti-infectives (From admission, onward)    Start     Dose/Rate Route Frequency Ordered Stop  09/04/23 1930  ceFEPIme  (MAXIPIME ) 2 g in sodium chloride  0.9 % 100 mL IVPB        2 g 200 mL/hr over 30 Minutes Intravenous  Once 09/04/23 1920 09/04/23 2027   09/04/23 1930  Vancomycin  (VANCOCIN ) 1,250 mg in sodium  chloride 0.9 % 250 mL IVPB        1,250 mg 166.7 mL/hr over 90 Minutes Intravenous  Once 09/04/23 1920 09/04/23 2159       Objective: Vitals:   09/07/23 1514 09/08/23 0400  BP: (!) 125/91 121/84  Pulse: 66 (!) 54  Resp:  20  Temp: (!) 97.1 F (36.2 C) 97.7 F (36.5 C)  SpO2: 100% 100%    Intake/Output Summary (Last 24 hours) at 09/08/2023 0949 Last data filed at 09/08/2023 0500 Gross per 24 hour  Intake 280 ml  Output 450 ml  Net -170 ml   Filed Weights   09/05/23 0121  Weight: 58.2 kg   Weight change:  Body mass index is 22.02 kg/m.   Physical Exam: General exam: Pleasant, elderly Caucasian female.  Alert, awake, hard of hearing but talking.  Comfortable I did not do a detailed examination because of comfort care status  Data Review: I have personally reviewed the laboratory data and studies available.  F/u labs ordered Unresulted Labs (From admission, onward)    None       Signed, Hoyt Macleod, MD [facility] 09/08/2023

## 2023-09-08 NOTE — Discharge Summary (Signed)
 Physician Discharge Summary  [name] [name] ZOX:[id number] DOB: Jul 31, 1927 DOA: 09/04/2023  PCP: Rodney Clamp, MD  Admit date: 09/04/2023 Discharge date: 09/08/2023  Admitted From: SNF Discharge disposition: [facility]  Recommendations at discharge:  Per hospice policy    Brief narrative: [name] [name] is a 89 y.o. female with PMH significant for thyroid  cancer, postsurgical hypothyroidism, hypoparathyroidism chronic diastolic CHF, HLD. Patient was recently hospitalized 5/5-5/13 from home for severe hypocalcemia secondary to postsurgical hypoparathyroidism.  She was given IV and oral calciums replacement.  PT recommended SNF and hence discharge to short-term rehab at [facility].  Per family, patient's physical strength has not improved significantly and hence they were again process of Medicaid application with a plan of long-term nursing care.  5/27, patient was noted to have shortness of breath and low oxygen saturation at 80%.  EMS started her on supplemental oxygen and brought to the ED.  In the ED, patient was afebrile, hemodynamically stable, required 2 L oxygen by nasal cannula Labs showed CBC unremarkable, BMP with sodium 134, BUN/creatinine 44/1.68, BNP elevated >4500, troponin elevated at 31 Chest x-ray showed cardiomegaly, small to moderate bilateral pleural effusion. Patient was started on broad-spectrum IV antibiotics Given 1 dose of IV Lasix  Admitted to [facility] Echo showed EF less than 20%. Palliative care consultation was obtained. Family made a choice to make her comfort care/hospice.  Subjective: Patient was seen and examined this morning. Propped up in bed.  Alert, awake, ate her breakfast. Multiple family members at bedside.  Hospital course: Comfort care status Initially admitted for acute exacerbation of CHF EF less than 20%. Palliative care consultation obtained.  Family chose comfort care route Cardiac meds stopped. Comfort care order set  utilized Remains alert, awake comfortable this morning To discharge to residential hospice placement at [facility].  Postsurgical hypothyroidism and PAD hypothyroidism Okay to allow patient to continue oral thyroxine and calcium  supplements if able to take oral.  Other acute issues CHF, acute respiratory with hypoxia, AKI, CKD   Goals of care   Code Status: Do not attempt resuscitation (DNR) - Comfort care   Consultants: Palliative care Family Communication: Son, daughter and multiple other family members at bedside  Diet:  Diet Order             Diet general           Diet Heart Room service appropriate? Yes; Fluid consistency: Thin  Diet effective now                   Nutritional status:  Body mass index is 22.02 kg/m.       Wounds:  - Pressure Injury 08/13/23 Buttocks Left Stage 2 -  Partial thickness loss of dermis presenting as a shallow open injury with a red, pink wound bed without slough. (Active)  Date First Assessed/Time First Assessed: 08/13/23 2030   Location: Buttocks  Location Orientation: Left  Staging: Stage 2 -  Partial thickness loss of dermis presenting as a shallow open injury with a red, pink wound bed without slough.  Present on Ad...    Assessments 08/13/2023  8:30 PM 09/05/2023  9:40 PM  Dressing Type Foam - Lift dressing to assess site every shift --  Dressing Clean, Dry, Intact Intact  Dressing Change Frequency PRN --  State of Healing Early/partial granulation --  Site / Wound Assessment Purple;Pale --  % Wound base Red or Granulating 20% --  % Wound base Yellow/Fibrinous Exudate 40% --  % Wound  base Other/Granulation Tissue (Comment) 40% --  Wound Length (cm) 1 cm --  Wound Width (cm) 0.5 cm --  Wound Depth (cm) 2 cm --  Wound Surface Area (cm^2) 0.5 cm^2 --  Wound Volume (cm^3) 1 cm^3 --  Margins Unattached edges (unapproximated) --  Drainage Amount None --  Treatment Cleansed;Off loading --     Inactive Orders  Date Order  Priority Status Authorizing Provider  08/14/23 0143 Foam dressing Routine Discontinued Davida Espy, MD     Wound / Incision (Open or Dehisced) 09/03/23 Skin tear Pretibial Left (Active)  Date First Assessed/Time First Assessed: 09/03/23 0630   Wound Type: Skin tear  Location: Pretibial  Location Orientation: Left  Present on Admission: Yes    Assessments 09/05/2023  6:30 AM 09/05/2023  9:40 PM  Dressing Type Foam - Lift dressing to assess site every shift --  Dressing Changed New --  Dressing Status -- Intact  Site / Wound Assessment Red --  Drainage Amount None --  Treatment Cleansed --     No associated orders.    Discharge Exam:   Vitals:   09/06/23 1500 09/06/23 2012 09/07/23 1514 09/08/23 0400  BP: 132/69 119/74 (!) 125/91 121/84  Pulse: 67 63 66 (!) 54  Resp: 18 15  20   Temp: 97.6 F (36.4 C) 97.9 F (36.6 C) (!) 97.1 F (36.2 C) 97.7 F (36.5 C)  TempSrc: Axillary   Oral  SpO2: 100% 100% 100% 100%  Weight:      Height:        Body mass index is 22.02 kg/m.   General exam: Pleasant, elderly Caucasian female.  Alert, awake, hard of hearing but talking.  Comfortable I did not do a detailed examination because of comfort care status  Follow ups:    Follow-up Information     Rodney Clamp, MD Follow up.   Specialty: Family Medicine Contact information: [address] [place] [address] [phone]                 Discharge Instructions:   Discharge Instructions     Activity as tolerated - No restrictions   Complete by: As directed    Call MD for:   Complete by: As directed    Please get in touch with hospice MD/nurse for any symptom control.   Diet general   Complete by: As directed    If patient is alert and awake enough to eat, can allow luxury feeding.   Discharge instructions   Complete by: As directed    Medicines intended for comfort and pain management prescribed. Rest of the care per hospice policy.   Discharge wound  care:   Complete by: As directed        Discharge Medications:   Allergies as of 09/08/2023       Reactions   Verapamil  Other (See Comments)   Edema        Medication List     STOP taking these medications    calcitRIOL  0.5 MCG capsule Commonly known as: ROCALTROL    calcium  citrate 950 (200 Ca) MG tablet Commonly known as: CALCITRATE - dosed in mg elemental calcium    docusate sodium  100 MG capsule Commonly known as: COLACE   furosemide  10 MG/ML injection Commonly known as: LASIX    furosemide  20 MG tablet Commonly known as: LASIX    spironolactone 25 MG tablet Commonly known as: ALDACTONE       TAKE these medications    acetaminophen  325 MG tablet  Commonly known as: TYLENOL  Take 2 tablets (650 mg total) by mouth every 6 (six) hours as needed for mild pain (pain score 1-3) or fever (or Fever >/= 101). What changed: reasons to take this   gabapentin  100 MG capsule Commonly known as: NEURONTIN  Take 1 capsule (100 mg total) by mouth as needed. What changed: reasons to take this   glycopyrrolate  1 MG tablet Commonly known as: ROBINUL  Take 1 tablet (1 mg total) by mouth 2 (two) times daily.   ipratropium-albuterol  0.5-2.5 (3) MG/3ML Soln Commonly known as: DUONEB Take 3 mLs by nebulization every 4 (four) hours as needed (FOR SHORTNESS OF BREATH OR WHEEZING).   levothyroxine  100 MCG tablet Commonly known as: SYNTHROID  Take 1 tablet (100 mcg total) by mouth daily at 6 (six) AM.   LORazepam  0.5 MG tablet Commonly known as: ATIVAN  Take 0.5 mg by mouth every 6 (six) hours as needed for anxiety (or agitation).   morphine  CONCENTRATE 10 mg / 0.5 ml concentrated solution Take 0.5 mLs (10 mg total) by mouth every 2 (two) hours as needed for moderate pain (pain score 4-6), severe pain (pain score 7-10), anxiety or shortness of breath.   morphine  CONCENTRATE 10 mg / 0.5 ml concentrated solution Take 0.25 mLs (5 mg total) by mouth every 2 (two) hours as needed  for moderate pain (pain score 4-6) (or dyspnea).   morphine  CONCENTRATE 10 mg / 0.5 ml concentrated solution Place 0.25 mLs (5 mg total) under the tongue every 2 (two) hours as needed for moderate pain (pain score 4-6) (or dyspnea).   OXYGEN Inhale 2 L/min into the lungs continuous.   triamcinolone  ointment 0.5 % Commonly known as: KENALOG  Apply 1 Application topically 2 (two) times daily. as needed to affected area. What changed:  when to take this reasons to take this additional instructions   valACYclovir  1000 MG tablet Commonly known as: VALTREX  Take 1 tablet (1,000 mg total) by mouth daily. Take 1 tablet daily for 5 days as needed for outbreaks What changed:  when to take this additional instructions               Discharge Care Instructions  (From admission, onward)           Start     Ordered   09/08/23 0000  Discharge wound care:        09/08/23 1205             The results of significant diagnostics from this hospitalization (including imaging, microbiology, ancillary and laboratory) are listed below for reference.    Procedures and Diagnostic Studies:   VAS US  LOWER EXTREMITY VENOUS (DVT) Result Date: 09/05/2023  Lower Venous DVT Study Patient Name:  [name] [name]  Date of Exam:   09/05/2023 Medical Rec #: [id number]       Accession #:    [id number] Date of Birth: July 12, 1927       Patient Gender: F Patient Age:   89 years Exam [facility] Procedure:      VAS US  LOWER EXTREMITY VENOUS (DVT) Referring Phys: TIMOTHY OPYD --------------------------------------------------------------------------------  Indications: Swelling.  Risk Factors: None identified. Limitations: Poor ultrasound/tissue interface, bandages, open wound and patient positioning, patient immobility. Comparison Study: No prior studies. Performing Technologist: Lerry Ransom RVT  Examination Guidelines: A complete evaluation includes B-mode imaging, spectral Doppler,  color Doppler, and power Doppler as needed of all accessible portions of each vessel. Bilateral testing is considered an integral part of a complete examination.  Limited examinations for reoccurring indications may be performed as noted. The reflux portion of the exam is performed with the patient in reverse Trendelenburg.  +---------+---------------+---------+-----------+----------+-------------------+ RIGHT    CompressibilityPhasicitySpontaneityPropertiesThrombus Aging      +---------+---------------+---------+-----------+----------+-------------------+ CFV      Full           Yes      Yes                                      +---------+---------------+---------+-----------+----------+-------------------+ SFJ      Full                                                             +---------+---------------+---------+-----------+----------+-------------------+ FV Prox  Full                                                             +---------+---------------+---------+-----------+----------+-------------------+ FV Mid   Full                                                             +---------+---------------+---------+-----------+----------+-------------------+ FV DistalFull                                                             +---------+---------------+---------+-----------+----------+-------------------+ PFV      Full                                                             +---------+---------------+---------+-----------+----------+-------------------+ POP      Full           Yes      Yes                                      +---------+---------------+---------+-----------+----------+-------------------+ PTV      Full                                                             +---------+---------------+---------+-----------+----------+-------------------+ PERO  Not well visualized  +---------+---------------+---------+-----------+----------+-------------------+   +---------+---------------+---------+-----------+----------+-------------------+ LEFT     CompressibilityPhasicitySpontaneityPropertiesThrombus Aging      +---------+---------------+---------+-----------+----------+-------------------+ CFV      Full           Yes      Yes                                      +---------+---------------+---------+-----------+----------+-------------------+ SFJ      Full                                                             +---------+---------------+---------+-----------+----------+-------------------+ FV Prox  Full                                                             +---------+---------------+---------+-----------+----------+-------------------+ FV Mid   Full                                                             +---------+---------------+---------+-----------+----------+-------------------+ FV DistalFull                                                             +---------+---------------+---------+-----------+----------+-------------------+ PFV      Full                                                             +---------+---------------+---------+-----------+----------+-------------------+ POP      Full           Yes      Yes                                      +---------+---------------+---------+-----------+----------+-------------------+ PTV      Full                                                             +---------+---------------+---------+-----------+----------+-------------------+ PERO                                                  Not well visualized +---------+---------------+---------+-----------+----------+-------------------+  Summary: RIGHT: - There is no evidence of deep vein thrombosis in the lower extremity. However, portions of this examination were limited- see technologist  comments above.  - No cystic structure found in the popliteal fossa.  LEFT: - There is no evidence of deep vein thrombosis in the lower extremity. However, portions of this examination were limited- see technologist comments above.  - No cystic structure found in the popliteal fossa.  *See table(s) above for measurements and observations. Electronically signed by Runell Countryman on 09/05/2023 at 4:06:36 PM.    Final    ECHOCARDIOGRAM COMPLETE Result Date: 09/05/2023    ECHOCARDIOGRAM REPORT   Patient Name:   [name] [name] Date of Exam: 09/05/2023 Medical Rec #:  [id number]      Height:       64.0 in Accession #:    [id number]     Weight:       128.3 lb Date of Birth:  12/02/1927      BSA:          1.620 m Patient Age:    89 years       BP:           135/105 mmHg Patient Gender: F              HR:           55 bpm. Exam Location:  Inpatient Procedure: 2D Echo, Cardiac Doppler, Color Doppler and Intracardiac            Opacification Agent (Both Spectral and Color Flow Doppler were            utilized during procedure). Indications:    I50.40* Unspecified combined systolic (congestive) and diastolic                 (congestive) heart failure  History:        Patient has prior history of Echocardiogram examinations, most                 recent 10/16/2018. Signs/Symptoms:Shortness of Breath, Dyspnea and                 Altered Mental Status; Risk Factors:Hypertension and                 Dyslipidemia. Pulmonary embolus. Cancer.  Sonographer:    Raynelle Callow RDCS Referring Phys: [id number] TIMOTHY S OPYD IMPRESSIONS  1. Severe global hypokinesis with apical akineiss. Left ventricular ejection fraction, by estimation, is <20%. Left ventricular ejection fraction by 2D MOD biplane is 19.4 %. The left ventricle has severely decreased function. The left ventricle demonstrates global hypokinesis. There is mild concentric left ventricular hypertrophy. Left ventricular diastolic parameters are consistent with Grade II diastolic dysfunction  (pseudonormalization).  2. Right ventricular systolic function is mildly reduced. The right ventricular size is normal. There is mildly elevated pulmonary artery systolic pressure. The estimated right ventricular systolic pressure is 44.8 mmHg.  3. Left atrial size was severely dilated.  4. Right atrial size was mildly dilated.  5. Large pleural effusion in the left lateral region.  6. The mitral valve is normal in structure. Moderate to severe mitral valve regurgitation. No evidence of mitral stenosis. Moderate mitral annular calcification.  7. Tricuspid valve regurgitation is moderate.  8. There is minimal excursion of the right coronary cusp. The left and non-coronary cusps are nearly fused. Aortic valve gradient and velocity likely underestimate the severeity of aortic stenosis due to low flow. Dimensionless index suggests at least moderate  aortic stenosis. The aortic valve is calcified. There is moderate calcification of the aortic valve. There is moderate thickening of the aortic valve. Aortic valve regurgitation is mild to moderate. Aortic valve sclerosis/calcification is present, without any evidence of aortic stenosis. Aortic valve area, by VTI measures 1.48 cm. Aortic valve mean gradient measures 7.0 mmHg. Aortic valve Vmax measures 1.86 m/s.  9. The inferior vena cava is dilated in size with <50% respiratory variability, suggesting right atrial pressure of 15 mmHg. Comparison(s): The left ventricular function is significantly worse. FINDINGS  Left Ventricle: Severe global hypokinesis with apical akineiss. Left ventricular ejection fraction, by estimation, is <20%. Left ventricular ejection fraction by 2D MOD biplane is 19.4 %. The left ventricle has severely decreased function. The left ventricle demonstrates global hypokinesis. Definity  contrast agent was given IV to delineate the left ventricular endocardial borders. The left ventricular internal cavity size was normal in size. There is mild concentric  left ventricular hypertrophy. Left ventricular diastolic parameters are consistent with Grade II diastolic dysfunction (pseudonormalization). Right Ventricle: The right ventricular size is normal. No increase in right ventricular wall thickness. Right ventricular systolic function is mildly reduced. There is mildly elevated pulmonary artery systolic pressure. The tricuspid regurgitant velocity  is 2.73 m/s, and with an assumed right atrial pressure of 15 mmHg, the estimated right ventricular systolic pressure is 44.8 mmHg. Left Atrium: Left atrial size was severely dilated. Right Atrium: Right atrial size was mildly dilated. Pericardium: There is no evidence of pericardial effusion. Mitral Valve: The mitral valve is normal in structure. Moderate mitral annular calcification. Moderate to severe mitral valve regurgitation. No evidence of mitral valve stenosis. MV peak gradient, 8.0 mmHg. The mean mitral valve gradient is 2.5 mmHg. Tricuspid Valve: The tricuspid valve is normal in structure. Tricuspid valve regurgitation is moderate . No evidence of tricuspid stenosis. Aortic Valve: There is minimal excursion of the right coronary cusp. The left and non-coronary cusps are nearly fused. Aortic valve gradient and velocity likely underestimate the severeity of aortic stenosis due to low flow. Dimensionless index suggests at least moderate aortic stenosis. The aortic valve is calcified. There is moderate calcification of the aortic valve. There is moderate thickening of the aortic valve. Aortic valve regurgitation is mild to moderate. Aortic regurgitation PHT measures 359  msec. Aortic valve sclerosis/calcification is present, without any evidence of aortic stenosis. Aortic valve mean gradient measures 7.0 mmHg. Aortic valve peak gradient measures 13.8 mmHg. Aortic valve area, by VTI measures 1.48 cm. Pulmonic Valve: The pulmonic valve was normal in structure. Pulmonic valve regurgitation is mild. No evidence of pulmonic  stenosis. Aorta: The aortic root is normal in size and structure. Venous: The inferior vena cava is dilated in size with less than 50% respiratory variability, suggesting right atrial pressure of 15 mmHg. IAS/Shunts: No atrial level shunt detected by color flow Doppler. Additional Comments: There is a large pleural effusion in the left lateral region.  LEFT VENTRICLE PLAX 2D                        Biplane EF (MOD) LVIDd:         5.50 cm         LV Biplane EF:   Left LVIDs:         5.30 cm                          ventricular LV PW:  1.20 cm                          ejection LV IVS:        1.10 cm                          fraction by LVOT diam:     2.10 cm                          2D MOD LV SV:         42                               biplane is LV SV Index:   26                               19.4 %. LVOT Area:     3.46 cm                                Diastology                                LV e' medial:  2.00 cm/s LV Volumes (MOD)               LV e' lateral: 5.06 cm/s LV vol d, MOD    225.0 ml A2C: LV vol d, MOD    176.0 ml A4C: LV vol s, MOD    180.0 ml A2C: LV vol s, MOD    152.0 ml A4C: LV SV MOD A2C:   45.0 ml LV SV MOD A4C:   176.0 ml LV SV MOD BP:    40.4 ml RIGHT VENTRICLE            IVC RV S prime:     5.25 cm/s  IVC diam: 2.70 cm TAPSE (M-mode): 0.7 cm LEFT ATRIUM             Index        RIGHT ATRIUM           Index LA diam:        3.40 cm 2.10 cm/m   RA Area:     17.50 cm LA Vol (A2C):   63.3 ml 39.07 ml/m  RA Volume:   47.30 ml  29.20 ml/m LA Vol (A4C):   71.7 ml 44.26 ml/m LA Biplane Vol: 67.8 ml 41.85 ml/m  AORTIC VALVE                     PULMONIC VALVE AV Area (Vmax):    1.48 cm      PR End Diast Vel: 1.93 msec AV Area (Vmean):   1.42 cm AV Area (VTI):     1.48 cm AV Vmax:           185.67 cm/s AV Vmean:          123.000 cm/s AV VTI:            0.288 m AV Peak Grad:      13.8 mmHg AV Mean Grad:      7.0 mmHg LVOT Vmax:  79.60 cm/s LVOT Vmean:        50.350 cm/s LVOT VTI:           0.123 m LVOT/AV VTI ratio: 0.43 AI PHT:            359 msec  AORTA Ao Root diam: 3.20 cm Ao Asc diam:  3.60 cm MITRAL VALVE                  TRICUSPID VALVE MV Area VTI:  1.12 cm        TR Peak grad:   29.8 mmHg MV Peak grad: 8.0 mmHg        TR Vmax:        273.00 cm/s MV Mean grad: 2.5 mmHg MV Vmax:      1.42 m/s        SHUNTS MV Vmean:     73.5 cm/s       Systemic VTI:  0.12 m MR Peak grad:    89.1 mmHg    Systemic Diam: 2.10 cm MR Mean grad:    57.0 mmHg MR Vmax:         472.00 cm/s MR Vmean:        349.0 cm/s MR PISA:         3.08 cm MR PISA Eff ROA: 25 mm MR PISA Radius:  0.70 cm Maudine Sos MD Electronically signed by Maudine Sos MD Signature Date/Time: 09/05/2023/11:19:37 AM    Final    CT ABDOMEN PELVIS WO CONTRAST Result Date: 09/04/2023 CLINICAL DATA:  Abdominal pain, acute, nonlocalized EXAM: CT ABDOMEN AND PELVIS WITHOUT CONTRAST TECHNIQUE: Multidetector CT imaging of the abdomen and pelvis was performed following the standard protocol without IV contrast. RADIATION DOSE REDUCTION: This exam was performed according to the departmental dose-optimization program which includes automated exposure control, adjustment of the mA and/or kV according to patient size and/or use of iterative reconstruction technique. COMPARISON:  Aug 15, 2023, FINDINGS: Motion degraded study. Of note, the lack of intravenous contrast limits evaluation of the solid organ parenchyma and vascularity. Lower chest: For findings above the diaphragm, please see the separately dictated CT of the chest report, which was performed concurrently. Hepatobiliary: No mass.Distended gallbladder containing a moderate amount of biliary sludge and nonradiopaque stones. No intrahepatic or extrahepatic biliary ductal dilation. Pancreas: No mass or main ductal dilation. No peripancreatic inflammation or fluid collection. Spleen: Normal size. No mass. Adrenals/Urinary Tract: No adrenal masses. 1 cm exophytic cyst along the left  lower pole. No hydronephrosis or nephrolithiasis. Circumferential wall thickening of the urinary bladder. A few bladder diverticula are also noted posteriorly. Stomach/Bowel: The stomach is decompressed without focal abnormality. No small bowel wall thickening or inflammation. No small bowel obstruction.Normal appendix. Total colonic diverticulosis. No changes of acute diverticulitis. Vascular/Lymphatic: No aortic aneurysm. Diffuse aortoiliac atherosclerosis. No intraabdominal or pelvic lymphadenopathy. Reproductive: Hysterectomy. No concerning adnexal mass. Small volume layering free fluid in the pelvis. Other: No pneumoperitoneum. Small volume perihepatic and perisplenic ascites. Musculoskeletal: No acute fracture or destructive lesion. Diffuse anasarca. Osteopenia. Multilevel degenerative disc disease of the spine. Mild bilateral hip osteoarthritis. IMPRESSION: 1. Circumferential wall thickening of the urinary bladder, which may be due to underdistension. If there is concern for acute cystitis, correlation with urinalysis would be recommended. 2. Small volume perihepatic and perisplenic ascites with diffuse anasarca, likely related to patient's volume status. 3. Distended gallbladder containing a moderate amount of biliary sludge and non radiopaque stones. No changes to suggest acute cholecystitis. 4. Total colonic diverticulosis. No  changes of acute diverticulitis. Electronically Signed   By: Rance Burrows M.D.   On: 09/04/2023 19:06   CT Head Wo Contrast Result Date: 09/04/2023 CLINICAL DATA:  Mental status change, unknown cause EXAM: CT HEAD WITHOUT CONTRAST TECHNIQUE: Contiguous axial images were obtained from the base of the skull through the vertex without intravenous contrast. RADIATION DOSE REDUCTION: This exam was performed according to the departmental dose-optimization program which includes automated exposure control, adjustment of the mA and/or kV according to patient size and/or use of  iterative reconstruction technique. COMPARISON:  CT head 12/25/2022., CT head 04/24/2021 FINDINGS: Brain: Patchy and confluent areas of decreased attenuation are noted throughout the deep and periventricular white matter of the cerebral hemispheres bilaterally, compatible with chronic microvascular ischemic disease. No evidence of large-territorial acute infarction. No parenchymal hemorrhage. No mass lesion. No extra-axial collection. No mass effect or midline shift. No hydrocephalus. Basilar cisterns are patent. Vascular: No hyperdense vessel. Patchy and confluent areas of decreased attenuation are noted throughout the deep and periventricular white matter of the cerebral hemispheres bilaterally, compatible with chronic microvascular ischemic disease. Skull: No acute fracture or focal lesion. Sinuses/Orbits: Paranasal sinuses and mastoid air cells are clear. The orbits are unremarkable. Other: None. IMPRESSION: No acute intracranial abnormality. Electronically Signed   By: Morgane  Naveau M.D.   On: 09/04/2023 18:59   CT CHEST WO CONTRAST Result Date: 09/04/2023 CLINICAL DATA:  Pneumonia, complication suspected, xray done EXAM: CT CHEST WITHOUT CONTRAST TECHNIQUE: Multidetector CT imaging of the chest was performed following the standard protocol without IV contrast. RADIATION DOSE REDUCTION: This exam was performed according to the departmental dose-optimization program which includes automated exposure control, adjustment of the mA and/or kV according to patient size and/or use of iterative reconstruction technique. COMPARISON:  February 20, 2022 FINDINGS: Cardiovascular: Severe cardiomegaly. Dense multi-vessel coronary atherosclerosis. Small pericardial effusion. Aortic valve and mitral annular calcification. No aortic aneurysm. Diffuse calcified atherosclerosis throughout the aorta. Mediastinum/Nodes: No mediastinal mass. Thyroidectomy. No mediastinal, hilar, or axillary lymphadenopathy. Lungs/Pleura: The  midline trachea and bronchi are patent. Biapical pleuroparenchymal scarring. Subtle ground-glass airspace opacities dependently with interlobular septal thickening in both upper lobes. Small to moderate volume bilateral pleural effusions with bibasilar airspace consolidation, more so on the right than the left. The left pleural effusion appears at least partially loculated with fluid noted in the oblique fissure. The airspace consolidation on the right demonstrates air bronchograms. No pneumothorax. Musculoskeletal: No acute fracture or destructive bone lesion. Osteopenia. Multilevel degenerative disc disease of the spine. Moderate bilateral glenohumeral joint osteoarthritis. Upper Abdomen: For findings below the diaphragm, please see the separately dictated CT of the abdomen and pelvis report, which was performed concurrently. IMPRESSION: 1. Severe cardiomegaly with findings of mild pulmonary edema. 2. Moderate volume bilateral pleural effusions with bibasilar airspace consolidation. While this likely represents bibasilar compressive atelectasis, the right lower lobe consolidation contains air bronchograms, which can be seen in underlying bronchopneumonia, in the correct clinical context. 3. For findings below the diaphragm, please see the separately dictated CT of the abdomen and pelvis report, which was performed concurrently. Aortic Atherosclerosis (ICD10-I70.0). Electronically Signed   By: Rance Burrows M.D.   On: 09/04/2023 18:58   DG Chest Port 1 View Result Date: 09/04/2023 CLINICAL DATA:  Shortness of breath starting this morning. EXAM: PORTABLE CHEST 1 VIEW COMPARISON:  03/07/2023 FINDINGS: Prominent and increased enlargement of the cardiopericardial silhouette. Pericardial effusion is not excluded. Atherosclerotic calcification of the aortic arch. Blunting of both costophrenic angles favoring small to moderate  bilateral pleural effusions. Mildly indistinct pulmonary vasculature may reflect pulmonary  venous hypertension. Clips project over the lower neck, query thyroidectomy. Advanced degenerative glenohumeral arthropathy bilaterally. IMPRESSION: 1. Prominent and increased enlargement of the cardiopericardial silhouette. This could be from cardiomegaly or pericardial effusion. Correlate with any clinical signs of tamponade such as Beck's triad. Echocardiography or chest CT could be utilized to further work up the progressive enlargement of the cardiopericardial silhouette. 2. Small to moderate bilateral pleural effusions. 3. Mildly indistinct pulmonary vasculature may reflect pulmonary venous hypertension. 4. Advanced degenerative glenohumeral arthropathy bilaterally. Electronically Signed   By: Freida Jes M.D.   On: 09/04/2023 17:43     Labs:   Basic Metabolic Panel: Recent Labs  Lab 09/04/23 1612 09/04/23 1654 09/05/23 0412  NA 134* 136 137  K 4.6 4.6 4.5  CL 101 103 104  CO2 21*  --  18*  GLUCOSE 143* 139* 133*  BUN 44* 39* 47*  CREATININE 1.68* 1.80* 1.76*  CALCIUM  9.5  --  9.2  MG  --   --  2.1   GFR Estimated Creatinine Clearance: 17.2 mL/min (A) (by C-G formula based on SCr of 1.76 mg/dL (H)). Liver Function Tests: Recent Labs  Lab 09/04/23 1612  AST 64*  ALT 59*  ALKPHOS 70  BILITOT 1.1  PROT 6.9  ALBUMIN 3.3*   No results for input(s): "LIPASE", "AMYLASE" in the last 168 hours. No results for input(s): "AMMONIA" in the last 168 hours. Coagulation profile No results for input(s): "INR", "PROTIME" in the last 168 hours.  CBC: Recent Labs  Lab 09/04/23 1612 09/04/23 1654 09/05/23 0412  WBC 5.5  --  6.1  NEUTROABS 3.8  --   --   HGB 12.1 13.3 12.0  HCT 37.5 39.0 39.1  MCV 91.9  --  93.5  PLT 259  --  248   Cardiac Enzymes: No results for input(s): "CKTOTAL", "CKMB", "CKMBINDEX", "TROPONINI" in the last 168 hours. BNP: Invalid input(s): "POCBNP" CBG: No results for input(s): "GLUCAP" in the last 168 hours. D-Dimer No results for input(s):  "DDIMER" in the last 72 hours. Hgb A1c No results for input(s): "HGBA1C" in the last 72 hours. Lipid Profile No results for input(s): "CHOL", "HDL", "LDLCALC", "TRIG", "CHOLHDL", "LDLDIRECT" in the last 72 hours. Thyroid  function studies No results for input(s): "TSH", "T4TOTAL", "T3FREE", "THYROIDAB" in the last 72 hours.  Invalid input(s): "FREET3" Anemia work up No results for input(s): "VITAMINB12", "FOLATE", "FERRITIN", "TIBC", "IRON", "RETICCTPCT" in the last 72 hours. Microbiology Recent Results (from the past 240 hours)  Culture, blood (routine x 2)     Status: None (Preliminary result)   Collection Time: 09/04/23  7:43 PM   Specimen: BLOOD  Result Value Ref Range Status   Specimen Description   Final    BLOOD LEFT ANTECUBITAL Performed at [facility], [address], [place], [place] [address]    Special Requests   Final    BOTTLES DRAWN AEROBIC AND ANAEROBIC Blood Culture results may not be optimal due to an inadequate volume of blood received in culture bottles Performed at [facility], [address], [place], [place] [address]    Culture   Final    NO GROWTH 4 DAYS Performed at [facility], [address], [place], [place] [address]    Report Status PENDING  Incomplete  Culture, blood (routine x 2)     Status: None (Preliminary result)   Collection Time: 09/04/23  7:43 PM  Specimen: BLOOD LEFT FOREARM  Result Value Ref Range Status   Specimen Description   Final    BLOOD LEFT FOREARM Performed at [facility], [address] [place], [place] [address]    Special Requests   Final    BOTTLES DRAWN AEROBIC AND ANAEROBIC Blood Culture adequate volume Performed at [facility], [address], [place], [place] [address]    Culture   Final    NO GROWTH 4 DAYS Performed at [facility], [address], [place], [place] [address]    Report Status PENDING  Incomplete    Time  coordinating discharge: 25 minutes  Signed: Dai Apel  [facility] 09/08/2023, 12:05 PM

## 2023-09-08 NOTE — Progress Notes (Signed)
 [facility] 2W15 [facility] Liaison Note  Bed offered at [facility] in [place] for today. Family agreeable to transfer today. TOC aware. RN please call report to [phone] prior to patient leaving the unit. Please send signed DNR with patient at discharge.   Thank you for allowing us  to participate in this patient's care.  Ardine Beckwith, LPN [facility] Liaison [phone]

## 2023-09-09 LAB — CULTURE, BLOOD (ROUTINE X 2)
Culture: NO GROWTH
Culture: NO GROWTH
Special Requests: ADEQUATE

## 2023-10-09 DEATH — deceased

## 2023-10-26 ENCOUNTER — Encounter: Payer: Self-pay | Admitting: Advanced Practice Midwife

## 2023-12-24 ENCOUNTER — Ambulatory Visit: Admitting: Internal Medicine
